# Patient Record
Sex: Male | Born: 1950 | Race: White | Hispanic: No | Marital: Married | Smoking: Former smoker
Health system: Southern US, Community
[De-identification: ages and names within clinical notes are randomized; demographics above are authoritative.]

## PROBLEM LIST (undated history)

## (undated) DIAGNOSIS — T8859XA Other complications of anesthesia, initial encounter: Secondary | ICD-10-CM

## (undated) DIAGNOSIS — E785 Hyperlipidemia, unspecified: Secondary | ICD-10-CM

## (undated) DIAGNOSIS — I4891 Unspecified atrial fibrillation: Secondary | ICD-10-CM

## (undated) DIAGNOSIS — L400 Psoriasis vulgaris: Secondary | ICD-10-CM

## (undated) DIAGNOSIS — C801 Malignant (primary) neoplasm, unspecified: Secondary | ICD-10-CM

## (undated) DIAGNOSIS — R972 Elevated prostate specific antigen [PSA]: Secondary | ICD-10-CM

## (undated) DIAGNOSIS — G4730 Sleep apnea, unspecified: Secondary | ICD-10-CM

## (undated) DIAGNOSIS — N9989 Other postprocedural complications and disorders of genitourinary system: Secondary | ICD-10-CM

## (undated) DIAGNOSIS — R338 Other retention of urine: Secondary | ICD-10-CM

## (undated) DIAGNOSIS — D649 Anemia, unspecified: Secondary | ICD-10-CM

## (undated) DIAGNOSIS — T4145XA Adverse effect of unspecified anesthetic, initial encounter: Secondary | ICD-10-CM

## (undated) DIAGNOSIS — K219 Gastro-esophageal reflux disease without esophagitis: Secondary | ICD-10-CM

## (undated) DIAGNOSIS — I10 Essential (primary) hypertension: Secondary | ICD-10-CM

## (undated) DIAGNOSIS — I499 Cardiac arrhythmia, unspecified: Secondary | ICD-10-CM

## (undated) DIAGNOSIS — M1990 Unspecified osteoarthritis, unspecified site: Secondary | ICD-10-CM

## (undated) HISTORY — PX: LEG SURGERY: SHX1003

## (undated) HISTORY — PX: HEMORRHOID SURGERY: SHX153

## (undated) HISTORY — PX: PROSTATE BIOPSY: SHX241

## (undated) HISTORY — PX: ELBOW SURGERY: SHX618

## (undated) HISTORY — DX: Essential (primary) hypertension: I10

## (undated) HISTORY — DX: Hyperlipidemia, unspecified: E78.5

## (undated) HISTORY — DX: Psoriasis vulgaris: L40.0

## (undated) HISTORY — PX: KNEE SURGERY: SHX244

## (undated) HISTORY — DX: Sleep apnea, unspecified: G47.30

---
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
## 1999-05-19 ENCOUNTER — Encounter: Payer: Self-pay | Admitting: General Surgery

## 1999-05-22 ENCOUNTER — Observation Stay (HOSPITAL_COMMUNITY): Admission: RE | Admit: 1999-05-22 | Discharge: 1999-05-25 | Payer: Self-pay | Admitting: General Surgery

## 2003-05-29 ENCOUNTER — Emergency Department (HOSPITAL_COMMUNITY): Admission: EM | Admit: 2003-05-29 | Discharge: 2003-05-29 | Payer: Self-pay | Admitting: Emergency Medicine

## 2011-03-28 ENCOUNTER — Ambulatory Visit (INDEPENDENT_AMBULATORY_CARE_PROVIDER_SITE_OTHER): Payer: BC Managed Care – PPO

## 2011-03-28 DIAGNOSIS — J069 Acute upper respiratory infection, unspecified: Secondary | ICD-10-CM

## 2012-06-18 ENCOUNTER — Ambulatory Visit (INDEPENDENT_AMBULATORY_CARE_PROVIDER_SITE_OTHER): Payer: BC Managed Care – PPO | Admitting: Internal Medicine

## 2012-06-18 VITALS — BP 133/86 | HR 98 | Temp 98.3°F | Resp 18 | Ht 68.5 in | Wt 192.0 lb

## 2012-06-18 DIAGNOSIS — R6883 Chills (without fever): Secondary | ICD-10-CM

## 2012-06-18 DIAGNOSIS — J09X2 Influenza due to identified novel influenza A virus with other respiratory manifestations: Secondary | ICD-10-CM

## 2012-06-18 DIAGNOSIS — R52 Pain, unspecified: Secondary | ICD-10-CM

## 2012-06-18 DIAGNOSIS — I10 Essential (primary) hypertension: Secondary | ICD-10-CM | POA: Insufficient documentation

## 2012-06-18 LAB — POCT INFLUENZA A/B
Influenza A, POC: POSITIVE
Influenza B, POC: NEGATIVE

## 2012-06-18 MED ORDER — HYDROCODONE-ACETAMINOPHEN 7.5-325 MG/15ML PO SOLN: 5.0000 mL | Freq: Four times a day (QID) | ORAL | Status: DC | PRN

## 2012-06-18 MED ORDER — OSELTAMIVIR PHOSPHATE 75 MG PO CAPS: 75.0000 mg | ORAL_CAPSULE | Freq: Two times a day (BID) | ORAL | Status: DC

## 2012-06-18 NOTE — Progress Notes (Signed)
  Subjective:    Patient ID: Philip Jackson, male    DOB: 06-03-1950, 62 y.o.   MRN: [id number]  HPI Sudden onset of fever, body aches, painful cough. Has had flu shot. No sputum production yet. No sob, does have cp with cough.   Review of Systems HTN, lipid disorder    Objective:   Physical Exam  Vitals reviewed. Constitutional: He is oriented to person, place, and time. He appears well-developed and well-nourished. No distress.  HENT:  Right Ear: External ear normal. Tympanic membrane is retracted. No decreased hearing is noted.  Left Ear: External ear normal. Tympanic membrane is not retracted. No decreased hearing is noted.  Nose: Mucosal edema and rhinorrhea present. Right sinus exhibits no maxillary sinus tenderness and no frontal sinus tenderness. Left sinus exhibits no maxillary sinus tenderness and no frontal sinus tenderness.  Mouth/Throat: Oropharynx is clear and moist.  Cardiovascular: Regular rhythm and normal heart sounds.  Tachycardia present.   Pulmonary/Chest: Effort normal and breath sounds normal. No respiratory distress. He has no wheezes. He has no rales. He exhibits no tenderness.  Neurological: He is alert and oriented to person, place, and time. No cranial nerve deficit. He exhibits normal muscle tone. Coordination normal.  Psychiatric: He has a normal mood and affect.   Results for orders placed in visit on 06/18/12  POCT INFLUENZA A/B      Result Value Range   Influenza A, POC Positive     Influenza B, POC Negative           Assessment & Plan:  Tamiflu/Lortab elix

## 2012-06-18 NOTE — Patient Instructions (Addendum)
Influenza, Adult Influenza ("the flu") is a viral infection of the respiratory tract. It occurs more often in winter months because people spend more time in close contact with one another. Influenza can make you feel very sick. Influenza easily spreads from person to person (contagious). CAUSES   Influenza is caused by a virus that infects the respiratory tract. You can catch the virus by breathing in droplets from an infected person's cough or sneeze. You can also catch the virus by touching something that was recently contaminated with the virus and then touching your mouth, nose, or eyes. SYMPTOMS   Symptoms typically last 4 to 10 days and may include:  Fever.   Chills.   Headache, body aches, and muscle aches.   Sore throat.   Chest discomfort and cough.   Poor appetite.   Weakness or feeling tired.   Dizziness.   Nausea or vomiting.  DIAGNOSIS   Diagnosis of influenza is often made based on your history and a physical exam. A nose or throat swab test can be done to confirm the diagnosis. RISKS AND COMPLICATIONS You may be at risk for a more severe case of influenza if you smoke cigarettes, have diabetes, have chronic heart disease (such as heart failure) or lung disease (such as asthma), or if you have a weakened immune system. Elderly people and pregnant women are also at risk for more serious infections. The most common complication of influenza is a lung infection (pneumonia). Sometimes, this complication can require emergency medical care and may be life-threatening. PREVENTION   An annual influenza vaccination (flu shot) is the best way to avoid getting influenza. An annual flu shot is now routinely recommended for all adults in the [place] TREATMENT   In mild cases, influenza goes away on its own. Treatment is directed at relieving symptoms. For more severe cases, your caregiver may prescribe antiviral medicines to shorten the sickness. Antibiotic medicines are not effective,  because the infection is caused by a virus, not by bacteria. HOME CARE INSTRUCTIONS  Only take over-the-counter or prescription medicines for pain, discomfort, or fever as directed by your caregiver.   Use a cool mist humidifier to make breathing easier.   Get plenty of rest until your temperature returns to normal. This usually takes 3 to 4 days.   Drink enough fluids to keep your urine clear or pale yellow.   Cover your mouth and nose when coughing or sneezing, and wash your hands well to avoid spreading the virus.   Stay home from work or school until your fever has been gone for at least 1 full day.  SEEK MEDICAL CARE IF:    You have chest pain or a deep cough that worsens or produces more mucus.   You have nausea, vomiting, or diarrhea.  SEEK IMMEDIATE MEDICAL CARE IF:    You have difficulty breathing, shortness of breath, or your skin or nails turn bluish.   You have severe neck pain or stiffness.   You have a severe headache, facial pain, or earache.   You have a worsening or recurring fever.   You have nausea or vomiting that cannot be controlled.  MAKE SURE YOU:  Understand these instructions.   Will watch your condition.   Will get help right away if you are not doing well or get worse.  Document Released: 04/02/2000 Document Revised: 10/05/2011 Document Reviewed: 07/05/2011 [employer] Patient Information 2013 [employer].    

## 2012-11-30 ENCOUNTER — Ambulatory Visit (INDEPENDENT_AMBULATORY_CARE_PROVIDER_SITE_OTHER): Payer: BC Managed Care – PPO | Admitting: Internal Medicine

## 2012-11-30 ENCOUNTER — Ambulatory Visit: Payer: BC Managed Care – PPO

## 2012-11-30 VITALS — BP 124/72 | HR 64 | Temp 98.0°F | Resp 18 | Ht 69.0 in | Wt 193.0 lb

## 2012-11-30 DIAGNOSIS — M25551 Pain in right hip: Secondary | ICD-10-CM

## 2012-11-30 DIAGNOSIS — M25559 Pain in unspecified hip: Secondary | ICD-10-CM

## 2012-11-30 MED ORDER — METHOCARBAMOL 750 MG PO TABS: 750.0000 mg | ORAL_TABLET | Freq: Four times a day (QID) | ORAL | Status: DC

## 2012-11-30 NOTE — Patient Instructions (Signed)
Hip Pain The hips join the upper legs to the lower pelvis. The bones, cartilage, tendons, and muscles of the hip joint perform a lot of work each day holding your body weight and allowing you to move around. Hip pain is a common symptom. It can range from a minor ache to severe pain on 1 or both hips. Pain may be felt on the inside of the hip joint near the groin, or the outside near the buttocks and upper thigh. There may be swelling or stiffness as well. It occurs more often when a person walks or performs activity. There are many reasons hip pain can develop. CAUSES  It is important to work with your caregiver to identify the cause since many conditions can impact the bones, cartilage, muscles, and tendons of the hips. Causes for hip pain include:  Broken (fractured) bones.  Separation of the thighbone from the hip socket (dislocation).  Torn cartilage of the hip joint.  Swelling (inflammation) of a tendon (tendonitis), the sac within the hip joint (bursitis), or a joint.  A weakening in the abdominal wall (hernia), affecting the nerves to the hip.  Arthritis in the hip joint or lining of the hip joint.  Pinched nerves in the back, hip, or upper thigh.  A bulging disc in the spine (herniated disc).  Rarely, bone infection or cancer. DIAGNOSIS  The location of your hip pain will help your caregiver understand what may be causing the pain. A diagnosis is based on your medical history, your symptoms, results from your physical exam, and results from diagnostic tests. Diagnostic tests may include X-ray exams, a computerized magnetic scan (magnetic resonance imaging, MRI), or bone scan. TREATMENT  Treatment will depend on the cause of your hip pain. Treatment may include:  Limiting activities and resting until symptoms improve.  Crutches or other walking supports (a cane or brace).  Ice, elevation, and compression.  Physical therapy or home exercises.  Shoe inserts or special  shoes.  Losing weight.  Medications to reduce pain.  Undergoing surgery. HOME CARE INSTRUCTIONS   Only take over-the-counter or prescription medicines for pain, discomfort, or fever as directed by your caregiver.  Put ice on the injured area:  Put ice in a plastic bag.  Place a towel between your skin and the bag.  Leave the ice on for 15-20 minutes at a time, 3-4 times a day.  Keep your leg raised (elevated) when possible to lessen swelling.  Avoid activities that cause pain.  Follow specific exercises as directed by your caregiver.  Sleep with a pillow between your legs on your most comfortable side.  Record how often you have hip pain, the location of the pain, and what it feels like. This information may be helpful to you and your caregiver.  Ask your caregiver about returning to work or sports and whether you should drive.  Follow up with your caregiver for further exams, therapy, or testing as directed. SEEK MEDICAL CARE IF:   Your pain or swelling continues or worsens after 1 week.  You are feeling unwell or have chills.  You have increasing difficulty with walking.  You have a loss of sensation or other new symptoms.  You have questions or concerns. SEEK IMMEDIATE MEDICAL CARE IF:   You cannot put weight on the affected hip.  You have fallen.  You have a sudden increase in pain and swelling in your hip.  You have a fever. MAKE SURE YOU:   Understand these instructions.  Will watch your condition.  Will get help right away if you are not doing well or get worse. Document Released: 09/23/2009 Document Revised: 06/28/2011 Document Reviewed: 09/23/2009 [facility] Patient Information 2014 [place], [place]. Hip Pointer (Iliac Crest Contusion) Direct hit (trauma) to the hip bone in the front and side of the body (ilium) may cause hip pointer. This is also called iliac crest contusion. Hip pointer is bruising (contusion) of the skin and underlying tissues of  the hip bone. A bruise results from injury to blood vessels, which bleed into the surrounding soft tissues. This gives a "black and blue" look. Since the upper hip bone has little protective covering, it is vulnerable to injury. SYMPTOMS   Swelling, pain, and tenderness of the hip bone.  Pain, often worse the day after injury.  Feeling of firmness when pressure is placed on the injury site.  Discoloration under the skin (redness, turning to "black and blue" or purplish color).  Sometimes, pain with walking or inability to walk. CAUSES  Direct force (trauma) to the hip bone. This may result from collision with another player or the playing surface (i.e. grass, court, ice, sideboard). RISK INCREASES WITH:  Contact or collision sports (i.e. football, hockey, soccer).  Poor protection of exposed areas, during contact or collision sports.  Bleeding disorder or use of blood thinners (anticoagulants), aspirin, or non-steroidal anti-inflammatory medicines (aspirin and ibuprofen). PREVENTION   Wear properly fitted and padded protective equipment.  Limit use of blood thinners, aspirin, and ibuprofen. PROGNOSIS  Hip pointer usually goes away within 2 weeks, with proper treatment. RELATED COMPLICATIONS   Too much bleeding, leading to prolonged impairment.  Infection (uncommon).  Hip stiffness.  Delayed healing or resolution of symptoms, especially if activity is resumed too soon.  Inflammation of a sac between the bone and tendon (bursitis). TREATMENT  Treatment first involves ice and medicine to reduce pain and inflammation. Heat, massage, and physical therapy should be delayed for about 48 hours after injury. Rest, and avoid further injury, to allow the tissue to heal. Physical therapy may be advised. This can be competed at home or with a therapist. Once you return to activity, you may want to add padding in the area, to prevent injury. On rare occasions, contusion will require surgery  or aspiration (removal of fluids with a needle). MEDICATION   If pain medicine is needed, nonsteroidal anti-inflammatory medicines (aspirin and ibuprofen), or other minor pain relievers (acetaminophen), are often advised.  Do not take pain medicine within 48 hours of injury or for 7 days before surgery.  Prescription pain relievers may be given. Use only as directed and only as much as you need.  Ointments applied to the skin may help.  Corticosteroid injections may be given to reduce inflammation, but are not common for this injury. HEAT AND COLD  Cold treatment (icing) should be applied for 10 to 15 minutes every 2 to 3 hours for inflammation and pain, and immediately after activity that aggravates your symptoms. Use ice packs or an ice massage.  Heat treatment may be used before performing stretching and strengthening activities prescribed by your caregiver, physical therapist, or athletic trainer. Use a heat pack or a warm water soak. SEEK MEDICAL CARE IF:   Symptoms get worse or do not improve in 2 weeks, despite treatment.  New, unexplained symptoms develop. (Drugs used in treatment may produce side effects.) Document Released: 04/05/2005 Document Revised: 06/28/2011 Document Reviewed: 07/18/2008 [facility] Patient Information 2014 [place], [place].

## 2012-11-30 NOTE — Progress Notes (Signed)
  Subjective:    Patient ID: Philip Jackson, male    DOB: 01-26-1951, 62 y.o.   MRN: [id number]  HPI Climbing out of truck and felt sharp pain right hip/pelvis, no other trauma.Pain is localized superficial and not deep. Able to walk but right leg will give way with the pain. No distal numbness, weakness or incontinence. No urinary sxs or fever.   Review of Systems     Objective:   Physical Exam  Vitals reviewed. Constitutional: He is oriented to person, place, and time. He appears well-developed and well-nourished. He appears distressed.  HENT:  Head: Normocephalic.  Eyes: EOM are normal.  Neck: Neck supple.  Cardiovascular: Normal rate.   Pulmonary/Chest: Effort normal.  Musculoskeletal: He exhibits tenderness.       Right hip: He exhibits tenderness and bony tenderness. He exhibits normal range of motion, normal strength, no swelling, no crepitus, no deformity and no laceration.       Legs: Neurological: He is alert and oriented to person, place, and time. He exhibits normal muscle tone. Coordination normal.    [facility] reading (PRIMARY) by  Dr Perrin Maltese no injury noted        Assessment & Plan:  RICE Robaxin

## 2014-06-05 ENCOUNTER — Other Ambulatory Visit: Payer: Self-pay | Admitting: Gastroenterology

## 2014-06-07 ENCOUNTER — Other Ambulatory Visit: Payer: Self-pay | Admitting: Gastroenterology

## 2014-06-11 ENCOUNTER — Encounter (HOSPITAL_COMMUNITY): Payer: Self-pay | Admitting: *Deleted

## 2014-06-17 ENCOUNTER — Encounter (HOSPITAL_COMMUNITY)
Admission: RE | Disposition: A | Discharge status: Home / Self Care | Payer: Self-pay | Source: Ambulatory Visit | Attending: Gastroenterology

## 2014-06-17 ENCOUNTER — Emergency Department (HOSPITAL_COMMUNITY): Payer: BLUE CROSS/BLUE SHIELD

## 2014-06-17 ENCOUNTER — Ambulatory Visit (HOSPITAL_COMMUNITY): Payer: BLUE CROSS/BLUE SHIELD | Admitting: Anesthesiology

## 2014-06-17 ENCOUNTER — Emergency Department (HOSPITAL_COMMUNITY)
Admission: EM | Admit: 2014-06-17 | Discharge: 2014-06-17 | Disposition: A | Discharge status: Home / Self Care | Payer: BLUE CROSS/BLUE SHIELD | Attending: Emergency Medicine | Admitting: Emergency Medicine

## 2014-06-17 ENCOUNTER — Telehealth: Payer: Self-pay | Admitting: Interventional Cardiology

## 2014-06-17 ENCOUNTER — Encounter (HOSPITAL_COMMUNITY): Payer: Self-pay | Admitting: *Deleted

## 2014-06-17 ENCOUNTER — Emergency Department (HOSPITAL_COMMUNITY)
Admission: EM | Admit: 2014-06-17 | Discharge: 2014-06-17 | Discharge status: Absent without leave | Payer: BLUE CROSS/BLUE SHIELD

## 2014-06-17 ENCOUNTER — Ambulatory Visit (HOSPITAL_COMMUNITY)
Admission: RE | Admit: 2014-06-17 | Discharge: 2014-06-17 | Disposition: A | Discharge status: Home / Self Care | Payer: BLUE CROSS/BLUE SHIELD | Source: Ambulatory Visit | Attending: Gastroenterology | Admitting: Gastroenterology

## 2014-06-17 ENCOUNTER — Other Ambulatory Visit: Payer: Self-pay | Admitting: Physician Assistant

## 2014-06-17 DIAGNOSIS — Z79899 Other long term (current) drug therapy: Secondary | ICD-10-CM | POA: Insufficient documentation

## 2014-06-17 DIAGNOSIS — K219 Gastro-esophageal reflux disease without esophagitis: Secondary | ICD-10-CM | POA: Diagnosis not present

## 2014-06-17 DIAGNOSIS — Z87820 Personal history of traumatic brain injury: Secondary | ICD-10-CM | POA: Insufficient documentation

## 2014-06-17 DIAGNOSIS — I10 Essential (primary) hypertension: Secondary | ICD-10-CM | POA: Diagnosis not present

## 2014-06-17 DIAGNOSIS — I4891 Unspecified atrial fibrillation: Secondary | ICD-10-CM

## 2014-06-17 DIAGNOSIS — Z008 Encounter for other general examination: Secondary | ICD-10-CM

## 2014-06-17 DIAGNOSIS — I480 Paroxysmal atrial fibrillation: Secondary | ICD-10-CM

## 2014-06-17 DIAGNOSIS — Z7982 Long term (current) use of aspirin: Secondary | ICD-10-CM | POA: Insufficient documentation

## 2014-06-17 DIAGNOSIS — D649 Anemia, unspecified: Secondary | ICD-10-CM | POA: Diagnosis not present

## 2014-06-17 DIAGNOSIS — Z0000 Encounter for general adult medical examination without abnormal findings: Secondary | ICD-10-CM | POA: Insufficient documentation

## 2014-06-17 DIAGNOSIS — D509 Iron deficiency anemia, unspecified: Secondary | ICD-10-CM | POA: Insufficient documentation

## 2014-06-17 DIAGNOSIS — Z538 Procedure and treatment not carried out for other reasons: Secondary | ICD-10-CM | POA: Diagnosis not present

## 2014-06-17 DIAGNOSIS — E780 Pure hypercholesterolemia: Secondary | ICD-10-CM | POA: Insufficient documentation

## 2014-06-17 DIAGNOSIS — I471 Supraventricular tachycardia: Secondary | ICD-10-CM | POA: Diagnosis present

## 2014-06-17 DIAGNOSIS — R000 Tachycardia, unspecified: Secondary | ICD-10-CM | POA: Diagnosis present

## 2014-06-17 DIAGNOSIS — Z87891 Personal history of nicotine dependence: Secondary | ICD-10-CM | POA: Insufficient documentation

## 2014-06-17 DIAGNOSIS — E785 Hyperlipidemia, unspecified: Secondary | ICD-10-CM | POA: Diagnosis present

## 2014-06-17 HISTORY — DX: Gastro-esophageal reflux disease without esophagitis: K21.9

## 2014-06-17 HISTORY — DX: Anemia, unspecified: D64.9

## 2014-06-17 LAB — CBC WITH DIFFERENTIAL/PLATELET
BASOS ABS: 0 10*3/uL (ref 0.0–0.1)
BASOS PCT: 0 % (ref 0–1)
Eosinophils Absolute: 0.1 10*3/uL (ref 0.0–0.7)
Eosinophils Relative: 2 % (ref 0–5)
HCT: 44.8 % (ref 39.0–52.0)
HEMOGLOBIN: 14.2 g/dL (ref 13.0–17.0)
LYMPHS PCT: 26 % (ref 12–46)
Lymphs Abs: 2 10*3/uL (ref 0.7–4.0)
MCH: 25.9 pg — ABNORMAL LOW (ref 26.0–34.0)
MCHC: 31.7 g/dL (ref 30.0–36.0)
MCV: 81.6 fL (ref 78.0–100.0)
Monocytes Absolute: 0.8 10*3/uL (ref 0.1–1.0)
Monocytes Relative: 11 % (ref 3–12)
NEUTROS ABS: 4.7 10*3/uL (ref 1.7–7.7)
NEUTROS PCT: 61 % (ref 43–77)
PLATELETS: 287 10*3/uL (ref 150–400)
RBC: 5.49 MIL/uL (ref 4.22–5.81)
RDW: 19.8 % — AB (ref 11.5–15.5)
WBC: 7.7 10*3/uL (ref 4.0–10.5)

## 2014-06-17 LAB — BASIC METABOLIC PANEL
ANION GAP: 9 (ref 5–15)
BUN: 13 mg/dL (ref 6–23)
CALCIUM: 8.8 mg/dL (ref 8.4–10.5)
CO2: 20 mmol/L (ref 19–32)
Chloride: 109 mmol/L (ref 96–112)
Creatinine, Ser: 0.9 mg/dL (ref 0.50–1.35)
GFR calc Af Amer: >90 mL/min (ref >90)
GFR calc non Af Amer: 89 mL/min — ABNORMAL LOW (ref >90)
GLUCOSE: 107 mg/dL — AB (ref 70–99)
POTASSIUM: 3.9 mmol/L (ref 3.5–5.1)
Sodium: 138 mmol/L (ref 135–145)

## 2014-06-17 LAB — I-STAT TROPONIN, ED: Troponin i, poc: 0.01 ng/mL (ref 0.00–0.08)

## 2014-06-17 SURGERY — Surgical Case

## 2014-06-17 MED ORDER — ASPIRIN 81 MG PO CHEW: 81.0000 mg | CHEWABLE_TABLET | Freq: Every day | ORAL | Status: DC

## 2014-06-17 MED ORDER — DILTIAZEM HCL ER COATED BEADS 120 MG PO CP24
120.0000 mg | ORAL_CAPSULE | Freq: Once | ORAL | Status: AC
  Administered 2014-06-17: 120 mg via ORAL
  Filled 2014-06-17: qty 1

## 2014-06-17 MED ORDER — DILTIAZEM HCL 100 MG IV SOLR
5.0000 mg/h | INTRAVENOUS | Status: DC
  Administered 2014-06-17: 5 mg/h via INTRAVENOUS
  Filled 2014-06-17: qty 100

## 2014-06-17 MED ORDER — DILTIAZEM HCL ER COATED BEADS 240 MG PO CP24: 240.0000 mg | ORAL_CAPSULE | Freq: Every day | ORAL | Status: DC

## 2014-06-17 MED ORDER — DILTIAZEM HCL ER COATED BEADS 120 MG PO CP24: 240.0000 mg | ORAL_CAPSULE | Freq: Every day | ORAL | Status: DC

## 2014-06-17 MED ORDER — APIXABAN 5 MG PO TABS
5.0000 mg | ORAL_TABLET | Freq: Two times a day (BID) | ORAL | Status: DC
  Filled 2014-06-17 (×2): qty 1

## 2014-06-17 MED ORDER — PROPOFOL 10 MG/ML IV BOLUS
INTRAVENOUS | Status: AC
  Filled 2014-06-17: qty 20

## 2014-06-17 MED ORDER — LACTATED RINGERS IV SOLN: INTRAVENOUS | Status: DC

## 2014-06-17 MED ORDER — PROPOFOL 10 MG/ML IV BOLUS
INTRAVENOUS | Status: AC
  Filled 2014-06-17: qty 60

## 2014-06-17 MED ORDER — DILTIAZEM HCL 60 MG PO TABS
120.0000 mg | ORAL_TABLET | Freq: Once | ORAL | Status: AC
Start: 2014-06-17 — End: 2014-06-17
  Administered 2014-06-17: 120 mg via ORAL
  Filled 2014-06-17: qty 2

## 2014-06-17 MED ORDER — PROPOFOL 10 MG/ML IV BOLUS
INTRAVENOUS | Status: AC
  Filled 2014-06-17: qty 40

## 2014-06-17 MED ORDER — SODIUM CHLORIDE 0.9 % IV SOLN: INTRAVENOUS | Status: DC

## 2014-06-17 MED ORDER — SODIUM CHLORIDE 0.9 % IV SOLN
Freq: Once | INTRAVENOUS | Status: AC
  Administered 2014-06-17: 500 mL via INTRAVENOUS

## 2014-06-17 MED ORDER — DILTIAZEM LOAD VIA INFUSION
20.0000 mg | Freq: Once | INTRAVENOUS | Status: AC
  Administered 2014-06-17: 20 mg via INTRAVENOUS
  Filled 2014-06-17: qty 20

## 2014-06-17 MED ORDER — DILTIAZEM HCL ER COATED BEADS 120 MG PO CP24: 120.0000 mg | ORAL_CAPSULE | Freq: Every day | ORAL | Status: DC

## 2014-06-17 SURGICAL SUPPLY — 24 items

## 2014-06-17 NOTE — Progress Notes (Signed)
Problem: New onset, asymptomatic atrial fibrillation.  History: The patient was scheduled to undergo a diagnostic esophagogastroduodenoscopy and colonoscopy this morning to evaluate iron deficiency anemia (hemoglobin 12.4 g, serum ferritin 13.3 ng/mL).  He was placed on the cardiac monitor which showed atrial fibrillation. There is no past history of cardiac disease or atrial fibrillation. Despite being in atrial fibrillation, the patient felt well.  Diagnostic esophagogastroduodenoscopy and colonoscopy were canceled.  The patient was referred to the [facility] emergency department for evaluation of his new onset atrial fibrillation.

## 2014-06-17 NOTE — Telephone Encounter (Signed)
New onset atrial fibrillation of unknown duration in [facility]. Converted spontaneously. Needs 30 day monitor, 2 D doppler echo, and sleep study scheduled.

## 2014-06-17 NOTE — Telephone Encounter (Signed)
Pt echo and monitor previously scheduled. Order in [place] for sleep study, message sent to [facility] to initiate scheduling

## 2014-06-17 NOTE — ED Notes (Signed)
Patient was in endoscopy for a scheduled colonoscopy and was found to be tachycardic and in atrial fib when he was placed on the monitor. He was then sent from endoscopy to the ED.

## 2014-06-17 NOTE — Discharge Instructions (Signed)
Please take your new medications as directed: 1. Diltiazem 240 mg daily 2. Baby Aspirin 81 mg daily  Please follow-up with cardiology ASAP.  Call them today.  Atrial Fibrillation Atrial fibrillation is a type of irregular heart rhythm (arrhythmia). During atrial fibrillation, the upper chambers of the heart (atria) quiver continuously in a chaotic pattern. This causes an irregular and often rapid heart rate.  Atrial fibrillation is the result of the heart becoming overloaded with disorganized signals that tell it to beat. These signals are normally released one at a time by a part of the right atrium called the sinoatrial node. They then travel from the atria to the lower chambers of the heart (ventricles), causing the atria and ventricles to contract and pump blood as they pass. In atrial fibrillation, parts of the atria outside of the sinoatrial node also release these signals. This results in two problems. First, the atria receive so many signals that they do not have time to fully contract. Second, the ventricles, which can only receive one signal at a time, beat irregularly and out of rhythm with the atria.  There are three types of atrial fibrillation:   Paroxysmal. Paroxysmal atrial fibrillation starts suddenly and stops on its own within a week.  Persistent. Persistent atrial fibrillation lasts for more than a week. It may stop on its own or with treatment.  Permanent. Permanent atrial fibrillation does not go away. Episodes of atrial fibrillation may lead to permanent atrial fibrillation. Atrial fibrillation can prevent your heart from pumping blood normally. It increases your risk of stroke and can lead to heart failure.  CAUSES   Heart conditions, including a heart attack, heart failure, coronary artery disease, and heart valve conditions.   Inflammation of the sac that surrounds the heart (pericarditis).  Blockage of an artery in the lungs (pulmonary embolism).  Pneumonia or  other infections.  Chronic lung disease.  Thyroid problems, especially if the thyroid is overactive (hyperthyroidism).  Caffeine, excessive alcohol use, and use of some illegal drugs.   Use of some medicines, including certain decongestants and diet pills.  Heart surgery.   Birth defects.  Sometimes, no cause can be found. When this happens, the atrial fibrillation is called lone atrial fibrillation. The risk of complications from atrial fibrillation increases if you have lone atrial fibrillation and you are age 38 years or older. RISK FACTORS  Heart failure.  Coronary artery disease.  Diabetes mellitus.   High blood pressure (hypertension).   Obesity.   Other arrhythmias.   Increased age. SIGNS AND SYMPTOMS   A feeling that your heart is beating rapidly or irregularly.   A feeling of discomfort or pain in your chest.   Shortness of breath.   Sudden light-headedness or weakness.   Getting tired easily when exercising.   Urinating more often than normal (mainly when atrial fibrillation first begins).  In paroxysmal atrial fibrillation, symptoms may start and suddenly stop. DIAGNOSIS  Your health care provider may be able to detect atrial fibrillation when taking your pulse. Your health care provider may have you take a test called an ambulatory electrocardiogram (ECG). An ECG records your heartbeat patterns over a 24-hour period. You may also have other tests, such as:  Transthoracic echocardiogram (TTE). During echocardiography, sound waves are used to evaluate how blood flows through your heart.  Transesophageal echocardiogram (TEE).  Stress test. There is more than one type of stress test. If a stress test is needed, ask your health care provider about which type  is best for you.  Chest X-ray exam.  Blood tests.  Computed tomography (CT). TREATMENT  Treatment may include:  Treating any underlying conditions. For example, if you have an  overactive thyroid, treating the condition may correct atrial fibrillation.  Taking medicine. Medicines may be given to control a rapid heart rate or to prevent blood clots, heart failure, or a stroke.  Having a procedure to correct the rhythm of the heart:  Electrical cardioversion. During electrical cardioversion, a controlled, low-energy shock is delivered to the heart through your skin. If you have chest pain, very low blood pressure, or sudden heart failure, this procedure may need to be done as an emergency.  Catheter ablation. During this procedure, heart tissues that send the signals that cause atrial fibrillation are destroyed.  Surgical ablation. During this surgery, thin lines of heart tissue that carry the abnormal signals are destroyed. This procedure can either be an open-heart surgery or a minimally invasive surgery. With the minimally invasive surgery, small cuts are made to access the heart instead of a large opening.  Pulmonary venous isolation. During this surgery, tissue around the veins that carry blood from the lungs (pulmonary veins) is destroyed. This tissue is thought to carry the abnormal signals. HOME CARE INSTRUCTIONS   Take medicines only as directed by your health care provider. Some medicines can make atrial fibrillation worse or recur.  If blood thinners were prescribed by your health care provider, take them exactly as directed. Too much blood-thinning medicine can cause bleeding. If you take too little, you will not have the needed protection against stroke and other problems.  Perform blood tests at home if directed by your health care provider. Perform blood tests exactly as directed.  Quit smoking if you smoke.  Do not drink alcohol.  Do not drink caffeinated beverages such as coffee, soda, and some teas. You may drink decaffeinated coffee, soda, or tea.   Maintain a healthy weight.Do not use diet pills unless your health care provider approves. They  may make heart problems worse.   Follow diet instructions as directed by your health care provider.  Exercise regularly as directed by your health care provider.  Keep all follow-up visits as directed by your health care provider. This is important. PREVENTION  The following substances can cause atrial fibrillation to recur:   Caffeinated beverages.  Alcohol.  Certain medicines, especially those used for breathing problems.  Certain herbs and herbal medicines, such as those containing ephedra or ginseng.  Illegal drugs, such as cocaine and amphetamines. Sometimes medicines are given to prevent atrial fibrillation from recurring. Proper treatment of any underlying condition is also important in helping prevent recurrence.  SEEK MEDICAL CARE IF:  You notice a change in the rate, rhythm, or strength of your heartbeat.  You suddenly begin urinating more frequently.  You tire more easily when exerting yourself or exercising. SEEK IMMEDIATE MEDICAL CARE IF:   You have chest pain, abdominal pain, sweating, or weakness.  You feel nauseous.  You have shortness of breath.  You suddenly have swollen feet and ankles.  You feel dizzy.  Your face or limbs feel numb or weak.  You have a change in your vision or speech. MAKE SURE YOU:   Understand these instructions.  Will watch your condition.  Will get help right away if you are not doing well or get worse. Document Released: 04/05/2005 Document Revised: 08/20/2013 Document Reviewed: 05/16/2012 [facility] Patient Information 2015 [place], [place]. This information is not intended to  replace advice given to you by your health care provider. Make sure you discuss any questions you have with your health care provider. ° °

## 2014-06-17 NOTE — Progress Notes (Signed)
Patient in atrial fibrillation procedure canceled,taken to ed for evaluation

## 2014-06-17 NOTE — H&P (Signed)
  Problem: Iron deficiency anemia. Hemoglobin 12.4 g. Ferritin 13.3 ng/mL. Chronic aspirin use. Frequent blood donation to the [facility]. Normal screening colonoscopy performed on 11/26/2005  History: The patient is a 64 year old male born [birth year]. He is scheduled to undergo diagnostic esophagogastroduodenoscopy and colonoscopy to evaluate iron deficiency anemia based on a slightly low hemoglobin and low serum ferritin. He underwent a normal screening colonoscopy on 11/26/2005. He is a regular donated her of blood to the [facility]. He chronically takes aspirin 81 mg daily. He has chronic heartburn treated with omeprazole with good success. He reports no dysphagia or odynophagia. He denies gastrointestinal bleeding.  Chronic medications: Aspirin. Amlodipine. Vesicare. Pravastatin. Omeprazole. Valsartan. Oral vitamin B 12.  Past medical history: Hypertension. Gastroesophageal reflux. Allergic rhinitis. Hypercholesterolemia. Psoriasis. Urinary bladder spasm. No previous surgery.  Exam: The patient is alert and lying comfortably on the endoscopy stretcher. Abdomen is soft and nontender to palpation. Lungs are clear to auscultation. Cardiac exam reveals a regular rhythm.  Assessment: Iron deficiency anemia secondary to chronic blood donation to the [facility], impaired iron absorption due to chronic omeprazole, and chronic gastrointestinal bleeding secondary to chronic aspirin use.  Plan: Proceed with diagnostic esophagogastroduodenoscopy and colonoscopy.

## 2014-06-17 NOTE — ED Notes (Signed)
Bed: WA16 Expected date:  Expected time:  Means of arrival:  Comments: RES A 

## 2014-06-17 NOTE — ED Notes (Signed)
Patient transported to X-ray 
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
## 2014-06-17 NOTE — ED Notes (Signed)
MD at bedside. 

## 2014-06-17 NOTE — ED Notes (Signed)
Pt sent from endoscopy. Sent for new onset of a fib
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
## 2014-06-20 ENCOUNTER — Encounter: Payer: Self-pay | Admitting: Radiology

## 2014-06-20 ENCOUNTER — Ambulatory Visit (HOSPITAL_COMMUNITY): Payer: BLUE CROSS/BLUE SHIELD | Attending: Internal Medicine | Admitting: Radiology

## 2014-06-20 ENCOUNTER — Encounter (INDEPENDENT_AMBULATORY_CARE_PROVIDER_SITE_OTHER): Payer: BLUE CROSS/BLUE SHIELD

## 2014-06-20 DIAGNOSIS — I4891 Unspecified atrial fibrillation: Secondary | ICD-10-CM

## 2014-06-20 NOTE — Progress Notes (Signed)
Echocardiogram performed.  

## 2014-06-20 NOTE — Progress Notes (Signed)
[employer] 30 day monitor applied. EOS 07-20-14

## 2014-06-24 ENCOUNTER — Telehealth: Payer: Self-pay | Admitting: *Deleted

## 2014-06-24 NOTE — Telephone Encounter (Signed)
[employer] faxed monitor strips for review (06/23/14 11:53 pm, AFib [place]). Called patient to check on his status - he was not at home. Attempted to call his cell phone - [email or web address]. Reviewed by Dr. Rayann Heman. He advised that patient needs to be seen sooner than 08/01/14 appointment. Patient agreeable to come in tomorrow, 06/25/14 @ 1100 am with Roderic Palau, NP.  Will keep 08/01/14 appointment with Dr. Tamala Julian open until patient is seen and treatment plan discussed. Patient currently denies discomfort or complaints.

## 2014-06-24 NOTE — Telephone Encounter (Signed)
Terri @ [facility] has left messages for pt to call back to schedule his sleep study

## 2014-06-25 ENCOUNTER — Ambulatory Visit (INDEPENDENT_AMBULATORY_CARE_PROVIDER_SITE_OTHER): Payer: BLUE CROSS/BLUE SHIELD | Admitting: Nurse Practitioner

## 2014-06-25 ENCOUNTER — Encounter: Payer: Self-pay | Admitting: Nurse Practitioner

## 2014-06-25 VITALS — BP 126/80 | HR 71 | Ht 69.0 in | Wt 198.1 lb

## 2014-06-25 DIAGNOSIS — I4891 Unspecified atrial fibrillation: Secondary | ICD-10-CM

## 2014-06-25 NOTE — Patient Instructions (Signed)
Your physician has recommended that you have a sleep study. This test records several body functions during sleep, including: brain activity, eye movement, oxygen and carbon dioxide blood levels, heart rate and rhythm, breathing rate and rhythm, the flow of air through your mouth and nose, snoring, body muscle movements, and chest and belly movement.  Please schedule for split night sleep study @ [facility] your scheduled appointment with Dr. Tamala Julian and continue all medications as prescribed.  Continue wearing monitor
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
## 2014-06-26 ENCOUNTER — Other Ambulatory Visit: Payer: Self-pay

## 2014-06-26 MED ORDER — DILTIAZEM HCL ER COATED BEADS 240 MG PO CP24: 240.0000 mg | ORAL_CAPSULE | Freq: Every day | ORAL | Status: DC

## 2014-07-09 ENCOUNTER — Encounter: Payer: Self-pay | Admitting: Interventional Cardiology

## 2014-07-30 ENCOUNTER — Telehealth: Payer: Self-pay

## 2014-08-01 ENCOUNTER — Ambulatory Visit (INDEPENDENT_AMBULATORY_CARE_PROVIDER_SITE_OTHER): Payer: BLUE CROSS/BLUE SHIELD | Admitting: Interventional Cardiology

## 2014-08-01 ENCOUNTER — Encounter: Payer: Self-pay | Admitting: Interventional Cardiology

## 2014-08-01 VITALS — BP 170/90 | HR 61 | Ht 68.0 in | Wt 200.1 lb

## 2014-08-01 DIAGNOSIS — I10 Essential (primary) hypertension: Secondary | ICD-10-CM

## 2014-08-01 DIAGNOSIS — E785 Hyperlipidemia, unspecified: Secondary | ICD-10-CM

## 2014-08-01 DIAGNOSIS — I480 Paroxysmal atrial fibrillation: Secondary | ICD-10-CM | POA: Diagnosis not present

## 2014-08-01 MED ORDER — DILTIAZEM HCL ER COATED BEADS 360 MG PO CP24: 360.0000 mg | ORAL_CAPSULE | Freq: Every day | ORAL | Status: DC

## 2014-08-01 NOTE — Patient Instructions (Signed)
Your physician has recommended you make the following change in your medication:  1) INCREASE Diltiazem to 360mg  daily. An Rx has been sent to your pharmacy  Your physician wants you to follow-up in: 3-4 months with Dr.Smith You will receive a reminder letter in the mail two months in advance. If you don't receive a letter, please call our office to schedule the follow-up appointment.
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
## 2014-08-30 ENCOUNTER — Encounter (HOSPITAL_BASED_OUTPATIENT_CLINIC_OR_DEPARTMENT_OTHER): Payer: BLUE CROSS/BLUE SHIELD

## 2014-09-09 ENCOUNTER — Ambulatory Visit (HOSPITAL_BASED_OUTPATIENT_CLINIC_OR_DEPARTMENT_OTHER): Payer: BLUE CROSS/BLUE SHIELD | Attending: Cardiology

## 2014-09-09 VITALS — Ht 68.0 in | Wt 190.0 lb

## 2014-09-09 DIAGNOSIS — I480 Paroxysmal atrial fibrillation: Secondary | ICD-10-CM

## 2014-09-09 DIAGNOSIS — R0683 Snoring: Secondary | ICD-10-CM | POA: Insufficient documentation

## 2014-09-09 DIAGNOSIS — I4891 Unspecified atrial fibrillation: Secondary | ICD-10-CM | POA: Diagnosis present

## 2014-09-09 DIAGNOSIS — G4733 Obstructive sleep apnea (adult) (pediatric): Secondary | ICD-10-CM

## 2014-09-09 DIAGNOSIS — I493 Ventricular premature depolarization: Secondary | ICD-10-CM | POA: Insufficient documentation

## 2014-09-12 NOTE — Telephone Encounter (Signed)
error 

## 2014-09-15 ENCOUNTER — Telehealth: Payer: Self-pay | Admitting: Cardiology

## 2014-09-15 DIAGNOSIS — G4733 Obstructive sleep apnea (adult) (pediatric): Secondary | ICD-10-CM

## 2014-09-15 NOTE — Sleep Study (Addendum)
NAME: Philip Jackson DATE OF BIRTH:  08-15-1950 MEDICAL RECORD NUMBER [id number]  LOCATION: [facility]  PHYSICIAN: Thoma Paulsen R  DATE OF STUDY: 09/09/2014  SLEEP STUDY TYPE: Split Night Nocturnal Polysomnogram with CPAP Titration               REFERRING PHYSICIAN: Sueanne Margarita, MD  INDICATION FOR STUDY: atrial fibrillation, snoring  EPWORTH SLEEPINESS SCORE: 1 HEIGHT: 5\' 8"  (172.7 cm)  WEIGHT: 190 lb (86.183 kg)    Body mass index is 28.9 kg/(m^2).  NECK SIZE: 18 in.  MEDICATIONS: Reviewed in the chart  SLEEP ARCHITECTURE: During the diagnostic portion of the study, the patient slept for a total of 213 minutes out of a total sleep time of 258 minutes.  There was no slow wave sleep or REM sleep.  The onset to sleep latency was prolonged at 25 minutes.  The sleep efficiency was reduced at 74%.  During the CPAP titration, the patient slept for a total of 130 minutes out of a total sleep period of 153 minutes.  There was no slow wave sleep and 23 minutes of REM sleep.  The onset to sleep latency was 28 minutes and the onset to REM sleep latency was 52 minutes.  The sleep efficiency was reduced at 71%.     RESPIRATORY DATA: During the diagnostic portion of the study, there were 25 apneas, of which, 19 were obstructive, 2 were central and 4 were mixed apneas.  There were 90 hypopneas.  The AHI was 32.3 events per hour consistent with severe obstructive sleep apnea/hypopnea syndrome.  Most events occurred in the non supine position during NREM sleep.  There was moderate to severe snoring. The patient was started on CPAP at 5cm H2O and this was titrated for respiratory events and snoring to 18cm H2O.  The AHI was 32 events per hour at 18cm H2O.  The patient was unable to achieve REM supine sleep at optimum pressure without further respiratory events.   OXYGEN DATA: During the diagnostic portion of the study, the lowest oxygen saturation was 86% and the average oxygen  saturation was 91%.  During the CPAP titration, the lowest oxygen saturation was 83% and the average saturation was 92%.  The time spent with oxygen saturations < 88% was 2.8 minutes.    CARDIAC DATA: The patient maintained sinus bradycardia with PVC's during the study.  The average heart rate was 59 bpm and the lowest heart rate was 35-38 bpm.  MOVEMENT/PARASOMNIA: During the diagnostic portion of the study, there were an increased number of limb movements with a PLMS index of 18.8 movements per hour.  During the CPAP titration there were no limb movements.  IMPRESSION/ RECOMMENDATION:   1.  Severe Obstructive Sleep Apnea/Hypopnea syndrome with an AHI of 32.3 events per hour.   Most events occurred in the non supine position during NREM sleep 2.  There was moderate to severe snoring noted. 3.  Reduced sleep efficiency with increased frequency of arousals due to respiratory events. 4.  Abnormal sleep architecture with no slow wave or REM sleep.   5.  PVC's were noted during the study. 6.  Oxygen desaturations associated with respiratory events as low as 85%.   7.  The patient was tried but unsuccessful at adequate titration with CPAP do to continued respiratory events.   8.  Recommend overnight sleep study with BiPAP.  Signed: Sueanne Margarita Diplomate, American Board of Sleep Medicine  ELECTRONICALLY SIGNED ON:  09/15/2014, 8:23  PM [facility] PH: [phone]   FX: [phone] ACCREDITED BY THE AMERICAN ACADEMY OF SLEEP MEDICINE

## 2014-09-15 NOTE — Telephone Encounter (Signed)
Please let patient know that they have sleep apnea were not adequately titrated on CPAP and recommend repeat in lab titration with BiPAP.  Please set up titration in the sleep lab.

## 2014-09-17 ENCOUNTER — Telehealth: Payer: Self-pay | Admitting: *Deleted

## 2014-09-17 NOTE — Telephone Encounter (Signed)
Patient is aware of results and is okay to proceed with BiPAP Titration.

## 2014-09-17 NOTE — Telephone Encounter (Signed)
See phone note below for information.

## 2014-09-17 NOTE — Telephone Encounter (Signed)
Patient scheduled for BiPAP Titration 09/20/14

## 2014-09-17 NOTE — Addendum Note (Signed)
Addended by: Andres Ege on: 09/17/2014 01:28 PM   Modules accepted: Orders

## 2014-09-17 NOTE — Telephone Encounter (Signed)
Left message for patient to call regarding sleep study

## 2014-09-17 NOTE — Telephone Encounter (Signed)
Left message for patient to call back  

## 2014-09-17 NOTE — Telephone Encounter (Signed)
New Message  Pt returning Bethany's phone call.

## 2014-09-20 ENCOUNTER — Ambulatory Visit (HOSPITAL_BASED_OUTPATIENT_CLINIC_OR_DEPARTMENT_OTHER): Payer: BLUE CROSS/BLUE SHIELD | Attending: Cardiology

## 2014-09-20 VITALS — Ht 68.0 in | Wt 190.0 lb

## 2014-09-20 DIAGNOSIS — G4733 Obstructive sleep apnea (adult) (pediatric): Secondary | ICD-10-CM | POA: Diagnosis not present

## 2014-09-20 DIAGNOSIS — I4891 Unspecified atrial fibrillation: Secondary | ICD-10-CM | POA: Diagnosis not present

## 2014-10-05 ENCOUNTER — Telehealth: Payer: Self-pay | Admitting: Cardiology

## 2014-10-05 NOTE — Addendum Note (Signed)
Addended by: Sueanne Margarita on: 10/05/2014 03:18 PM   Modules accepted: Orders

## 2014-10-05 NOTE — Telephone Encounter (Signed)
Pt had successful PAP titration. Please setup appointment in 10 weeks. Please let [facility] know that order for PAP is in EPIC.   

## 2014-10-05 NOTE — Sleep Study (Signed)
   NAME: Philip Jackson DATE OF BIRTH:  10-14-1950 MEDICAL RECORD NUMBER [id number]  LOCATION: [facility]  PHYSICIAN: Azarie Coriz R  DATE OF STUDY: 09/20/2014  SLEEP STUDY TYPE: Positive Airway Pressure Titration               REFERRING PHYSICIAN: Sueanne Margarita, MD  INDICATION FOR STUDY: Severe OSA with AHI 32/hr  EPWORTH SLEEPINESS SCORE:  HEIGHT: 5\' 8"  (172.7 cm)  WEIGHT: 190 lb (86.183 kg)    Body mass index is 28.9 kg/(m^2).  NECK SIZE: 18 in.  MEDICATIONS: reviewed in the chart  SLEEP ARCHITECTURE: The Patient slept for a total of 219 minutes out of a total sleep period time of 354 minutes.  There was no slow wave sleep and 31 minutes of REM sleep.  The onset to sleep latency was 7 minutes and the onset to REM sleep latency was prolonged at 184 minutes.  The sleep efficiency was reduced at 60%.   RESPIRATORY DATA: The patient was started on BiPAP at 8/4cm H2O and titrated for respiratory events and snoring to 19/15cm H2O.  The patient was able to sleep for a prolonged period of time and reach REM supine sleep at an optimum pressure of 17/13cm H2O without any further respiratory events.  The AHI at this pressure was 3.5 events per hour.  There was some snoring noted.    OXYGEN DATA: The average oxygen saturation was 96%.  The lowest oxygen saturation was 87%.  The time spent with oxygen saturations < 88% was 0.3 minutes.  The lowest oxygen saturation on BiPAP at 17/13cm H2O was 91%.    CARDIAC DATA: The patient maintained atrial fibrillation throughout the study.  The average heart rate was 101 bpm and the lowest heart rate was 32 bpm.    MOVEMENT/PARASOMNIA: There were an increased number of periodic limb movements during sleep with a PLMS index of 7.1 events per hour.  There were no REM sleep behavior disorders.  IMPRESSION/ RECOMMENDATION:   1.   Successful BiPAP titration to 17/13cm H2O.  The AHI at this pressure was 3.5 events per hour.   2.  The lowest  oxygen saturation was optimum pressure was 91%.   3.  The patient maintained atrial fibrillation with average heart rate 101 bpm.   4.  Recommend ResMed BiPAP with heated humidity and Bi-flex of 3 with medium [employer] Simplus Full Face Mask set at BiPAP of 17/13cm H2O.   5.  Treatment would also include careful attention to proper sleep hygiene, weight reduction if the BMI is elevated, avoidance of sleeping in the supine position and avoidance of alcohol within four hours of bedtime.  Specific treatment decisions should be tailored to each patient based upon the clinical situation and all treatment options should be considered.  The patient should be instructed to avoid driving if sleepy and careful clinical follow up is needed to ensure that the patient's symptoms are improving with therapy and the PAP adherence is supported and measured if prescribed.    Signed: Sueanne Margarita Diplomate, American Board of Sleep Medicine  ELECTRONICALLY SIGNED ON:  10/05/2014, 3:06 PM [place] PH: [phone]   FX: [phone] [place]

## 2014-10-07 NOTE — Telephone Encounter (Signed)
Patient is aware.  [place] Notified.  When patient gets equipment I will schedule 10 week ov
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
## 2014-11-26 ENCOUNTER — Encounter: Payer: Self-pay | Admitting: Interventional Cardiology

## 2014-11-26 ENCOUNTER — Ambulatory Visit (INDEPENDENT_AMBULATORY_CARE_PROVIDER_SITE_OTHER): Payer: BLUE CROSS/BLUE SHIELD | Admitting: Interventional Cardiology

## 2014-11-26 DIAGNOSIS — I10 Essential (primary) hypertension: Secondary | ICD-10-CM

## 2014-11-26 DIAGNOSIS — I4891 Unspecified atrial fibrillation: Secondary | ICD-10-CM

## 2014-11-26 DIAGNOSIS — E785 Hyperlipidemia, unspecified: Secondary | ICD-10-CM

## 2014-11-26 DIAGNOSIS — G4733 Obstructive sleep apnea (adult) (pediatric): Secondary | ICD-10-CM | POA: Diagnosis not present

## 2014-11-26 NOTE — Patient Instructions (Addendum)
Medication Instructions:  Your physician recommends that you continue on your current medications as directed. Please refer to the Current Medication list given to you today.  Continue Diltiazem 360mg  daily  Labwork: None   Testing/Procedures: Your physician has requested that you have an exercise tolerance test. For further information please visit [email or web address]. Please also follow instruction sheet, as given.   Your physician has recommended that you wear a holter monitor. Holter monitors are medical devices that record the heart's electrical activity. Doctors most often use these monitors to diagnose arrhythmias. Arrhythmias are problems with the speed or rhythm of the heartbeat. The monitor is a small, portable device. You can wear one while you do your normal daily activities. This is usually used to diagnose what is causing palpitations/syncope (passing out).   Follow-Up: Your physician wants you to follow-up in: 6 months with Dr.Smith You will receive a reminder letter in the mail two months in advance. If you don't receive a letter, please call our office to schedule the follow-up appointment.   Any Other Special Instructions Will Be Listed Below (If Applicable).

## 2014-12-02 ENCOUNTER — Ambulatory Visit: Payer: BLUE CROSS/BLUE SHIELD

## 2014-12-11 ENCOUNTER — Ambulatory Visit (INDEPENDENT_AMBULATORY_CARE_PROVIDER_SITE_OTHER): Payer: BLUE CROSS/BLUE SHIELD

## 2014-12-11 DIAGNOSIS — I4891 Unspecified atrial fibrillation: Secondary | ICD-10-CM

## 2014-12-19 ENCOUNTER — Telehealth: Payer: Self-pay

## 2014-12-19 NOTE — Telephone Encounter (Signed)
Pt aware of monitor results. Two hours of atrial fib with RVR. Will need anticoagulation when he turns 65. I need to see him around his birthday in [birth year]. Pt has no questions at this time. Pt verbalized understanding.

## 2014-12-19 NOTE — Telephone Encounter (Signed)
-----   Message from Belva Crome, MD sent at 12/18/2014  8:49 PM EDT ----- Two hours of atrial fib with RVR. Will need anticoagulation when he turns 65. I need to see him around his birthday in [birth year].

## 2015-01-07 ENCOUNTER — Ambulatory Visit (INDEPENDENT_AMBULATORY_CARE_PROVIDER_SITE_OTHER): Payer: BLUE CROSS/BLUE SHIELD

## 2015-01-07 ENCOUNTER — Encounter: Payer: BLUE CROSS/BLUE SHIELD | Admitting: Nurse Practitioner

## 2015-01-07 DIAGNOSIS — I4891 Unspecified atrial fibrillation: Secondary | ICD-10-CM

## 2015-01-07 DIAGNOSIS — I10 Essential (primary) hypertension: Secondary | ICD-10-CM | POA: Diagnosis not present

## 2015-01-07 LAB — EXERCISE TOLERANCE TEST
CHL CUP MPHR: 156 {beats}/min
CSEPED: 4 min
CSEPEDS: 22 s
CSEPPHR: 155 {beats}/min
Estimated workload: 6.2 METS
Percent HR: 99 %
RPE: 13
Rest HR: 63 {beats}/min

## 2015-02-01 ENCOUNTER — Ambulatory Visit (INDEPENDENT_AMBULATORY_CARE_PROVIDER_SITE_OTHER): Payer: Self-pay | Admitting: Family Medicine

## 2015-02-01 VITALS — BP 130/90 | HR 70 | Temp 98.1°F | Resp 16 | Ht 68.0 in | Wt 194.0 lb

## 2015-02-01 DIAGNOSIS — Z021 Encounter for pre-employment examination: Secondary | ICD-10-CM

## 2015-02-01 DIAGNOSIS — Z024 Encounter for examination for driving license: Secondary | ICD-10-CM

## 2015-02-01 NOTE — Patient Instructions (Signed)
1 year card given today. Continue follow-up with your cardiologist.   There was a trace  Blood noted on your urine test today. Follow-up with your primary provider to further evaluate this.
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
## 2015-04-20 HISTORY — PX: COLONOSCOPY: SHX174

## 2015-07-03 ENCOUNTER — Encounter: Payer: Self-pay | Admitting: Interventional Cardiology

## 2015-07-03 ENCOUNTER — Ambulatory Visit (INDEPENDENT_AMBULATORY_CARE_PROVIDER_SITE_OTHER): Payer: BLUE CROSS/BLUE SHIELD | Admitting: Interventional Cardiology

## 2015-07-03 VITALS — BP 138/86 | HR 73 | Ht 69.0 in | Wt 200.4 lb

## 2015-07-03 DIAGNOSIS — I4891 Unspecified atrial fibrillation: Secondary | ICD-10-CM

## 2015-07-03 DIAGNOSIS — G4733 Obstructive sleep apnea (adult) (pediatric): Secondary | ICD-10-CM | POA: Diagnosis not present

## 2015-07-03 DIAGNOSIS — I10 Essential (primary) hypertension: Secondary | ICD-10-CM | POA: Diagnosis not present

## 2015-07-03 MED ORDER — ASPIRIN EC 81 MG PO TBEC: 81.0000 mg | DELAYED_RELEASE_TABLET | Freq: Every day | ORAL | Status: DC

## 2015-07-03 NOTE — Patient Instructions (Signed)
Medication Instructions:  START Aspirin 81mg  daily  Labwork: None ordered  Testing/Procedures: None ordered  Follow-Up: Your physician wants you to follow-up in: 1 year You will receive a reminder letter in the mail two months in advance. If you don't receive a letter, please call our office to schedule the follow-up appointment.   Any Other Special Instructions Will Be Listed Below (If Applicable).     If you need a refill on your cardiac medications before your next appointment, please call your pharmacy.
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
## 2015-07-25 DIAGNOSIS — G4733 Obstructive sleep apnea (adult) (pediatric): Secondary | ICD-10-CM | POA: Diagnosis not present

## 2015-07-26 DIAGNOSIS — N418 Other inflammatory diseases of prostate: Secondary | ICD-10-CM | POA: Diagnosis not present

## 2015-09-22 ENCOUNTER — Encounter: Payer: Self-pay | Admitting: Gastroenterology

## 2015-11-11 ENCOUNTER — Ambulatory Visit (AMBULATORY_SURGERY_CENTER): Payer: Self-pay

## 2015-11-11 ENCOUNTER — Encounter: Payer: Self-pay | Admitting: Gastroenterology

## 2015-11-11 VITALS — Ht 69.0 in | Wt 201.0 lb

## 2015-11-11 DIAGNOSIS — Z1211 Encounter for screening for malignant neoplasm of colon: Secondary | ICD-10-CM

## 2015-11-11 MED ORDER — SUPREP BOWEL PREP KIT 17.5-3.13-1.6 GM/177ML PO SOLN: 1.0000 | Freq: Once | ORAL | 0 refills | Status: AC

## 2015-11-11 NOTE — Progress Notes (Signed)
No allergies to eggs or soy No past problems with anesthesia No home oxygen No diet meds

## 2015-11-17 DIAGNOSIS — Z8042 Family history of malignant neoplasm of prostate: Secondary | ICD-10-CM | POA: Diagnosis not present

## 2015-11-17 DIAGNOSIS — R972 Elevated prostate specific antigen [PSA]: Secondary | ICD-10-CM | POA: Diagnosis not present

## 2015-11-17 DIAGNOSIS — N400 Enlarged prostate without lower urinary tract symptoms: Secondary | ICD-10-CM | POA: Diagnosis not present

## 2015-11-25 ENCOUNTER — Encounter: Payer: Self-pay | Admitting: Gastroenterology

## 2015-11-25 ENCOUNTER — Ambulatory Visit (AMBULATORY_SURGERY_CENTER): Payer: BLUE CROSS/BLUE SHIELD | Admitting: Gastroenterology

## 2015-11-25 VITALS — BP 117/71 | HR 52 | Temp 98.2°F | Resp 17 | Ht 69.0 in | Wt 201.0 lb

## 2015-11-25 DIAGNOSIS — D124 Benign neoplasm of descending colon: Secondary | ICD-10-CM | POA: Diagnosis not present

## 2015-11-25 DIAGNOSIS — K635 Polyp of colon: Secondary | ICD-10-CM | POA: Diagnosis not present

## 2015-11-25 DIAGNOSIS — D126 Benign neoplasm of colon, unspecified: Secondary | ICD-10-CM

## 2015-11-25 DIAGNOSIS — D123 Benign neoplasm of transverse colon: Secondary | ICD-10-CM | POA: Diagnosis not present

## 2015-11-25 DIAGNOSIS — Z1211 Encounter for screening for malignant neoplasm of colon: Secondary | ICD-10-CM

## 2015-11-25 MED ORDER — SODIUM CHLORIDE 0.9 % IV SOLN: 500.0000 mL | INTRAVENOUS | Status: DC

## 2015-11-25 NOTE — Op Note (Signed)
[place] Patient Name: Philip Jackson Procedure Date: 11/25/2015 9:41 AM MRN: [id number] Endoscopist: Philip Jackson , MD Age: 65 Referring MD:  Date of Birth: 1950/09/02 Gender: Male Account #: [id number] Procedure:                Colonoscopy Indications:              Screening for malignant neoplasm in the colon Medicines:                Monitored Anesthesia Care Procedure:                Pre-Anesthesia Assessment:                           - Prior to the procedure, a History and Physical                            was performed, and patient medications and                            allergies were reviewed. The patient's tolerance of                            previous anesthesia was also reviewed. The risks                            and benefits of the procedure and the sedation                            options and risks were discussed with the patient.                            All questions were answered, and informed consent                            was obtained. Prior Anticoagulants: The patient has                            taken no previous anticoagulant or antiplatelet                            agents. ASA Grade Assessment: III - A patient with                            severe systemic disease. After reviewing the risks                            and benefits, the patient was deemed in                            satisfactory condition to undergo the procedure.                           After obtaining informed consent, the colonoscope  was passed under direct vision. Throughout the                            procedure, the patient's blood pressure, pulse, and                            oxygen saturations were monitored continuously. The                            Model CF-HQ190L [id number]) scope was introduced                            through the anus and advanced to the the cecum,                            identified by  appendiceal orifice and ileocecal                            valve. The colonoscopy was performed without                            difficulty. The patient tolerated the procedure                            well. The quality of the bowel preparation was                            good. The ileocecal valve, appendiceal orifice, and                            rectum were photographed. Scope In: 9:44:42 AM Scope Out: 10:04:59 AM Scope Withdrawal Time: 0 hours 13 minutes 40 seconds  Total Procedure Duration: 0 hours 20 minutes 17 seconds  Findings:                 The perianal and digital rectal examinations were                            normal.                           A 6 mm polyp was found in the hepatic flexure. The                            polyp was sessile. The polyp was removed with a                            cold snare. Resection and retrieval were complete.                           Six sessile polyps were found in the transverse                            colon. The polyps were 4 to 8 mm in size.  These                            polyps were removed with a cold snare. The largest                            (photograph not taken) needed one burst of "pulse                            cut slow" to cut through it. Resection and                            retrieval were complete.                           A 15 mm polyp was found in the descending colon.                            The polyp was semi-pedunculated. The polyp was                            removed with a hot snare. Resection and retrieval                            were complete.                           Scattered medium-mouthed diverticula were found in                            the entire colon.                           Non-bleeding internal hemorrhoids were found during                            retroflexion.                           The exam was otherwise without abnormality. Complications:            No  immediate complications. Estimated blood loss:                            Minimal. Estimated Blood Loss:     Estimated blood loss was minimal. Impression:               - One 6 mm polyp at the hepatic flexure, removed                            with a cold snare. Resected and retrieved.                           - Six 4 to 8 mm polyps in the transverse colon,  removed with a cold snare. Resected and retrieved.                           - One 15 mm polyp in the descending colon, removed                            with a hot snare. Resected and retrieved.                           - Diverticulosis in the entire examined colon.                           - Non-bleeding internal hemorrhoids.                           - The examination was otherwise normal. Recommendation:           - Patient has a contact number available for                            emergencies. The signs and symptoms of potential                            delayed complications were discussed with the                            patient. Return to normal activities tomorrow.                            Written discharge instructions were provided to the                            patient.                           - Resume previous diet.                           - Continue present medications including baby                            aspirin                           - No ibuprofen, naproxen, or other non-steroidal                            anti-inflammatory drugs for 2 weeks after polyp                            removal.                           - Await pathology results.                           - Repeat colonoscopy is recommended  for                            surveillance. The colonoscopy date will be                            determined after pathology results from today's                            exam become available for review. Philip Lipps P. Tyshun Tuckerman, MD 11/25/2015 10:10:28 AM This  report has been signed electronically.

## 2015-11-25 NOTE — Progress Notes (Signed)
To recovery, reprt to [employer], RN, VSS

## 2015-11-25 NOTE — Patient Instructions (Signed)
Discharge instructions given. Handouts on polyps,diverticulosis and hemorrhoids. Resume previous medications. YOU HAD AN ENDOSCOPIC PROCEDURE TODAY AT THE [facility]:   Refer to the procedure report that was given to you for any specific questions about what was found during the examination.  If the procedure report does not answer your questions, please call your gastroenterologist to clarify.  If you requested that your care partner not be given the details of your procedure findings, then the procedure report has been included in a sealed envelope for you to review at your convenience later.  YOU SHOULD EXPECT: Some feelings of bloating in the abdomen. Passage of more gas than usual.  Walking can help get rid of the air that was put into your GI tract during the procedure and reduce the bloating. If you had a lower endoscopy (such as a colonoscopy or flexible sigmoidoscopy) you may notice spotting of blood in your stool or on the toilet paper. If you underwent a bowel prep for your procedure, you may not have a normal bowel movement for a few days.  Please Note:  You might notice some irritation and congestion in your nose or some drainage.  This is from the oxygen used during your procedure.  There is no need for concern and it should clear up in a day or so.  SYMPTOMS TO REPORT IMMEDIATELY:   Following lower endoscopy (colonoscopy or flexible sigmoidoscopy):  Excessive amounts of blood in the stool  Significant tenderness or worsening of abdominal pains  Swelling of the abdomen that is new, acute  Fever of 100F or higher   For urgent or emergent issues, a gastroenterologist can be reached at any hour by calling [phone].   DIET: Your first meal following the procedure should be a small meal and then it is ok to progress to your normal diet. Heavy or fried foods are harder to digest and may make you feel nauseous or bloated.  Likewise, meals heavy in dairy and  vegetables can increase bloating.  Drink plenty of fluids but you should avoid alcoholic beverages for 24 hours.  ACTIVITY:  You should plan to take it easy for the rest of today and you should NOT DRIVE or use heavy machinery until tomorrow (because of the sedation medicines used during the test).    FOLLOW UP: Our staff will call the number listed on your records the next business day following your procedure to check on you and address any questions or concerns that you may have regarding the information given to you following your procedure. If we do not reach you, we will leave a message.  However, if you are feeling well and you are not experiencing any problems, there is no need to return our call.  We will assume that you have returned to your regular daily activities without incident.  If any biopsies were taken you will be contacted by phone or by letter within the next 1-3 weeks.  Please call us at [phone] if you have not heard about the biopsies in 3 weeks.    SIGNATURES/CONFIDENTIALITY: You and/or your care partner have signed paperwork which will be entered into your electronic medical record.  These signatures attest to the fact that that the information above on your After Visit Summary has been reviewed and is understood.  Full responsibility of the confidentiality of this discharge information lies with you and/or your care-partner. 

## 2015-11-26 ENCOUNTER — Telehealth: Payer: Self-pay | Admitting: *Deleted

## 2015-11-26 NOTE — Telephone Encounter (Signed)
  Follow up Call-  Call back number 11/25/2015  Post procedure Call Back phone  # [phone]  Permission to leave phone message Yes  Some recent data might be hidden     Patient questions:  Do you have a fever, pain , or abdominal swelling? No. Pain Score  0 *  Have you tolerated food without any problems? Yes.    Have you been able to return to your normal activities? Yes.    Do you have any questions about your discharge instructions: Diet   No. Medications  No. Follow up visit  No.  Do you have questions or concerns about your Care? No.  Actions: * If pain score is 4 or above: No action needed, pain <4.

## 2015-11-27 ENCOUNTER — Encounter: Payer: Self-pay | Admitting: Gastroenterology

## 2015-12-10 DIAGNOSIS — D485 Neoplasm of uncertain behavior of skin: Secondary | ICD-10-CM | POA: Diagnosis not present

## 2015-12-10 DIAGNOSIS — L570 Actinic keratosis: Secondary | ICD-10-CM | POA: Diagnosis not present

## 2016-03-04 ENCOUNTER — Telehealth: Payer: Self-pay | Admitting: Interventional Cardiology

## 2016-03-04 NOTE — Telephone Encounter (Signed)
Yes it is okay to use anti-inflammatory therapy for brief periods.

## 2016-03-04 NOTE — Telephone Encounter (Signed)
Will forward to Dr. Smith for review. 

## 2016-03-04 NOTE — Telephone Encounter (Signed)
Dr. Lisbeth Ply calling to see if ok for patient to take a short course of anti-inflammatory -having some pain issues

## 2016-03-04 NOTE — Telephone Encounter (Signed)
Spoke with [facility] and provided her with information per Dr  Tamala Julian.  Philip Jackson verbalized understanding and said she will get information to Dr. Lisbeth Ply.

## 2016-03-24 DIAGNOSIS — M1711 Unilateral primary osteoarthritis, right knee: Secondary | ICD-10-CM | POA: Diagnosis not present

## 2016-05-11 DIAGNOSIS — H35031 Hypertensive retinopathy, right eye: Secondary | ICD-10-CM | POA: Diagnosis not present

## 2016-05-11 DIAGNOSIS — H35032 Hypertensive retinopathy, left eye: Secondary | ICD-10-CM | POA: Diagnosis not present

## 2016-05-11 DIAGNOSIS — H43391 Other vitreous opacities, right eye: Secondary | ICD-10-CM | POA: Diagnosis not present

## 2016-05-11 DIAGNOSIS — H35362 Drusen (degenerative) of macula, left eye: Secondary | ICD-10-CM | POA: Diagnosis not present

## 2016-05-11 DIAGNOSIS — H2513 Age-related nuclear cataract, bilateral: Secondary | ICD-10-CM | POA: Diagnosis not present

## 2016-05-11 DIAGNOSIS — H25013 Cortical age-related cataract, bilateral: Secondary | ICD-10-CM | POA: Diagnosis not present

## 2016-05-11 DIAGNOSIS — H35033 Hypertensive retinopathy, bilateral: Secondary | ICD-10-CM | POA: Diagnosis not present

## 2016-05-17 DIAGNOSIS — R972 Elevated prostate specific antigen [PSA]: Secondary | ICD-10-CM | POA: Diagnosis not present

## 2016-05-17 DIAGNOSIS — Z8042 Family history of malignant neoplasm of prostate: Secondary | ICD-10-CM | POA: Diagnosis not present

## 2016-05-17 DIAGNOSIS — N138 Other obstructive and reflux uropathy: Secondary | ICD-10-CM | POA: Diagnosis not present

## 2016-05-17 DIAGNOSIS — N401 Enlarged prostate with lower urinary tract symptoms: Secondary | ICD-10-CM | POA: Diagnosis not present

## 2016-06-09 DIAGNOSIS — G4733 Obstructive sleep apnea (adult) (pediatric): Secondary | ICD-10-CM | POA: Diagnosis not present

## 2016-06-17 DIAGNOSIS — H43391 Other vitreous opacities, right eye: Secondary | ICD-10-CM | POA: Diagnosis not present

## 2016-06-17 DIAGNOSIS — H43811 Vitreous degeneration, right eye: Secondary | ICD-10-CM | POA: Diagnosis not present

## 2016-06-17 DIAGNOSIS — H25013 Cortical age-related cataract, bilateral: Secondary | ICD-10-CM | POA: Diagnosis not present

## 2016-06-17 DIAGNOSIS — H2513 Age-related nuclear cataract, bilateral: Secondary | ICD-10-CM | POA: Diagnosis not present

## 2016-07-01 DIAGNOSIS — M1731 Unilateral post-traumatic osteoarthritis, right knee: Secondary | ICD-10-CM | POA: Diagnosis not present

## 2016-07-02 ENCOUNTER — Encounter (INDEPENDENT_AMBULATORY_CARE_PROVIDER_SITE_OTHER): Payer: Self-pay

## 2016-07-02 ENCOUNTER — Ambulatory Visit (INDEPENDENT_AMBULATORY_CARE_PROVIDER_SITE_OTHER): Payer: BLUE CROSS/BLUE SHIELD | Admitting: Interventional Cardiology

## 2016-07-02 ENCOUNTER — Encounter: Payer: Self-pay | Admitting: Interventional Cardiology

## 2016-07-02 VITALS — BP 126/84 | HR 68 | Ht 67.5 in | Wt 205.8 lb

## 2016-07-02 DIAGNOSIS — G4733 Obstructive sleep apnea (adult) (pediatric): Secondary | ICD-10-CM

## 2016-07-02 DIAGNOSIS — Z01810 Encounter for preprocedural cardiovascular examination: Secondary | ICD-10-CM

## 2016-07-02 DIAGNOSIS — E784 Other hyperlipidemia: Secondary | ICD-10-CM | POA: Diagnosis not present

## 2016-07-02 DIAGNOSIS — I4891 Unspecified atrial fibrillation: Secondary | ICD-10-CM

## 2016-07-02 DIAGNOSIS — I10 Essential (primary) hypertension: Secondary | ICD-10-CM

## 2016-07-02 DIAGNOSIS — E7849 Other hyperlipidemia: Secondary | ICD-10-CM

## 2016-07-02 DIAGNOSIS — L400 Psoriasis vulgaris: Secondary | ICD-10-CM | POA: Diagnosis not present
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
## 2016-07-02 NOTE — Patient Instructions (Signed)

## 2016-07-20 ENCOUNTER — Ambulatory Visit: Payer: Self-pay | Admitting: Orthopedic Surgery
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
## 2016-07-21 ENCOUNTER — Other Ambulatory Visit: Payer: Self-pay | Admitting: Orthopedic Surgery

## 2016-08-02 NOTE — Pre-Procedure Instructions (Signed)
    R PEARSE SHIFFLER  08/02/2016      [id number], [place] [address] Phone: [phone] Fax: [phone]  [facility] [place], [place] [address] Phone: [phone] Fax: [phone]  [facility] [address] [place], [place] [address] [place] [address] Phone: [phone] Fax: [phone]    Your procedure is scheduled on : Monday, April 23rd   Report to [facility]             (posted surgery time 2:00 pm - 3:00 pm)   Call this number if you have problems the morning of surgery:   [phone]   Remember:  Do not eat food or drink liquids after midnight Sunday.              4-5 days prior to surgery, STOP taking any herbal supplements, vitamins, anti-inflammatories.   Take these medicines the morning of surgery with A SIP OF WATER :Cardiazem, Hydrocodone.   Do not wear jewelry - no rings or watches.  Do not wear lotions, colognes or deoderant.             Men may shave face and neck.   Do not bring valuables to the hospital.  [facility] is not responsible for any belongings or valuables.  Contacts, dentures or bridgework may not be worn into surgery.  Leave your suitcase in the car.  After surgery it may be brought to your room.  For patients admitted to the hospital, discharge time will be determined by your treatment team.  Patients discharged the day of surgery will not be allowed to drive home, AND will need someone to stay with you for the first 24 hrs.  Please read over the following fact sheets that you were given. Pain Booklet and Surgical Site Infection Prevention

## 2016-08-03 ENCOUNTER — Encounter (HOSPITAL_COMMUNITY): Payer: Self-pay

## 2016-08-03 ENCOUNTER — Encounter (HOSPITAL_COMMUNITY)
Admission: RE | Admit: 2016-08-03 | Discharge: 2016-08-03 | Disposition: A | Discharge status: Home / Self Care | Payer: BLUE CROSS/BLUE SHIELD | Source: Ambulatory Visit | Attending: Orthopedic Surgery | Admitting: Orthopedic Surgery

## 2016-08-03 ENCOUNTER — Telehealth: Payer: Self-pay | Admitting: Interventional Cardiology

## 2016-08-03 DIAGNOSIS — Z01812 Encounter for preprocedural laboratory examination: Secondary | ICD-10-CM | POA: Insufficient documentation

## 2016-08-03 DIAGNOSIS — M25561 Pain in right knee: Secondary | ICD-10-CM | POA: Insufficient documentation

## 2016-08-03 HISTORY — DX: Unspecified atrial fibrillation: I48.91

## 2016-08-03 HISTORY — DX: Unspecified osteoarthritis, unspecified site: M19.90

## 2016-08-03 HISTORY — DX: Malignant (primary) neoplasm, unspecified: C80.1

## 2016-08-03 LAB — BASIC METABOLIC PANEL
Anion gap: 10 (ref 5–15)
BUN: 9 mg/dL (ref 6–20)
CALCIUM: 9.1 mg/dL (ref 8.9–10.3)
CO2: 24 mmol/L (ref 22–32)
CREATININE: 0.87 mg/dL (ref 0.61–1.24)
Chloride: 106 mmol/L (ref 101–111)
Glucose, Bld: 101 mg/dL — ABNORMAL HIGH (ref 65–99)
Potassium: 4.2 mmol/L (ref 3.5–5.1)
SODIUM: 140 mmol/L (ref 135–145)

## 2016-08-03 LAB — CBC
HCT: 44.2 % (ref 39.0–52.0)
Hemoglobin: 14.5 g/dL (ref 13.0–17.0)
MCH: 30 pg (ref 26.0–34.0)
MCHC: 32.8 g/dL (ref 30.0–36.0)
MCV: 91.5 fL (ref 78.0–100.0)
PLATELETS: 299 10*3/uL (ref 150–400)
RBC: 4.83 MIL/uL (ref 4.22–5.81)
RDW: 13 % (ref 11.5–15.5)
WBC: 10.4 10*3/uL (ref 4.0–10.5)

## 2016-08-03 NOTE — Telephone Encounter (Signed)
Spoke with Janett Billow at [facility] and made her aware ok recommendations per Dr. Tamala Julian.

## 2016-08-03 NOTE — Telephone Encounter (Signed)
New Message    Jessica @ [facility] called needing instructions for patient's Asprin. Received surgical clearance, but nothing regarding what to do about the asprin. Requesting call back.

## 2016-08-03 NOTE — Progress Notes (Signed)
Patient made aware to stop aspirin 7 days prior to srugery

## 2016-08-03 NOTE — Progress Notes (Signed)
PCP - Daiva Eves - Work Primary Dr Cardiologist - Dr. Tamala Julian - contacted for aspirin instructions - clearance in shadow chart  Chest x-ray - 05/2014 EKG - 06/2016 Stress Test - 12/2014 ECHO - 06/2014 Cardiac Cath - denies  Sleep Study - years ago could not remember when CPAP - wears at night settings at a 4  Will send to anesthesia for review. Needs aspirin instructions dr. Tamala Julian office has been called    Patient denies shortness of breath, fever, cough and chest pain at PAT appointment   Patient verbalized understanding of instructions that was given to them at the PAT appointment. Patient expressed that there were no further questions.  Patient was also instructed that they will need to review over the PAT instructions again at home before the surgery.

## 2016-08-03 NOTE — Progress Notes (Signed)
Called Dr. Thompson Caul office for Aspirin instructions for Patient before surgery.  Message will be given to [facility] Dr. Darliss Ridgel Nurse.  Will wait for a response

## 2016-08-03 NOTE — Telephone Encounter (Signed)
Will route to Dr. Tamala Julian for review and advisement on holding ASA prior to rt knee hardware removal.

## 2016-08-03 NOTE — Telephone Encounter (Signed)
It is okay to discontinue/hold aspirin for up to 5 days prior to surgery. Resume when safe.

## 2016-08-09 ENCOUNTER — Encounter (HOSPITAL_COMMUNITY)
Admission: RE | Disposition: A | Discharge status: Home / Self Care | Payer: Self-pay | Source: Ambulatory Visit | Attending: Orthopedic Surgery

## 2016-08-09 ENCOUNTER — Inpatient Hospital Stay (HOSPITAL_COMMUNITY): Payer: BLUE CROSS/BLUE SHIELD | Admitting: Anesthesiology

## 2016-08-09 ENCOUNTER — Inpatient Hospital Stay (HOSPITAL_COMMUNITY): Payer: BLUE CROSS/BLUE SHIELD

## 2016-08-09 ENCOUNTER — Observation Stay (HOSPITAL_COMMUNITY): Payer: BLUE CROSS/BLUE SHIELD

## 2016-08-09 ENCOUNTER — Observation Stay (HOSPITAL_COMMUNITY)
Admission: RE | Admit: 2016-08-09 | Discharge: 2016-08-10 | Disposition: A | Discharge status: Home / Self Care | Payer: BLUE CROSS/BLUE SHIELD | Source: Ambulatory Visit | Attending: Orthopedic Surgery | Admitting: Orthopedic Surgery

## 2016-08-09 ENCOUNTER — Encounter (HOSPITAL_COMMUNITY): Payer: Self-pay | Admitting: *Deleted

## 2016-08-09 DIAGNOSIS — D649 Anemia, unspecified: Secondary | ICD-10-CM | POA: Diagnosis not present

## 2016-08-09 DIAGNOSIS — I10 Essential (primary) hypertension: Secondary | ICD-10-CM | POA: Insufficient documentation

## 2016-08-09 DIAGNOSIS — I4891 Unspecified atrial fibrillation: Secondary | ICD-10-CM | POA: Insufficient documentation

## 2016-08-09 DIAGNOSIS — Z09 Encounter for follow-up examination after completed treatment for conditions other than malignant neoplasm: Secondary | ICD-10-CM

## 2016-08-09 DIAGNOSIS — G4730 Sleep apnea, unspecified: Secondary | ICD-10-CM | POA: Insufficient documentation

## 2016-08-09 DIAGNOSIS — G8918 Other acute postprocedural pain: Secondary | ICD-10-CM | POA: Diagnosis not present

## 2016-08-09 DIAGNOSIS — K219 Gastro-esophageal reflux disease without esophagitis: Secondary | ICD-10-CM | POA: Insufficient documentation

## 2016-08-09 DIAGNOSIS — Z85828 Personal history of other malignant neoplasm of skin: Secondary | ICD-10-CM | POA: Insufficient documentation

## 2016-08-09 DIAGNOSIS — Y831 Surgical operation with implant of artificial internal device as the cause of abnormal reaction of the patient, or of later complication, without mention of misadventure at the time of the procedure: Secondary | ICD-10-CM | POA: Diagnosis not present

## 2016-08-09 DIAGNOSIS — T8484XA Pain due to internal orthopedic prosthetic devices, implants and grafts, initial encounter: Secondary | ICD-10-CM | POA: Diagnosis not present

## 2016-08-09 DIAGNOSIS — Z87891 Personal history of nicotine dependence: Secondary | ICD-10-CM | POA: Diagnosis not present

## 2016-08-09 DIAGNOSIS — T8489XA Other specified complication of internal orthopedic prosthetic devices, implants and grafts, initial encounter: Secondary | ICD-10-CM | POA: Diagnosis not present

## 2016-08-09 DIAGNOSIS — Z472 Encounter for removal of internal fixation device: Secondary | ICD-10-CM | POA: Diagnosis not present

## 2016-08-09 DIAGNOSIS — R937 Abnormal findings on diagnostic imaging of other parts of musculoskeletal system: Secondary | ICD-10-CM | POA: Diagnosis not present

## 2016-08-09 DIAGNOSIS — Z969 Presence of functional implant, unspecified: Secondary | ICD-10-CM

## 2016-08-09 DIAGNOSIS — Z419 Encounter for procedure for purposes other than remedying health state, unspecified: Secondary | ICD-10-CM

## 2016-08-09 HISTORY — PX: HARDWARE REMOVAL: SHX979

## 2016-08-09 LAB — CBC
HCT: 41.1 % (ref 39.0–52.0)
Hemoglobin: 13.4 g/dL (ref 13.0–17.0)
MCH: 29.8 pg (ref 26.0–34.0)
MCHC: 32.6 g/dL (ref 30.0–36.0)
MCV: 91.5 fL (ref 78.0–100.0)
PLATELETS: 300 10*3/uL (ref 150–400)
RBC: 4.49 MIL/uL (ref 4.22–5.81)
RDW: 12.7 % (ref 11.5–15.5)
WBC: 13.2 10*3/uL — AB (ref 4.0–10.5)

## 2016-08-09 LAB — CREATININE, SERUM: CREATININE: 0.89 mg/dL (ref 0.61–1.24)

## 2016-08-09 SURGERY — REMOVAL, HARDWARE
Anesthesia: General | Site: Knee | Laterality: Right

## 2016-08-09 MED ORDER — PROPOFOL 10 MG/ML IV BOLUS
INTRAVENOUS | Status: DC | PRN
  Administered 2016-08-09: 180 mg via INTRAVENOUS

## 2016-08-09 MED ORDER — ONDANSETRON HCL 4 MG/2ML IJ SOLN: 4.0000 mg | Freq: Four times a day (QID) | INTRAMUSCULAR | Status: DC | PRN

## 2016-08-09 MED ORDER — FESOTERODINE FUMARATE ER 4 MG PO TB24
4.0000 mg | ORAL_TABLET | Freq: Every day | ORAL | Status: DC
Start: 2016-08-09 — End: 2016-08-10
  Administered 2016-08-09: 4 mg via ORAL
  Filled 2016-08-09 (×3): qty 1

## 2016-08-09 MED ORDER — CHLORHEXIDINE GLUCONATE 4 % EX LIQD: 60.0000 mL | Freq: Once | CUTANEOUS | Status: DC

## 2016-08-09 MED ORDER — FENTANYL CITRATE (PF) 100 MCG/2ML IJ SOLN
INTRAMUSCULAR | Status: DC | PRN
  Administered 2016-08-09: 50 ug via INTRAVENOUS
  Administered 2016-08-09: 150 ug via INTRAVENOUS
  Administered 2016-08-09: 50 ug via INTRAVENOUS

## 2016-08-09 MED ORDER — FERROUS SULFATE 325 (65 FE) MG PO TABS
325.0000 mg | ORAL_TABLET | ORAL | Status: DC
  Administered 2016-08-09: 325 mg via ORAL
  Filled 2016-08-09: qty 1

## 2016-08-09 MED ORDER — HYDROMORPHONE HCL 1 MG/ML IJ SOLN: 0.2500 mg | INTRAMUSCULAR | Status: DC | PRN

## 2016-08-09 MED ORDER — MIDAZOLAM HCL 2 MG/2ML IJ SOLN
INTRAMUSCULAR | Status: AC
  Filled 2016-08-09: qty 2

## 2016-08-09 MED ORDER — DILTIAZEM HCL ER COATED BEADS 360 MG PO CP24
360.0000 mg | ORAL_CAPSULE | Freq: Every day | ORAL | Status: DC
Start: 2016-08-10 — End: 2016-08-10
  Administered 2016-08-10: 360 mg via ORAL
  Filled 2016-08-09: qty 2
  Filled 2016-08-09: qty 1

## 2016-08-09 MED ORDER — BUPIVACAINE-EPINEPHRINE (PF) 0.5% -1:200000 IJ SOLN
INTRAMUSCULAR | Status: DC | PRN
  Administered 2016-08-09: 25 mL via PERINEURAL

## 2016-08-09 MED ORDER — LIDOCAINE 2% (20 MG/ML) 5 ML SYRINGE
INTRAMUSCULAR | Status: AC
  Filled 2016-08-09: qty 5

## 2016-08-09 MED ORDER — BUPIVACAINE-EPINEPHRINE 0.5% -1:200000 IJ SOLN
INTRAMUSCULAR | Status: DC | PRN
  Administered 2016-08-09: 20 mL

## 2016-08-09 MED ORDER — LACTATED RINGERS IV SOLN
INTRAVENOUS | Status: DC | PRN
  Administered 2016-08-09 (×2): via INTRAVENOUS

## 2016-08-09 MED ORDER — SODIUM CHLORIDE 0.9 % IR SOLN
Status: DC | PRN
  Administered 2016-08-09: 3000 mL

## 2016-08-09 MED ORDER — EPHEDRINE 5 MG/ML INJ
INTRAVENOUS | Status: AC
  Filled 2016-08-09: qty 10

## 2016-08-09 MED ORDER — ROSUVASTATIN CALCIUM 20 MG PO TABS
20.0000 mg | ORAL_TABLET | Freq: Every day | ORAL | Status: DC
  Administered 2016-08-09: 20 mg via ORAL
  Filled 2016-08-09: qty 1

## 2016-08-09 MED ORDER — ONDANSETRON HCL 4 MG/2ML IJ SOLN
INTRAMUSCULAR | Status: AC
  Filled 2016-08-09: qty 2

## 2016-08-09 MED ORDER — KETOROLAC TROMETHAMINE 15 MG/ML IJ SOLN
7.5000 mg | Freq: Four times a day (QID) | INTRAMUSCULAR | Status: AC
  Administered 2016-08-09 – 2016-08-10 (×4): 7.5 mg via INTRAVENOUS
  Filled 2016-08-09 (×4): qty 1

## 2016-08-09 MED ORDER — ACETAMINOPHEN 10 MG/ML IV SOLN
INTRAVENOUS | Status: AC
  Filled 2016-08-09: qty 100

## 2016-08-09 MED ORDER — FENTANYL CITRATE (PF) 100 MCG/2ML IJ SOLN
INTRAMUSCULAR | Status: AC
  Administered 2016-08-09: 50 ug via INTRAVENOUS
  Filled 2016-08-09: qty 2

## 2016-08-09 MED ORDER — MIDAZOLAM HCL 2 MG/2ML IJ SOLN
1.0000 mg | Freq: Once | INTRAMUSCULAR | Status: AC
  Administered 2016-08-09: 1 mg via INTRAVENOUS

## 2016-08-09 MED ORDER — FENTANYL CITRATE (PF) 100 MCG/2ML IJ SOLN
50.0000 ug | Freq: Once | INTRAMUSCULAR | Status: AC
  Administered 2016-08-09: 50 ug via INTRAVENOUS

## 2016-08-09 MED ORDER — METOCLOPRAMIDE HCL 5 MG PO TABS: 5.0000 mg | ORAL_TABLET | Freq: Three times a day (TID) | ORAL | Status: DC | PRN

## 2016-08-09 MED ORDER — SENNA 8.6 MG PO TABS
1.0000 | ORAL_TABLET | Freq: Two times a day (BID) | ORAL | Status: DC
  Administered 2016-08-09 – 2016-08-10 (×2): 8.6 mg via ORAL
  Filled 2016-08-09 (×2): qty 1

## 2016-08-09 MED ORDER — DOCUSATE SODIUM 100 MG PO CAPS
100.0000 mg | ORAL_CAPSULE | Freq: Two times a day (BID) | ORAL | Status: DC
  Administered 2016-08-09 – 2016-08-10 (×2): 100 mg via ORAL
  Filled 2016-08-09 (×2): qty 1

## 2016-08-09 MED ORDER — IRBESARTAN 150 MG PO TABS
150.0000 mg | ORAL_TABLET | Freq: Every day | ORAL | Status: DC
  Administered 2016-08-09: 150 mg via ORAL
  Filled 2016-08-09 (×2): qty 1

## 2016-08-09 MED ORDER — PANTOPRAZOLE SODIUM 40 MG PO TBEC
80.0000 mg | DELAYED_RELEASE_TABLET | Freq: Every day | ORAL | Status: DC
  Administered 2016-08-09 – 2016-08-10 (×2): 80 mg via ORAL
  Filled 2016-08-09 (×2): qty 2

## 2016-08-09 MED ORDER — METHOCARBAMOL 1000 MG/10ML IJ SOLN
500.0000 mg | Freq: Four times a day (QID) | INTRAVENOUS | Status: DC | PRN
  Filled 2016-08-09: qty 5

## 2016-08-09 MED ORDER — HYDROMORPHONE HCL 1 MG/ML IJ SOLN
0.5000 mg | INTRAMUSCULAR | Status: DC | PRN
  Administered 2016-08-09: 0.5 mg via INTRAVENOUS
  Filled 2016-08-09: qty 1

## 2016-08-09 MED ORDER — ONDANSETRON HCL 4 MG/2ML IJ SOLN: 4.0000 mg | Freq: Once | INTRAMUSCULAR | Status: DC | PRN

## 2016-08-09 MED ORDER — ONDANSETRON HCL 4 MG PO TABS: 4.0000 mg | ORAL_TABLET | Freq: Four times a day (QID) | ORAL | Status: DC | PRN

## 2016-08-09 MED ORDER — ACETAMINOPHEN 650 MG RE SUPP: 650.0000 mg | Freq: Four times a day (QID) | RECTAL | Status: DC | PRN

## 2016-08-09 MED ORDER — 0.9 % SODIUM CHLORIDE (POUR BTL) OPTIME
TOPICAL | Status: DC | PRN
  Administered 2016-08-09: 1000 mL

## 2016-08-09 MED ORDER — MEPERIDINE HCL 25 MG/ML IJ SOLN: 6.2500 mg | INTRAMUSCULAR | Status: DC | PRN

## 2016-08-09 MED ORDER — SODIUM CHLORIDE 0.9 % IV SOLN
INTRAVENOUS | Status: DC
  Administered 2016-08-09 – 2016-08-10 (×2): via INTRAVENOUS

## 2016-08-09 MED ORDER — BUPIVACAINE HCL (PF) 0.5 % IJ SOLN
INTRAMUSCULAR | Status: AC
  Filled 2016-08-09: qty 30

## 2016-08-09 MED ORDER — POLYETHYLENE GLYCOL 3350 17 G PO PACK: 17.0000 g | PACK | Freq: Every day | ORAL | Status: DC | PRN

## 2016-08-09 MED ORDER — DEXTROSE 5 % IV SOLN
INTRAVENOUS | Status: DC | PRN
Start: 2016-08-09 — End: 2016-08-09
  Administered 2016-08-09: 14:00:00 via INTRAVENOUS

## 2016-08-09 MED ORDER — ACETAMINOPHEN 10 MG/ML IV SOLN
INTRAVENOUS | Status: DC | PRN
  Administered 2016-08-09: 1000 mg via INTRAVENOUS

## 2016-08-09 MED ORDER — TETRAHYDROZOLINE HCL 0.05 % OP SOLN: 1.0000 [drp] | Freq: Every day | OPHTHALMIC | Status: DC

## 2016-08-09 MED ORDER — ENOXAPARIN SODIUM 40 MG/0.4ML ~~LOC~~ SOLN
40.0000 mg | SUBCUTANEOUS | Status: DC
  Administered 2016-08-10: 40 mg via SUBCUTANEOUS
  Filled 2016-08-09: qty 0.4

## 2016-08-09 MED ORDER — DEXTROSE 5 % IV SOLN
INTRAVENOUS | Status: DC | PRN
  Administered 2016-08-09: 45 ug/min via INTRAVENOUS

## 2016-08-09 MED ORDER — METHOCARBAMOL 500 MG PO TABS: 500.0000 mg | ORAL_TABLET | Freq: Four times a day (QID) | ORAL | Status: DC | PRN

## 2016-08-09 MED ORDER — ACETAMINOPHEN 325 MG PO TABS: 650.0000 mg | ORAL_TABLET | Freq: Four times a day (QID) | ORAL | Status: DC | PRN

## 2016-08-09 MED ORDER — PROPOFOL 10 MG/ML IV BOLUS
INTRAVENOUS | Status: AC
  Filled 2016-08-09: qty 20

## 2016-08-09 MED ORDER — VITAMIN D3 25 MCG (1000 UNIT) PO TABS
2000.0000 [IU] | ORAL_TABLET | Freq: Every day | ORAL | Status: DC
  Administered 2016-08-09 – 2016-08-10 (×2): 2000 [IU] via ORAL
  Filled 2016-08-09 (×4): qty 2

## 2016-08-09 MED ORDER — LIDOCAINE HCL (CARDIAC) 20 MG/ML IV SOLN
INTRAVENOUS | Status: DC | PRN
  Administered 2016-08-09: 100 mg via INTRAVENOUS

## 2016-08-09 MED ORDER — CEFAZOLIN SODIUM-DEXTROSE 2-4 GM/100ML-% IV SOLN
INTRAVENOUS | Status: AC
  Filled 2016-08-09: qty 100

## 2016-08-09 MED ORDER — CEFAZOLIN SODIUM-DEXTROSE 2-4 GM/100ML-% IV SOLN
2.0000 g | Freq: Four times a day (QID) | INTRAVENOUS | Status: AC
  Administered 2016-08-09 – 2016-08-10 (×3): 2 g via INTRAVENOUS
  Filled 2016-08-09 (×3): qty 100

## 2016-08-09 MED ORDER — MIDAZOLAM HCL 2 MG/2ML IJ SOLN
INTRAMUSCULAR | Status: AC
  Administered 2016-08-09: 1 mg via INTRAVENOUS
  Filled 2016-08-09: qty 2

## 2016-08-09 MED ORDER — CEFAZOLIN SODIUM-DEXTROSE 2-4 GM/100ML-% IV SOLN
2.0000 g | INTRAVENOUS | Status: AC
  Administered 2016-08-09: 2 g via INTRAVENOUS

## 2016-08-09 MED ORDER — HYDROCODONE-ACETAMINOPHEN 5-325 MG PO TABS: 1.0000 | ORAL_TABLET | ORAL | Status: DC | PRN

## 2016-08-09 MED ORDER — MIDAZOLAM HCL 5 MG/5ML IJ SOLN
INTRAMUSCULAR | Status: DC | PRN
  Administered 2016-08-09: 2 mg via INTRAVENOUS

## 2016-08-09 MED ORDER — METOCLOPRAMIDE HCL 5 MG/ML IJ SOLN: 5.0000 mg | Freq: Three times a day (TID) | INTRAMUSCULAR | Status: DC | PRN

## 2016-08-09 MED ORDER — CEFAZOLIN SODIUM-DEXTROSE 2-4 GM/100ML-% IV SOLN
2.0000 g | Freq: Four times a day (QID) | INTRAVENOUS | Status: DC
  Filled 2016-08-09 (×3): qty 100

## 2016-08-09 MED ORDER — EPINEPHRINE PF 1 MG/ML IJ SOLN
INTRAMUSCULAR | Status: AC
  Filled 2016-08-09: qty 1

## 2016-08-09 MED ORDER — DEXAMETHASONE SODIUM PHOSPHATE 10 MG/ML IJ SOLN
INTRAMUSCULAR | Status: AC
  Filled 2016-08-09: qty 1

## 2016-08-09 MED ORDER — MAGNESIUM OXIDE 400 (241.3 MG) MG PO TABS
400.0000 mg | ORAL_TABLET | Freq: Every day | ORAL | Status: DC
  Administered 2016-08-09: 400 mg via ORAL
  Filled 2016-08-09: qty 1

## 2016-08-09 MED ORDER — ONDANSETRON HCL 4 MG/2ML IJ SOLN
INTRAMUSCULAR | Status: DC | PRN
  Administered 2016-08-09: 4 mg via INTRAVENOUS

## 2016-08-09 MED ORDER — FENTANYL CITRATE (PF) 250 MCG/5ML IJ SOLN
INTRAMUSCULAR | Status: AC
  Filled 2016-08-09: qty 5

## 2016-08-09 MED ORDER — DEXAMETHASONE SODIUM PHOSPHATE 10 MG/ML IJ SOLN
INTRAMUSCULAR | Status: DC | PRN
  Administered 2016-08-09: 10 mg via INTRAVENOUS

## 2016-08-09 SURGICAL SUPPLY — 63 items
BANDAGE ESMARK 6X9 LF (GAUZE/BANDAGES/DRESSINGS) IMPLANT
BNDG COHESIVE 4X5 TAN STRL (GAUZE/BANDAGES/DRESSINGS) IMPLANT
BNDG ELASTIC 6X10 VLCR STRL LF (GAUZE/BANDAGES/DRESSINGS) ×2 IMPLANT
BNDG ESMARK 6X9 LF (GAUZE/BANDAGES/DRESSINGS)
BNDG GAUZE ELAST 4 BULKY (GAUZE/BANDAGES/DRESSINGS) ×2 IMPLANT
BUR RND DIAMOND ELITE 4.0 (BURR) ×2 IMPLANT
COVER SURGICAL LIGHT HANDLE (MISCELLANEOUS) ×4 IMPLANT
CUFF TOURNIQUET SINGLE 34IN LL (TOURNIQUET CUFF) IMPLANT
CUFF TOURNIQUET SINGLE 44IN (TOURNIQUET CUFF) IMPLANT
DRAPE C-ARM 42X72 X-RAY (DRAPES) IMPLANT
DRAPE INCISE IOBAN 66X45 STRL (DRAPES) IMPLANT
DRAPE ORTHO SPLIT 77X108 STRL (DRAPES)
DRAPE SURG ORHT 6 SPLT 77X108 (DRAPES) IMPLANT
DRAPE U-SHAPE 47X51 STRL (DRAPES) ×2 IMPLANT
DRSG AQUACEL AG ADV 3.5X10 (GAUZE/BANDAGES/DRESSINGS) ×2 IMPLANT
DRSG EMULSION OIL 3X3 NADH (GAUZE/BANDAGES/DRESSINGS) ×2 IMPLANT
DRSG PAD ABDOMINAL 8X10 ST (GAUZE/BANDAGES/DRESSINGS) ×2 IMPLANT
DURAPREP 26ML APPLICATOR (WOUND CARE) ×2 IMPLANT
ELECT REM PT RETURN 9FT ADLT (ELECTROSURGICAL) ×2
ELECTRODE REM PT RTRN 9FT ADLT (ELECTROSURGICAL) ×1 IMPLANT
GAUZE SPONGE 4X4 12PLY STRL (GAUZE/BANDAGES/DRESSINGS) ×2 IMPLANT
GLOVE BIO SURGEON STRL SZ8.5 (GLOVE) ×4 IMPLANT
GLOVE BIOGEL PI IND STRL 6.5 (GLOVE) ×1 IMPLANT
GLOVE BIOGEL PI IND STRL 8.5 (GLOVE) ×1 IMPLANT
GLOVE BIOGEL PI IND STRL 9 (GLOVE) ×1 IMPLANT
GLOVE BIOGEL PI INDICATOR 6.5 (GLOVE) ×1
GLOVE BIOGEL PI INDICATOR 8.5 (GLOVE) ×1
GLOVE BIOGEL PI INDICATOR 9 (GLOVE) ×1
GLOVE ECLIPSE 7.0 STRL STRAW (GLOVE) ×2 IMPLANT
GLOVE ECLIPSE 8.0 STRL XLNG CF (GLOVE) ×2 IMPLANT
GLOVE SURG ORTHO 9.0 STRL STRW (GLOVE) ×2 IMPLANT
GLOVE SURG SS PI 8.0 STRL IVOR (GLOVE) ×6 IMPLANT
GOWN STRL REUS W/ TWL XL LVL3 (GOWN DISPOSABLE) ×3 IMPLANT
GOWN STRL REUS W/TWL XL LVL3 (GOWN DISPOSABLE) ×3
HANDPIECE INTERPULSE COAX TIP (DISPOSABLE) ×1
IMMOBILIZER KNEE 22 UNIV (SOFTGOODS) ×2 IMPLANT
KIT BASIN OR (CUSTOM PROCEDURE TRAY) ×2 IMPLANT
KIT ROOM TURNOVER OR (KITS) ×2 IMPLANT
MANIFOLD NEPTUNE II (INSTRUMENTS) ×2 IMPLANT
NEEDLE FILTER BLUNT 18X 1/2SAF (NEEDLE) ×1
NEEDLE FILTER BLUNT 18X1 1/2 (NEEDLE) ×1 IMPLANT
NS IRRIG 1000ML POUR BTL (IV SOLUTION) ×2 IMPLANT
PACK ORTHO EXTREMITY (CUSTOM PROCEDURE TRAY) ×2 IMPLANT
PAD ARMBOARD 7.5X6 YLW CONV (MISCELLANEOUS) ×4 IMPLANT
SET HNDPC FAN SPRY TIP SCT (DISPOSABLE) ×1 IMPLANT
SPONGE LAP 18X18 X RAY DECT (DISPOSABLE) IMPLANT
STAPLER VISISTAT 35W (STAPLE) IMPLANT
STOCKINETTE IMPERVIOUS 9X36 MD (GAUZE/BANDAGES/DRESSINGS) IMPLANT
SUT ETHILON 2 0 PSLX (SUTURE) IMPLANT
SUT ETHILON 3 0 PS 1 (SUTURE) ×4 IMPLANT
SUT MNCRL AB 3-0 PS2 18 (SUTURE) ×2 IMPLANT
SUT MON AB 2-0 CT1 36 (SUTURE) ×4 IMPLANT
SUT VIC AB 0 CT1 27 (SUTURE)
SUT VIC AB 0 CT1 27XBRD ANBCTR (SUTURE) IMPLANT
SUT VIC AB 1 CT1 27 (SUTURE) ×2
SUT VIC AB 1 CT1 27XBRD ANBCTR (SUTURE) ×2 IMPLANT
SUT VIC AB 2-0 CT1 27 (SUTURE)
SUT VIC AB 2-0 CT1 TAPERPNT 27 (SUTURE) IMPLANT
SYR TB 1ML LUER SLIP (SYRINGE) ×2 IMPLANT
TOWEL OR 17X24 6PK STRL BLUE (TOWEL DISPOSABLE) ×2 IMPLANT
TOWEL OR 17X26 10 PK STRL BLUE (TOWEL DISPOSABLE) ×2 IMPLANT
UNDERPAD 30X30 (UNDERPADS AND DIAPERS) ×2 IMPLANT
WATER STERILE IRR 1000ML POUR (IV SOLUTION) ×2 IMPLANT

## 2016-08-09 NOTE — Transfer of Care (Signed)
Immediate Anesthesia Transfer of Care Note  Patient: R Kathleene Hazel  Procedure(s) Performed: Procedure(s): HARDWARE REMOVAL RIGHT KNEE (Right)  Patient Location: PACU  Anesthesia Type:General, Regional and GA combined with regional for post-op pain  Level of Consciousness: awake, oriented, sedated, patient cooperative and responds to stimulation  Airway & Oxygen Therapy: Patient Spontanous Breathing and Patient connected to nasal cannula oxygen  Post-op Assessment: Report given to RN, Post -op Vital signs reviewed and stable, Patient moving all extremities and Patient moving all extremities X 4  Post vital signs: Reviewed and stable  Last Vitals:  Vitals:   08/09/16 1340 08/09/16 1630  BP: 119/66 115/69  Pulse: 65 66  Resp: 14 (!) 9  Temp:  36.3 C    Last Pain:  Vitals:   08/09/16 1340  TempSrc:   PainSc: 0-No pain      Patients Stated Pain Goal: 3 (68/03/21 2248)  Complications: No apparent anesthesia complications

## 2016-08-09 NOTE — Addendum Note (Signed)
Addendum  created 08/09/16 1720 by Lillia Abed, MD   Anesthesia Attestations filed

## 2016-08-09 NOTE — Anesthesia Procedure Notes (Signed)
Procedure Name: LMA Insertion Date/Time: 08/09/2016 2:29 PM Performed by: Jacquiline Doe A Pre-anesthesia Checklist: Patient identified, Emergency Drugs available, Suction available and Patient being monitored Patient Re-evaluated:Patient Re-evaluated prior to inductionOxygen Delivery Method: Circle System Utilized Preoxygenation: Pre-oxygenation with 100% oxygen Intubation Type: IV induction Ventilation: Mask ventilation without difficulty LMA: LMA inserted LMA Size: 4.0 Tube type: Oral Number of attempts: 1 Placement Confirmation: positive ETCO2 Tube secured with: Tape Dental Injury: Teeth and Oropharynx as per pre-operative assessment

## 2016-08-09 NOTE — Interval H&P Note (Signed)
History and Physical Interval Note:  08/09/2016 1:01 PM  R ANDRIS BROTHERS  has presented today for surgery, with the diagnosis of Retained hardware right knee  The various methods of treatment have been discussed with the patient and family. After consideration of risks, benefits and other options for treatment, the patient has consented to  Procedure(s): HARDWARE REMOVAL RIGHT KNEE (Right) as a surgical intervention .  The patient's history has been reviewed, patient examined, no change in status, stable for surgery.  I have reviewed the patient's chart and labs.  Questions were answered to the patient's satisfaction.     Gift Rueckert, Horald Pollen

## 2016-08-09 NOTE — Brief Op Note (Signed)
08/09/2016  3:59 PM  PATIENT:  Philip Jackson  66 y.o. male  PRE-OPERATIVE DIAGNOSIS:  Retained hardware right knee  POST-OPERATIVE DIAGNOSIS:  Retained hardware right knee  PROCEDURE:  Procedure(s): HARDWARE REMOVAL RIGHT KNEE (Right)  SURGEON:  Surgeon(s) and Role:    * Rod Can, MD - Primary  PHYSICIAN ASSISTANT:   ASSISTANTS: Ky Barban, RNFA   ANESTHESIA:   regional and general  EBL:  Total I/O In: 1100 [I.V.:1100] Out: 100 [Blood:100]  BLOOD ADMINISTERED:none  DRAINS: none   LOCAL MEDICATIONS USED:  MARCAINE     SPECIMEN:  No Specimen  DISPOSITION OF SPECIMEN:  N/A  COUNTS:  YES  TOURNIQUET:   Total Tourniquet Time Documented: Thigh (Right) - 55 minutes Total: Thigh (Right) - 55 minutes   DICTATION: .Other Dictation: Dictation Number [id number]  PLAN OF CARE: Admit for overnight observation  PATIENT DISPOSITION:  PACU - hemodynamically stable.   Delay start of Pharmacological VTE agent (>24hrs) due to surgical blood loss or risk of bleeding: no

## 2016-08-09 NOTE — Anesthesia Procedure Notes (Addendum)
Anesthesia Regional Block: Adductor canal block   Pre-Anesthetic Checklist: ,, timeout performed, Correct Patient, Correct Site, Correct Laterality, Correct Procedure, Correct Position, site marked, Risks and benefits discussed,  Surgical consent,  Pre-op evaluation,  At surgeon's request and post-op pain management  Laterality: Right and Lower  Prep: chloraprep       Needles:   Needle Type: Echogenic Stimulator Needle     Needle Length: 9cm    Needle insertion depth: 6 cm   Additional Needles:   Procedures: ultrasound guided,,,,,,,,  Narrative:  Start time: 08/09/2016 1:15 PM End time: 08/09/2016 1:29 PM Injection made incrementally with aspirations every 5 mL.  Performed by: Personally  Anesthesiologist: Mardene Lessig
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
## 2016-08-09 NOTE — Anesthesia Preprocedure Evaluation (Addendum)
Anesthesia Evaluation  Patient identified by MRN, date of birth, ID band Patient awake    Reviewed: Allergy & Precautions, NPO status , Patient's Chart, lab work & pertinent test results  History of Anesthesia Complications Negative for: history of anesthetic complications  Airway Mallampati: II   Neck ROM: Full    Dental no notable dental hx.    Pulmonary sleep apnea , former smoker,    breath sounds clear to auscultation       Cardiovascular hypertension,  Rhythm:Regular Rate:Normal     Neuro/Psych    GI/Hepatic GERD  ,  Endo/Other    Renal/GU      Musculoskeletal  (+) Arthritis ,   Abdominal (+) + obese,   Peds  Hematology   Anesthesia Other Findings   Reproductive/Obstetrics                            Anesthesia Physical Anesthesia Plan  ASA: III  Anesthesia Plan: General   Post-op Pain Management:    Induction: Intravenous  Airway Management Planned: LMA and Oral ETT  Additional Equipment:   Intra-op Plan:   Post-operative Plan: Extubation in OR  Informed Consent: I have reviewed the patients History and Physical, chart, labs and discussed the procedure including the risks, benefits and alternatives for the proposed anesthesia with the patient or authorized representative who has indicated his/her understanding and acceptance.   Dental advisory given  Plan Discussed with: CRNA  Anesthesia Plan Comments:         Anesthesia Quick Evaluation

## 2016-08-09 NOTE — Anesthesia Postprocedure Evaluation (Signed)
Anesthesia Post Note  Patient: Philip Jackson  Procedure(s) Performed: Procedure(s) (LRB): HARDWARE REMOVAL RIGHT KNEE (Right)  Patient location during evaluation: PACU Anesthesia Type: General Level of consciousness: awake and alert Pain management: pain level controlled Vital Signs Assessment: post-procedure vital signs reviewed and stable Respiratory status: spontaneous breathing, nonlabored ventilation, respiratory function stable and patient connected to nasal cannula oxygen Cardiovascular status: blood pressure returned to baseline and stable Postop Assessment: no signs of nausea or vomiting Anesthetic complications: no       Last Vitals:  Vitals:   08/09/16 1700 08/09/16 1705  BP: 118/74   Pulse: 63 63  Resp: 11 12  Temp:      Last Pain:  Vitals:   08/09/16 1700  TempSrc:   PainSc: 0-No pain                 Olivier Frayre DAVID

## 2016-08-09 NOTE — Discharge Instructions (Signed)
Wear knee brace at all times.  There is no restriction to range of motion. 50% weightbearing right lower extremity with walker. Keep dressing clean and dry. Do not remove.

## 2016-08-10 ENCOUNTER — Encounter (HOSPITAL_COMMUNITY): Payer: Self-pay | Admitting: Orthopedic Surgery

## 2016-08-10 DIAGNOSIS — G4730 Sleep apnea, unspecified: Secondary | ICD-10-CM | POA: Diagnosis not present

## 2016-08-10 DIAGNOSIS — K219 Gastro-esophageal reflux disease without esophagitis: Secondary | ICD-10-CM | POA: Diagnosis not present

## 2016-08-10 DIAGNOSIS — T8484XA Pain due to internal orthopedic prosthetic devices, implants and grafts, initial encounter: Secondary | ICD-10-CM | POA: Diagnosis not present

## 2016-08-10 DIAGNOSIS — I4891 Unspecified atrial fibrillation: Secondary | ICD-10-CM | POA: Diagnosis not present

## 2016-08-10 DIAGNOSIS — Z85828 Personal history of other malignant neoplasm of skin: Secondary | ICD-10-CM | POA: Diagnosis not present

## 2016-08-10 DIAGNOSIS — D649 Anemia, unspecified: Secondary | ICD-10-CM | POA: Diagnosis not present

## 2016-08-10 DIAGNOSIS — Z87891 Personal history of nicotine dependence: Secondary | ICD-10-CM | POA: Diagnosis not present

## 2016-08-10 DIAGNOSIS — I10 Essential (primary) hypertension: Secondary | ICD-10-CM | POA: Diagnosis not present

## 2016-08-10 MED ORDER — ONDANSETRON HCL 4 MG PO TABS: 4.0000 mg | ORAL_TABLET | Freq: Four times a day (QID) | ORAL | 0 refills | Status: DC | PRN

## 2016-08-10 MED ORDER — SENNA 8.6 MG PO TABS: 1.0000 | ORAL_TABLET | Freq: Two times a day (BID) | ORAL | 0 refills | Status: DC

## 2016-08-10 MED ORDER — DOCUSATE SODIUM 100 MG PO CAPS: 100.0000 mg | ORAL_CAPSULE | Freq: Two times a day (BID) | ORAL | 0 refills | Status: DC

## 2016-08-10 MED ORDER — ASPIRIN 81 MG PO TABS: 81.0000 mg | ORAL_TABLET | Freq: Two times a day (BID) | ORAL | 1 refills | Status: DC

## 2016-08-10 MED ORDER — HYDROCODONE-ACETAMINOPHEN 5-325 MG PO TABS: 1.0000 | ORAL_TABLET | ORAL | 0 refills | Status: DC | PRN

## 2016-08-10 NOTE — Progress Notes (Signed)
Orthopedic Tech Progress Note Patient Details:  Philip Jackson [birth year] [id number]  Patient ID: [name], male   DOB: [birth year], 66 y.o.   MRN: [id number]   [name] 08/10/2016, 9:05 AM Called  In bio-tech brace order; spoke with [facility]

## 2016-08-10 NOTE — Progress Notes (Signed)
All discharge instructions reviewed with pt and wife with understanding verbalized by both.  Pt denies any questions or concerns prior to discharge.  Prescriptions provided.  Bledsoe brace in place to right leg.  VSS.  Pt discharged home in stable condition at approx 1405 with wife.
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
## 2016-08-10 NOTE — Progress Notes (Signed)
   Subjective:  Patient reports pain as mild to moderate.  Denies N/V/CP/SOB.  Objective:   VITALS:   Vitals:   08/09/16 1744 08/09/16 2044 08/10/16 0219 08/10/16 0457  BP: 115/68 108/62 132/63 130/72  Pulse: (!) 57 (!) 57 (!) 59 (!) 59  Resp: 16 16 16 17   Temp: 97.4 F (36.3 C) 97.9 F (36.6 C) 97.5 F (36.4 C) 97.8 F (36.6 C)  TempSrc:  Oral Oral Oral  SpO2: 96% 96% 96% 95%  Weight:      Height:        NAD ABD soft Intact pulses distally Dorsiflexion/Plantar flexion intact Incision: dressing C/D/I Compartment soft   Lab Results  Component Value Date   WBC 13.2 (H) 08/09/2016   HGB 13.4 08/09/2016   HCT 41.1 08/09/2016   MCV 91.5 08/09/2016   PLT 300 08/09/2016   BMET    Component Value Date/Time   NA 140 08/03/2016 0827   K 4.2 08/03/2016 0827   CL 106 08/03/2016 0827   CO2 24 08/03/2016 0827   GLUCOSE 101 (H) 08/03/2016 0827   BUN 9 08/03/2016 0827   CREATININE 0.89 08/09/2016 1928   CALCIUM 9.1 08/03/2016 0827   GFRNONAA >60 08/09/2016 1928   GFRAA >60 08/09/2016 1928     Assessment/Plan: 1 Day Post-Op   Principal Problem:   Painful orthopaedic hardware (Jackson) Active Problems:   Retained orthopedic hardware   50% WB with walker Bledsoe brace today Unrestricted ROM in brace DVT ppx: lovenox in house, home on ASA PO pain control PT Dispo:d/c home after clears therapy and receives brace, likely today   Elie Goody 08/10/2016, 7:47 AM   Rod Can, MD Cell [phone]
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
## 2016-08-10 NOTE — Care Management Note (Addendum)
Case Management Note  Patient Details  Name: Philip Jackson MRN: [id number] Date of Birth: [birth year]  Subjective/Objective:                    Action/Plan:  Spoke with patient and wife at bedside. They already have 3 in 1 and walker at home. They state PT has worked with him this am, no note yet , paged same for recommendations.   [facility] received referral for hh for MD office.  Spoke with Mickel Baas with PT , recommending outpatient therapy when cleared by MD. Aniceto Boss at Dr Lyla Glassing aware, awaiting call back. Patient and wife aware. Maudie Mercury from MD office and called and confirmed no home health  Expected Discharge Date:  08/10/16               Expected Discharge Plan:     In-House Referral:     Discharge planning Services  CM Consult  Post Acute Care Choice:    Choice offered to:  Patient, Spouse  DME Arranged:    DME Agency:     HH Arranged:    [place] Agency:     Status of Service:  In process, will continue to follow  If discussed at Long Length of Stay Meetings, dates discussed:    Additional Comments:  Marilu Favre, RN 08/10/2016, 11:20 AM
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
## 2016-08-24 DIAGNOSIS — M1711 Unilateral primary osteoarthritis, right knee: Secondary | ICD-10-CM | POA: Diagnosis not present

## 2016-09-21 DIAGNOSIS — M1711 Unilateral primary osteoarthritis, right knee: Secondary | ICD-10-CM | POA: Diagnosis not present

## 2016-11-02 DIAGNOSIS — M1731 Unilateral post-traumatic osteoarthritis, right knee: Secondary | ICD-10-CM | POA: Diagnosis not present

## 2016-11-15 DIAGNOSIS — Z8042 Family history of malignant neoplasm of prostate: Secondary | ICD-10-CM | POA: Diagnosis not present

## 2016-11-15 DIAGNOSIS — N400 Enlarged prostate without lower urinary tract symptoms: Secondary | ICD-10-CM | POA: Diagnosis not present

## 2016-11-15 DIAGNOSIS — R972 Elevated prostate specific antigen [PSA]: Secondary | ICD-10-CM | POA: Diagnosis not present

## 2016-11-15 DIAGNOSIS — N528 Other male erectile dysfunction: Secondary | ICD-10-CM | POA: Diagnosis not present

## 2016-11-26 ENCOUNTER — Other Ambulatory Visit: Payer: Self-pay | Admitting: Family Medicine

## 2016-11-26 DIAGNOSIS — M25511 Pain in right shoulder: Secondary | ICD-10-CM

## 2016-11-30 ENCOUNTER — Ambulatory Visit
Admission: RE | Admit: 2016-11-30 | Discharge: 2016-11-30 | Disposition: A | Discharge status: Home / Self Care | Payer: BLUE CROSS/BLUE SHIELD | Source: Ambulatory Visit | Attending: Family Medicine | Admitting: Family Medicine

## 2016-11-30 DIAGNOSIS — M25511 Pain in right shoulder: Secondary | ICD-10-CM | POA: Diagnosis not present

## 2017-02-16 DIAGNOSIS — G4733 Obstructive sleep apnea (adult) (pediatric): Secondary | ICD-10-CM | POA: Diagnosis not present

## 2017-05-13 DIAGNOSIS — N401 Enlarged prostate with lower urinary tract symptoms: Secondary | ICD-10-CM | POA: Diagnosis not present

## 2017-05-13 DIAGNOSIS — N138 Other obstructive and reflux uropathy: Secondary | ICD-10-CM | POA: Diagnosis not present

## 2017-05-13 DIAGNOSIS — R972 Elevated prostate specific antigen [PSA]: Secondary | ICD-10-CM | POA: Diagnosis not present

## 2017-05-13 DIAGNOSIS — Z8042 Family history of malignant neoplasm of prostate: Secondary | ICD-10-CM | POA: Diagnosis not present

## 2017-06-20 DIAGNOSIS — H40013 Open angle with borderline findings, low risk, bilateral: Secondary | ICD-10-CM | POA: Diagnosis not present

## 2017-06-20 DIAGNOSIS — H35033 Hypertensive retinopathy, bilateral: Secondary | ICD-10-CM | POA: Diagnosis not present

## 2017-06-20 DIAGNOSIS — H25013 Cortical age-related cataract, bilateral: Secondary | ICD-10-CM | POA: Diagnosis not present

## 2017-06-20 DIAGNOSIS — H2513 Age-related nuclear cataract, bilateral: Secondary | ICD-10-CM | POA: Diagnosis not present

## 2017-06-20 DIAGNOSIS — H35363 Drusen (degenerative) of macula, bilateral: Secondary | ICD-10-CM | POA: Diagnosis not present
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
## 2017-07-18 ENCOUNTER — Encounter (INDEPENDENT_AMBULATORY_CARE_PROVIDER_SITE_OTHER): Payer: Self-pay

## 2017-07-18 ENCOUNTER — Ambulatory Visit: Payer: BLUE CROSS/BLUE SHIELD | Admitting: Interventional Cardiology

## 2017-07-18 ENCOUNTER — Encounter: Payer: Self-pay | Admitting: Interventional Cardiology

## 2017-07-18 VITALS — BP 126/70 | HR 77 | Ht 68.0 in | Wt 202.8 lb

## 2017-07-18 DIAGNOSIS — E7849 Other hyperlipidemia: Secondary | ICD-10-CM | POA: Diagnosis not present

## 2017-07-18 DIAGNOSIS — I10 Essential (primary) hypertension: Secondary | ICD-10-CM

## 2017-07-18 DIAGNOSIS — I4891 Unspecified atrial fibrillation: Secondary | ICD-10-CM

## 2017-07-18 MED ORDER — APIXABAN 5 MG PO TABS: 5.0000 mg | ORAL_TABLET | Freq: Two times a day (BID) | ORAL | 3 refills | Status: DC

## 2017-07-18 NOTE — Patient Instructions (Signed)
Medication Instructions:  1) DISCONTINUE Aspirin 2) START Eliquis 5mg  twice daily  Labwork: None  Testing/Procedures: None  Follow-Up: Your physician wants you to follow-up in: 6 months with Dr. Tamala Julian.  You will receive a reminder letter in the mail two months in advance. If you don't receive a letter, please call our office to schedule the follow-up appointment.   Any Other Special Instructions Will Be Listed Below (If Applicable).     If you need a refill on your cardiac medications before your next appointment, please call your pharmacy.

## 2017-08-03 DIAGNOSIS — G4733 Obstructive sleep apnea (adult) (pediatric): Secondary | ICD-10-CM | POA: Diagnosis not present

## 2017-08-09 ENCOUNTER — Other Ambulatory Visit: Payer: Self-pay | Admitting: Family Medicine

## 2017-08-09 ENCOUNTER — Ambulatory Visit
Admission: RE | Admit: 2017-08-09 | Discharge: 2017-08-09 | Disposition: A | Discharge status: Home / Self Care | Payer: BLUE CROSS/BLUE SHIELD | Source: Ambulatory Visit | Attending: Family Medicine | Admitting: Family Medicine

## 2017-08-09 DIAGNOSIS — M25562 Pain in left knee: Secondary | ICD-10-CM

## 2017-08-09 DIAGNOSIS — M19012 Primary osteoarthritis, left shoulder: Secondary | ICD-10-CM | POA: Diagnosis not present

## 2017-08-09 DIAGNOSIS — M25512 Pain in left shoulder: Secondary | ICD-10-CM

## 2017-08-09 DIAGNOSIS — M1712 Unilateral primary osteoarthritis, left knee: Secondary | ICD-10-CM | POA: Diagnosis not present

## 2017-11-04 DIAGNOSIS — N401 Enlarged prostate with lower urinary tract symptoms: Secondary | ICD-10-CM | POA: Diagnosis not present

## 2017-11-04 DIAGNOSIS — Z8042 Family history of malignant neoplasm of prostate: Secondary | ICD-10-CM | POA: Diagnosis not present

## 2017-11-04 DIAGNOSIS — R972 Elevated prostate specific antigen [PSA]: Secondary | ICD-10-CM | POA: Diagnosis not present

## 2017-11-04 DIAGNOSIS — N138 Other obstructive and reflux uropathy: Secondary | ICD-10-CM | POA: Diagnosis not present

## 2018-01-10 ENCOUNTER — Encounter: Payer: Self-pay | Admitting: Interventional Cardiology
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
## 2018-01-24 ENCOUNTER — Encounter: Payer: Self-pay | Admitting: Interventional Cardiology

## 2018-01-24 ENCOUNTER — Encounter (INDEPENDENT_AMBULATORY_CARE_PROVIDER_SITE_OTHER): Payer: Self-pay

## 2018-01-24 ENCOUNTER — Ambulatory Visit: Payer: BLUE CROSS/BLUE SHIELD | Admitting: Interventional Cardiology

## 2018-01-24 VITALS — BP 132/80 | HR 58 | Ht 69.0 in | Wt 199.0 lb

## 2018-01-24 DIAGNOSIS — I10 Essential (primary) hypertension: Secondary | ICD-10-CM

## 2018-01-24 DIAGNOSIS — I4891 Unspecified atrial fibrillation: Secondary | ICD-10-CM | POA: Diagnosis not present

## 2018-01-24 DIAGNOSIS — E782 Mixed hyperlipidemia: Secondary | ICD-10-CM

## 2018-01-24 DIAGNOSIS — Z7901 Long term (current) use of anticoagulants: Secondary | ICD-10-CM

## 2018-01-24 DIAGNOSIS — G4733 Obstructive sleep apnea (adult) (pediatric): Secondary | ICD-10-CM | POA: Diagnosis not present

## 2018-01-24 NOTE — Patient Instructions (Signed)
Medication Instructions:  Your physician recommends that you continue on your current medications as directed. Please refer to the Current Medication list given to you today.  If you need a refill on your cardiac medications before your next appointment, please call your pharmacy.   Lab work: CBC and BMET in 6 months.   If you have labs (blood work) drawn today and your tests are completely normal, you will receive your results only by: Marland Kitchen MyChart Message (if you have MyChart) OR . A paper copy in the mail If you have any lab test that is abnormal or we need to change your treatment, we will call you to review the results.  Testing/Procedures: None  Follow-Up: At [facility], you and your health needs are our priority.  As part of our continuing mission to provide you with exceptional heart care, we have created designated Provider Care Teams.  These Care Teams include your primary Cardiologist (physician) and Advanced Practice Providers (APPs -  Physician Assistants and Nurse Practitioners) who all work together to provide you with the care you need, when you need it. You will need a follow up appointment in 1 year.  Please call our office 2 months in advance to schedule this appointment.  You may see Sinclair Grooms, MD or one of the following Advanced Practice Providers on your designated Care Team:   Truitt Merle, NP Cecilie Kicks, NP . Kathyrn Drown, NP  Any Other Special Instructions Will Be Listed Below (If Applicable).

## 2018-02-16 ENCOUNTER — Encounter

## 2018-02-23 DIAGNOSIS — G4733 Obstructive sleep apnea (adult) (pediatric): Secondary | ICD-10-CM | POA: Diagnosis not present

## 2018-05-05 DIAGNOSIS — Z8042 Family history of malignant neoplasm of prostate: Secondary | ICD-10-CM | POA: Diagnosis not present

## 2018-05-05 DIAGNOSIS — N401 Enlarged prostate with lower urinary tract symptoms: Secondary | ICD-10-CM | POA: Diagnosis not present

## 2018-05-05 DIAGNOSIS — R972 Elevated prostate specific antigen [PSA]: Secondary | ICD-10-CM | POA: Diagnosis not present

## 2018-05-05 DIAGNOSIS — N138 Other obstructive and reflux uropathy: Secondary | ICD-10-CM | POA: Diagnosis not present

## 2018-06-02 DIAGNOSIS — M1731 Unilateral post-traumatic osteoarthritis, right knee: Secondary | ICD-10-CM | POA: Diagnosis not present

## 2018-06-02 DIAGNOSIS — M25561 Pain in right knee: Secondary | ICD-10-CM | POA: Diagnosis not present

## 2018-06-07 ENCOUNTER — Telehealth: Payer: Self-pay | Admitting: *Deleted

## 2018-06-07 NOTE — Telephone Encounter (Signed)
Patient with diagnosis of afib on Eliquis for anticoagulation.    Procedure: RIGHT TOTAL KNEE  Date of procedure: 08/07/2018  CHADS2-VASc score of  2 (CHF, HTN, AGE, DM2, stroke/tia x 2, CAD, AGE, male)  Per office protocol, patient can hold Eliquis for 3 days prior to procedure.

## 2018-06-07 NOTE — Telephone Encounter (Signed)
   Primary Cardiologist: Sinclair Grooms, MD  Chart reviewed as part of pre-operative protocol coverage. Patient was contacted 06/07/2018 in reference to pre-operative risk assessment for pending surgery as outlined below.  Philip Jackson was last seen on 02/13/18 by Dr. Tamala Julian.  Since that day, Philip Jackson has done well. 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   Therefore, based on ACC/AHA guidelines, the patient would be at acceptable risk for the planned procedure without further cardiovascular testing.   I will route this recommendation to the requesting party via Epic fax function and remove from pre-op pool.  Please call with questions.  [place], [place] 06/07/2018, 1:55 PM

## 2018-06-07 NOTE — Telephone Encounter (Signed)
   [facility] Pre-operative Risk Assessment    Request for surgical clearance:  1. What type of surgery is being performed? RIGHT TOTAL KNEE   2. When is this surgery scheduled? 08/07/18   3. What type of clearance is required (medical clearance vs. Pharmacy clearance to hold med vs. Both)? BOTH  4. Are there any medications that need to be held prior to surgery and how long?ELIQUIS   5. Practice name and name of physician performing surgery? [facility]; DR. Wynelle Link   6. What is your office phone number [phone]    7.   What is your office fax number [phone]  8.   Anesthesia type (None, local, MAC, general) ? CHOICE   Philip Jackson 06/07/2018, 12:49 PM  _________________________________________________________________   (provider comments below)

## 2018-06-21 ENCOUNTER — Telehealth: Payer: Self-pay | Admitting: *Deleted

## 2018-06-21 ENCOUNTER — Ambulatory Visit: Payer: BLUE CROSS/BLUE SHIELD | Admitting: Nurse Practitioner

## 2018-06-21 NOTE — Telephone Encounter (Signed)
S/w pt this am who was on Truitt Merle, NP schedule, was doubled booked for a surgical clearance.  Pt was in parking lot for an 8:00 am appt.  According to the records in pre-op pt has been cleared and all forms have been sent to surgeon.  Will cancel pt's appt.
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
## 2018-06-22 DIAGNOSIS — H35033 Hypertensive retinopathy, bilateral: Secondary | ICD-10-CM | POA: Diagnosis not present

## 2018-06-22 DIAGNOSIS — H35363 Drusen (degenerative) of macula, bilateral: Secondary | ICD-10-CM | POA: Diagnosis not present

## 2018-06-22 DIAGNOSIS — H2513 Age-related nuclear cataract, bilateral: Secondary | ICD-10-CM | POA: Diagnosis not present

## 2018-06-22 DIAGNOSIS — H40013 Open angle with borderline findings, low risk, bilateral: Secondary | ICD-10-CM | POA: Diagnosis not present

## 2018-06-22 DIAGNOSIS — H25013 Cortical age-related cataract, bilateral: Secondary | ICD-10-CM | POA: Diagnosis not present

## 2018-08-07 ENCOUNTER — Encounter (HOSPITAL_COMMUNITY): Admission: RE | Payer: Self-pay | Source: Home / Self Care

## 2018-08-07 ENCOUNTER — Inpatient Hospital Stay (HOSPITAL_COMMUNITY)
Admission: RE | Admit: 2018-08-07 | Payer: BLUE CROSS/BLUE SHIELD | Source: Home / Self Care | Admitting: Orthopedic Surgery

## 2018-08-07 SURGERY — ARTHROPLASTY, KNEE, TOTAL
Anesthesia: Choice | Laterality: Right

## 2018-08-21 ENCOUNTER — Other Ambulatory Visit: Payer: Self-pay | Admitting: Interventional Cardiology

## 2018-08-21 NOTE — Telephone Encounter (Signed)
Age 68, weight 90kg, SCr 0.74 on 01/17/18.

## 2018-09-13 DIAGNOSIS — G4733 Obstructive sleep apnea (adult) (pediatric): Secondary | ICD-10-CM | POA: Diagnosis not present
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
## 2018-09-18 NOTE — Patient Instructions (Addendum)
PANCHO RUSHING  09/18/2018    Your procedure is scheduled on: Monday 09/25/2018              Report to [facility] Main  Entrance              Report to admitting at   1210 PM               [facility] TEST ON___6-4-2020____, THIS TEST MUST BE DONE BEFORE SURGERY!             COME TO [place]   Call this number if you have problems the morning of surgery [phone]              Please bring CPAP mask and tubing with you to the hospital!   Remember: Do not eat food  :After Midnight.   NO SOLID FOOD AFTER MIDNIGHT THE NIGHT PRIOR TO SURGERY. NOTHING BY MOUTH EXCEPT CLEAR LIQUIDS UNTIL 0850 am.  PLEASE FINISH ENSURE DRINK PER SURGEON ORDER  WHICH NEEDS TO BE COMPLETED AT  0430 am.    CLEAR LIQUID DIET   Foods Allowed                                                                     Foods Excluded  Coffee and tea, regular and decaf                             liquids that you cannot  Plain Jell-O in any flavor                                             see through such as: Fruit ices (not with fruit pulp)                                     milk, soups, orange juice  Iced Popsicles                                    All solid food Carbonated beverages, regular and diet                                    Cranberry, grape and apple juices Sports drinks like Gatorade Lightly seasoned clear broth or consume(fat free) Sugar, honey syrup  Sample Menu Breakfast                                Lunch                                     Supper  Cranberry juice                    Beef broth                            Chicken broth Jell-O                                     Grape juice                           Apple juice Coffee or tea                        Jell-O                                      Popsicle                                                Coffee or tea                        Coffee or  tea  _____________________________________________________________________               BRUSH YOUR TEETH MORNING OF SURGERY AND RINSE YOUR MOUTH OUT, NO CHEWING GUM CANDY OR MINTS.     Take these medicines the morning of surgery with A SIP OF WATER: Diltiazem (Cardizem CD), use eye drops as needed                                  You may not have any metal on your body including hair pins and              piercings  Do not wear jewelry, make-up, lotions, powders or perfumes, deodorant                        Men may shave face and neck.   Do not bring valuables to the hospital. [place].  Contacts, dentures or bridgework may not be worn into surgery.  Leave suitcase in the car. After surgery it may be brought to your room.                  Please read over the following fact sheets you were given: _____________________________________________________________________             [facility] - Preparing for Surgery Before surgery, you can play an important role.  Because skin is not sterile, your skin needs to be as free of germs as possible.  You can reduce the number of germs on your skin by washing with CHG (chlorahexidine gluconate) soap before surgery.  CHG is an antiseptic cleaner which kills germs and bonds with the skin to continue killing germs even after washing. Please DO NOT use if you have an allergy to CHG or antibacterial soaps.  If your skin becomes reddened/irritated stop using the CHG and inform your nurse when you arrive at Short Stay. Do not shave (including legs and underarms) for at least 48 hours prior to the first CHG shower.  You may shave your face/neck. Please follow these instructions carefully:  1.  Shower with CHG Soap the night before surgery and the  morning of Surgery.  2.  If you choose to wash your hair, wash your hair first as usual with your  normal  shampoo.  3.  After you shampoo, rinse your  hair and body thoroughly to remove the  shampoo.                           4.  Use CHG as you would any other liquid soap.  You can apply chg directly  to the skin and wash                       Gently with a scrungie or clean washcloth.  5.  Apply the CHG Soap to your body ONLY FROM THE NECK DOWN.   Do not use on face/ open                           Wound or open sores. Avoid contact with eyes, ears mouth and genitals (private parts).                       Wash face,  Genitals (private parts) with your normal soap.             6.  Wash thoroughly, paying special attention to the area where your surgery  will be performed.  7.  Thoroughly rinse your body with warm water from the neck down.  8.  DO NOT shower/wash with your normal soap after using and rinsing off  the CHG Soap.                9.  Pat yourself dry with a clean towel.            10.  Wear clean pajamas.            11.  Place clean sheets on your bed the night of your first shower and do not  sleep with pets. Day of Surgery : Do not apply any lotions/deodorants the morning of surgery.  Please wear clean clothes to the hospital/surgery center.  FAILURE TO FOLLOW THESE INSTRUCTIONS MAY RESULT IN THE CANCELLATION OF YOUR SURGERY PATIENT SIGNATURE_________________________________  NURSE SIGNATURE__________________________________  ________________________________________________________________________   [name]  An incentive spirometer is a tool that can help keep your lungs clear and active. This tool measures how well you are filling your lungs with each breath. Taking long deep breaths may help reverse or decrease the chance of developing breathing (pulmonary) problems (especially infection) following:  A long period of time when you are unable to move or be active. BEFORE THE PROCEDURE   If the spirometer includes an indicator to show your best effort, your nurse or respiratory therapist will set it to a  desired goal.  If possible, sit up straight or lean slightly forward. Try not to slouch.  Hold the incentive spirometer in an upright position. INSTRUCTIONS FOR USE  1. Sit on the edge of your bed if possible, or sit up as far as you can in bed or on  a chair. 2. Hold the incentive spirometer in an upright position. 3. Breathe out normally. 4. Place the mouthpiece in your mouth and seal your lips tightly around it. 5. Breathe in slowly and as deeply as possible, raising the piston or the ball toward the top of the column. 6. Hold your breath for 3-5 seconds or for as long as possible. Allow the piston or ball to fall to the bottom of the column. 7. Remove the mouthpiece from your mouth and breathe out normally. 8. Rest for a few seconds and repeat Steps 1 through 7 at least 10 times every 1-2 hours when you are awake. Take your time and take a few normal breaths between deep breaths. 9. The spirometer may include an indicator to show your best effort. Use the indicator as a goal to work toward during each repetition. 10. After each set of 10 deep breaths, practice coughing to be sure your lungs are clear. If you have an incision (the cut made at the time of surgery), support your incision when coughing by placing a pillow or rolled up towels firmly against it. Once you are able to get out of bed, walk around indoors and cough well. You may stop using the incentive spirometer when instructed by your caregiver.  RISKS AND COMPLICATIONS  Take your time so you do not get dizzy or light-headed.  If you are in pain, you may need to take or ask for pain medication before doing incentive spirometry. It is harder to take a deep breath if you are having pain. AFTER USE  Rest and breathe slowly and easily.  It can be helpful to keep track of a log of your progress. Your caregiver can provide you with a simple table to help with this. If you are using the spirometer at home, follow these  instructions: [place] IF:   You are having difficultly using the spirometer.  You have trouble using the spirometer as often as instructed.  Your pain medication is not giving enough relief while using the spirometer.  You develop fever of 100.5 F (38.1 C) or higher. SEEK IMMEDIATE MEDICAL CARE IF:   You cough up bloody sputum that had not been present before.  You develop fever of 102 F (38.9 C) or greater.  You develop worsening pain at or near the incision site. MAKE SURE YOU:   Understand these instructions.  Will watch your condition.  Will get help right away if you are not doing well or get worse. Document Released: 08/16/2006 Document Revised: 06/28/2011 Document Reviewed: 10/17/2006 ExitCare Patient Information 2014 ExitCare, [place].   ________________________________________________________________________  WHAT IS A BLOOD TRANSFUSION? Blood Transfusion Information  A transfusion is the replacement of blood or some of its parts. Blood is made up of multiple cells which provide different functions.  Red blood cells carry oxygen and are used for blood loss replacement.  White blood cells fight against infection.  Platelets control bleeding.  Plasma helps clot blood.  Other blood products are available for specialized needs, such as hemophilia or other clotting disorders. BEFORE THE TRANSFUSION  Who gives blood for transfusions?   Healthy volunteers who are fully evaluated to make sure their blood is safe. This is blood bank blood. Transfusion therapy is the safest it has ever been in the practice of medicine. Before blood is taken from a donor, a complete history is taken to make sure that person has no history of diseases nor engages in risky social behavior (examples  are intravenous drug use or sexual activity with multiple partners). The donor's travel history is screened to minimize risk of transmitting infections, such as malaria. The donated  blood is tested for signs of infectious diseases, such as HIV and hepatitis. The blood is then tested to be sure it is compatible with you in order to minimize the chance of a transfusion reaction. If you or a relative donates blood, this is often done in anticipation of surgery and is not appropriate for emergency situations. It takes many days to process the donated blood. RISKS AND COMPLICATIONS Although transfusion therapy is very safe and saves many lives, the main dangers of transfusion include:   Getting an infectious disease.  Developing a transfusion reaction. This is an allergic reaction to something in the blood you were given. Every precaution is taken to prevent this. The decision to have a blood transfusion has been considered carefully by your caregiver before blood is given. Blood is not given unless the benefits outweigh the risks. AFTER THE TRANSFUSION  Right after receiving a blood transfusion, you will usually feel much better and more energetic. This is especially true if your red blood cells have gotten low (anemic). The transfusion raises the level of the red blood cells which carry oxygen, and this usually causes an energy increase.  The nurse administering the transfusion will monitor you carefully for complications. HOME CARE INSTRUCTIONS  No special instructions are needed after a transfusion. You may find your energy is better. Speak with your caregiver about any limitations on activity for underlying diseases you may have. SEEK MEDICAL CARE IF:   Your condition is not improving after your transfusion.  You develop redness or irritation at the intravenous (IV) site. SEEK IMMEDIATE MEDICAL CARE IF:  Any of the following symptoms occur over the next 12 hours:  Shaking chills.  You have a temperature by mouth above 102 F (38.9 C), not controlled by medicine.  Chest, back, or muscle pain.  People around you feel you are not acting correctly or are  confused.  Shortness of breath or difficulty breathing.  Dizziness and fainting.  You get a rash or develop hives.  You have a decrease in urine output.  Your urine turns a dark color or changes to pink, red, or brown. Any of the following symptoms occur over the next 10 days:  You have a temperature by mouth above 102 F (38.9 C), not controlled by medicine.  Shortness of breath.  Weakness after normal activity.  The white part of the eye turns yellow (jaundice).  You have a decrease in the amount of urine or are urinating less often.  Your urine turns a dark color or changes to pink, red, or brown. Document Released: 04/02/2000 Document Revised: 06/28/2011 Document Reviewed: 11/20/2007 [facility] Patient Information 2014 [place], [place].  _______________________________________________________________________

## 2018-09-18 NOTE — Progress Notes (Signed)
06/07/2018-noted in Epic  Clearance note from Cardiology- Dr. Daneen Schick [facility], [place])  01/24/2018- noted in Epic last office note with Dr. Daneen Schick

## 2018-09-19 ENCOUNTER — Other Ambulatory Visit: Payer: Self-pay

## 2018-09-19 ENCOUNTER — Encounter (HOSPITAL_COMMUNITY)
Admission: RE | Admit: 2018-09-19 | Discharge: 2018-09-19 | Disposition: A | Discharge status: Home / Self Care | Payer: BC Managed Care – PPO | Source: Ambulatory Visit | Attending: Orthopedic Surgery | Admitting: Orthopedic Surgery

## 2018-09-19 ENCOUNTER — Encounter (HOSPITAL_COMMUNITY): Payer: Self-pay

## 2018-09-19 DIAGNOSIS — I480 Paroxysmal atrial fibrillation: Secondary | ICD-10-CM | POA: Diagnosis not present

## 2018-09-19 DIAGNOSIS — M1711 Unilateral primary osteoarthritis, right knee: Secondary | ICD-10-CM | POA: Diagnosis not present

## 2018-09-19 DIAGNOSIS — I10 Essential (primary) hypertension: Secondary | ICD-10-CM | POA: Insufficient documentation

## 2018-09-19 DIAGNOSIS — Z01818 Encounter for other preprocedural examination: Secondary | ICD-10-CM | POA: Insufficient documentation

## 2018-09-19 HISTORY — DX: Other retention of urine: R33.8

## 2018-09-19 HISTORY — DX: Elevated prostate specific antigen (PSA): R97.20

## 2018-09-19 HISTORY — DX: Other postprocedural complications and disorders of genitourinary system: N99.89

## 2018-09-19 LAB — COMPREHENSIVE METABOLIC PANEL WITH GFR
ALT: 51 U/L — ABNORMAL HIGH (ref 0–44)
AST: 36 U/L (ref 15–41)
Albumin: 4.5 g/dL (ref 3.5–5.0)
Alkaline Phosphatase: 60 U/L (ref 38–126)
Anion gap: 7 (ref 5–15)
BUN: 12 mg/dL (ref 8–23)
CO2: 26 mmol/L (ref 22–32)
Calcium: 9.2 mg/dL (ref 8.9–10.3)
Chloride: 110 mmol/L (ref 98–111)
Creatinine, Ser: 0.82 mg/dL (ref 0.61–1.24)
GFR calc Af Amer: >60 mL/min (ref >60)
GFR calc non Af Amer: >60 mL/min (ref >60)
Glucose, Bld: 114 mg/dL — ABNORMAL HIGH (ref 70–99)
Potassium: 4.9 mmol/L (ref 3.5–5.1)
Sodium: 143 mmol/L (ref 135–145)
Total Bilirubin: 0.7 mg/dL (ref 0.3–1.2)
Total Protein: 7.9 g/dL (ref 6.5–8.1)

## 2018-09-19 LAB — CBC
HCT: 48.6 % (ref 39.0–52.0)
Hemoglobin: 15.2 g/dL (ref 13.0–17.0)
MCH: 29.7 pg (ref 26.0–34.0)
MCHC: 31.3 g/dL (ref 30.0–36.0)
MCV: 95.1 fL (ref 80.0–100.0)
Platelets: 330 10*3/uL (ref 150–400)
RBC: 5.11 MIL/uL (ref 4.22–5.81)
RDW: 12.7 % (ref 11.5–15.5)
WBC: 9.5 10*3/uL (ref 4.0–10.5)
nRBC: 0 % (ref 0.0–0.2)

## 2018-09-19 LAB — PROTIME-INR
INR: 1.3 — ABNORMAL HIGH (ref 0.8–1.2)
Prothrombin Time: 16.4 s — ABNORMAL HIGH (ref 11.4–15.2)

## 2018-09-19 LAB — APTT: aPTT: 38 seconds — ABNORMAL HIGH (ref 24–36)

## 2018-09-19 LAB — SURGICAL PCR SCREEN
MRSA, PCR: NEGATIVE
Staphylococcus aureus: NEGATIVE

## 2018-09-19 LAB — ABO/RH: ABO/RH(D): O POS

## 2018-09-19 NOTE — Progress Notes (Signed)
Pre-op nurse note:   Pt with hx of paroxysmal afib . Patient has been cleared for surgery by cardio Dr Tamala Julian. Patient was told by his provider to hold his Eliquis 3 days before surgery and vit and suppl. 5 days prior. EKG ordered today shows Afib with v-rate of 120. Patient denies acute cardiac sx. Initial bp check was 143/113. Patient attributes this to nervousness and anxiety about upcoming surgery . bp on recheck 118/84

## 2018-09-21 ENCOUNTER — Telehealth: Payer: Self-pay

## 2018-09-21 ENCOUNTER — Telehealth: Payer: Self-pay | Admitting: Interventional Cardiology

## 2018-09-21 ENCOUNTER — Telehealth: Payer: Self-pay | Admitting: Cardiology

## 2018-09-21 ENCOUNTER — Other Ambulatory Visit (HOSPITAL_COMMUNITY): Admission: RE | Admit: 2018-09-21 | Payer: BC Managed Care – PPO | Source: Ambulatory Visit
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
## 2018-09-21 NOTE — Telephone Encounter (Signed)
I called the patient. He was unaware he was in rapid AF. I will arrange an OV for tomorrow.  Kerin Ransom PA-C 09/21/2018 4:24 PM

## 2018-09-21 NOTE — Telephone Encounter (Signed)
° ° °  Konrad Felix -PA, Anesthesia calling concerning afib and RVR Please call [phone]

## 2018-09-21 NOTE — Telephone Encounter (Signed)
Contacted patient and gave appt date and time. Patient voiced understanding.        COVID-19 Pre-Screening Questions:  . In the past 7 to 10 days have you had a cough,  shortness of breath, headache, congestion, fever (100 or greater) body aches, chills, sore throat, or sudden loss of taste or sense of smell? NO . Have you been around anyone with known Covid 19. NO . Have you been around anyone who is awaiting Covid 19 test results in the past 7 to 10 days?NO . Have you been around anyone who has been exposed to Covid 19, or has mentioned symptoms of Covid 19 within the past 7 to 10 days? NO  If you have any concerns/questions about symptoms patients report during screening (either on the phone or at threshold). Contact the provider seeing the patient or DOD for further guidance.  If neither are available contact a member of the leadership team.
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
## 2018-09-21 NOTE — Telephone Encounter (Signed)
Contacted by Iver Nestle PA with anesthesia about Mr Humbarger who was noted to be in AF with a VR of 120 on a pre op (knee) EKG.  Office note from Oct 2019 indicates he was in NSR.  I will arrange for an office visit tomorrow.  Kerin Ransom PA-C 09/21/2018 4:16 PM

## 2018-09-22 ENCOUNTER — Encounter: Payer: Self-pay | Admitting: Cardiology

## 2018-09-22 ENCOUNTER — Ambulatory Visit (INDEPENDENT_AMBULATORY_CARE_PROVIDER_SITE_OTHER): Payer: BC Managed Care – PPO | Admitting: Cardiology

## 2018-09-22 ENCOUNTER — Telehealth: Payer: Self-pay | Admitting: Radiology

## 2018-09-22 ENCOUNTER — Other Ambulatory Visit: Payer: Self-pay

## 2018-09-22 ENCOUNTER — Other Ambulatory Visit (HOSPITAL_COMMUNITY): Admission: RE | Admit: 2018-09-22 | Payer: BC Managed Care – PPO | Source: Ambulatory Visit

## 2018-09-22 VITALS — BP 130/90 | HR 72 | Ht 69.0 in | Wt 201.8 lb

## 2018-09-22 DIAGNOSIS — I10 Essential (primary) hypertension: Secondary | ICD-10-CM | POA: Diagnosis not present

## 2018-09-22 DIAGNOSIS — Z7901 Long term (current) use of anticoagulants: Secondary | ICD-10-CM

## 2018-09-22 DIAGNOSIS — Z01818 Encounter for other preprocedural examination: Secondary | ICD-10-CM

## 2018-09-22 DIAGNOSIS — I480 Paroxysmal atrial fibrillation: Secondary | ICD-10-CM | POA: Diagnosis not present

## 2018-09-22 DIAGNOSIS — E782 Mixed hyperlipidemia: Secondary | ICD-10-CM

## 2018-09-22 DIAGNOSIS — G4733 Obstructive sleep apnea (adult) (pediatric): Secondary | ICD-10-CM

## 2018-09-22 MED ORDER — METOPROLOL SUCCINATE ER 25 MG PO TB24: 25.0000 mg | ORAL_TABLET | Freq: Every day | ORAL | 1 refills | Status: DC

## 2018-09-22 NOTE — Patient Instructions (Addendum)
Medication Instructions:  Your physician has recommended you make the following change in your medication:  1.  START Toprol XL 25 mg taking 1 daily   If you need a refill on your cardiac medications before your next appointment, please call your pharmacy.   Lab work: None ordered  If you have labs (blood work) drawn today and your tests are completely normal, you will receive your results only by: Marland Kitchen MyChart Message (if you have MyChart) OR . A paper copy in the mail If you have any lab test that is abnormal or we need to change your treatment, we will call you to review the results.  Testing/Procedures: Your physician has recommended that you wear a heart monitor. Heart monitors are medical devices that record the heart's electrical activity. Doctors most often use these monitors to diagnose arrhythmias. Arrhythmias are problems with the speed or rhythm of the heartbeat. The monitor is a small, portable device. You can wear one while you do your normal daily activities. This is usually used to diagnose what is causing palpitations/syncope (passing out).   Follow-Up: At [facility], you and your health needs are our priority.  As part of our continuing mission to provide you with exceptional heart care, we have created designated Provider Care Teams.  These Care Teams include your primary Cardiologist (physician) and Advanced Practice Providers (APPs -  Physician Assistants and Nurse Practitioners) who all work together to provide you with the care you need, when you need it. You will need a follow up appointment in 10/18/2018 8:15.  ARRIVE AT 8:00 FOR REGISTRATION.  THIS IS AN IN OFFICE VISIT   Any Other Special Instructions Will Be Listed Below (If Applicable).

## 2018-09-22 NOTE — Telephone Encounter (Signed)
Enrolled patient for a 14 Day Zio monitor to be mailed. Brief instructions were gone over with patient and he knows to expect the monitor to arrive in 3-4 days

## 2018-09-25 LAB — TYPE AND SCREEN
ABO/RH(D): O POS
Antibody Screen: NEGATIVE

## 2018-09-27 ENCOUNTER — Ambulatory Visit (INDEPENDENT_AMBULATORY_CARE_PROVIDER_SITE_OTHER): Payer: BC Managed Care – PPO

## 2018-09-27 DIAGNOSIS — I480 Paroxysmal atrial fibrillation: Secondary | ICD-10-CM

## 2018-09-29 ENCOUNTER — Ambulatory Visit: Payer: BLUE CROSS/BLUE SHIELD | Admitting: Physical Therapy

## 2018-10-10 ENCOUNTER — Telehealth: Payer: Self-pay | Admitting: Cardiology

## 2018-10-10 NOTE — Telephone Encounter (Signed)
New Message   Patient is calling to confirm if he can remove his cardiac monitor. Please call.

## 2018-10-10 NOTE — Telephone Encounter (Signed)
Returned call to patient he has had his monitor on for 13.5 days and it was ordered for 14. He is gonna go ahead and take off, pack it up and mail back tom. Patient was very satisfied with his monitor experience stating how much nicer monitor was than one he had worn in the past.

## 2018-10-16 DIAGNOSIS — I4891 Unspecified atrial fibrillation: Secondary | ICD-10-CM | POA: Diagnosis not present

## 2018-10-17 ENCOUNTER — Telehealth: Payer: Self-pay | Admitting: Physician Assistant

## 2018-10-17 ENCOUNTER — Other Ambulatory Visit: Payer: Self-pay

## 2018-10-17 ENCOUNTER — Telehealth: Payer: Self-pay

## 2018-10-17 NOTE — Telephone Encounter (Signed)

## 2018-10-17 NOTE — Telephone Encounter (Signed)
Spoke with the patient, he said he never feels his afib. I advised him that Cecilie Kicks, NP would review the entire monitor.
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
## 2018-10-17 NOTE — Telephone Encounter (Signed)
Reviewed monitor with Dr. Lovena Le, he stated that Gratz stated Afib on the monitor and he said it was SVT so he stated he was not certain the afib burden if the monitor is tracking SVT.

## 2018-10-17 NOTE — Telephone Encounter (Signed)
Pt to see Richardson Dopp tomorrow to eval for pre-op

## 2018-10-17 NOTE — Telephone Encounter (Signed)
Spoke with [employer] associate, the patient had afib 44% of the time. His highest rate of afib was 196 for 1 minute. He also had 3 runs of V-Tach, longest at 16 seconds and 203 HR.

## 2018-10-18 ENCOUNTER — Other Ambulatory Visit: Payer: Self-pay

## 2018-10-18 ENCOUNTER — Ambulatory Visit (INDEPENDENT_AMBULATORY_CARE_PROVIDER_SITE_OTHER): Payer: BC Managed Care – PPO | Admitting: Physician Assistant

## 2018-10-18 ENCOUNTER — Encounter: Payer: Self-pay | Admitting: Physician Assistant

## 2018-10-18 VITALS — BP 128/84 | HR 66 | Ht 69.0 in | Wt 203.0 lb

## 2018-10-18 DIAGNOSIS — I471 Supraventricular tachycardia: Secondary | ICD-10-CM

## 2018-10-18 DIAGNOSIS — I10 Essential (primary) hypertension: Secondary | ICD-10-CM

## 2018-10-18 DIAGNOSIS — I480 Paroxysmal atrial fibrillation: Secondary | ICD-10-CM

## 2018-10-18 DIAGNOSIS — G4733 Obstructive sleep apnea (adult) (pediatric): Secondary | ICD-10-CM

## 2018-10-18 DIAGNOSIS — Z01810 Encounter for preprocedural cardiovascular examination: Secondary | ICD-10-CM

## 2018-10-18 MED ORDER — METOPROLOL SUCCINATE ER 50 MG PO TB24: 50.0000 mg | ORAL_TABLET | Freq: Every day | ORAL | 3 refills | Status: DC

## 2018-10-18 NOTE — Patient Instructions (Addendum)
Medication Instructions:   Start taking Toprol XL 50 mg once a day   If you need a refill on your cardiac medications before your next appointment, please call your pharmacy.   Lab work:   If you have labs (blood work) drawn today and your tests are completely normal, you will receive your results only by: Marland Kitchen MyChart Message (if you have MyChart) OR . A paper copy in the mail If you have any lab test that is abnormal or we need to change your treatment, we will call you to review the results.  Testing/Procedures: NONE ORDERED  TODAY    Follow-Up: At [facility], you and your health needs are our priority.  As part of our continuing mission to provide you with exceptional heart care, we have created designated Provider Care Teams.  These Care Teams include your primary Cardiologist (physician) and Advanced Practice Providers (APPs -  Physician Assistants and Nurse Practitioners) who all work together to provide you with the care you need, when you need it. You will need a follow up appointment in 2-3 months.  You may see Dr.Taylor  or one of the following Advanced Practice Providers on your designated Care Team:   Chanetta Marshall, NP . Tommye Standard, PA-C  Any Other Special Instructions Will Be Listed Below (If Applicable).

## 2018-11-03 DIAGNOSIS — R972 Elevated prostate specific antigen [PSA]: Secondary | ICD-10-CM | POA: Diagnosis not present

## 2018-11-03 DIAGNOSIS — N401 Enlarged prostate with lower urinary tract symptoms: Secondary | ICD-10-CM | POA: Diagnosis not present

## 2018-11-03 DIAGNOSIS — N138 Other obstructive and reflux uropathy: Secondary | ICD-10-CM | POA: Diagnosis not present

## 2018-11-03 DIAGNOSIS — Z8042 Family history of malignant neoplasm of prostate: Secondary | ICD-10-CM | POA: Diagnosis not present

## 2018-11-13 ENCOUNTER — Encounter: Payer: Self-pay | Admitting: Gastroenterology
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
## 2018-11-16 ENCOUNTER — Other Ambulatory Visit (HOSPITAL_COMMUNITY)
Admission: RE | Admit: 2018-11-16 | Discharge: 2018-11-16 | Disposition: A | Discharge status: Home / Self Care | Payer: BC Managed Care – PPO | Source: Ambulatory Visit | Attending: Orthopedic Surgery | Admitting: Orthopedic Surgery

## 2018-11-16 DIAGNOSIS — Z20828 Contact with and (suspected) exposure to other viral communicable diseases: Secondary | ICD-10-CM | POA: Insufficient documentation

## 2018-11-16 LAB — SARS CORONAVIRUS 2 (TAT 6-24 HRS): SARS Coronavirus 2: NEGATIVE

## 2018-11-16 NOTE — Patient Instructions (Signed)
YOU NEED TO HAVE A COVID 19 TEST ON__7/30_____ @_______ , THIS TEST MUST BE DONE BEFORE SURGERY, COME  Philip Jackson , [id number]. ONCE YOUR COVID TEST IS COMPLETED, PLEASE BEGIN THE QUARANTINE INSTRUCTIONS AS OUTLINED IN YOUR HANDOUT.                Philip Jackson    Your procedure is scheduled on: Monday 11/20/18   Report to [facility] Main  Entrance  Report to admitting at  8:55 AM   1 VISITOR IS ALLOWED TO WAIT IN WAITING ROOM  ONLY DAY OF YOUR SURGERY.  Not in short stay.   Call this number if you have problems the morning of surgery [place] AND RINSE YOUR MOUTH OUT, NO CHEWING GUM CANDY OR MINTS.  Please bring Mask and tubing to hospital with you.   Take these medicines the morning of surgery with A SIP OF WATER: Toprol-LX ,Cardizem, Detrol                                 You may not have any metal on your body including piercings             Do not wear jewelry, , lotions, powders or deodorant              Men may shave face and neck.   Do not bring valuables to the hospital. [place].  Contacts, dentures or bridgework may not be worn into surgery.        Name and phone number of your driver:  Special Instructions: N/A              Please read over the following fact sheets you were given: _____________________________________________________________________             Do not eat food After Midnight.  YOU MAY HAVE CLEAR LIQUIDS FROM MIDNIGHT UNTIL 8:30AM.  At 8:30AM Please finish the prescribed Pre-Surgery  drink. Nothing by mouth after you finish  the drink !    CLEAR LIQUID DIET   Foods Allowed                                                                     Foods Excluded  Coffee and tea, regular and decaf                             liquids that you cannot  Plain Jell-O any favor except red or purple                                            see through such as: Fruit ices (not with fruit pulp)  milk, soups, orange juice  Iced Popsicles                                    All solid food Carbonated beverages, regular and diet                                    Cranberry, grape and apple juices Sports drinks like Gatorade Lightly seasoned clear broth or consume(fat free) Sugar, honey syrup  _____________________________________________________________________    [facility] - Preparing for Surgery  Before surgery, you can play an important role .  Because skin is not sterile, your skin needs to be as free of germs as possible.   You can reduce the number of germs on your skin by washing with CHG (chlorahexidine gluconate) soap before surgery.   CHG is an antiseptic cleaner which kills germs and bonds with the skin to continue killing germs even after washing. Please DO NOT use if you have an allergy to CHG or antibacterial soaps.   If your skin becomes reddened/irritated stop using the CHG and inform your nurse when you arrive at Short Stay. r.  You may shave your face/neck. Please follow these instructions carefully:  1.  Shower with CHG Soap the night before surgery and the  morning of Surgery.  2.  If you choose to wash your hair, wash your hair first as usual with your  normal  shampoo.  3.  After you shampoo, rinse your hair and body thoroughly to remove the  shampoo.                                        4.  Use CHG as you would any other liquid soap.  You can apply chg directly  to the skin and wash                       Gently with a scrungie or clean washcloth.  5.  Apply the CHG Soap to your body ONLY FROM THE NECK DOWN.   Do not use on face/ open                           Wound or open sores. Avoid contact with eyes, ears mouth and genitals (private parts).                       Wash face,  Genitals (private parts) with your normal soap.             6.  Wash thoroughly,  paying special attention to the area where your surgery  will be performed.  7.  Thoroughly rinse your body with warm water from the neck down.  8.  DO NOT shower/wash with your normal soap after using and rinsing off  the CHG Soap.             9.  Pat yourself dry with a clean towel.            10.  Wear clean pajamas.            11.  Place clean sheets on your bed the night of your first shower and  do not  sleep with pets.  Day of Surgery : Do not apply any lotions/deodorants the morning of surgery.  Please wear clean clothes to the hospital/surgery center.   FAILURE TO FOLLOW THESE INSTRUCTIONS MAY RESULT IN THE CANCELLATION OF YOUR SURGERY PATIENT SIGNATURE_________________________________  NURSE SIGNATURE__________________________________  ________________________________________________________________________   Philip Jackson  An incentive spirometer is a tool that can help keep your lungs clear and active. This tool measures how well you are filling your lungs with each breath. Taking long deep breaths may help reverse or decrease the chance of developing breathing (pulmonary) problems (especially infection) following:  A long period of time when you are unable to move or be active. BEFORE THE PROCEDURE   If the spirometer includes an indicator to show your best effort, your nurse or respiratory therapist will set it to a desired goal.  If possible, sit up straight or lean slightly forward. Try not to slouch.  Hold the incentive spirometer in an upright position. INSTRUCTIONS FOR USE  1. Sit on the edge of your bed if possible, or sit up as far as you can in bed or on a chair. 2. Hold the incentive spirometer in an upright position. 3. Breathe out normally. 4. Place the mouthpiece in your mouth and seal your lips tightly around it. 5. Breathe in slowly and as deeply as possible, raising the piston or the ball toward the top of the column. 6. Hold your breath for 3-5  seconds or for as long as possible. Allow the piston or ball to fall to the bottom of the column. 7. Remove the mouthpiece from your mouth and breathe out normally. 8. Rest for a few seconds and repeat Steps 1 through 7 at least 10 times every 1-2 hours when you are awake. Take your time and take a few normal breaths between deep breaths. 9. The spirometer may include an indicator to show your best effort. Use the indicator as a goal to work toward during each repetition. 10. After each set of 10 deep breaths, practice coughing to be sure your lungs are clear. If you have an incision (the cut made at the time of surgery), support your incision when coughing by placing a pillow or rolled up towels firmly against it. Once you are able to get out of bed, walk around indoors and cough well. You may stop using the incentive spirometer when instructed by your caregiver.  RISKS AND COMPLICATIONS  Take your time so you do not get dizzy or light-headed.  If you are in pain, you may need to take or ask for pain medication before doing incentive spirometry. It is harder to take a deep breath if you are having pain. AFTER USE  Rest and breathe slowly and easily.  It can be helpful to keep track of a log of your progress. Your caregiver can provide you with a simple table to help with this. If you are using the spirometer at home, follow these instructions: [place] IF:   You are having difficultly using the spirometer.  You have trouble using the spirometer as often as instructed.  Your pain medication is not giving enough relief while using the spirometer.  You develop fever of 100.5 F (38.1 C) or higher. SEEK IMMEDIATE MEDICAL CARE IF:   You cough up bloody sputum that had not been present before.  You develop fever of 102 F (38.9 C) or greater.  You develop worsening pain at or near the incision site. MAKE  SURE YOU:   Understand these instructions.  Will watch your  condition.  Will get help right away if you are not doing well or get worse. Document Released: 08/16/2006 Document Revised: 06/28/2011 Document Reviewed: 10/17/2006 [facility] Patient Information 2014 [place], [place].   ________________________________________________________________________

## 2018-11-17 ENCOUNTER — Encounter (HOSPITAL_COMMUNITY)
Admission: RE | Admit: 2018-11-17 | Discharge: 2018-11-17 | Disposition: A | Discharge status: Home / Self Care | Payer: BC Managed Care – PPO | Source: Ambulatory Visit | Attending: Orthopedic Surgery | Admitting: Orthopedic Surgery

## 2018-11-17 ENCOUNTER — Encounter (HOSPITAL_COMMUNITY): Payer: Self-pay

## 2018-11-17 ENCOUNTER — Other Ambulatory Visit: Payer: Self-pay

## 2018-11-17 DIAGNOSIS — Z01812 Encounter for preprocedural laboratory examination: Secondary | ICD-10-CM | POA: Insufficient documentation

## 2018-11-17 DIAGNOSIS — Z7901 Long term (current) use of anticoagulants: Secondary | ICD-10-CM | POA: Insufficient documentation

## 2018-11-17 DIAGNOSIS — G4730 Sleep apnea, unspecified: Secondary | ICD-10-CM | POA: Insufficient documentation

## 2018-11-17 DIAGNOSIS — Z79899 Other long term (current) drug therapy: Secondary | ICD-10-CM | POA: Diagnosis not present

## 2018-11-17 DIAGNOSIS — I10 Essential (primary) hypertension: Secondary | ICD-10-CM | POA: Insufficient documentation

## 2018-11-17 DIAGNOSIS — K219 Gastro-esophageal reflux disease without esophagitis: Secondary | ICD-10-CM | POA: Diagnosis not present

## 2018-11-17 DIAGNOSIS — M1711 Unilateral primary osteoarthritis, right knee: Secondary | ICD-10-CM | POA: Insufficient documentation

## 2018-11-17 DIAGNOSIS — I480 Paroxysmal atrial fibrillation: Secondary | ICD-10-CM | POA: Insufficient documentation

## 2018-11-17 DIAGNOSIS — Z87891 Personal history of nicotine dependence: Secondary | ICD-10-CM | POA: Insufficient documentation

## 2018-11-17 DIAGNOSIS — R972 Elevated prostate specific antigen [PSA]: Secondary | ICD-10-CM | POA: Insufficient documentation

## 2018-11-17 DIAGNOSIS — R339 Retention of urine, unspecified: Secondary | ICD-10-CM | POA: Diagnosis not present

## 2018-11-17 HISTORY — DX: Cardiac arrhythmia, unspecified: I49.9

## 2018-11-17 LAB — COMPREHENSIVE METABOLIC PANEL
ALT: 33 U/L (ref 0–44)
AST: 25 U/L (ref 15–41)
Albumin: 4.1 g/dL (ref 3.5–5.0)
Alkaline Phosphatase: 52 U/L (ref 38–126)
Anion gap: 6 (ref 5–15)
BUN: 11 mg/dL (ref 8–23)
CO2: 26 mmol/L (ref 22–32)
Calcium: 8.8 mg/dL — ABNORMAL LOW (ref 8.9–10.3)
Chloride: 110 mmol/L (ref 98–111)
Creatinine, Ser: 0.83 mg/dL (ref 0.61–1.24)
GFR calc Af Amer: >60 mL/min (ref >60)
GFR calc non Af Amer: >60 mL/min (ref >60)
Glucose, Bld: 89 mg/dL (ref 70–99)
Potassium: 4.6 mmol/L (ref 3.5–5.1)
Sodium: 142 mmol/L (ref 135–145)
Total Bilirubin: 0.6 mg/dL (ref 0.3–1.2)
Total Protein: 7 g/dL (ref 6.5–8.1)

## 2018-11-17 LAB — CBC
HCT: 44.3 % (ref 39.0–52.0)
Hemoglobin: 14.2 g/dL (ref 13.0–17.0)
MCH: 30.5 pg (ref 26.0–34.0)
MCHC: 32.1 g/dL (ref 30.0–36.0)
MCV: 95.3 fL (ref 80.0–100.0)
Platelets: 275 10*3/uL (ref 150–400)
RBC: 4.65 MIL/uL (ref 4.22–5.81)
RDW: 12.4 % (ref 11.5–15.5)
WBC: 7.5 10*3/uL (ref 4.0–10.5)
nRBC: 0 % (ref 0.0–0.2)

## 2018-11-17 LAB — SURGICAL PCR SCREEN
MRSA, PCR: NEGATIVE
Staphylococcus aureus: NEGATIVE

## 2018-11-17 LAB — PROTIME-INR
INR: 1.1 (ref 0.8–1.2)
Prothrombin Time: 13.9 seconds (ref 11.4–15.2)

## 2018-11-17 LAB — APTT: aPTT: 33 seconds (ref 24–36)

## 2018-11-17 NOTE — Progress Notes (Signed)
Eliquis stopped 7/31 per Dr.Smith Supplements stopped 7/29 per Dr. Wynelle Link
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
## 2018-11-17 NOTE — Anesthesia Preprocedure Evaluation (Addendum)
Anesthesia Evaluation  Patient identified by MRN, date of birth, ID band Patient awake    Reviewed: Allergy & Precautions, NPO status , Patient's Chart, lab work & pertinent test results  Airway Mallampati: II  TM Distance: >3 FB Neck ROM: Full    Dental no notable dental hx.    Pulmonary neg pulmonary ROS, former smoker,    Pulmonary exam normal breath sounds clear to auscultation       Cardiovascular hypertension, Pt. on medications and Pt. on home beta blockers Normal cardiovascular exam+ dysrhythmias Atrial Fibrillation  Rhythm:Regular Rate:Normal     Neuro/Psych negative neurological ROS  negative psych ROS   GI/Hepatic Neg liver ROS, GERD  ,  Endo/Other  negative endocrine ROS  Renal/GU negative Renal ROS  negative genitourinary   Musculoskeletal negative musculoskeletal ROS (+)   Abdominal   Peds negative pediatric ROS (+)  Hematology negative hematology ROS (+)   Anesthesia Other Findings   Reproductive/Obstetrics negative OB ROS                            Anesthesia Physical Anesthesia Plan  ASA: III  Anesthesia Plan: Spinal   Post-op Pain Management:  [facility] for Post-op pain   Induction: Intravenous  PONV Risk Score and Plan: 2 and Ondansetron and Dexamethasone  Airway Management Planned: Simple Face Mask  Additional Equipment:   Intra-op Plan:   Post-operative Plan:   Informed Consent: I have reviewed the patients History and Physical, chart, labs and discussed the procedure including the risks, benefits and alternatives for the proposed anesthesia with the patient or authorized representative who has indicated his/her understanding and acceptance.     Dental advisory given  Plan Discussed with: CRNA and Surgeon  Anesthesia Plan Comments: (See PAT note 11/17/2018, Konrad Felix, PA-C)       Anesthesia Quick Evaluation

## 2018-11-19 MED ORDER — BUPIVACAINE LIPOSOME 1.3 % IJ SUSP
20.0000 mL | Freq: Once | INTRAMUSCULAR | Status: DC
  Filled 2018-11-19: qty 20

## 2018-11-20 ENCOUNTER — Inpatient Hospital Stay (HOSPITAL_COMMUNITY): Payer: BC Managed Care – PPO | Admitting: Physician Assistant

## 2018-11-20 ENCOUNTER — Encounter (HOSPITAL_COMMUNITY)
Admission: RE | Disposition: A | Discharge status: Home / Self Care | Payer: Self-pay | Source: Home / Self Care | Attending: Orthopedic Surgery

## 2018-11-20 ENCOUNTER — Inpatient Hospital Stay (HOSPITAL_COMMUNITY): Payer: BC Managed Care – PPO | Admitting: Anesthesiology

## 2018-11-20 ENCOUNTER — Other Ambulatory Visit: Payer: Self-pay

## 2018-11-20 ENCOUNTER — Inpatient Hospital Stay (HOSPITAL_COMMUNITY)
Admission: RE | Admit: 2018-11-20 | Discharge: 2018-11-21 | DRG: 470 | Disposition: A | Discharge status: Home / Self Care | Payer: BC Managed Care – PPO | Attending: Orthopedic Surgery | Admitting: Orthopedic Surgery

## 2018-11-20 ENCOUNTER — Encounter (HOSPITAL_COMMUNITY): Payer: Self-pay | Admitting: *Deleted

## 2018-11-20 DIAGNOSIS — I4891 Unspecified atrial fibrillation: Secondary | ICD-10-CM | POA: Diagnosis not present

## 2018-11-20 DIAGNOSIS — I480 Paroxysmal atrial fibrillation: Secondary | ICD-10-CM | POA: Diagnosis present

## 2018-11-20 DIAGNOSIS — Z8249 Family history of ischemic heart disease and other diseases of the circulatory system: Secondary | ICD-10-CM | POA: Diagnosis not present

## 2018-11-20 DIAGNOSIS — Z7901 Long term (current) use of anticoagulants: Secondary | ICD-10-CM

## 2018-11-20 DIAGNOSIS — K219 Gastro-esophageal reflux disease without esophagitis: Secondary | ICD-10-CM | POA: Diagnosis not present

## 2018-11-20 DIAGNOSIS — E785 Hyperlipidemia, unspecified: Secondary | ICD-10-CM | POA: Diagnosis present

## 2018-11-20 DIAGNOSIS — Z91030 Bee allergy status: Secondary | ICD-10-CM

## 2018-11-20 DIAGNOSIS — Z6830 Body mass index (BMI) 30.0-30.9, adult: Secondary | ICD-10-CM | POA: Diagnosis not present

## 2018-11-20 DIAGNOSIS — G8918 Other acute postprocedural pain: Secondary | ICD-10-CM | POA: Diagnosis not present

## 2018-11-20 DIAGNOSIS — G4733 Obstructive sleep apnea (adult) (pediatric): Secondary | ICD-10-CM | POA: Diagnosis not present

## 2018-11-20 DIAGNOSIS — Z85828 Personal history of other malignant neoplasm of skin: Secondary | ICD-10-CM | POA: Diagnosis not present

## 2018-11-20 DIAGNOSIS — R972 Elevated prostate specific antigen [PSA]: Secondary | ICD-10-CM | POA: Diagnosis present

## 2018-11-20 DIAGNOSIS — M1711 Unilateral primary osteoarthritis, right knee: Principal | ICD-10-CM | POA: Diagnosis present

## 2018-11-20 DIAGNOSIS — Z79899 Other long term (current) drug therapy: Secondary | ICD-10-CM

## 2018-11-20 DIAGNOSIS — I10 Essential (primary) hypertension: Secondary | ICD-10-CM | POA: Diagnosis not present

## 2018-11-20 DIAGNOSIS — M1710 Unilateral primary osteoarthritis, unspecified knee: Secondary | ICD-10-CM | POA: Diagnosis present

## 2018-11-20 DIAGNOSIS — E669 Obesity, unspecified: Secondary | ICD-10-CM | POA: Diagnosis not present

## 2018-11-20 DIAGNOSIS — Z87891 Personal history of nicotine dependence: Secondary | ICD-10-CM | POA: Diagnosis not present

## 2018-11-20 DIAGNOSIS — M25761 Osteophyte, right knee: Secondary | ICD-10-CM | POA: Diagnosis present

## 2018-11-20 DIAGNOSIS — M179 Osteoarthritis of knee, unspecified: Secondary | ICD-10-CM | POA: Diagnosis present

## 2018-11-20 HISTORY — PX: TOTAL KNEE ARTHROPLASTY: SHX125

## 2018-11-20 LAB — TYPE AND SCREEN
ABO/RH(D): O POS
Antibody Screen: NEGATIVE

## 2018-11-20 SURGERY — ARTHROPLASTY, KNEE, TOTAL
Anesthesia: Spinal | Laterality: Right

## 2018-11-20 MED ORDER — BUPIVACAINE LIPOSOME 1.3 % IJ SUSP
20.0000 mL | Freq: Once | INTRAMUSCULAR | Status: DC
  Filled 2018-11-20: qty 20

## 2018-11-20 MED ORDER — DEXAMETHASONE SODIUM PHOSPHATE 10 MG/ML IJ SOLN: 8.0000 mg | Freq: Once | INTRAMUSCULAR | Status: DC

## 2018-11-20 MED ORDER — FENTANYL CITRATE (PF) 100 MCG/2ML IJ SOLN
50.0000 ug | Freq: Once | INTRAMUSCULAR | Status: AC
  Administered 2018-11-20: 11:00:00 50 ug via INTRAVENOUS
  Filled 2018-11-20: qty 2

## 2018-11-20 MED ORDER — PROPOFOL 500 MG/50ML IV EMUL
INTRAVENOUS | Status: DC | PRN
  Administered 2018-11-20: 50 ug/kg/min via INTRAVENOUS
  Administered 2018-11-20: 1.6 ug/kg/min via INTRAVENOUS

## 2018-11-20 MED ORDER — POVIDONE-IODINE 10 % EX SWAB
2.0000 "application " | Freq: Once | CUTANEOUS | Status: AC
  Administered 2018-11-20: 2 via TOPICAL

## 2018-11-20 MED ORDER — CHLORHEXIDINE GLUCONATE 4 % EX LIQD: 60.0000 mL | Freq: Once | CUTANEOUS | Status: DC

## 2018-11-20 MED ORDER — CLONIDINE HCL (ANALGESIA) 100 MCG/ML EP SOLN
EPIDURAL | Status: DC | PRN
  Administered 2018-11-20: 70 ug

## 2018-11-20 MED ORDER — PANTOPRAZOLE SODIUM 40 MG PO TBEC
80.0000 mg | DELAYED_RELEASE_TABLET | Freq: Every day | ORAL | Status: DC
  Administered 2018-11-20 – 2018-11-21 (×2): 80 mg via ORAL
  Filled 2018-11-20 (×2): qty 2

## 2018-11-20 MED ORDER — METOPROLOL SUCCINATE ER 50 MG PO TB24
50.0000 mg | ORAL_TABLET | Freq: Every day | ORAL | Status: DC
  Administered 2018-11-21: 09:00:00 50 mg via ORAL
  Filled 2018-11-20: qty 1

## 2018-11-20 MED ORDER — METOCLOPRAMIDE HCL 5 MG/ML IJ SOLN: 5.0000 mg | Freq: Three times a day (TID) | INTRAMUSCULAR | Status: DC | PRN

## 2018-11-20 MED ORDER — SODIUM CHLORIDE 0.9 % IV SOLN
INTRAVENOUS | Status: DC | PRN
  Administered 2018-11-20: 50 ug/min via INTRAVENOUS

## 2018-11-20 MED ORDER — OXYCODONE HCL 5 MG PO TABS
ORAL_TABLET | ORAL | Status: AC
  Administered 2018-11-20: 15:00:00 5 mg via ORAL
  Filled 2018-11-20: qty 1

## 2018-11-20 MED ORDER — METHOCARBAMOL 500 MG IVPB - SIMPLE MED
500.0000 mg | Freq: Four times a day (QID) | INTRAVENOUS | Status: DC | PRN
  Filled 2018-11-20: qty 50

## 2018-11-20 MED ORDER — CEFAZOLIN SODIUM-DEXTROSE 2-4 GM/100ML-% IV SOLN: 2.0000 g | INTRAVENOUS | Status: DC

## 2018-11-20 MED ORDER — CEFAZOLIN SODIUM-DEXTROSE 2-4 GM/100ML-% IV SOLN
2.0000 g | INTRAVENOUS | Status: AC
  Administered 2018-11-20: 2 g via INTRAVENOUS
  Filled 2018-11-20: qty 100

## 2018-11-20 MED ORDER — SODIUM CHLORIDE 0.9 % IV SOLN
INTRAVENOUS | Status: DC
  Administered 2018-11-20: 17:00:00 via INTRAVENOUS

## 2018-11-20 MED ORDER — LACTATED RINGERS IV SOLN
INTRAVENOUS | Status: DC
  Administered 2018-11-20 (×2): via INTRAVENOUS

## 2018-11-20 MED ORDER — OXYCODONE HCL 5 MG PO TABS
ORAL_TABLET | ORAL | Status: AC
  Filled 2018-11-20: qty 1

## 2018-11-20 MED ORDER — MENTHOL 3 MG MT LOZG: 1.0000 | LOZENGE | OROMUCOSAL | Status: DC | PRN

## 2018-11-20 MED ORDER — APIXABAN 2.5 MG PO TABS
2.5000 mg | ORAL_TABLET | Freq: Two times a day (BID) | ORAL | Status: DC
  Administered 2018-11-21: 2.5 mg via ORAL
  Filled 2018-11-20: qty 1

## 2018-11-20 MED ORDER — SODIUM CHLORIDE 0.9 % IR SOLN
Status: DC | PRN
  Administered 2018-11-20 (×2): 1000 mL

## 2018-11-20 MED ORDER — POLYETHYLENE GLYCOL 3350 17 G PO PACK: 17.0000 g | PACK | Freq: Every day | ORAL | Status: DC | PRN

## 2018-11-20 MED ORDER — PROMETHAZINE HCL 25 MG/ML IJ SOLN: 6.2500 mg | INTRAMUSCULAR | Status: DC | PRN

## 2018-11-20 MED ORDER — ACETAMINOPHEN 500 MG PO TABS
1000.0000 mg | ORAL_TABLET | Freq: Four times a day (QID) | ORAL | Status: AC
  Administered 2018-11-20 – 2018-11-21 (×4): 1000 mg via ORAL
  Filled 2018-11-20 (×4): qty 2

## 2018-11-20 MED ORDER — BUPIVACAINE LIPOSOME 1.3 % IJ SUSP
INTRAMUSCULAR | Status: DC | PRN
  Administered 2018-11-20: 20 mL

## 2018-11-20 MED ORDER — SODIUM CHLORIDE (PF) 0.9 % IJ SOLN
INTRAMUSCULAR | Status: AC
  Filled 2018-11-20: qty 10

## 2018-11-20 MED ORDER — PROPOFOL 10 MG/ML IV BOLUS
INTRAVENOUS | Status: DC | PRN
  Administered 2018-11-20: 20 mg via INTRAVENOUS

## 2018-11-20 MED ORDER — POLYETHYL GLYCOL-PROPYL GLYCOL 0.4-0.3 % OP SOLN: 1.0000 [drp] | Freq: Every day | OPHTHALMIC | Status: DC | PRN

## 2018-11-20 MED ORDER — TRANEXAMIC ACID-NACL 1000-0.7 MG/100ML-% IV SOLN
1000.0000 mg | INTRAVENOUS | Status: AC
  Administered 2018-11-20: 1000 mg via INTRAVENOUS

## 2018-11-20 MED ORDER — POLYVINYL ALCOHOL 1.4 % OP SOLN: 1.0000 [drp] | OPHTHALMIC | Status: DC | PRN

## 2018-11-20 MED ORDER — FLEET ENEMA 7-19 GM/118ML RE ENEM: 1.0000 | ENEMA | Freq: Once | RECTAL | Status: DC | PRN

## 2018-11-20 MED ORDER — DILTIAZEM HCL ER COATED BEADS 180 MG PO CP24
360.0000 mg | ORAL_CAPSULE | Freq: Every day | ORAL | Status: DC
  Administered 2018-11-21: 15:00:00 360 mg via ORAL
  Filled 2018-11-20: qty 2

## 2018-11-20 MED ORDER — SODIUM CHLORIDE (PF) 0.9 % IJ SOLN
INTRAMUSCULAR | Status: AC
  Filled 2018-11-20: qty 50

## 2018-11-20 MED ORDER — ONDANSETRON HCL 4 MG PO TABS: 4.0000 mg | ORAL_TABLET | Freq: Four times a day (QID) | ORAL | Status: DC | PRN

## 2018-11-20 MED ORDER — SODIUM CHLORIDE (PF) 0.9 % IJ SOLN
INTRAMUSCULAR | Status: DC | PRN
  Administered 2018-11-20: 60 mL

## 2018-11-20 MED ORDER — ROPIVACAINE HCL 5 MG/ML IJ SOLN
INTRAMUSCULAR | Status: DC | PRN
  Administered 2018-11-20: 20 mL via PERINEURAL

## 2018-11-20 MED ORDER — DIPHENHYDRAMINE HCL 12.5 MG/5ML PO ELIX: 12.5000 mg | ORAL_SOLUTION | ORAL | Status: DC | PRN

## 2018-11-20 MED ORDER — METOCLOPRAMIDE HCL 5 MG PO TABS: 5.0000 mg | ORAL_TABLET | Freq: Three times a day (TID) | ORAL | Status: DC | PRN

## 2018-11-20 MED ORDER — ACETAMINOPHEN 10 MG/ML IV SOLN
1000.0000 mg | Freq: Four times a day (QID) | INTRAVENOUS | Status: DC
  Administered 2018-11-20: 1000 mg via INTRAVENOUS

## 2018-11-20 MED ORDER — CEFAZOLIN SODIUM-DEXTROSE 2-4 GM/100ML-% IV SOLN
2.0000 g | Freq: Four times a day (QID) | INTRAVENOUS | Status: AC
  Administered 2018-11-20 (×2): 2 g via INTRAVENOUS
  Filled 2018-11-20 (×2): qty 100

## 2018-11-20 MED ORDER — METHOCARBAMOL 500 MG PO TABS
500.0000 mg | ORAL_TABLET | Freq: Four times a day (QID) | ORAL | Status: DC | PRN
  Administered 2018-11-20 – 2018-11-21 (×2): 500 mg via ORAL
  Filled 2018-11-20 (×2): qty 1

## 2018-11-20 MED ORDER — MIDAZOLAM HCL 2 MG/2ML IJ SOLN
1.0000 mg | Freq: Once | INTRAMUSCULAR | Status: AC
  Administered 2018-11-20: 1 mg via INTRAVENOUS
  Filled 2018-11-20: qty 2

## 2018-11-20 MED ORDER — DOCUSATE SODIUM 100 MG PO CAPS
100.0000 mg | ORAL_CAPSULE | Freq: Two times a day (BID) | ORAL | Status: DC
  Administered 2018-11-20 – 2018-11-21 (×2): 100 mg via ORAL
  Filled 2018-11-20 (×2): qty 1

## 2018-11-20 MED ORDER — ROSUVASTATIN CALCIUM 20 MG PO TABS
20.0000 mg | ORAL_TABLET | Freq: Every day | ORAL | Status: DC
  Administered 2018-11-20: 20 mg via ORAL
  Filled 2018-11-20: qty 1

## 2018-11-20 MED ORDER — OXYBUTYNIN CHLORIDE ER 5 MG PO TB24
5.0000 mg | ORAL_TABLET | Freq: Every day | ORAL | Status: DC
  Administered 2018-11-20: 5 mg via ORAL
  Filled 2018-11-20: qty 1

## 2018-11-20 MED ORDER — DEXAMETHASONE SODIUM PHOSPHATE 10 MG/ML IJ SOLN
10.0000 mg | Freq: Once | INTRAMUSCULAR | Status: AC
  Administered 2018-11-21: 10 mg via INTRAVENOUS
  Filled 2018-11-20: qty 1

## 2018-11-20 MED ORDER — TRANEXAMIC ACID-NACL 1000-0.7 MG/100ML-% IV SOLN
1000.0000 mg | INTRAVENOUS | Status: DC
  Filled 2018-11-20: qty 100

## 2018-11-20 MED ORDER — ONDANSETRON HCL 4 MG/2ML IJ SOLN: 4.0000 mg | Freq: Four times a day (QID) | INTRAMUSCULAR | Status: DC | PRN

## 2018-11-20 MED ORDER — TRANEXAMIC ACID-NACL 1000-0.7 MG/100ML-% IV SOLN
1000.0000 mg | Freq: Once | INTRAVENOUS | Status: AC
  Administered 2018-11-20: 1000 mg via INTRAVENOUS
  Filled 2018-11-20: qty 100

## 2018-11-20 MED ORDER — TRAMADOL HCL 50 MG PO TABS: 50.0000 mg | ORAL_TABLET | Freq: Four times a day (QID) | ORAL | Status: DC | PRN

## 2018-11-20 MED ORDER — HYDROMORPHONE HCL 1 MG/ML IJ SOLN: 0.2500 mg | INTRAMUSCULAR | Status: DC | PRN

## 2018-11-20 MED ORDER — BUPIVACAINE IN DEXTROSE 0.75-8.25 % IT SOLN
INTRATHECAL | Status: DC | PRN
  Administered 2018-11-20: 1.6 mL via INTRATHECAL

## 2018-11-20 MED ORDER — LOSARTAN POTASSIUM 50 MG PO TABS
50.0000 mg | ORAL_TABLET | Freq: Every evening | ORAL | Status: DC
  Filled 2018-11-20: qty 1

## 2018-11-20 MED ORDER — PHENOL 1.4 % MT LIQD: 1.0000 | OROMUCOSAL | Status: DC | PRN

## 2018-11-20 MED ORDER — BISACODYL 10 MG RE SUPP: 10.0000 mg | Freq: Every day | RECTAL | Status: DC | PRN

## 2018-11-20 MED ORDER — LACTATED RINGERS IV SOLN: INTRAVENOUS | Status: DC

## 2018-11-20 MED ORDER — ACETAMINOPHEN 10 MG/ML IV SOLN
1000.0000 mg | Freq: Four times a day (QID) | INTRAVENOUS | Status: DC
  Filled 2018-11-20: qty 100

## 2018-11-20 MED ORDER — DEXAMETHASONE SODIUM PHOSPHATE 10 MG/ML IJ SOLN
8.0000 mg | Freq: Once | INTRAMUSCULAR | Status: AC
  Administered 2018-11-20: 8 mg via INTRAVENOUS

## 2018-11-20 MED ORDER — MORPHINE SULFATE (PF) 2 MG/ML IV SOLN: 0.5000 mg | INTRAVENOUS | Status: DC | PRN

## 2018-11-20 MED ORDER — EPHEDRINE SULFATE-NACL 50-0.9 MG/10ML-% IV SOSY
PREFILLED_SYRINGE | INTRAVENOUS | Status: DC | PRN
  Administered 2018-11-20: 10 mg via INTRAVENOUS
  Administered 2018-11-20: 15 mg via INTRAVENOUS

## 2018-11-20 MED ORDER — ONDANSETRON HCL 4 MG/2ML IJ SOLN
INTRAMUSCULAR | Status: DC | PRN
  Administered 2018-11-20: 4 mg via INTRAVENOUS

## 2018-11-20 MED ORDER — OXYCODONE HCL 5 MG PO TABS
5.0000 mg | ORAL_TABLET | ORAL | Status: DC | PRN
  Administered 2018-11-20: 15:00:00 5 mg via ORAL
  Administered 2018-11-20: 10 mg via ORAL
  Administered 2018-11-20: 15:00:00 5 mg via ORAL
  Administered 2018-11-21 (×2): 10 mg via ORAL
  Filled 2018-11-20 (×3): qty 2

## 2018-11-20 MED ORDER — PHENYLEPHRINE 40 MCG/ML (10ML) SYRINGE FOR IV PUSH (FOR BLOOD PRESSURE SUPPORT)
PREFILLED_SYRINGE | INTRAVENOUS | Status: DC | PRN
  Administered 2018-11-20: 80 ug via INTRAVENOUS

## 2018-11-20 MED ORDER — GABAPENTIN 300 MG PO CAPS
300.0000 mg | ORAL_CAPSULE | Freq: Three times a day (TID) | ORAL | Status: DC
  Administered 2018-11-20 – 2018-11-21 (×2): 300 mg via ORAL
  Filled 2018-11-20 (×2): qty 1

## 2018-11-20 SURGICAL SUPPLY — 57 items
ATTUNE MED DOME PAT 38 KNEE (Knees) ×1 IMPLANT
ATTUNE PS FEM RT SZ 7 CEM KNEE (Femur) ×1 IMPLANT
BAG ZIPLOCK 12X15 (MISCELLANEOUS) ×2 IMPLANT
BASE TIBIAL ROT PLAT SZ 7 KNEE (Knees) IMPLANT
BLADE SAG 18X100X1.27 (BLADE) ×2 IMPLANT
BLADE SAW SGTL 11.0X1.19X90.0M (BLADE) ×2 IMPLANT
BLADE SURG SZ10 CARB STEEL (BLADE) ×4 IMPLANT
BNDG ELASTIC 6X5.8 VLCR STR LF (GAUZE/BANDAGES/DRESSINGS) ×2 IMPLANT
BOWL SMART MIX CTS (DISPOSABLE) ×2 IMPLANT
CEMENT HV SMART SET (Cement) ×4 IMPLANT
COVER SURGICAL LIGHT HANDLE (MISCELLANEOUS) ×2 IMPLANT
COVER WAND RF STERILE (DRAPES) IMPLANT
CUFF TOURN SGL QUICK 34 (TOURNIQUET CUFF) ×1
CUFF TRNQT CYL 34X4.125X (TOURNIQUET CUFF) ×1 IMPLANT
DECANTER SPIKE VIAL GLASS SM (MISCELLANEOUS) ×3 IMPLANT
DRAPE U-SHAPE 47X51 STRL (DRAPES) ×2 IMPLANT
DRSG ADAPTIC 3X8 NADH LF (GAUZE/BANDAGES/DRESSINGS) ×2 IMPLANT
DRSG PAD ABDOMINAL 8X10 ST (GAUZE/BANDAGES/DRESSINGS) ×2 IMPLANT
DURAPREP 26ML APPLICATOR (WOUND CARE) ×2 IMPLANT
ELECT REM PT RETURN 15FT ADLT (MISCELLANEOUS) ×2 IMPLANT
EVACUATOR 1/8 PVC DRAIN (DRAIN) ×2 IMPLANT
GAUZE SPONGE 4X4 12PLY STRL (GAUZE/BANDAGES/DRESSINGS) ×2 IMPLANT
GLOVE BIO SURGEON STRL SZ7 (GLOVE) ×2 IMPLANT
GLOVE BIO SURGEON STRL SZ8 (GLOVE) ×2 IMPLANT
GLOVE BIOGEL PI IND STRL 6.5 (GLOVE) ×1 IMPLANT
GLOVE BIOGEL PI IND STRL 7.0 (GLOVE) ×1 IMPLANT
GLOVE BIOGEL PI IND STRL 8 (GLOVE) ×1 IMPLANT
GLOVE BIOGEL PI INDICATOR 6.5 (GLOVE) ×1
GLOVE BIOGEL PI INDICATOR 7.0 (GLOVE) ×1
GLOVE BIOGEL PI INDICATOR 8 (GLOVE) ×1
GLOVE SURG SS PI 6.5 STRL IVOR (GLOVE) ×2 IMPLANT
GOWN STRL REUS W/TWL LRG LVL3 (GOWN DISPOSABLE) ×6 IMPLANT
HANDPIECE INTERPULSE COAX TIP (DISPOSABLE) ×1
HOLDER FOLEY CATH W/STRAP (MISCELLANEOUS) ×1 IMPLANT
IMMOBILIZER KNEE 20 (SOFTGOODS) ×2
IMMOBILIZER KNEE 20 THIGH 36 (SOFTGOODS) ×1 IMPLANT
INSERT TIB ATTUNE RP SZ7X16 (Insert) ×1 IMPLANT
KIT TURNOVER KIT A (KITS) IMPLANT
MANIFOLD NEPTUNE II (INSTRUMENTS) ×2 IMPLANT
NS IRRIG 1000ML POUR BTL (IV SOLUTION) ×2 IMPLANT
PACK TOTAL KNEE CUSTOM (KITS) ×2 IMPLANT
PADDING CAST COTTON 6X4 STRL (CAST SUPPLIES) ×5 IMPLANT
PIN STEINMAN FIXATION KNEE (PIN) ×1 IMPLANT
PIN THREADED HEADED SIGMA (PIN) ×1 IMPLANT
PROTECTOR NERVE ULNAR (MISCELLANEOUS) ×2 IMPLANT
SET HNDPC FAN SPRY TIP SCT (DISPOSABLE) ×1 IMPLANT
STRIP CLOSURE SKIN 1/2X4 (GAUZE/BANDAGES/DRESSINGS) ×4 IMPLANT
SUT MNCRL AB 4-0 PS2 18 (SUTURE) ×2 IMPLANT
SUT STRATAFIX 0 PDS 27 VIOLET (SUTURE) ×2
SUT VIC AB 2-0 CT1 27 (SUTURE) ×3
SUT VIC AB 2-0 CT1 TAPERPNT 27 (SUTURE) ×3 IMPLANT
SUTURE STRATFX 0 PDS 27 VIOLET (SUTURE) ×1 IMPLANT
TIBIAL BASE ROT PLAT SZ 7 KNEE (Knees) ×2 IMPLANT
TRAY FOLEY MTR SLVR 16FR STAT (SET/KITS/TRAYS/PACK) ×2 IMPLANT
WATER STERILE IRR 1000ML POUR (IV SOLUTION) ×4 IMPLANT
WRAP KNEE MAXI GEL POST OP (GAUZE/BANDAGES/DRESSINGS) ×2 IMPLANT
YANKAUER SUCT BULB TIP 10FT TU (MISCELLANEOUS) ×2 IMPLANT

## 2018-11-20 NOTE — Interval H&P Note (Signed)
History and Physical Interval Note:  11/20/2018 9:30 AM  Philip Jackson  has presented today for surgery, with the diagnosis of right knee osteoarthritis.  The various methods of treatment have been discussed with the patient and family. After consideration of risks, benefits and other options for treatment, the patient has consented to  Procedure(s) with comments: TOTAL KNEE ARTHROPLASTY (Right) - 23min as a surgical intervention.  The patient's history has been reviewed, patient examined, no change in status, stable for surgery.  I have reviewed the patient's chart and labs.  Questions were answered to the patient's satisfaction.     [name]

## 2018-11-20 NOTE — Discharge Instructions (Signed)
° °Dr. Frank Aluisio °Total Joint Specialist °[facility] °[address] °[place], [place] [address] °[phone] ° °TOTAL KNEE REPLACEMENT POSTOPERATIVE DIRECTIONS ° °Knee Rehabilitation, Guidelines Following Surgery  °Results after knee surgery are often greatly improved when you follow the exercise, range of motion and muscle strengthening exercises prescribed by your doctor. Safety measures are also important to protect the knee from further injury. Any time any of these exercises cause you to have increased pain or swelling in your knee joint, decrease the amount until you are comfortable again and slowly increase them. If you have problems or questions, call your caregiver or physical therapist for advice.  ° °HOME CARE INSTRUCTIONS  °• Remove items at home which could result in a fall. This includes throw rugs or furniture in walking pathways.  °· ICE to the affected knee every three hours for 30 minutes at a time and then as needed for pain and swelling.  Continue to use ice on the knee for pain and swelling from surgery. You may notice swelling that will progress down to the foot and ankle.  This is normal after surgery.  Elevate the leg when you are not up walking on it.   °· Continue to use the breathing machine which will help keep your temperature down.  It is common for your temperature to cycle up and down following surgery, especially at night when you are not up moving around and exerting yourself.  The breathing machine keeps your lungs expanded and your temperature down. °· Do not place pillow under knee, focus on keeping the knee straight while resting ° °DIET °You may resume your previous home diet once your are discharged from the hospital. ° °DRESSING / WOUND CARE / SHOWERING °You may change your dressing 3-5 days after surgery.  Then change the dressing every day with sterile gauze.  Please use good hand washing techniques before changing the dressing.  Do not use any lotions  or creams on the incision until instructed by your surgeon. °You may start showering once you are discharged home but do not submerge the incision under water. Just pat the incision dry and apply a dry gauze dressing on daily. °Change the surgical dressing daily and reapply a dry dressing each time. ° °ACTIVITY °Walk with your walker as instructed. °Use walker as long as suggested by your caregivers. °Avoid periods of inactivity such as sitting longer than an hour when not asleep. This helps prevent blood clots.  °You may resume a sexual relationship in one month or when given the OK by your doctor.  °You may return to work once you are cleared by your doctor.  °Do not drive a car for 6 weeks or until released by you surgeon.  °Do not drive while taking narcotics. ° °WEIGHT BEARING °Weight bearing as tolerated with assist device (walker, cane, etc) as directed, use it as long as suggested by your surgeon or therapist, typically at least 4-6 weeks. ° °POSTOPERATIVE CONSTIPATION PROTOCOL °Constipation - defined medically as fewer than three stools per week and severe constipation as less than one stool per week. ° °One of the most common issues patients have following surgery is constipation.  Even if you have a regular bowel pattern at home, your normal regimen is likely to be disrupted due to multiple reasons following surgery.  Combination of anesthesia, postoperative narcotics, change in appetite and fluid intake all can affect your bowels.  In order to avoid complications following surgery, here are some   recommendations in order to help you during your recovery period. ° °Colace (docusate) - Pick up an over-the-counter form of Colace or another stool softener and take twice a day as long as you are requiring postoperative pain medications.  Take with a full glass of water daily.  If you experience loose stools or diarrhea, hold the colace until you stool forms back up.  If your symptoms do not get better within 1  week or if they get worse, check with your doctor. ° °Dulcolax (bisacodyl) - Pick up over-the-counter and take as directed by the product packaging as needed to assist with the movement of your bowels.  Take with a full glass of water.  Use this product as needed if not relieved by Colace only.  ° °MiraLax (polyethylene glycol) - Pick up over-the-counter to have on hand.  MiraLax is a solution that will increase the amount of water in your bowels to assist with bowel movements.  Take as directed and can mix with a glass of water, juice, soda, coffee, or tea.  Take if you go more than two days without a movement. °Do not use MiraLax more than once per day. Call your doctor if you are still constipated or irregular after using this medication for 7 days in a row. ° °If you continue to have problems with postoperative constipation, please contact the office for further assistance and recommendations.  If you experience "the worst abdominal pain ever" or develop nausea or vomiting, please contact the office immediatly for further recommendations for treatment. ° °ITCHING °If you experience itching with your medications, try taking only a single pain pill, or even half a pain pill at a time.  You can also use Benadryl over the counter for itching or also to help with sleep.  ° °TED HOSE STOCKINGS °Wear the elastic stockings on both legs for three weeks following surgery during the day but you may remove then at night for sleeping. ° °MEDICATIONS °See your medication summary on the “After Visit Summary” that the nursing staff will review with you prior to discharge.  You may have some home medications which will be placed on hold until you complete the course of blood thinner medication.  It is important for you to complete the blood thinner medication as prescribed by your surgeon.  Continue your approved medications as instructed at time of discharge. ° °PRECAUTIONS °If you experience chest pain or shortness of breath -  call 911 immediately for transfer to the hospital emergency department.  °If you develop a fever greater that 101 F, purulent drainage from wound, increased redness or drainage from wound, foul odor from the wound/dressing, or calf pain - CONTACT YOUR SURGEON.   °                                                °FOLLOW-UP APPOINTMENTS °Make sure you keep all of your appointments after your operation with your surgeon and caregivers. You should call the office at the above phone number and make an appointment for approximately two weeks after the date of your surgery or on the date instructed by your surgeon outlined in the "After Visit Summary". ° °RANGE OF MOTION AND STRENGTHENING EXERCISES  °Rehabilitation of the knee is important following a knee injury or an operation. After just a few days of immobilization, the muscles of   the thigh which control the knee become weakened and shrink (atrophy). Knee exercises are designed to build up the tone and strength of the thigh muscles and to improve knee motion. Often times heat used for twenty to thirty minutes before working out will loosen up your tissues and help with improving the range of motion but do not use heat for the first two weeks following surgery. These exercises can be done on a training (exercise) mat, on the floor, on a table or on a bed. Use what ever works the best and is most comfortable for you Knee exercises include:  °• Leg Lifts - While your knee is still immobilized in a splint or cast, you can do straight leg raises. Lift the leg to 60 degrees, hold for 3 sec, and slowly lower the leg. Repeat 10-20 times 2-3 times daily. Perform this exercise against resistance later as your knee gets better.  °• Quad and Hamstring Sets - Tighten up the muscle on the front of the thigh (Quad) and hold for 5-10 sec. Repeat this 10-20 times hourly. Hamstring sets are done by pushing the foot backward against an object and holding for 5-10 sec. Repeat as with quad  sets.  °· Leg Slides: Lying on your back, slowly slide your foot toward your buttocks, bending your knee up off the floor (only go as far as is comfortable). Then slowly slide your foot back down until your leg is flat on the floor again. °· Angel Wings: Lying on your back spread your legs to the side as far apart as you can without causing discomfort.  °A rehabilitation program following serious knee injuries can speed recovery and prevent re-injury in the future due to weakened muscles. Contact your doctor or a physical therapist for more information on knee rehabilitation.  ° °IF YOU ARE TRANSFERRED TO A SKILLED REHAB FACILITY °If the patient is transferred to a skilled rehab facility following release from the hospital, a list of the current medications will be sent to the facility for the patient to continue.  When discharged from the skilled rehab facility, please have the facility set up the patient's Home Health Physical Therapy prior to being released. Also, the skilled facility will be responsible for providing the patient with their medications at time of release from the facility to include their pain medication, the muscle relaxants, and their blood thinner medication. If the patient is still at the rehab facility at time of the two week follow up appointment, the skilled rehab facility will also need to assist the patient in arranging follow up appointment in our office and any transportation needs. ° °MAKE SURE YOU:  °• Understand these instructions.  °• Get help right away if you are not doing well or get worse.  ° ° °Pick up stool softner and laxative for home use following surgery while on pain medications. °Do not submerge incision under water. °Please use good hand washing techniques while changing dressing each day. °May shower starting three days after surgery. °Please use a clean towel to pat the incision dry following showers. °Continue to use ice for pain and swelling after surgery. °Do not  use any lotions or creams on the incision until instructed by your surgeon. ° °

## 2018-11-20 NOTE — Anesthesia Procedure Notes (Signed)
Anesthesia Regional Block: Adductor canal block   Pre-Anesthetic Checklist: ,, timeout performed, Correct Patient, Correct Site, Correct Laterality, Correct Procedure, Correct Position, site marked, Risks and benefits discussed,  Surgical consent,  Pre-op evaluation,  At surgeon's request and post-op pain management  Laterality: Right  Prep: chloraprep       Needles:  Injection technique: Single-shot  Needle Type: Echogenic Needle     Needle Length: 9cm      Additional Needles:   Procedures:,,,, ultrasound used (permanent image in chart),,,,  Narrative:  Start time: 11/20/2018 10:57 AM End time: 11/20/2018 11:06 AM Injection made incrementally with aspirations every 5 mL.  Performed by: Personally  Anesthesiologist: Myrtie Soman, MD  Additional Notes: Patient tolerated the procedure well without complications

## 2018-11-20 NOTE — Transfer of Care (Addendum)
Immediate Anesthesia Transfer of Care Note  Patient: Philip Jackson  Procedure(s) Performed: TOTAL KNEE ARTHROPLASTY (Right )  Patient Location: PACU  Anesthesia Type:Spinal  Level of Consciousness: awake, alert  and oriented  Airway & Oxygen Therapy: Patient Spontanous Breathing and Patient connected to face mask oxygen  Post-op Assessment: Report given to RN and Post -op Vital signs reviewed and stable  Post vital signs: Reviewed and stable  Last Vitals:  Vitals Value Taken Time  BP    Temp    Pulse    Resp    SpO2      Last Pain:  Vitals:   11/20/18 0915  TempSrc: Oral         Complications: No apparent anesthesia complications

## 2018-11-20 NOTE — Op Note (Signed)
OPERATIVE REPORT-TOTAL KNEE ARTHROPLASTY   Pre-operative diagnosis- Osteoarthritis  Right knee(s)  Post-operative diagnosis- Osteoarthritis Right knee(s)  Procedure-  Right  Total Knee Arthroplasty  Surgeon- Philip Jackson. Marinda Tyer, MD  Assistant- Ardeen Jourdain, PA-C   Anesthesia-  Adductor canal block and spinal  EBL-50 mL   Drains Hemovac  Tourniquet time-  Total Tourniquet Time Documented: Thigh (Right) - 37 minutes Total: Thigh (Right) - 37 minutes     Complications- None  Condition-PACU - hemodynamically stable.   Brief Clinical Note  Philip Jackson is a 68 y.o. year old male with end stage OA of his right knee with progressively worsening pain and dysfunction. He has constant pain, with activity and at rest and significant functional deficits with difficulties even with ADLs. He has had extensive non-op management including analgesics, injections of cortisone and viscosupplements, and home exercise program, but remains in significant pain with significant dysfunction. Radiographs show bone on bone arthritis medial and patellofemoral with post-traumatic deformity of anterior tibial slope proximally. He presents now for right Total Knee Arthroplasty.    Procedure in detail---   The patient is brought into the operating room and positioned supine on the operating table. After successful administration of  Adductor canal block and spinal,   a tourniquet is placed high on the  Right thigh(s) and the lower extremity is prepped and draped in the usual sterile fashion. Time out is performed by the operating team and then the  Right lower extremity is wrapped in Esmarch, knee flexed and the tourniquet inflated to 300 mmHg.       A midline incision is made with a ten blade through the subcutaneous tissue to the level of the extensor mechanism. A fresh blade is used to make a medial parapatellar arthrotomy. Soft tissue over the proximal medial tibia is subperiosteally elevated to the joint  line with a knife and into the semimembranosus bursa with a Cobb elevator. Soft tissue over the proximal lateral tibia is elevated with attention being paid to avoiding the patellar tendon on the tibial tubercle. The patella is everted, knee flexed 90 degrees and the ACL and PCL are removed. Findings are bone on bone medial and patellofemoral with large global osteophytes.        The drill is used to create a starting hole in the distal femur and the canal is thoroughly irrigated with sterile saline to remove the fatty contents. The 5 degree Right  valgus alignment guide is placed into the femoral canal and the distal femoral cutting block is pinned to remove 10 mm off the distal femur. Resection is made with an oscillating saw.      The tibia is subluxed forward and the menisci are removed. The extramedullary alignment guide is placed referencing proximally at the medial aspect of the tibial tubercle and distally along the second metatarsal axis and tibial crest. The block is pinned to remove 63mm off the more deficient medial  Side. The tibia had anterior slope from the previous fracture thus when cutting the proximal tibia and restoring posterior slope, more tibia than usual was resected which necessitated a larger polyethylene insert.  Resection is made with an oscillating saw. Size 7is the most appropriate size for the tibia and the proximal tibia is prepared with the modular drill and keel punch for that size.      The femoral sizing guide is placed and size 7 is most appropriate. Rotation is marked off the epicondylar axis and confirmed by creating a rectangular flexion  gap at 90 degrees. The size 7 cutting block is pinned in this rotation and the anterior, posterior and chamfer cuts are made with the oscillating saw. The intercondylar block is then placed and that cut is made.      Trial size 7 tibial component, trial size 7 posterior stabilized femur and a 16  mm posterior stabilized rotating platform  insert trial is placed. Full extension is achieved with excellent varus/valgus and anterior/posterior balance throughout full range of motion. The patella is everted and thickness measured to be 24  mm. Free hand resection is taken to 14 mm, a 38 template is placed, lug holes are drilled, trial patella is placed, and it tracks normally. Osteophytes are removed off the posterior femur with the trial in place. All trials are removed and the cut bone surfaces prepared with pulsatile lavage. Cement is mixed and once ready for implantation, the size 7 tibial implant, size  7 posterior stabilized femoral component, and the size 38 patella are cemented in place and the patella is held with the clamp. The trial insert is placed and the knee held in full extension. The Exparel (20 ml mixed with 60 ml saline) is injected into the extensor mechanism, posterior capsule, medial and lateral gutters and subcutaneous tissues.  All extruded cement is removed and once the cement is hard the permanent 16 mm posterior stabilized rotating platform insert is placed into the tibial tray.      The wound is copiously irrigated with saline solution and the extensor mechanism closed over a hemovac drain with #1 V-loc suture. The tourniquet is released for a total tourniquet time of 35  minutes. Flexion against gravity is 140 degrees and the patella tracks normally. Subcutaneous tissue is closed with 2.0 vicryl and subcuticular with running 4.0 Monocryl. The incision is cleaned and dried and steri-strips and a bulky sterile dressing are applied. The limb is placed into a knee immobilizer and the patient is awakened and transported to recovery in stable condition.      Please note that a surgical assistant was a medical necessity for this procedure in order to perform it in a safe and expeditious manner. Surgical assistant was necessary to retract the ligaments and vital neurovascular structures to prevent injury to them and also necessary  for proper positioning of the limb to allow for anatomic placement of the prosthesis.   Philip Jackson Philip Aber, MD    11/20/2018, 12:39 PM

## 2018-11-20 NOTE — Anesthesia Procedure Notes (Signed)
Anesthesia Procedure Image    

## 2018-11-20 NOTE — Anesthesia Postprocedure Evaluation (Signed)
Anesthesia Post Note  Patient: Philip Jackson  Procedure(s) Performed: TOTAL KNEE ARTHROPLASTY (Right )     Patient location during evaluation: PACU Anesthesia Type: Spinal Level of consciousness: oriented and awake and alert Pain management: pain level controlled Vital Signs Assessment: post-procedure vital signs reviewed and stable Respiratory status: spontaneous breathing, respiratory function stable and patient connected to nasal cannula oxygen Cardiovascular status: blood pressure returned to baseline and stable Postop Assessment: no headache, no backache and no apparent nausea or vomiting Anesthetic complications: no    Last Vitals:  Vitals:   11/20/18 1500 11/20/18 1515  BP: 96/68 98/66  Pulse: (!) 57 (!) 59  Resp: 12 13  Temp:    SpO2: 100% 98%    Last Pain:  Vitals:   11/20/18 1330  TempSrc:   PainSc: 0-No pain                 Amaris Delafuente S

## 2018-11-20 NOTE — Anesthesia Procedure Notes (Signed)
Spinal  Patient location during procedure: OR Start time: 11/20/2018 11:39 AM Staffing Anesthesiologist: Rose, George, MD Performed: anesthesiologist  Preanesthetic Checklist Completed: patient identified, site marked, surgical consent, pre-op evaluation, timeout performed, IV checked, risks and benefits discussed and monitors and equipment checked Spinal Block Patient position: sitting Prep: Betadine Patient monitoring: heart rate, continuous pulse ox and blood pressure Location: L3-4 Injection technique: single-shot Needle Needle type: Pencan  Needle gauge: 24 G Needle length: 9 cm Assessment Sensory level: T6 Additional Notes Expiration date of kit checked and confirmed. Patient tolerated procedure well, without complications. Attempt x 1 by K walker CRNA; attempt x 1 by G. Rose MD successful       

## 2018-11-20 NOTE — Progress Notes (Signed)
Assisted Dr. Rose with right, ultrasound guided, adductor canal block. Side rails up, monitors on throughout procedure. See vital signs in flow sheet. Tolerated Procedure well.  

## 2018-11-20 NOTE — Anesthesia Procedure Notes (Addendum)
Procedure Name: MAC Date/Time: 11/20/2018 11:32 AM Performed by: West Pugh, CRNA Pre-anesthesia Checklist: Patient identified, Emergency Drugs available, Suction available, Patient being monitored and Timeout performed Patient Re-evaluated:Patient Re-evaluated prior to induction Oxygen Delivery Method: Simple face mask Preoxygenation: Pre-oxygenation with 100% oxygen Induction Type: IV induction Placement Confirmation: positive ETCO2 Dental Injury: Teeth and Oropharynx as per pre-operative assessment
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
## 2018-11-21 ENCOUNTER — Encounter (HOSPITAL_COMMUNITY): Payer: Self-pay | Admitting: Orthopedic Surgery

## 2018-11-21 LAB — BASIC METABOLIC PANEL
Anion gap: 9 (ref 5–15)
BUN: 15 mg/dL (ref 8–23)
CO2: 24 mmol/L (ref 22–32)
Calcium: 8.3 mg/dL — ABNORMAL LOW (ref 8.9–10.3)
Chloride: 107 mmol/L (ref 98–111)
Creatinine, Ser: 0.86 mg/dL (ref 0.61–1.24)
GFR calc Af Amer: >60 mL/min (ref >60)
GFR calc non Af Amer: >60 mL/min (ref >60)
Glucose, Bld: 149 mg/dL — ABNORMAL HIGH (ref 70–99)
Potassium: 4.5 mmol/L (ref 3.5–5.1)
Sodium: 140 mmol/L (ref 135–145)

## 2018-11-21 LAB — CBC
HCT: 41.1 % (ref 39.0–52.0)
Hemoglobin: 12.9 g/dL — ABNORMAL LOW (ref 13.0–17.0)
MCH: 29.9 pg (ref 26.0–34.0)
MCHC: 31.4 g/dL (ref 30.0–36.0)
MCV: 95.4 fL (ref 80.0–100.0)
Platelets: 256 10*3/uL (ref 150–400)
RBC: 4.31 MIL/uL (ref 4.22–5.81)
RDW: 12.5 % (ref 11.5–15.5)
WBC: 16.8 10*3/uL — ABNORMAL HIGH (ref 4.0–10.5)
nRBC: 0 % (ref 0.0–0.2)

## 2018-11-21 MED ORDER — TRAMADOL HCL 50 MG PO TABS: 50.0000 mg | ORAL_TABLET | Freq: Four times a day (QID) | ORAL | 0 refills | Status: DC | PRN

## 2018-11-21 MED ORDER — OXYCODONE HCL 5 MG PO TABS: 5.0000 mg | ORAL_TABLET | Freq: Four times a day (QID) | ORAL | 0 refills | Status: DC | PRN

## 2018-11-21 MED ORDER — METHOCARBAMOL 500 MG PO TABS: 500.0000 mg | ORAL_TABLET | Freq: Four times a day (QID) | ORAL | 0 refills | Status: DC | PRN

## 2018-11-21 MED ORDER — GABAPENTIN 300 MG PO CAPS: 300.0000 mg | ORAL_CAPSULE | Freq: Three times a day (TID) | ORAL | 0 refills | Status: DC
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
## 2018-11-21 NOTE — TOC Progression Note (Signed)
Transition of Care [facility]) - Progression Note    Patient Details  Name: Philip Jackson MRN: [id number] Date of Birth: 11-11-1950  Transition of Care [facility]) CM/SW [place] Phone Number: 11/21/2018, 3:25 PM  Clinical Narrative:    Therapy Plan Outpatient Therapy at [facility]) RW ordered through [facility] and delivered to the patient room     Barriers to Discharge: No Barriers Identified  Expected Discharge Plan and Services           Expected Discharge Date: 11/21/18               DME Arranged: Gilford Rile rolling DME Agency: [employer] Date DME Agency Contacted: 11/21/18 Time DME Agency Contacted: [phone] Representative spoke with at DME Agency: [place] Interventions    Readmission Risk Interventions No flowsheet data found.

## 2018-11-21 NOTE — Plan of Care (Signed)
Plan of care reviewed and discussed with the patient. 
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
## 2018-11-27 ENCOUNTER — Encounter: Payer: Self-pay | Admitting: Physical Therapy

## 2018-11-27 ENCOUNTER — Ambulatory Visit: Payer: BC Managed Care – PPO | Attending: Student | Admitting: Physical Therapy

## 2018-11-27 ENCOUNTER — Other Ambulatory Visit: Payer: Self-pay

## 2018-11-27 DIAGNOSIS — R600 Localized edema: Secondary | ICD-10-CM | POA: Diagnosis not present

## 2018-11-27 DIAGNOSIS — M25661 Stiffness of right knee, not elsewhere classified: Secondary | ICD-10-CM | POA: Diagnosis not present

## 2018-11-27 DIAGNOSIS — R262 Difficulty in walking, not elsewhere classified: Secondary | ICD-10-CM | POA: Insufficient documentation

## 2018-11-27 DIAGNOSIS — M25561 Pain in right knee: Secondary | ICD-10-CM | POA: Insufficient documentation
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
## 2018-11-29 ENCOUNTER — Other Ambulatory Visit: Payer: Self-pay

## 2018-11-29 ENCOUNTER — Encounter: Payer: BC Managed Care – PPO | Admitting: Physical Therapy

## 2018-11-29 ENCOUNTER — Ambulatory Visit: Payer: BC Managed Care – PPO | Admitting: Physical Therapy

## 2018-11-29 DIAGNOSIS — R600 Localized edema: Secondary | ICD-10-CM

## 2018-11-29 DIAGNOSIS — M25561 Pain in right knee: Secondary | ICD-10-CM

## 2018-11-29 DIAGNOSIS — M25661 Stiffness of right knee, not elsewhere classified: Secondary | ICD-10-CM

## 2018-11-29 DIAGNOSIS — R262 Difficulty in walking, not elsewhere classified: Secondary | ICD-10-CM
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
## 2018-11-30 ENCOUNTER — Inpatient Hospital Stay (HOSPITAL_COMMUNITY)
Admission: EM | Admit: 2018-11-30 | Discharge: 2018-12-11 | DRG: 871 | Disposition: A | Discharge status: Home / Self Care | Payer: BC Managed Care – PPO | Attending: Internal Medicine | Admitting: Internal Medicine

## 2018-11-30 ENCOUNTER — Emergency Department (HOSPITAL_COMMUNITY): Payer: BC Managed Care – PPO

## 2018-11-30 ENCOUNTER — Other Ambulatory Visit: Payer: Self-pay

## 2018-11-30 ENCOUNTER — Encounter (HOSPITAL_COMMUNITY): Payer: Self-pay | Admitting: Emergency Medicine

## 2018-11-30 ENCOUNTER — Inpatient Hospital Stay (HOSPITAL_COMMUNITY): Payer: BC Managed Care – PPO

## 2018-11-30 DIAGNOSIS — N1330 Unspecified hydronephrosis: Secondary | ICD-10-CM | POA: Diagnosis not present

## 2018-11-30 DIAGNOSIS — I480 Paroxysmal atrial fibrillation: Secondary | ICD-10-CM | POA: Diagnosis not present

## 2018-11-30 DIAGNOSIS — M1990 Unspecified osteoarthritis, unspecified site: Secondary | ICD-10-CM | POA: Diagnosis present

## 2018-11-30 DIAGNOSIS — Z20828 Contact with and (suspected) exposure to other viral communicable diseases: Secondary | ICD-10-CM | POA: Diagnosis present

## 2018-11-30 DIAGNOSIS — G4730 Sleep apnea, unspecified: Secondary | ICD-10-CM | POA: Diagnosis present

## 2018-11-30 DIAGNOSIS — N179 Acute kidney failure, unspecified: Secondary | ICD-10-CM | POA: Diagnosis present

## 2018-11-30 DIAGNOSIS — G4733 Obstructive sleep apnea (adult) (pediatric): Secondary | ICD-10-CM | POA: Diagnosis present

## 2018-11-30 DIAGNOSIS — Z79899 Other long term (current) drug therapy: Secondary | ICD-10-CM

## 2018-11-30 DIAGNOSIS — E875 Hyperkalemia: Secondary | ICD-10-CM | POA: Diagnosis present

## 2018-11-30 DIAGNOSIS — D649 Anemia, unspecified: Secondary | ICD-10-CM | POA: Diagnosis not present

## 2018-11-30 DIAGNOSIS — I4891 Unspecified atrial fibrillation: Secondary | ICD-10-CM | POA: Diagnosis not present

## 2018-11-30 DIAGNOSIS — M25461 Effusion, right knee: Secondary | ICD-10-CM | POA: Diagnosis not present

## 2018-11-30 DIAGNOSIS — M1711 Unilateral primary osteoarthritis, right knee: Secondary | ICD-10-CM

## 2018-11-30 DIAGNOSIS — N401 Enlarged prostate with lower urinary tract symptoms: Secondary | ICD-10-CM | POA: Diagnosis not present

## 2018-11-30 DIAGNOSIS — N400 Enlarged prostate without lower urinary tract symptoms: Secondary | ICD-10-CM | POA: Diagnosis present

## 2018-11-30 DIAGNOSIS — K567 Ileus, unspecified: Secondary | ICD-10-CM | POA: Diagnosis not present

## 2018-11-30 DIAGNOSIS — R140 Abdominal distension (gaseous): Secondary | ICD-10-CM

## 2018-11-30 DIAGNOSIS — E871 Hypo-osmolality and hyponatremia: Secondary | ICD-10-CM | POA: Diagnosis present

## 2018-11-30 DIAGNOSIS — I10 Essential (primary) hypertension: Secondary | ICD-10-CM | POA: Diagnosis not present

## 2018-11-30 DIAGNOSIS — E876 Hypokalemia: Secondary | ICD-10-CM | POA: Diagnosis not present

## 2018-11-30 DIAGNOSIS — I959 Hypotension, unspecified: Secondary | ICD-10-CM | POA: Diagnosis not present

## 2018-11-30 DIAGNOSIS — R319 Hematuria, unspecified: Secondary | ICD-10-CM | POA: Diagnosis not present

## 2018-11-30 DIAGNOSIS — Z8249 Family history of ischemic heart disease and other diseases of the circulatory system: Secondary | ICD-10-CM

## 2018-11-30 DIAGNOSIS — K219 Gastro-esophageal reflux disease without esophagitis: Secondary | ICD-10-CM | POA: Diagnosis not present

## 2018-11-30 DIAGNOSIS — M7989 Other specified soft tissue disorders: Secondary | ICD-10-CM | POA: Diagnosis present

## 2018-11-30 DIAGNOSIS — Z85828 Personal history of other malignant neoplasm of skin: Secondary | ICD-10-CM

## 2018-11-30 DIAGNOSIS — A419 Sepsis, unspecified organism: Secondary | ICD-10-CM

## 2018-11-30 DIAGNOSIS — K631 Perforation of intestine (nontraumatic): Secondary | ICD-10-CM

## 2018-11-30 DIAGNOSIS — M25561 Pain in right knee: Secondary | ICD-10-CM | POA: Diagnosis present

## 2018-11-30 DIAGNOSIS — K265 Chronic or unspecified duodenal ulcer with perforation: Secondary | ICD-10-CM | POA: Diagnosis not present

## 2018-11-30 DIAGNOSIS — R1110 Vomiting, unspecified: Secondary | ICD-10-CM | POA: Diagnosis not present

## 2018-11-30 DIAGNOSIS — E861 Hypovolemia: Secondary | ICD-10-CM | POA: Diagnosis present

## 2018-11-30 DIAGNOSIS — K2980 Duodenitis without bleeding: Secondary | ICD-10-CM | POA: Diagnosis present

## 2018-11-30 DIAGNOSIS — E785 Hyperlipidemia, unspecified: Secondary | ICD-10-CM | POA: Diagnosis present

## 2018-11-30 DIAGNOSIS — R0602 Shortness of breath: Secondary | ICD-10-CM | POA: Diagnosis not present

## 2018-11-30 DIAGNOSIS — I471 Supraventricular tachycardia: Secondary | ICD-10-CM | POA: Diagnosis not present

## 2018-11-30 DIAGNOSIS — E669 Obesity, unspecified: Secondary | ICD-10-CM | POA: Diagnosis present

## 2018-11-30 DIAGNOSIS — Z886 Allergy status to analgesic agent status: Secondary | ICD-10-CM

## 2018-11-30 DIAGNOSIS — R339 Retention of urine, unspecified: Secondary | ICD-10-CM | POA: Diagnosis not present

## 2018-11-30 DIAGNOSIS — Z87891 Personal history of nicotine dependence: Secondary | ICD-10-CM

## 2018-11-30 DIAGNOSIS — N320 Bladder-neck obstruction: Secondary | ICD-10-CM | POA: Diagnosis not present

## 2018-11-30 DIAGNOSIS — J9601 Acute respiratory failure with hypoxia: Secondary | ICD-10-CM | POA: Diagnosis present

## 2018-11-30 DIAGNOSIS — Z6831 Body mass index (BMI) 31.0-31.9, adult: Secondary | ICD-10-CM | POA: Diagnosis not present

## 2018-11-30 DIAGNOSIS — N9989 Other postprocedural complications and disorders of genitourinary system: Secondary | ICD-10-CM | POA: Diagnosis not present

## 2018-11-30 DIAGNOSIS — R6520 Severe sepsis without septic shock: Secondary | ICD-10-CM | POA: Diagnosis not present

## 2018-11-30 DIAGNOSIS — R112 Nausea with vomiting, unspecified: Secondary | ICD-10-CM | POA: Diagnosis not present

## 2018-11-30 DIAGNOSIS — K261 Acute duodenal ulcer with perforation: Secondary | ICD-10-CM | POA: Diagnosis not present

## 2018-11-30 DIAGNOSIS — Z96651 Presence of right artificial knee joint: Secondary | ICD-10-CM | POA: Diagnosis present

## 2018-11-30 DIAGNOSIS — Z91030 Bee allergy status: Secondary | ICD-10-CM

## 2018-11-30 DIAGNOSIS — Z7901 Long term (current) use of anticoagulants: Secondary | ICD-10-CM

## 2018-11-30 DIAGNOSIS — R600 Localized edema: Secondary | ICD-10-CM | POA: Diagnosis not present

## 2018-11-30 DIAGNOSIS — R338 Other retention of urine: Secondary | ICD-10-CM | POA: Diagnosis not present

## 2018-11-30 DIAGNOSIS — Z79891 Long term (current) use of opiate analgesic: Secondary | ICD-10-CM

## 2018-11-30 LAB — COMPREHENSIVE METABOLIC PANEL
ALT: 32 U/L (ref 0–44)
AST: 33 U/L (ref 15–41)
Albumin: 3.3 g/dL — ABNORMAL LOW (ref 3.5–5.0)
Alkaline Phosphatase: 58 U/L (ref 38–126)
Anion gap: 14 (ref 5–15)
BUN: 55 mg/dL — ABNORMAL HIGH (ref 8–23)
CO2: 22 mmol/L (ref 22–32)
Calcium: 8.5 mg/dL — ABNORMAL LOW (ref 8.9–10.3)
Chloride: 88 mmol/L — ABNORMAL LOW (ref 98–111)
Creatinine, Ser: 4.29 mg/dL — ABNORMAL HIGH (ref 0.61–1.24)
GFR calc Af Amer: 15 mL/min — ABNORMAL LOW (ref >60)
GFR calc non Af Amer: 13 mL/min — ABNORMAL LOW (ref >60)
Glucose, Bld: 127 mg/dL — ABNORMAL HIGH (ref 70–99)
Potassium: 5.3 mmol/L — ABNORMAL HIGH (ref 3.5–5.1)
Sodium: 124 mmol/L — ABNORMAL LOW (ref 135–145)
Total Bilirubin: 1.4 mg/dL — ABNORMAL HIGH (ref 0.3–1.2)
Total Protein: 7 g/dL (ref 6.5–8.1)

## 2018-11-30 LAB — CBC
HCT: 35.4 % — ABNORMAL LOW (ref 39.0–52.0)
Hemoglobin: 11.7 g/dL — ABNORMAL LOW (ref 13.0–17.0)
MCH: 30.8 pg (ref 26.0–34.0)
MCHC: 33.1 g/dL (ref 30.0–36.0)
MCV: 93.2 fL (ref 80.0–100.0)
Platelets: 380 10*3/uL (ref 150–400)
RBC: 3.8 MIL/uL — ABNORMAL LOW (ref 4.22–5.81)
RDW: 12.9 % (ref 11.5–15.5)
WBC: 43.8 10*3/uL — ABNORMAL HIGH (ref 4.0–10.5)
nRBC: 0 % (ref 0.0–0.2)

## 2018-11-30 LAB — URINALYSIS, ROUTINE W REFLEX MICROSCOPIC
Bilirubin Urine: NEGATIVE
Glucose, UA: NEGATIVE mg/dL
Ketones, ur: NEGATIVE mg/dL
Leukocytes,Ua: NEGATIVE
Nitrite: NEGATIVE
Protein, ur: NEGATIVE mg/dL
RBC / HPF: >50 RBC/hpf — ABNORMAL HIGH (ref 0–5)
Specific Gravity, Urine: 1.012 (ref 1.005–1.030)
pH: 6 (ref 5.0–8.0)

## 2018-11-30 LAB — BASIC METABOLIC PANEL
Anion gap: 12 (ref 5–15)
BUN: 49 mg/dL — ABNORMAL HIGH (ref 8–23)
CO2: 24 mmol/L (ref 22–32)
Calcium: 8.3 mg/dL — ABNORMAL LOW (ref 8.9–10.3)
Chloride: 94 mmol/L — ABNORMAL LOW (ref 98–111)
Creatinine, Ser: 3.16 mg/dL — ABNORMAL HIGH (ref 0.61–1.24)
GFR calc Af Amer: 22 mL/min — ABNORMAL LOW (ref >60)
GFR calc non Af Amer: 19 mL/min — ABNORMAL LOW (ref >60)
Glucose, Bld: 89 mg/dL (ref 70–99)
Potassium: 4.6 mmol/L (ref 3.5–5.1)
Sodium: 130 mmol/L — ABNORMAL LOW (ref 135–145)

## 2018-11-30 LAB — DIFFERENTIAL
Basophils Absolute: 0.1 10*3/uL (ref 0.0–0.1)
Basophils Relative: 0 %
Eosinophils Absolute: 0 10*3/uL (ref 0.0–0.5)
Eosinophils Relative: 0 %
Lymphocytes Relative: 5 %
Lymphs Abs: 1.9 10*3/uL (ref 0.7–4.0)
Monocytes Absolute: 4.3 10*3/uL — ABNORMAL HIGH (ref 0.1–1.0)
Monocytes Relative: 11 %
Neutro Abs: 33.7 10*3/uL — ABNORMAL HIGH (ref 1.7–7.7)
Neutrophils Relative %: 83 %

## 2018-11-30 LAB — SARS CORONAVIRUS 2 BY RT PCR (HOSPITAL ORDER, PERFORMED IN ~~LOC~~ HOSPITAL LAB): SARS Coronavirus 2: NEGATIVE

## 2018-11-30 LAB — LACTIC ACID, PLASMA: Lactic Acid, Venous: 1.5 mmol/L (ref 0.5–1.9)

## 2018-11-30 LAB — LIPASE, BLOOD: Lipase: 26 U/L (ref 11–51)

## 2018-11-30 MED ORDER — SODIUM CHLORIDE 0.9% FLUSH: 3.0000 mL | Freq: Once | INTRAVENOUS | Status: DC

## 2018-11-30 MED ORDER — METOPROLOL TARTRATE 5 MG/5ML IV SOLN: 5.0000 mg | Freq: Three times a day (TID) | INTRAVENOUS | Status: DC

## 2018-11-30 MED ORDER — ACETAMINOPHEN 325 MG PO TABS: 650.0000 mg | ORAL_TABLET | Freq: Four times a day (QID) | ORAL | Status: DC | PRN

## 2018-11-30 MED ORDER — ACETAMINOPHEN 650 MG RE SUPP: 650.0000 mg | Freq: Four times a day (QID) | RECTAL | Status: DC | PRN

## 2018-11-30 MED ORDER — POLYETHYL GLYCOL-PROPYL GLYCOL 0.4-0.3 % OP SOLN: 1.0000 [drp] | Freq: Every day | OPHTHALMIC | Status: DC | PRN

## 2018-11-30 MED ORDER — ORAL CARE MOUTH RINSE
15.0000 mL | Freq: Two times a day (BID) | OROMUCOSAL | Status: DC
  Administered 2018-12-01 – 2018-12-11 (×15): 15 mL via OROMUCOSAL

## 2018-11-30 MED ORDER — PIPERACILLIN-TAZOBACTAM IN DEX 2-0.25 GM/50ML IV SOLN
2.2500 g | Freq: Three times a day (TID) | INTRAVENOUS | Status: DC
  Filled 2018-11-30: qty 50

## 2018-11-30 MED ORDER — POLYVINYL ALCOHOL 1.4 % OP SOLN
1.0000 [drp] | Freq: Every day | OPHTHALMIC | Status: DC | PRN
  Filled 2018-11-30: qty 15

## 2018-11-30 MED ORDER — ONDANSETRON HCL 4 MG PO TABS: 4.0000 mg | ORAL_TABLET | Freq: Four times a day (QID) | ORAL | Status: DC | PRN

## 2018-11-30 MED ORDER — SODIUM CHLORIDE 0.9 % IV BOLUS
1000.0000 mL | Freq: Once | INTRAVENOUS | Status: AC
  Administered 2018-11-30: 1000 mL via INTRAVENOUS

## 2018-11-30 MED ORDER — CLOBETASOL PROPIONATE 0.05 % EX CREA: 1.0000 "application " | TOPICAL_CREAM | Freq: Two times a day (BID) | CUTANEOUS | Status: DC | PRN

## 2018-11-30 MED ORDER — CHLORHEXIDINE GLUCONATE CLOTH 2 % EX PADS
6.0000 | MEDICATED_PAD | Freq: Every day | CUTANEOUS | Status: DC
  Administered 2018-12-01 – 2018-12-11 (×11): 6 via TOPICAL

## 2018-11-30 MED ORDER — PIPERACILLIN-TAZOBACTAM IN DEX 2-0.25 GM/50ML IV SOLN
2.2500 g | INTRAVENOUS | Status: AC
  Administered 2018-11-30: 19:00:00 2.25 g via INTRAVENOUS
  Filled 2018-11-30: qty 50

## 2018-11-30 MED ORDER — PANTOPRAZOLE SODIUM 40 MG IV SOLR
40.0000 mg | Freq: Two times a day (BID) | INTRAVENOUS | Status: DC
  Administered 2018-12-01 – 2018-12-07 (×14): 40 mg via INTRAVENOUS
  Filled 2018-11-30 (×14): qty 40

## 2018-11-30 MED ORDER — VANCOMYCIN HCL IN DEXTROSE 1-5 GM/200ML-% IV SOLN: 1000.0000 mg | Freq: Once | INTRAVENOUS | Status: DC

## 2018-11-30 MED ORDER — VANCOMYCIN VARIABLE DOSE PER UNSTABLE RENAL FUNCTION (PHARMACIST DOSING): Status: DC

## 2018-11-30 MED ORDER — METRONIDAZOLE IN NACL 5-0.79 MG/ML-% IV SOLN: 500.0000 mg | Freq: Once | INTRAVENOUS | Status: DC

## 2018-11-30 MED ORDER — VANCOMYCIN HCL 10 G IV SOLR
2000.0000 mg | INTRAVENOUS | Status: AC
  Administered 2018-11-30: 18:00:00 2000 mg via INTRAVENOUS
  Filled 2018-11-30: qty 2000

## 2018-11-30 MED ORDER — ALBUTEROL SULFATE (2.5 MG/3ML) 0.083% IN NEBU: 2.5000 mg | INHALATION_SOLUTION | RESPIRATORY_TRACT | Status: DC | PRN

## 2018-11-30 MED ORDER — METOPROLOL TARTRATE 5 MG/5ML IV SOLN
2.5000 mg | Freq: Three times a day (TID) | INTRAVENOUS | Status: DC
  Administered 2018-11-30 – 2018-12-02 (×5): 2.5 mg via INTRAVENOUS
  Filled 2018-11-30 (×5): qty 5

## 2018-11-30 MED ORDER — ONDANSETRON HCL 4 MG/2ML IJ SOLN
4.0000 mg | Freq: Four times a day (QID) | INTRAMUSCULAR | Status: DC | PRN
  Administered 2018-12-03 – 2018-12-05 (×2): 4 mg via INTRAVENOUS
  Filled 2018-11-30 (×2): qty 2

## 2018-11-30 MED ORDER — DILTIAZEM HCL 100 MG IV SOLR
5.0000 mg/h | INTRAVENOUS | Status: AC
  Administered 2018-11-30: 20:00:00 5 mg/h via INTRAVENOUS
  Administered 2018-12-01 – 2018-12-02 (×4): 15 mg/h via INTRAVENOUS
  Filled 2018-11-30 (×6): qty 100

## 2018-11-30 MED ORDER — SODIUM CHLORIDE 0.9 % IV SOLN
INTRAVENOUS | Status: DC
  Administered 2018-11-30 – 2018-12-03 (×5): via INTRAVENOUS

## 2018-11-30 NOTE — ED Triage Notes (Signed)
Patient c/o swelling to abdomen causing intermittent SOB x2 days. Denies N/V/D. States last BM today. Had knee replacement 8/4

## 2018-11-30 NOTE — ED Notes (Signed)
Patient transported to X-ray 
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
## 2018-11-30 NOTE — Progress Notes (Signed)
Pharmacy Antibiotic Note  Philip Jackson is a 68 y.o. male admitted on 11/30/2018 with sepsis secondary to intra-abdominal process/duodenitis vs. knee infection.  Pharmacy has been consulted for Vancomycin and Zosyn dosing.  Plan: Vancomycin 2g IV x 1 given in the ED. Given AKI, will PRN dose Vancomycin based on levels at this time.  Zosyn 2.25g IV q8h. Monitor renal function, cultures, clinical course.      Temp (24hrs), Avg:97.8 F (36.6 C), Min:97.8 F (36.6 C), Max:97.8 F (36.6 C)  Recent Labs  Lab 11/30/18 1445 11/30/18 1622  WBC 43.8*  --   CREATININE 4.29*  --   LATICACIDVEN  --  1.5    Estimated Creatinine Clearance: 18.6 mL/min (A) (by C-G formula based on SCr of 4.29 mg/dL (H)).    Allergies  Allergen Reactions  . Bee Pollen Anaphylaxis    Allergic to bees    Antimicrobials this admission: 8/13 Vancomycin >> 8/13 Zosyn >>  Microbiology results: 8/13 BCx: sent 8/13 COVID: negative    Thank you for allowing pharmacy to be a part of this patient's care.   Lindell Spar, PharmD, BCPS Clinical Pharmacist  11/30/2018 7:50 PM

## 2018-11-30 NOTE — Progress Notes (Signed)
Patient admitted with Afib with RVR, respiratory failure, ? duodenal perforation and sepsis s/p TKA on 11/21/18 by Dr. Wynelle Link. EDP states knee appears benign. Dr. Wynelle Link will check on him tomorrow.

## 2018-11-30 NOTE — Progress Notes (Signed)
A consult was received from an ED provider for Vancomycin and Zosyn per pharmacy dosing.  The patient's profile has been reviewed for ht/wt/allergies/indication/available labs.    A one time order has been placed for Vancomycin 2g IV and Zosyn 2.25g IV.  Further antibiotics/pharmacy consults should be ordered by admitting physician if indicated.                       Thank you, Luiz Ochoa 11/30/2018  5:13 PM
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
## 2018-12-01 ENCOUNTER — Encounter (HOSPITAL_COMMUNITY): Payer: Self-pay | Admitting: *Deleted

## 2018-12-01 ENCOUNTER — Ambulatory Visit: Payer: BC Managed Care – PPO

## 2018-12-01 LAB — CBC
HCT: 34.8 % — ABNORMAL LOW (ref 39.0–52.0)
Hemoglobin: 11 g/dL — ABNORMAL LOW (ref 13.0–17.0)
MCH: 29.9 pg (ref 26.0–34.0)
MCHC: 31.6 g/dL (ref 30.0–36.0)
MCV: 94.6 fL (ref 80.0–100.0)
Platelets: 298 10*3/uL (ref 150–400)
RBC: 3.68 MIL/uL — ABNORMAL LOW (ref 4.22–5.81)
RDW: 12.9 % (ref 11.5–15.5)
WBC: 29.4 10*3/uL — ABNORMAL HIGH (ref 4.0–10.5)
nRBC: 0 % (ref 0.0–0.2)

## 2018-12-01 LAB — BASIC METABOLIC PANEL
Anion gap: 12 (ref 5–15)
BUN: 39 mg/dL — ABNORMAL HIGH (ref 8–23)
CO2: 20 mmol/L — ABNORMAL LOW (ref 22–32)
Calcium: 8 mg/dL — ABNORMAL LOW (ref 8.9–10.3)
Chloride: 100 mmol/L (ref 98–111)
Creatinine, Ser: 2.15 mg/dL — ABNORMAL HIGH (ref 0.61–1.24)
GFR calc Af Amer: 35 mL/min — ABNORMAL LOW (ref >60)
GFR calc non Af Amer: 31 mL/min — ABNORMAL LOW (ref >60)
Glucose, Bld: 99 mg/dL (ref 70–99)
Potassium: 4.2 mmol/L (ref 3.5–5.1)
Sodium: 132 mmol/L — ABNORMAL LOW (ref 135–145)

## 2018-12-01 MED ORDER — SODIUM CHLORIDE 0.9 % IV SOLN
INTRAVENOUS | Status: DC | PRN
  Administered 2018-12-01 – 2018-12-05 (×2): 250 mL via INTRAVENOUS

## 2018-12-01 MED ORDER — PIPERACILLIN-TAZOBACTAM 3.375 G IVPB
3.3750 g | Freq: Three times a day (TID) | INTRAVENOUS | Status: DC
  Administered 2018-12-01 – 2018-12-05 (×15): 3.375 g via INTRAVENOUS
  Filled 2018-12-01 (×14): qty 50

## 2018-12-01 NOTE — Progress Notes (Signed)
Subjective: [name] says he feels a lot better today. Abdominal pain improving. He feels as though the right knee is doing really well   Objective: Vital signs in last 24 hours: Temp:  [97.6 F (36.4 C)-98.2 F (36.8 C)] 98.2 F (36.8 C) (08/14 0430) Pulse Rate:  [81-128] 92 (08/14 0700) Resp:  [13-24] 13 (08/14 0700) BP: (101-150)/(62-113) 117/70 (08/14 0700) SpO2:  [83 %-99 %] 98 % (08/14 0700) Weight:  [97.2 kg] 97.2 kg (08/13 2120)  Intake/Output from previous day: 08/13 0701 - 08/14 0700 In: 2452.9 [I.V.:803.4; IV Piggyback:1649.6] Out: 6550 [Urine:6550] Intake/Output this shift: No intake/output data recorded.  Recent Labs    11/30/18 1445 12/01/18 0221  HGB 11.7* 11.0*   Recent Labs    11/30/18 1445 12/01/18 0221  WBC 43.8* 29.4*  RBC 3.80* 3.68*  HCT 35.4* 34.8*  PLT 380 298   Recent Labs    11/30/18 1920 12/01/18 0221  NA 130* 132*  K 4.6 4.2  CL 94* 100  CO2 24 20*  BUN 49* 39*  CREATININE 3.16* 2.15*  GLUCOSE 89 99  CALCIUM 8.3* 8.0*   No results for input(s): LABPT, INR in the last 72 hours.  Neurovascular intact No cellulitis present Compartment soft Right knee with minimal swelling. No erythema. No signs of ifection    Assessment/Plan: S/P right TKA- All of the issues appear to be stemming from the duodenal perforation. His knee looks great with no signs of infection. I have ordered a PT consult to continue working on his knee while in the hospital     Philip Jackson 12/01/2018, 7:46 AM

## 2018-12-01 NOTE — Progress Notes (Signed)
Patient ID: Philip Jackson, male   DOB: 1950/09/30, 68 y.o.   MRN: [id number]       Subjective: Feels much better today.  Had a BM last night.  No nausea or vomiting.  Objective: Vital signs in last 24 hours: Temp:  [97.6 F (36.4 C)-98.2 F (36.8 C)] 98.2 F (36.8 C) (08/14 0430) Pulse Rate:  [81-128] 92 (08/14 0700) Resp:  [13-24] 13 (08/14 0700) BP: (101-150)/(62-113) 117/70 (08/14 0700) SpO2:  [83 %-99 %] 98 % (08/14 0700) Weight:  [97.2 kg] 97.2 kg (08/13 2120) Last BM Date: 11/29/18  Intake/Output from previous day: 08/13 0701 - 08/14 0700 In: 2452.9 [I.V.:803.4; IV Piggyback:1649.6] Out: 6550 [Urine:6550] Intake/Output this shift: No intake/output data recorded.  PE: Abd: soft, tender in RUQ, but only with deep palpation, +BS, rotund, but not clearly distended.  Lab Results:  Recent Labs    11/30/18 1445 12/01/18 0221  WBC 43.8* 29.4*  HGB 11.7* 11.0*  HCT 35.4* 34.8*  PLT 380 298   BMET Recent Labs    11/30/18 1920 12/01/18 0221  NA 130* 132*  K 4.6 4.2  CL 94* 100  CO2 24 20*  GLUCOSE 89 99  BUN 49* 39*  CREATININE 3.16* 2.15*  CALCIUM 8.3* 8.0*   PT/INR No results for input(s): LABPROT, INR in the last 72 hours. CMP     Component Value Date/Time   NA 132 (L) 12/01/2018 0221   K 4.2 12/01/2018 0221   CL 100 12/01/2018 0221   CO2 20 (L) 12/01/2018 0221   GLUCOSE 99 12/01/2018 0221   BUN 39 (H) 12/01/2018 0221   CREATININE 2.15 (H) 12/01/2018 0221   CALCIUM 8.0 (L) 12/01/2018 0221   PROT 7.0 11/30/2018 1445   ALBUMIN 3.3 (L) 11/30/2018 1445   AST 33 11/30/2018 1445   ALT 32 11/30/2018 1445   ALKPHOS 58 11/30/2018 1445   BILITOT 1.4 (H) 11/30/2018 1445   GFRNONAA 31 (L) 12/01/2018 0221   GFRAA 35 (L) 12/01/2018 0221   Lipase     Component Value Date/Time   LIPASE 26 11/30/2018 1445       Studies/Results: Ct Abdomen Pelvis Wo Contrast  Result Date: 11/30/2018 CLINICAL DATA:  Abdominal distension, intermittent shortness of breath  for 2 days, unable to urinate EXAM: CT ABDOMEN AND PELVIS WITHOUT CONTRAST TECHNIQUE: Multidetector CT imaging of the abdomen and pelvis was performed following the standard protocol without IV contrast. COMPARISON:  None. FINDINGS: Lower chest: Trace right pleural effusion. Bandlike areas of scarring and/or atelectasis are present in both bases. Cardiomegaly. Coronary artery calcifications are noted. Trace pericardial fluid likely within physiologic normal. Hepatobiliary: No focal liver abnormality is seen. No gallstones, gallbladder wall thickening, or biliary dilatation. Pancreas: Partial fatty replacement of the pancreas. Mild stranding near the pancreatic head appears likely reactive to the adjacent process rather than centered upon the pancreas itself. Spleen: Normal in size without focal abnormality. Adrenals/Urinary Tract: Adrenal glands are unremarkable. There is asymmetrically increased right perinephric stranding and dilatation of the right ureter without visualization of a an obstructing ureteral calculus. There is abrupt narrowing of the ureteral caliber as it courses adjacent to the retroperitoneal stranding and inflammation detailed in the stomach/bowel section below. Stomach/Bowel: The distal esophagus is normal. The stomach is unremarkable. There is focal circumferential thickening of the duodenal sweep with extensive adjacent periduodenal stranding and phlegmonous change and likely reactive free fluid in the retroperitoneum, insinuating predominantly through the anterior pararenal space. No intraperitoneal or retroperitoneal extraluminal gas is identified. More  distal small bowel is fluid-filled but within otherwise normal appearance. A appendix is present in the right lower quadrant coursing to the right upper quadrant. Limited fecalization of the large bowel contents with air and fluid seen in the distal colon. Vascular/Lymphatic: Limited evaluation the absence of contrast. Atherosclerotic  calcification of the aorta and several branch vessels. No aneurysmal dilatation. Scattered reactive appearing lymph nodes are present in the upper abdomen and retroperitoneum. No suspicious or enlarged lymph nodes in the included lymphatic chains. Reproductive: The prostate and seminal vesicles are unremarkable. Other: No abdominal wall hernia or abnormality. No abdominopelvic ascites. Musculoskeletal: Small amount of subcutaneous gas tracking within the fascial planes of the right lower extremity with circumferential soft tissue swelling asymmetrically increased in the right proximal thigh. Multilevel degenerative changes are present in the imaged portions of the spine. IMPRESSION: 1. Focal circumferential thickening of the duodenal sweep with extensive adjacent periduodenal stranding and phlegmonous change and likely reactive free fluid in the anterior pararenal space of the retroperitoneum. Findings are concerning for duodenal ulcer with possible perforation, particularly given history of recent surgery. 2. Asymmetric right perinephric stranding with dilatation urothelial thickening of the right ureter without visualization of an obstructing ureteral calculus, which is favored to be reactive partial obstruction secondary to the adjacent pararenal process. 3. Small amount of subcutaneous gas tracking within the fascial planes of the right lower extremity with circumferential soft tissue swelling asymmetrically increased in the right proximal thigh, possibly related to recent lower extremity surgery though should correlate with exam findings and clinically exclude the presence of aggressive soft tissue infection. 4. Aortic Atherosclerosis (ICD10-I70.0). These results were called by telephone at the time of interpretation on 11/30/2018 at 4:57 pm to [facility], who verbally acknowledged these results. Electronically Signed   By: Lovena Le M.D.   On: 11/30/2018 17:00   Dg Chest Portable 1 View  Result Date:  11/30/2018 CLINICAL DATA:  Shortness of breath EXAM: PORTABLE CHEST 1 VIEW COMPARISON:  06/17/2014 FINDINGS: Low lung volumes. Linear atelectasis in the right upper lobe. Cardiomegaly. Possible tiny pleural effusions. No pneumothorax. IMPRESSION: Low lung volumes.  Cardiomegaly with possible tiny effusions. Electronically Signed   By: Donavan Foil M.D.   On: 11/30/2018 16:05   Dg Knee Complete 4 Views Right  Result Date: 11/30/2018 CLINICAL DATA:  Gas on CT, right knee replacement 11/21/2018 EXAM: RIGHT KNEE - COMPLETE 4+ VIEW COMPARISON:  None. 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 There is extensive soft tissue swelling and edema with overlying skin thickening. Large right knee joint effusion is present. Hardware remains appropriately aligned. No periprosthetic fracture is seen. Few radiodensities about the joint line may be postsurgical in nature. Radiolucent screw tracks are noted in the proximal tibia and likely reflect prior hardware removal. IMPRESSION: Extensive swelling and soft tissue edema about the right knee with large joint effusion. Fall findings may be in part postsurgical, recommend correlation with clinical findings to exclude infection. Electronically Signed   By: Lovena Le M.D.   On: 11/30/2018 20:12   Dg Femur 1v Right  Result Date: 11/30/2018 CLINICAL DATA:  Gas on CT, evaluate for further foci EXAM: RIGHT FEMUR 1 VIEW COMPARISON:  CT abdomen pelvis same day FINDINGS: Extensive soft tissue swelling extends from the right hip to the level of the right knee. The right femur is intact. Right femoral head remains normally located. Included portions of the right pelvis are unremarkable. No visible soft tissue gas on  this radiographic exam. Patient is post total right knee arthroplasty. IMPRESSION: 1. Extensive soft tissue swelling extending from the right hip to the level of the right knee. No additional foci of subcutaneous gas.  This is greater than expected for the postsurgical state. Absence of gas does not necessarily exclude the presence of soft tissue infection which is a clinical diagnosis. Recommend correlation with exam findings and laboratory markers such as LRINEC score. 2. No acute osseous abnormality.  Hardware intact. Electronically Signed   By: Lovena Le M.D.   On: 11/30/2018 20:15    Anti-infectives: Anti-infectives (From admission, onward)   Start     Dose/Rate Route Frequency Ordered Stop   12/01/18 0400  piperacillin-tazobactam (ZOSYN) IVPB 2.25 g  Status:  Discontinued     2.25 g 100 mL/hr over 30 Minutes Intravenous Every 8 hours 11/30/18 1838 12/01/18 0204   12/01/18 0400  piperacillin-tazobactam (ZOSYN) IVPB 3.375 g     3.375 g 12.5 mL/hr over 240 Minutes Intravenous Every 8 hours 12/01/18 0204     11/30/18 1839  vancomycin variable dose per unstable renal function (pharmacist dosing)      Does not apply See admin instructions 11/30/18 1839     11/30/18 1715  piperacillin-tazobactam (ZOSYN) IVPB 2.25 g     2.25 g 100 mL/hr over 30 Minutes Intravenous STAT 11/30/18 1708 11/30/18 1955   11/30/18 1715  vancomycin (VANCOCIN) 2,000 mg in sodium chloride 0.9 % 500 mL IVPB     2,000 mg 250 mL/hr over 120 Minutes Intravenous STAT 11/30/18 1711 11/30/18 2030   11/30/18 1700  metroNIDAZOLE (FLAGYL) IVPB 500 mg  Status:  Discontinued     500 mg 100 mL/hr over 60 Minutes Intravenous  Once 11/30/18 1657 11/30/18 1710   11/30/18 1700  vancomycin (VANCOCIN) IVPB 1000 mg/200 mL premix  Status:  Discontinued     1,000 mg 200 mL/hr over 60 Minutes Intravenous  Once 11/30/18 1657 11/30/18 1711       Assessment/Plan  Duodenitis with possible RTP perforation -cont strict NPO for now -pain is improving.  No plans for surgical intervention at this time.  Will plan for conservative management with further abx therapy and follow closely -mobilize with PT/OT  FEN - NPO, IVFs VTE - SCDs ID - vanc/zosyn    LOS: 1 day    Henreitta Cea , [facility] 12/01/2018, 8:08 AM Pager: [phone]

## 2018-12-01 NOTE — Progress Notes (Signed)
PHARMACY NOTE:  ANTIMICROBIAL RENAL DOSAGE ADJUSTMENT  Current antimicrobial regimen includes a mismatch between antimicrobial dosage and estimated renal function.  As per policy approved by the Pharmacy & Therapeutics and Medical Executive Committees, the antimicrobial dosage will be adjusted accordingly.  Current antimicrobial dosage:   Piperacillin/tazobactam 2.25 g IV q8h  Indication: Intra-abdominal infection  Renal Function:  Estimated Creatinine Clearance: 25.7 mL/min (A) (by C-G formula based on SCr of 3.16 mg/dL (H)).  Given improvement in renal function, will increase dose of piperacillin/tazobactam    Antimicrobial dosage has been changed to:   Piperacillin/tazobactam 3.375 g IV q8h EI  Thank you for allowing pharmacy to be a part of this patient's care.  [place], [facility] 12/01/2018 2:05 AM

## 2018-12-01 NOTE — Progress Notes (Signed)
PROGRESS NOTE    Philip Jackson  ZJQ:[id number] DOB: 05/26/1950 DOA: 11/30/2018 PCP: Leonides Sake, MD   Brief Narrative: 68 year old with past medical history significant for A. fib on Eliquis, GERD, hypertension, OSA who presented complaining of acute onset progressively getting worse abdominal pain and distention since 2 days prior to admission.  Patient had a recent arthroplasty by Dr. Wynelle Link on 11/20/2018.  Patient has been also complaining of swelling in his right leg since surgery. Patient presented with AKI with a creatinine of 4.2, hypoxemia, A. fib with RVR, signs of sepsis, CT abdomen showed possible duodenitis and perforation.  Surgery was consulted they are recommending conservative management.  Assessment & Plan:   Principal Problem:   AKI (acute kidney injury) ([facility]) Active Problems:   HTN (hypertension)   Atrial fibrillation with RVR (HCC)   HLD (hyperlipidemia)   OSA treated with BiPAP   Duodenitis   Urine retention   Hyperkalemia    1-A fib RVR:  Likely related to acute illness.  Continue  Cardizem gtt. IV metoprolol.  Holding eliquis due to duodenitis, monitor for GI bleed and need for surgery.  Continue to hold anticoagulation today.  Resume when is okay by surgery  2-Sepsis; patient presents with leukocytosis, tachycardia, tachypnea.   might be related intra-abdominal process, or knee infection.  Blood culture ordered.  Continue with IV Zosyn.  Now the orthopedic does not have any concern with knee infection will discontinue vancomycin. IV fluids.  White blood cell trending down today at 29.  3-AKI; presents with cr at 4. Could be related to obstructive uropathy, Hypovolemia, ACE, sepsis.  Has foley catheter, yielding 2 L urine.  Improving, creatinine today at 2.  Continue with IV fluids   4-Duodenitis; ? Perforation.  Discussed case with Dr Marcello Moores, she reviewed CT abdomen, she doesn't see free air. If he has perforation is contain.  Support care and  monitor closely.  Appreciate surgery evaluation. Continue n.p.o. status, IV fluids, IV Protonix, IV antibiotics. Per surgery patient might need CT scan early next week.  5-Acute hypoxic respiratory failure; related to intra-abdominal process, decrease lung compliance.  He report improvement of dyspnea.  Oxygen sat improved on 2 L.  Monitor.   6-Hyperkalemia;  Repeat b-met. He received IV fluids.  Suspect related to AKI.  Resolved.  Hyponatremia; related to hypovolemia. IV fluids.  Improved.  Right knee, recent sx; redness and gas on CT abdomen.  Orthopedic has been consulted.  Appreciate Dr. Wynelle Link evaluation.  No signs of infection.  Constipated.  Defer to surgery.    Estimated body mass index is 31.64 kg/m as calculated from the following:   Height as of this encounter: 5' 9" (1.753 m).   Weight as of this encounter: 97.2 kg.   DVT prophylaxis: scd Code Status: full code Family Communication: care discussed with patient  Disposition Plan: remain in the step down, for care of possible duodenal perforation, contain. AKI Consultants:   Surgery   Procedures:   none  Antimicrobials:    Subjective: He is feeling better, , denies dyspnea.  Denies significant abdominal pain.   Objective: Vitals:   12/01/18 0500 12/01/18 0600 12/01/18 0637 12/01/18 0700  BP: 119/76 116/76 109/67 117/70  Pulse: (!) 113 98 97 92  Resp: _0 Temp:      TempSrc:      SpO2: 91% 97% 97% 98%  Weight:      Height:        Intake/Output Summary (Last 24  hours) at 12/01/2018 0717 Last data filed at 12/01/2018 0600 Gross per 24 hour  Intake 2452.94 ml  Output 6550 ml  Net -4097.06 ml   Filed Weights   11/30/18 2120  Weight: 97.2 kg    Examination:  General exam: Appears calm and comfortable  Respiratory system: Clear to auscultation. Respiratory effort normal. Cardiovascular system: S1 & S2 heard, RRR. No JVD, murmurs, rubs, gallops or clicks. No pedal edema.  Gastrointestinal system: BS present, distended, No rigidity  Central nervous system: Alert and oriented. No focal neurological deficits. Extremities: Symmetric 5 x 5 power. Skin: No rashes, lesions or ulcers Psychiatry: Judgement and insight appear normal. Mood & affect appropriate.     Data Reviewed: I have personally reviewed following labs and imaging studies  CBC: Recent Labs  Lab 11/30/18 1445 12/01/18 0221  WBC 43.8* 29.4*  NEUTROABS 33.7*  --   HGB 11.7* 11.0*  HCT 35.4* 34.8*  MCV 93.2 94.6  PLT 380 197   Basic Metabolic Panel: Recent Labs  Lab 11/30/18 1445 11/30/18 1920 12/01/18 0221  NA 124* 130* 132*  K 5.3* 4.6 4.2  CL 88* 94* 100  CO2 22 24 20*  GLUCOSE 127* 89 99  BUN 55* 49* 39*  CREATININE 4.29* 3.16* 2.15*  CALCIUM 8.5* 8.3* 8.0*   GFR: Estimated Creatinine Clearance: 37.8 mL/min (A) (by C-G formula based on SCr of 2.15 mg/dL (H)). Liver Function Tests: Recent Labs  Lab 11/30/18 1445  AST 33  ALT 32  ALKPHOS 58  BILITOT 1.4*  PROT 7.0  ALBUMIN 3.3*   Recent Labs  Lab 11/30/18 1445  LIPASE 26   No results for input(s): AMMONIA in the last 168 hours. Coagulation Profile: No results for input(s): INR, PROTIME in the last 168 hours. Cardiac Enzymes: No results for input(s): CKTOTAL, CKMB, CKMBINDEX, TROPONINI in the last 168 hours. BNP (last 3 results) No results for input(s): PROBNP in the last 8760 hours. HbA1C: No results for input(s): HGBA1C in the last 72 hours. CBG: No results for input(s): GLUCAP in the last 168 hours. Lipid Profile: No results for input(s): CHOL, HDL, LDLCALC, TRIG, CHOLHDL, LDLDIRECT in the last 72 hours. Thyroid Function Tests: No results for input(s): TSH, T4TOTAL, FREET4, T3FREE, THYROIDAB in the last 72 hours. Anemia Panel: No results for input(s): VITAMINB12, FOLATE, FERRITIN, TIBC, IRON, RETICCTPCT in the last 72 hours. Sepsis Labs: Recent Labs  Lab 11/30/18 1622  LATICACIDVEN 1.5    Recent  Results (from the past 240 hour(s))  SARS Coronavirus 2 [facility] order, Performed in [facility] hospital lab) Nasopharyngeal Nasopharyngeal Swab     Status: None   Collection Time: 11/30/18  5:54 PM   Specimen: Nasopharyngeal Swab  Result Value Ref Range Status   SARS Coronavirus 2 NEGATIVE NEGATIVE Final    Comment: (NOTE) If result is NEGATIVE SARS-CoV-2 target nucleic acids are NOT DETECTED. The SARS-CoV-2 RNA is generally detectable in upper and lower  respiratory specimens during the acute phase of infection. The lowest  concentration of SARS-CoV-2 viral copies this assay can detect is 250  copies / mL. A negative result does not preclude SARS-CoV-2 infection  and should not be used as the sole basis for treatment or other  patient management decisions.  A negative result may occur with  improper specimen collection / handling, submission of specimen other  than nasopharyngeal swab, presence of viral mutation(s) within the  areas targeted by this assay, and inadequate number of viral copies  (<250 copies / mL). A  negative result must be combined with clinical  observations, patient history, and epidemiological information. If result is POSITIVE SARS-CoV-2 target nucleic acids are DETECTED. The SARS-CoV-2 RNA is generally detectable in upper and lower  respiratory specimens dur ing the acute phase of infection.  Positive  results are indicative of active infection with SARS-CoV-2.  Clinical  correlation with patient history and other diagnostic information is  necessary to determine patient infection status.  Positive results do  not rule out bacterial infection or co-infection with other viruses. If result is PRESUMPTIVE POSTIVE SARS-CoV-2 nucleic acids MAY BE PRESENT.   A presumptive positive result was obtained on the submitted specimen  and confirmed on repeat testing.  While 2019 novel coronavirus  (SARS-CoV-2) nucleic acids may be present in the submitted sample   additional confirmatory testing may be necessary for epidemiological  and / or clinical management purposes  to differentiate between  SARS-CoV-2 and other Sarbecovirus currently known to infect humans.  If clinically indicated additional testing with an alternate test  methodology [phone]) is advised. The SARS-CoV-2 RNA is generally  detectable in upper and lower respiratory sp ecimens during the acute  phase of infection. The expected result is Negative. Fact Sheet for Patients:  StrictlyIdeas.no Fact Sheet for Healthcare Providers: BankingDealers.co.za This test is not yet approved or cleared by the [place] FDA and has been authorized for detection and/or diagnosis of SARS-CoV-2 by FDA under an Emergency Use Authorization (EUA).  This EUA will remain in effect (meaning this test can be used) for the duration of the COVID-19 declaration under Section 564(b)(1) of the Act, 21 U.S.C. section 360bbb-3(b)(1), unless the authorization is terminated or revoked sooner. Performed at [facility], [place] [address], [place],  [address]          Radiology Studies: Ct Abdomen Pelvis Wo Contrast  Result Date: 11/30/2018 CLINICAL DATA:  Abdominal distension, intermittent shortness of breath for 2 days, unable to urinate EXAM: CT ABDOMEN AND PELVIS WITHOUT CONTRAST TECHNIQUE: Multidetector CT imaging of the abdomen and pelvis was performed following the standard protocol without IV contrast. COMPARISON:  None. FINDINGS: Lower chest: Trace right pleural effusion. Bandlike areas of scarring and/or atelectasis are present in both bases. Cardiomegaly. Coronary artery calcifications are noted. Trace pericardial fluid likely within physiologic normal. Hepatobiliary: No focal liver abnormality is seen. No gallstones, gallbladder wall thickening, or biliary dilatation. Pancreas: Partial fatty replacement of the pancreas. Mild  stranding near the pancreatic head appears likely reactive to the adjacent process rather than centered upon the pancreas itself. Spleen: Normal in size without focal abnormality. Adrenals/Urinary Tract: Adrenal glands are unremarkable. There is asymmetrically increased right perinephric stranding and dilatation of the right ureter without visualization of a an obstructing ureteral calculus. There is abrupt narrowing of the ureteral caliber as it courses adjacent to the retroperitoneal stranding and inflammation detailed in the stomach/bowel section below. Stomach/Bowel: The distal esophagus is normal. The stomach is unremarkable. There is focal circumferential thickening of the duodenal sweep with extensive adjacent periduodenal stranding and phlegmonous change and likely reactive free fluid in the retroperitoneum, insinuating predominantly through the anterior pararenal space. No intraperitoneal or retroperitoneal extraluminal gas is identified. More distal small bowel is fluid-filled but within otherwise normal appearance. A appendix is present in the right lower quadrant coursing to the right upper quadrant. Limited fecalization of the large bowel contents with air and fluid seen in the distal colon. Vascular/Lymphatic: Limited evaluation the absence of contrast. Atherosclerotic calcification of the aorta and  several branch vessels. No aneurysmal dilatation. Scattered reactive appearing lymph nodes are present in the upper abdomen and retroperitoneum. No suspicious or enlarged lymph nodes in the included lymphatic chains. Reproductive: The prostate and seminal vesicles are unremarkable. Other: No abdominal wall hernia or abnormality. No abdominopelvic ascites. Musculoskeletal: Small amount of subcutaneous gas tracking within the fascial planes of the right lower extremity with circumferential soft tissue swelling asymmetrically increased in the right proximal thigh. Multilevel degenerative changes are present in  the imaged portions of the spine. IMPRESSION: 1. Focal circumferential thickening of the duodenal sweep with extensive adjacent periduodenal stranding and phlegmonous change and likely reactive free fluid in the anterior pararenal space of the retroperitoneum. Findings are concerning for duodenal ulcer with possible perforation, particularly given history of recent surgery. 2. Asymmetric right perinephric stranding with dilatation urothelial thickening of the right ureter without visualization of an obstructing ureteral calculus, which is favored to be reactive partial obstruction secondary to the adjacent pararenal process. 3. Small amount of subcutaneous gas tracking within the fascial planes of the right lower extremity with circumferential soft tissue swelling asymmetrically increased in the right proximal thigh, possibly related to recent lower extremity surgery though should correlate with exam findings and clinically exclude the presence of aggressive soft tissue infection. 4. Aortic Atherosclerosis (ICD10-I70.0). These results were called by telephone at the time of interpretation on 11/30/2018 at 4:57 pm to [facility] PA, who verbally acknowledged these results. Electronically Signed   By: Lovena Le M.D.   On: 11/30/2018 17:00   Dg Chest Portable 1 View  Result Date: 11/30/2018 CLINICAL DATA:  Shortness of breath EXAM: PORTABLE CHEST 1 VIEW COMPARISON:  06/17/2014 FINDINGS: Low lung volumes. Linear atelectasis in the right upper lobe. Cardiomegaly. Possible tiny pleural effusions. No pneumothorax. IMPRESSION: Low lung volumes.  Cardiomegaly with possible tiny effusions. Electronically Signed   By: Donavan Foil M.D.   On: 11/30/2018 16:05   Dg Knee Complete 4 Views Right  Result Date: 11/30/2018 CLINICAL DATA:  Gas on CT, right knee replacement 11/21/2018 EXAM: RIGHT KNEE - COMPLETE 4+ VIEW COMPARISON:  None. FINDINGS: Patient is post total right knee arthroplasty with posterior patellar  resurfacing. There is extensive soft tissue swelling and edema with overlying skin thickening. Large right knee joint effusion is present. Hardware remains appropriately aligned. No periprosthetic fracture is seen. Few radiodensities about the joint line may be postsurgical in nature. Radiolucent screw tracks are noted in the proximal tibia and likely reflect prior hardware removal. IMPRESSION: Extensive swelling and soft tissue edema about the right knee with large joint effusion. Fall findings may be in part postsurgical, recommend correlation with clinical findings to exclude infection. Electronically Signed   By: Lovena Le M.D.   On: 11/30/2018 20:12   Dg Femur 1v Right  Result Date: 11/30/2018 CLINICAL DATA:  Gas on CT, evaluate for further foci EXAM: RIGHT FEMUR 1 VIEW COMPARISON:  CT abdomen pelvis same day FINDINGS: Extensive soft tissue swelling extends from the right hip to the level of the right knee. The right femur is intact. Right femoral head remains normally located. Included portions of the right pelvis are unremarkable. No visible soft tissue gas on this radiographic exam. Patient is post total right knee arthroplasty. IMPRESSION: 1. Extensive soft tissue swelling extending from the right hip to the level of the right knee. No additional foci of subcutaneous gas. This is greater than expected for the postsurgical state. Absence of gas does not necessarily exclude the presence of soft  tissue infection which is a clinical diagnosis. Recommend correlation with exam findings and laboratory markers such as LRINEC score. 2. No acute osseous abnormality.  Hardware intact. Electronically Signed   By: Lovena Le M.D.   On: 11/30/2018 20:15        Scheduled Meds: . Chlorhexidine Gluconate Cloth  6 each Topical Daily  . mouth rinse  15 mL Mouth Rinse BID  . metoprolol tartrate  2.5 mg Intravenous Q8H  . pantoprazole (PROTONIX) IV  40 mg Intravenous Q12H  . sodium chloride flush  3 mL  Intravenous Once  . vancomycin variable dose per unstable renal function (pharmacist dosing)   Does not apply See admin instructions   Continuous Infusions: . sodium chloride Stopped (12/01/18 0428)  . diltiazem (CARDIZEM) infusion 15 mg/hr (12/01/18 0600)  . piperacillin-tazobactam (ZOSYN)  IV 12.5 mL/hr at 12/01/18 0600     LOS: 1 day    Time spent: 35 minutes.     Elmarie Shiley, MD [facility] Pager [phone]  If 7PM-7AM, please contact night-coverage [email or web address] Password [facility] 12/01/2018, 7:17 AM
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
## 2018-12-02 LAB — APTT
aPTT: 33 seconds (ref 24–36)
aPTT: 40 seconds — ABNORMAL HIGH (ref 24–36)

## 2018-12-02 LAB — CBC
HCT: 35.8 % — ABNORMAL LOW (ref 39.0–52.0)
Hemoglobin: 11.8 g/dL — ABNORMAL LOW (ref 13.0–17.0)
MCH: 30.6 pg (ref 26.0–34.0)
MCHC: 33 g/dL (ref 30.0–36.0)
MCV: 93 fL (ref 80.0–100.0)
Platelets: 312 10*3/uL (ref 150–400)
RBC: 3.85 MIL/uL — ABNORMAL LOW (ref 4.22–5.81)
RDW: 13 % (ref 11.5–15.5)
WBC: 21.4 10*3/uL — ABNORMAL HIGH (ref 4.0–10.5)
nRBC: 0 % (ref 0.0–0.2)

## 2018-12-02 LAB — HIV ANTIBODY (ROUTINE TESTING W REFLEX): HIV Screen 4th Generation wRfx: NONREACTIVE

## 2018-12-02 LAB — HEPARIN LEVEL (UNFRACTIONATED)
Heparin Unfractionated: 0.36 IU/mL (ref 0.30–0.70)
Heparin Unfractionated: 0.35 IU/mL (ref 0.30–0.70)

## 2018-12-02 LAB — BASIC METABOLIC PANEL
Anion gap: 10 (ref 5–15)
BUN: 19 mg/dL (ref 8–23)
CO2: 22 mmol/L (ref 22–32)
Calcium: 7.9 mg/dL — ABNORMAL LOW (ref 8.9–10.3)
Chloride: 106 mmol/L (ref 98–111)
Creatinine, Ser: 0.91 mg/dL (ref 0.61–1.24)
GFR calc Af Amer: >60 mL/min (ref >60)
GFR calc non Af Amer: >60 mL/min (ref >60)
Glucose, Bld: 92 mg/dL (ref 70–99)
Potassium: 3.8 mmol/L (ref 3.5–5.1)
Sodium: 138 mmol/L (ref 135–145)

## 2018-12-02 MED ORDER — HEPARIN (PORCINE) 25000 UT/250ML-% IV SOLN
1600.0000 [IU]/h | INTRAVENOUS | Status: DC
  Administered 2018-12-02: 10:00:00 1350 [IU]/h via INTRAVENOUS
  Administered 2018-12-03: 1600 [IU]/h via INTRAVENOUS
  Filled 2018-12-02 (×2): qty 250

## 2018-12-02 MED ORDER — METOPROLOL SUCCINATE ER 25 MG PO TB24
50.0000 mg | ORAL_TABLET | Freq: Every day | ORAL | Status: DC
  Administered 2018-12-02: 10:00:00 50 mg via ORAL
  Filled 2018-12-02: qty 2

## 2018-12-02 MED ORDER — SUCRALFATE 1 GM/10ML PO SUSP
1.0000 g | Freq: Three times a day (TID) | ORAL | Status: DC
  Administered 2018-12-02 (×2): 1 g via ORAL
  Filled 2018-12-02 (×2): qty 10

## 2018-12-02 MED ORDER — METOPROLOL TARTRATE 5 MG/5ML IV SOLN
5.0000 mg | INTRAVENOUS | Status: AC | PRN
  Administered 2018-12-02 – 2018-12-03 (×2): 5 mg via INTRAVENOUS
  Filled 2018-12-02 (×2): qty 5

## 2018-12-02 MED ORDER — DILTIAZEM LOAD VIA INFUSION
20.0000 mg | Freq: Once | INTRAVENOUS | Status: AC
  Administered 2018-12-02: 20:00:00 20 mg via INTRAVENOUS
  Filled 2018-12-02: qty 20

## 2018-12-02 MED ORDER — METOPROLOL TARTRATE 5 MG/5ML IV SOLN
5.0000 mg | Freq: Four times a day (QID) | INTRAVENOUS | Status: DC | PRN
  Administered 2018-12-02 – 2018-12-08 (×5): 5 mg via INTRAVENOUS
  Filled 2018-12-02 (×6): qty 5

## 2018-12-02 MED ORDER — METOPROLOL TARTRATE 5 MG/5ML IV SOLN
INTRAVENOUS | Status: AC
  Administered 2018-12-02: 13:00:00 5 mg
  Filled 2018-12-02: qty 5

## 2018-12-02 MED ORDER — DILTIAZEM HCL ER COATED BEADS 180 MG PO CP24
360.0000 mg | ORAL_CAPSULE | Freq: Every day | ORAL | Status: DC
  Administered 2018-12-02: 360 mg via ORAL
  Filled 2018-12-02: qty 2

## 2018-12-02 MED ORDER — TRAMADOL HCL 50 MG PO TABS
50.0000 mg | ORAL_TABLET | Freq: Three times a day (TID) | ORAL | Status: DC | PRN
  Administered 2018-12-02 – 2018-12-03 (×2): 50 mg via ORAL
  Filled 2018-12-02 (×2): qty 1

## 2018-12-02 MED ORDER — DILTIAZEM HCL 25 MG/5ML IV SOLN: 20.0000 mg | Freq: Once | INTRAVENOUS | Status: DC

## 2018-12-02 MED ORDER — DILTIAZEM HCL 100 MG IV SOLR
5.0000 mg/h | INTRAVENOUS | Status: DC
  Administered 2018-12-02: 20:00:00 15 mg/h via INTRAVENOUS
  Administered 2018-12-02: 12:00:00 5 mg/h via INTRAVENOUS
  Administered 2018-12-03: 13:00:00 10 mg/h via INTRAVENOUS
  Administered 2018-12-03 (×2): 15 mg/h via INTRAVENOUS
  Filled 2018-12-02 (×6): qty 100
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
## 2018-12-02 NOTE — Progress Notes (Signed)
Brief Pharmacy Note re: IV Heparin  68 yo M started on IV heparin without bolus earlier today to bridge while Eliquis on hold.  O:  Heparin level 0.35, aPTT 40 sec on heparin 1350 units/hr       No bleeding or infusion related issues reported by RN  A: aPTT, HL remain at baseline despite heparin @ 15 units/kg/hr  P:  Increase heparin to 1600 units/hr       Repeat 8h HL and aPTT       F/U plans to transition back to [place], PharmD, BCPS 12/02/2018@7 :25 PM

## 2018-12-02 NOTE — Progress Notes (Signed)
Subjective/Chief Complaint: Denies abdominal pain this morning   Objective: Vital signs in last 24 hours: Temp:  [98.4 F (36.9 C)-99.3 F (37.4 C)] 98.8 F (37.1 C) (08/15 0335) Pulse Rate:  [81-112] 91 (08/15 0500) Resp:  [13-27] 13 (08/15 0500) BP: (97-150)/(52-120) 97/72 (08/15 0500) SpO2:  [87 %-100 %] 99 % (08/15 0500) Weight:  [95.7 kg] 95.7 kg (08/15 0500) Last BM Date: 12/02/18  Intake/Output from previous day: 08/14 0701 - 08/15 0700 In: 2231.3 [I.V.:2101.6; IV Piggyback:129.7] Out: 2325 [Urine:2325] Intake/Output this shift: No intake/output data recorded.  Exam: Awake and alert Abdomen soft, non-tender in upper abdomen, no peritonitis   Lab Results:  Recent Labs    12/01/18 0221 12/02/18 0154  WBC 29.4* 21.4*  HGB 11.0* 11.8*  HCT 34.8* 35.8*  PLT 298 312   BMET Recent Labs    12/01/18 0221 12/02/18 0154  NA 132* 138  K 4.2 3.8  CL 100 106  CO2 20* 22  GLUCOSE 99 92  BUN 39* 19  CREATININE 2.15* 0.91  CALCIUM 8.0* 7.9*   PT/INR No results for input(s): LABPROT, INR in the last 72 hours. ABG No results for input(s): PHART, HCO3 in the last 72 hours.  Invalid input(s): PCO2, PO2  Studies/Results: Ct Abdomen Pelvis Wo Contrast  Result Date: 11/30/2018 CLINICAL DATA:  Abdominal distension, intermittent shortness of breath for 2 days, unable to urinate EXAM: CT ABDOMEN AND PELVIS WITHOUT CONTRAST TECHNIQUE: Multidetector CT imaging of the abdomen and pelvis was performed following the standard protocol without IV contrast. COMPARISON:  None. FINDINGS: Lower chest: Trace right pleural effusion. Bandlike areas of scarring and/or atelectasis are present in both bases. Cardiomegaly. Coronary artery calcifications are noted. Trace pericardial fluid likely within physiologic normal. Hepatobiliary: No focal liver abnormality is seen. No gallstones, gallbladder wall thickening, or biliary dilatation. Pancreas: Partial fatty replacement of the  pancreas. Mild stranding near the pancreatic head appears likely reactive to the adjacent process rather than centered upon the pancreas itself. Spleen: Normal in size without focal abnormality. Adrenals/Urinary Tract: Adrenal glands are unremarkable. There is asymmetrically increased right perinephric stranding and dilatation of the right ureter without visualization of a an obstructing ureteral calculus. There is abrupt narrowing of the ureteral caliber as it courses adjacent to the retroperitoneal stranding and inflammation detailed in the stomach/bowel section below. Stomach/Bowel: The distal esophagus is normal. The stomach is unremarkable. There is focal circumferential thickening of the duodenal sweep with extensive adjacent periduodenal stranding and phlegmonous change and likely reactive free fluid in the retroperitoneum, insinuating predominantly through the anterior pararenal space. No intraperitoneal or retroperitoneal extraluminal gas is identified. More distal small bowel is fluid-filled but within otherwise normal appearance. A appendix is present in the right lower quadrant coursing to the right upper quadrant. Limited fecalization of the large bowel contents with air and fluid seen in the distal colon. Vascular/Lymphatic: Limited evaluation the absence of contrast. Atherosclerotic calcification of the aorta and several branch vessels. No aneurysmal dilatation. Scattered reactive appearing lymph nodes are present in the upper abdomen and retroperitoneum. No suspicious or enlarged lymph nodes in the included lymphatic chains. Reproductive: The prostate and seminal vesicles are unremarkable. Other: No abdominal wall hernia or abnormality. No abdominopelvic ascites. Musculoskeletal: Small amount of subcutaneous gas tracking within the fascial planes of the right lower extremity with circumferential soft tissue swelling asymmetrically increased in the right proximal thigh. Multilevel degenerative changes  are present in the imaged portions of the spine. IMPRESSION: 1. Focal circumferential thickening of  the duodenal sweep with extensive adjacent periduodenal stranding and phlegmonous change and likely reactive free fluid in the anterior pararenal space of the retroperitoneum. Findings are concerning for duodenal ulcer with possible perforation, particularly given history of recent surgery. 2. Asymmetric right perinephric stranding with dilatation urothelial thickening of the right ureter without visualization of an obstructing ureteral calculus, which is favored to be reactive partial obstruction secondary to the adjacent pararenal process. 3. Small amount of subcutaneous gas tracking within the fascial planes of the right lower extremity with circumferential soft tissue swelling asymmetrically increased in the right proximal thigh, possibly related to recent lower extremity surgery though should correlate with exam findings and clinically exclude the presence of aggressive soft tissue infection. 4. Aortic Atherosclerosis (ICD10-I70.0). These results were called by telephone at the time of interpretation on 11/30/2018 at 4:57 pm to [facility] PA, who verbally acknowledged these results. Electronically Signed   By: Lovena Le M.D.   On: 11/30/2018 17:00   Dg Chest Portable 1 View  Result Date: 11/30/2018 CLINICAL DATA:  Shortness of breath EXAM: PORTABLE CHEST 1 VIEW COMPARISON:  06/17/2014 FINDINGS: Low lung volumes. Linear atelectasis in the right upper lobe. Cardiomegaly. Possible tiny pleural effusions. No pneumothorax. IMPRESSION: Low lung volumes.  Cardiomegaly with possible tiny effusions. Electronically Signed   By: Donavan Foil M.D.   On: 11/30/2018 16:05   Dg Knee Complete 4 Views Right  Result Date: 11/30/2018 CLINICAL DATA:  Gas on CT, right knee replacement 11/21/2018 EXAM: RIGHT KNEE - COMPLETE 4+ VIEW COMPARISON:  None. FINDINGS: Patient is post total right knee arthroplasty with posterior  patellar resurfacing. There is extensive soft tissue swelling and edema with overlying skin thickening. Large right knee joint effusion is present. Hardware remains appropriately aligned. No periprosthetic fracture is seen. Few radiodensities about the joint line may be postsurgical in nature. Radiolucent screw tracks are noted in the proximal tibia and likely reflect prior hardware removal. IMPRESSION: Extensive swelling and soft tissue edema about the right knee with large joint effusion. Fall findings may be in part postsurgical, recommend correlation with clinical findings to exclude infection. Electronically Signed   By: Lovena Le M.D.   On: 11/30/2018 20:12   Dg Femur 1v Right  Result Date: 11/30/2018 CLINICAL DATA:  Gas on CT, evaluate for further foci EXAM: RIGHT FEMUR 1 VIEW COMPARISON:  CT abdomen pelvis same day FINDINGS: Extensive soft tissue swelling extends from the right hip to the level of the right knee. The right femur is intact. Right femoral head remains normally located. Included portions of the right pelvis are unremarkable. No visible soft tissue gas on this radiographic exam. Patient is post total right knee arthroplasty. IMPRESSION: 1. Extensive soft tissue swelling extending from the right hip to the level of the right knee. No additional foci of subcutaneous gas. This is greater than expected for the postsurgical state. Absence of gas does not necessarily exclude the presence of soft tissue infection which is a clinical diagnosis. Recommend correlation with exam findings and laboratory markers such as LRINEC score. 2. No acute osseous abnormality.  Hardware intact. Electronically Signed   By: Lovena Le M.D.   On: 11/30/2018 20:15    Anti-infectives: Anti-infectives (From admission, onward)   Start     Dose/Rate Route Frequency Ordered Stop   12/01/18 0400  piperacillin-tazobactam (ZOSYN) IVPB 2.25 g  Status:  Discontinued     2.25 g 100 mL/hr over 30 Minutes Intravenous  Every 8 hours 11/30/18 1838 12/01/18 0204  12/01/18 0400  piperacillin-tazobactam (ZOSYN) IVPB 3.375 g     3.375 g 12.5 mL/hr over 240 Minutes Intravenous Every 8 hours 12/01/18 0204     11/30/18 1839  vancomycin variable dose per unstable renal function (pharmacist dosing)  Status:  Discontinued      Does not apply See admin instructions 11/30/18 1839 12/01/18 1257   11/30/18 1715  piperacillin-tazobactam (ZOSYN) IVPB 2.25 g     2.25 g 100 mL/hr over 30 Minutes Intravenous STAT 11/30/18 1708 11/30/18 1955   11/30/18 1715  vancomycin (VANCOCIN) 2,000 mg in sodium chloride 0.9 % 500 mL IVPB     2,000 mg 250 mL/hr over 120 Minutes Intravenous STAT 11/30/18 1711 11/30/18 2030   11/30/18 1700  metroNIDAZOLE (FLAGYL) IVPB 500 mg  Status:  Discontinued     500 mg 100 mL/hr over 60 Minutes Intravenous  Once 11/30/18 1657 11/30/18 1710   11/30/18 1700  vancomycin (VANCOCIN) IVPB 1000 mg/200 mL premix  Status:  Discontinued     1,000 mg 200 mL/hr over 60 Minutes Intravenous  Once 11/30/18 1657 11/30/18 1711      Assessment/Plan: Duodenitis with possible RTP perforation  Continues to improve, no plans at this point for surgical intervention WBC down Cr. Normal  Will start clear liquid diet today  LOS: 2 days    Coralie Keens MD 12/02/2018

## 2018-12-02 NOTE — Progress Notes (Signed)
PROGRESS NOTE    Philip Jackson  OIN:[id number] DOB: September 20, 1950 DOA: 11/30/2018 PCP: Leonides Sake, MD   Brief Narrative: 68 year old with past medical history significant for A. fib on Eliquis, GERD, hypertension, OSA who presented complaining of acute onset progressively getting worse abdominal pain and distention since 2 days prior to admission.  Patient had a recent arthroplasty by Dr. Wynelle Link on 11/20/2018.  Patient has been also complaining of swelling in his right leg since surgery. Patient presented with AKI with a creatinine of 4.2, hypoxemia, A. fib with RVR, signs of sepsis, CT abdomen showed possible duodenitis and perforation.  Surgery was consulted they are recommending conservative management.  Assessment & Plan:   Principal Problem:   AKI (acute kidney injury) ([facility]) Active Problems:   HTN (hypertension)   Atrial fibrillation with RVR ([facility])   HLD (hyperlipidemia)   OSA treated with BiPAP   Duodenitis   Urine retention   Hyperkalemia    1-A fib RVR:  Likely related to acute illness.  Holding eliquis due to duodenitis, monitor for GI bleed and need for surgery.  Discussed with DR Ninfa Linden plan to resume heparin gtt and monitor for sign of bleeding.  On clears, will transition to oral Cardizem and metoprolol.   2-Sepsis; patient presents with leukocytosis, tachycardia, tachypnea.   might be related intra-abdominal process, or knee infection.  Blood culture ordered.  Continue with IV Zosyn.  Now the orthopedic does not have any concern with knee infection will discontinue vancomycin. Decreased IV fluids.  White blood cell trending down today at 21 from 43 on admission.   3-AKI; presents with cr at 4. Could be related to obstructive uropathy, Hypovolemia, ACE, sepsis.  Has foley catheter, yielding 2 L urine.  He will need voiding trial.  Renal function improved. Decreased IV fluids.    4-Duodenitis; ? Perforation contained.   Discussed case with Dr Marcello Moores, she  reviewed CT abdomen, she doesn't see free air. If he has perforation is contain.  Support care and monitor closely.  Appreciate surgery evaluation. Continue with  IV fluids, IV Protonix, IV antibiotics. Per surgery patient might need CT scan early next week. Plan to start clear diet/   5-Acute hypoxic respiratory failure; related to intra-abdominal process, decrease lung compliance.  He report improvement of dyspnea.  Oxygen sat improved on 2 L.  Monitor.   6-Hyperkalemia;  Repeat b-met. He received IV fluids.  Suspect related to AKI.  Resolved.  Hyponatremia; related to hypovolemia. IV fluids.  Improved.  Right knee, recent sx; redness and gas on CT abdomen.  Orthopedic has been consulted.  Appreciate Dr. Wynelle Link evaluation.  No signs of infection.  Constipated.  Had multiples BM   Estimated body mass index is 31.16 kg/m as calculated from the following:   Height as of this encounter: _0  (1.753 m).   Weight as of this encounter: 95.7 kg.   DVT prophylaxis: scd Code Status: full code Family Communication: care discussed with patient  Disposition Plan: remain in the step down, for care of possible duodenal perforation, contain. AKI Consultants:   Surgery   Procedures:   none  Antimicrobials:    Subjective: Denies abdominal pain, abdomen still some what distended.  Denies vomiting. Feeling good after been able to drink gingerail.    Objective: Vitals:   12/02/18 0300 12/02/18 0335 12/02/18 0400 12/02/18 0500  BP: 118/74  104/69 97/72  Pulse: 96  87 91  Resp: _1 Temp:  98.8 F (37.1 C)  TempSrc:  Axillary    SpO2: 99%  95% 99%  Weight:    95.7 kg  Height:        Intake/Output Summary (Last 24 hours) at 12/02/2018 3536 Last data filed at 12/02/2018 0547 Gross per 24 hour  Intake 2231.29 ml  Output 2325 ml  Net -93.71 ml   Filed Weights   11/30/18 2120 12/02/18 0500  Weight: 97.2 kg 95.7 kg    Examination:  General exam:  NAD Respiratory system: CTA Cardiovascular system: S 1, S 2 RRR Gastrointestinal system: BS present, soft, nt, distended. Central nervous system: alert, non focal.  Extremities: Symmetric power.  Skin: No rashes Psychiatry:  Mood and affect appropriate   Data Reviewed: I have personally reviewed following labs and imaging studies  CBC: Recent Labs  Lab 11/30/18 1445 12/01/18 0221 12/02/18 0154  WBC 43.8* 29.4* 21.4*  NEUTROABS 33.7*  --   --   HGB 11.7* 11.0* 11.8*  HCT 35.4* 34.8* 35.8*  MCV 93.2 94.6 93.0  PLT 380 298 144   Basic Metabolic Panel: Recent Labs  Lab 11/30/18 1445 11/30/18 1920 12/01/18 0221 12/02/18 0154  NA 124* 130* 132* 138  K 5.3* 4.6 4.2 3.8  CL 88* 94* 100 106  CO2 22 24 20* 22  GLUCOSE 127* 89 99 92  BUN 55* 49* 39* 19  CREATININE 4.29* 3.16* 2.15* 0.91  CALCIUM 8.5* 8.3* 8.0* 7.9*   GFR: Estimated Creatinine Clearance: 88.7 mL/min (by C-G formula based on SCr of 0.91 mg/dL). Liver Function Tests: Recent Labs  Lab 11/30/18 1445  AST 33  ALT 32  ALKPHOS 58  BILITOT 1.4*  PROT 7.0  ALBUMIN 3.3*   Recent Labs  Lab 11/30/18 1445  LIPASE 26   No results for input(s): AMMONIA in the last 168 hours. Coagulation Profile: No results for input(s): INR, PROTIME in the last 168 hours. Cardiac Enzymes: No results for input(s): CKTOTAL, CKMB, CKMBINDEX, TROPONINI in the last 168 hours. BNP (last 3 results) No results for input(s): PROBNP in the last 8760 hours. HbA1C: No results for input(s): HGBA1C in the last 72 hours. CBG: No results for input(s): GLUCAP in the last 168 hours. Lipid Profile: No results for input(s): CHOL, HDL, LDLCALC, TRIG, CHOLHDL, LDLDIRECT in the last 72 hours. Thyroid Function Tests: No results for input(s): TSH, T4TOTAL, FREET4, T3FREE, THYROIDAB in the last 72 hours. Anemia Panel: No results for input(s): VITAMINB12, FOLATE, FERRITIN, TIBC, IRON, RETICCTPCT in the last 72 hours. Sepsis Labs: Recent Labs   Lab 11/30/18 1622  LATICACIDVEN 1.5    Recent Results (from the past 240 hour(s))  Blood culture (routine x 2)     Status: None (Preliminary result)   Collection Time: 11/30/18  4:12 PM   Specimen: BLOOD  Result Value Ref Range Status   Specimen Description   Final    BLOOD RIGHT ANTECUBITAL Performed at [facility] [address], [place], [place] [address]    Special Requests   Final    BOTTLES DRAWN AEROBIC ONLY Blood Culture adequate volume Performed at [facility] [address] [place], [place] [address]    Culture   Final    NO GROWTH < 24 HOURS Performed at [facility] [address], [place], [place] [address]    Report Status PENDING  Incomplete  Blood culture (routine x 2)     Status: None (Preliminary result)   Collection Time: 11/30/18  4:12 PM   Specimen: BLOOD  Result Value Ref Range Status   Specimen Description   Final    BLOOD LEFT ANTECUBITAL Performed at [facility] [address], [place], [place] [address]    Special Requests   Final    BOTTLES DRAWN AEROBIC ONLY Blood Culture adequate volume Performed at [facility] [address], [place], [place] [address]    Culture   Final    NO GROWTH < 24 HOURS Performed at [facility] [address], [place], [place] [address]    Report Status PENDING  Incomplete  SARS Coronavirus 2 [facility] order, Performed in [facility] hospital lab) Nasopharyngeal Nasopharyngeal Swab     Status: None   Collection Time: 11/30/18  5:54 PM   Specimen: Nasopharyngeal Swab  Result Value Ref Range Status   SARS Coronavirus 2 NEGATIVE NEGATIVE Final    Comment: (NOTE) If result is NEGATIVE SARS-CoV-2 target nucleic acids are NOT DETECTED. The SARS-CoV-2 RNA is generally detectable in upper and lower  respiratory specimens during the acute phase of infection. The lowest  concentration of SARS-CoV-2 viral copies this assay  can detect is 250  copies / mL. A negative result does not preclude SARS-CoV-2 infection  and should not be used as the sole basis for treatment or other  patient management decisions.  A negative result may occur with  improper specimen collection / handling, submission of specimen other  than nasopharyngeal swab, presence of viral mutation(s) within the  areas targeted by this assay, and inadequate number of viral copies  (<250 copies / mL). A negative result must be combined with clinical  observations, patient history, and epidemiological information. If result is POSITIVE SARS-CoV-2 target nucleic acids are DETECTED. The SARS-CoV-2 RNA is generally detectable in upper and lower  respiratory specimens dur ing the acute phase of infection.  Positive  results are indicative of active infection with SARS-CoV-2.  Clinical  correlation with patient history and other diagnostic information is  necessary to determine patient infection status.  Positive results do  not rule out bacterial infection or co-infection with other viruses. If result is PRESUMPTIVE POSTIVE SARS-CoV-2 nucleic acids MAY BE PRESENT.   A presumptive positive result was obtained on the submitted specimen  and confirmed on repeat testing.  While 2019 novel coronavirus  (SARS-CoV-2) nucleic acids may be present in the submitted sample  additional confirmatory testing may be necessary for epidemiological  and / or clinical management purposes  to differentiate between  SARS-CoV-2 and other Sarbecovirus currently known to infect humans.  If clinically indicated additional testing with an alternate test  methodology [phone]) is advised. The SARS-CoV-2 RNA is generally  detectable in upper and lower respiratory sp ecimens during the acute  phase of infection. The expected result is Negative. Fact Sheet for Patients:  StrictlyIdeas.no Fact Sheet for Healthcare  Providers: BankingDealers.co.za This test is not yet approved or cleared by the [place] FDA and has been authorized for detection and/or diagnosis of SARS-CoV-2 by FDA under an Emergency Use Authorization (EUA).  This EUA will remain in effect (meaning this test can be used) for the duration of the COVID-19 declaration under Section 564(b)(1) of the Act, 21 U.S.C. section 360bbb-3(b)(1), unless the authorization is terminated or revoked sooner. Performed at [facility], [place] [address], [place], [place] [address]          Radiology Studies: Ct Abdomen Pelvis Wo Contrast  Result Date: 11/30/2018 CLINICAL DATA:  Abdominal distension, intermittent shortness of  breath for 2 days, unable to urinate EXAM: CT ABDOMEN AND PELVIS WITHOUT CONTRAST TECHNIQUE: Multidetector CT imaging of the abdomen and pelvis was performed following the standard protocol without IV contrast. COMPARISON:  None. FINDINGS: Lower chest: Trace right pleural effusion. Bandlike areas of scarring and/or atelectasis are present in both bases. Cardiomegaly. Coronary artery calcifications are noted. Trace pericardial fluid likely within physiologic normal. Hepatobiliary: No focal liver abnormality is seen. No gallstones, gallbladder wall thickening, or biliary dilatation. Pancreas: Partial fatty replacement of the pancreas. Mild stranding near the pancreatic head appears likely reactive to the adjacent process rather than centered upon the pancreas itself. Spleen: Normal in size without focal abnormality. Adrenals/Urinary Tract: Adrenal glands are unremarkable. There is asymmetrically increased right perinephric stranding and dilatation of the right ureter without visualization of a an obstructing ureteral calculus. There is abrupt narrowing of the ureteral caliber as it courses adjacent to the retroperitoneal stranding and inflammation detailed in the stomach/bowel section below.  Stomach/Bowel: The distal esophagus is normal. The stomach is unremarkable. There is focal circumferential thickening of the duodenal sweep with extensive adjacent periduodenal stranding and phlegmonous change and likely reactive free fluid in the retroperitoneum, insinuating predominantly through the anterior pararenal space. No intraperitoneal or retroperitoneal extraluminal gas is identified. More distal small bowel is fluid-filled but within otherwise normal appearance. A appendix is present in the right lower quadrant coursing to the right upper quadrant. Limited fecalization of the large bowel contents with air and fluid seen in the distal colon. Vascular/Lymphatic: Limited evaluation the absence of contrast. Atherosclerotic calcification of the aorta and several branch vessels. No aneurysmal dilatation. Scattered reactive appearing lymph nodes are present in the upper abdomen and retroperitoneum. No suspicious or enlarged lymph nodes in the included lymphatic chains. Reproductive: The prostate and seminal vesicles are unremarkable. Other: No abdominal wall hernia or abnormality. No abdominopelvic ascites. Musculoskeletal: Small amount of subcutaneous gas tracking within the fascial planes of the right lower extremity with circumferential soft tissue swelling asymmetrically increased in the right proximal thigh. Multilevel degenerative changes are present in the imaged portions of the spine. IMPRESSION: 1. Focal circumferential thickening of the duodenal sweep with extensive adjacent periduodenal stranding and phlegmonous change and likely reactive free fluid in the anterior pararenal space of the retroperitoneum. Findings are concerning for duodenal ulcer with possible perforation, particularly given history of recent surgery. 2. Asymmetric right perinephric stranding with dilatation urothelial thickening of the right ureter without visualization of an obstructing ureteral calculus, which is favored to be  reactive partial obstruction secondary to the adjacent pararenal process. 3. Small amount of subcutaneous gas tracking within the fascial planes of the right lower extremity with circumferential soft tissue swelling asymmetrically increased in the right proximal thigh, possibly related to recent lower extremity surgery though should correlate with exam findings and clinically exclude the presence of aggressive soft tissue infection. 4. Aortic Atherosclerosis (ICD10-I70.0). These results were called by telephone at the time of interpretation on 11/30/2018 at 4:57 pm to [facility], who verbally acknowledged these results. Electronically Signed   By: Lovena Le M.D.   On: 11/30/2018 17:00   Dg Chest Portable 1 View  Result Date: 11/30/2018 CLINICAL DATA:  Shortness of breath EXAM: PORTABLE CHEST 1 VIEW COMPARISON:  06/17/2014 FINDINGS: Low lung volumes. Linear atelectasis in the right upper lobe. Cardiomegaly. Possible tiny pleural effusions. No pneumothorax. IMPRESSION: Low lung volumes.  Cardiomegaly with possible tiny effusions. Electronically Signed   By: Donavan Foil M.D.   On: 11/30/2018  16:05   Dg Knee Complete 4 Views Right  Result Date: 11/30/2018 CLINICAL DATA:  Gas on CT, right knee replacement 11/21/2018 EXAM: RIGHT KNEE - COMPLETE 4+ VIEW COMPARISON:  None. 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 There is extensive soft tissue swelling and edema with overlying skin thickening. Large right knee joint effusion is present. Hardware remains appropriately aligned. No periprosthetic fracture is seen. Few radiodensities about the joint line may be postsurgical in nature. Radiolucent screw tracks are noted in the proximal tibia and likely reflect prior hardware removal. IMPRESSION: Extensive swelling and soft tissue edema about the right knee with large joint effusion. Fall findings may be in part postsurgical, recommend correlation with clinical  findings to exclude infection. Electronically Signed   By: Lovena Le M.D.   On: 11/30/2018 20:12   Dg Femur 1v Right  Result Date: 11/30/2018 CLINICAL DATA:  Gas on CT, evaluate for further foci EXAM: RIGHT FEMUR 1 VIEW COMPARISON:  CT abdomen pelvis same day FINDINGS: Extensive soft tissue swelling extends from the right hip to the level of the right knee. The right femur is intact. Right femoral head remains normally located. Included portions of the right pelvis are unremarkable. No visible soft tissue gas on this radiographic exam. Patient is post total right knee arthroplasty. IMPRESSION: 1. Extensive soft tissue swelling extending from the right hip to the level of the right knee. No additional foci of subcutaneous gas. This is greater than expected for the postsurgical state. Absence of gas does not necessarily exclude the presence of soft tissue infection which is a clinical diagnosis. Recommend correlation with exam findings and laboratory markers such as LRINEC score. 2. No acute osseous abnormality.  Hardware intact. Electronically Signed   By: Lovena Le M.D.   On: 11/30/2018 20:15        Scheduled Meds:  Chlorhexidine Gluconate Cloth  6 each Topical Daily   mouth rinse  15 mL Mouth Rinse BID   metoprolol tartrate  2.5 mg Intravenous Q8H   pantoprazole (PROTONIX) IV  40 mg Intravenous Q12H   sodium chloride flush  3 mL Intravenous Once   Continuous Infusions:  sodium chloride 100 mL/hr at 12/02/18 0325   sodium chloride 10 mL/hr at 12/02/18 0325   diltiazem (CARDIZEM) infusion 15 mg/hr (12/02/18 0416)   piperacillin-tazobactam (ZOSYN)  IV 3.375 g (12/02/18 0536)     LOS: 2 days    Time spent: 35 minutes.     Elmarie Shiley, MD [facility] Pager [phone]  If 7PM-7AM, please contact night-coverage [email or web address] Password [email or web address] 12/02/2018, 7:12 AM

## 2018-12-03 ENCOUNTER — Inpatient Hospital Stay (HOSPITAL_COMMUNITY): Payer: BC Managed Care – PPO

## 2018-12-03 LAB — URINALYSIS, ROUTINE W REFLEX MICROSCOPIC
Bacteria, UA: NONE SEEN
Bilirubin Urine: NEGATIVE
Glucose, UA: NEGATIVE mg/dL
Ketones, ur: 5 mg/dL — AB
Leukocytes,Ua: NEGATIVE
Nitrite: NEGATIVE
Protein, ur: 30 mg/dL — AB
RBC / HPF: >50 RBC/hpf — ABNORMAL HIGH (ref 0–5)
Specific Gravity, Urine: >1.046 — ABNORMAL HIGH (ref 1.005–1.030)
pH: 5 (ref 5.0–8.0)

## 2018-12-03 LAB — HEPATIC FUNCTION PANEL
ALT: 48 U/L — ABNORMAL HIGH (ref 0–44)
AST: 30 U/L (ref 15–41)
Albumin: 2.6 g/dL — ABNORMAL LOW (ref 3.5–5.0)
Alkaline Phosphatase: 53 U/L (ref 38–126)
Bilirubin, Direct: 0.4 mg/dL — ABNORMAL HIGH (ref 0.0–0.2)
Indirect Bilirubin: 1 mg/dL — ABNORMAL HIGH (ref 0.3–0.9)
Total Bilirubin: 1.4 mg/dL — ABNORMAL HIGH (ref 0.3–1.2)
Total Protein: 6.3 g/dL — ABNORMAL LOW (ref 6.5–8.1)

## 2018-12-03 LAB — BASIC METABOLIC PANEL
Anion gap: 13 (ref 5–15)
BUN: 16 mg/dL (ref 8–23)
CO2: 21 mmol/L — ABNORMAL LOW (ref 22–32)
Calcium: 8.1 mg/dL — ABNORMAL LOW (ref 8.9–10.3)
Chloride: 103 mmol/L (ref 98–111)
Creatinine, Ser: 0.91 mg/dL (ref 0.61–1.24)
GFR calc Af Amer: >60 mL/min (ref >60)
GFR calc non Af Amer: >60 mL/min (ref >60)
Glucose, Bld: 142 mg/dL — ABNORMAL HIGH (ref 70–99)
Potassium: 3.1 mmol/L — ABNORMAL LOW (ref 3.5–5.1)
Sodium: 137 mmol/L (ref 135–145)

## 2018-12-03 LAB — CBC
HCT: 36.3 % — ABNORMAL LOW (ref 39.0–52.0)
Hemoglobin: 11.7 g/dL — ABNORMAL LOW (ref 13.0–17.0)
MCH: 29.8 pg (ref 26.0–34.0)
MCHC: 32.2 g/dL (ref 30.0–36.0)
MCV: 92.6 fL (ref 80.0–100.0)
Platelets: 500 10*3/uL — ABNORMAL HIGH (ref 150–400)
RBC: 3.92 MIL/uL — ABNORMAL LOW (ref 4.22–5.81)
RDW: 12.6 % (ref 11.5–15.5)
WBC: 24.2 10*3/uL — ABNORMAL HIGH (ref 4.0–10.5)
nRBC: 0 % (ref 0.0–0.2)

## 2018-12-03 LAB — APTT
aPTT: 57 seconds — ABNORMAL HIGH (ref 24–36)
aPTT: 76 seconds — ABNORMAL HIGH (ref 24–36)

## 2018-12-03 LAB — C DIFFICILE QUICK SCREEN W PCR REFLEX
C Diff antigen: NEGATIVE
C Diff interpretation: NOT DETECTED
C Diff toxin: NEGATIVE

## 2018-12-03 LAB — LACTIC ACID, PLASMA
Lactic Acid, Venous: 0.9 mmol/L (ref 0.5–1.9)
Lactic Acid, Venous: 1 mmol/L (ref 0.5–1.9)

## 2018-12-03 LAB — HEPARIN LEVEL (UNFRACTIONATED)
Heparin Unfractionated: 0.45 IU/mL (ref 0.30–0.70)
Heparin Unfractionated: 0.35 IU/mL (ref 0.30–0.70)

## 2018-12-03 MED ORDER — PHENOL 1.4 % MT LIQD
2.0000 | OROMUCOSAL | Status: DC | PRN
  Filled 2018-12-03: qty 177

## 2018-12-03 MED ORDER — POTASSIUM CHLORIDE 10 MEQ/100ML IV SOLN
10.0000 meq | INTRAVENOUS | Status: AC
  Administered 2018-12-03 (×4): 10 meq via INTRAVENOUS
  Filled 2018-12-03 (×3): qty 100

## 2018-12-03 MED ORDER — SODIUM CHLORIDE 0.9 % IV BOLUS: 500.0000 mL | Freq: Once | INTRAVENOUS | Status: DC

## 2018-12-03 MED ORDER — METOPROLOL TARTRATE 5 MG/5ML IV SOLN
2.5000 mg | Freq: Three times a day (TID) | INTRAVENOUS | Status: DC
  Administered 2018-12-03 – 2018-12-06 (×9): 2.5 mg via INTRAVENOUS
  Filled 2018-12-03 (×8): qty 5

## 2018-12-03 MED ORDER — BISACODYL 10 MG RE SUPP
10.0000 mg | Freq: Every day | RECTAL | Status: DC
  Administered 2018-12-04: 09:00:00 10 mg via RECTAL
  Filled 2018-12-03 (×2): qty 1

## 2018-12-03 MED ORDER — METOPROLOL TARTRATE 5 MG/5ML IV SOLN
5.0000 mg | Freq: Three times a day (TID) | INTRAVENOUS | Status: DC
  Filled 2018-12-03: qty 5

## 2018-12-03 MED ORDER — IOHEXOL 300 MG/ML  SOLN
100.0000 mL | Freq: Once | INTRAMUSCULAR | Status: AC | PRN
  Administered 2018-12-03: 100 mL via INTRAVENOUS

## 2018-12-03 MED ORDER — LACTATED RINGERS IV BOLUS: 1000.0000 mL | Freq: Three times a day (TID) | INTRAVENOUS | Status: DC | PRN

## 2018-12-03 MED ORDER — METHOCARBAMOL 1000 MG/10ML IJ SOLN
1000.0000 mg | Freq: Four times a day (QID) | INTRAVENOUS | Status: DC | PRN
  Filled 2018-12-03: qty 10

## 2018-12-03 MED ORDER — HEPARIN (PORCINE) 25000 UT/250ML-% IV SOLN
1700.0000 [IU]/h | INTRAVENOUS | Status: DC
  Administered 2018-12-03 – 2018-12-04 (×2): 1700 [IU]/h via INTRAVENOUS
  Filled 2018-12-03: qty 250

## 2018-12-03 MED ORDER — HEPARIN (PORCINE) 25000 UT/250ML-% IV SOLN: 1700.0000 [IU]/h | INTRAVENOUS | Status: DC

## 2018-12-03 MED ORDER — METOCLOPRAMIDE HCL 5 MG/ML IJ SOLN
5.0000 mg | Freq: Three times a day (TID) | INTRAMUSCULAR | Status: DC | PRN
  Administered 2018-12-03 – 2018-12-04 (×3): 5 mg via INTRAVENOUS
  Filled 2018-12-03 (×3): qty 2

## 2018-12-03 MED ORDER — POTASSIUM CHLORIDE 2 MEQ/ML IV SOLN: INTRAVENOUS | Status: DC

## 2018-12-03 MED ORDER — MAGIC MOUTHWASH
15.0000 mL | Freq: Four times a day (QID) | ORAL | Status: DC | PRN
  Filled 2018-12-03: qty 15

## 2018-12-03 MED ORDER — KCL IN DEXTROSE-NACL 20-5-0.45 MEQ/L-%-% IV SOLN
INTRAVENOUS | Status: DC
  Administered 2018-12-03 – 2018-12-06 (×8): via INTRAVENOUS
  Filled 2018-12-03 (×6): qty 1000

## 2018-12-03 MED ORDER — LIP MEDEX EX OINT
1.0000 "application " | TOPICAL_OINTMENT | Freq: Two times a day (BID) | CUTANEOUS | Status: DC
  Administered 2018-12-03 – 2018-12-11 (×14): 1 via TOPICAL
  Filled 2018-12-03: qty 7

## 2018-12-03 MED ORDER — MENTHOL 3 MG MT LOZG
1.0000 | LOZENGE | OROMUCOSAL | Status: DC | PRN
  Filled 2018-12-03: qty 9

## 2018-12-03 NOTE — Progress Notes (Addendum)
PROGRESS NOTE    Philip Jackson  ZOX:[id number] DOB: 01-Feb-1951 DOA: 11/30/2018 PCP: Leonides Sake, MD   Brief Narrative: 68 year old with past medical history significant for A. fib on Eliquis, GERD, hypertension, OSA who presented complaining of acute onset progressively getting worse abdominal pain and distention since 2 days prior to admission.  Patient had a recent arthroplasty by Dr. Wynelle Link on 11/20/2018.  Patient has been also complaining of swelling in his right leg since surgery. Patient presented with AKI with a creatinine of 4.2, hypoxemia, A. fib with RVR, signs of sepsis, CT abdomen showed possible duodenitis and perforation.  Surgery was consulted they are recommending conservative management.  Assessment & Plan:   Principal Problem:   AKI (acute kidney injury) ([facility]) Active Problems:   HTN (hypertension)   Atrial fibrillation with RVR ([facility])   HLD (hyperlipidemia)   OSA treated with BiPAP   Duodenitis   Urine retention   Hyperkalemia    1-A fib RVR:  Likely related to acute illness.  Holding eliquis due to duodenitis, monitor for GI bleed and need for surgery.  Discussed with Dr Ninfa Linden plan to resume heparin gtt and monitor for sign of bleeding.  Back on cardizem Gtt, IV metoprolol.  Will hold heparin for now.   2-Sepsis; patient presents with leukocytosis, tachycardia, tachypnea.   might be related intra-abdominal process, or knee infection.  Blood culture ordered.  Continue with IV Zosyn.  Now the orthopedic does not have any concern with knee infection will discontinue vancomycin. Decreased IV fluids.  WBC on admission 43..  WBC increase to 24 today.   3-AKI; presents with cr at 4. Could be related to obstructive uropathy, Hypovolemia, ACE, sepsis.  Has foley catheter, yielding 2 L urine.  He will need voiding trial.  Renal function improved. Decreased IV fluids.  CT abdomen showed Hydronephrosis, ? Bladder mass vs blood product. . Urology has been  consulted.  Repeat UA. Urine culture.   4-Duodenitis; ? Perforation contained.   Discussed case with Dr Marcello Moores, she reviewed CT abdomen, she doesn't see free air. If he has perforation is contain.  Support care and monitor closely.  Appreciate surgery evaluation. Continue with  IV fluids, IV Protonix, IV antibiotics. Patient with worsening abdominal pain, distension.  Dr Ninfa Linden recommending CT abdomen with contrast.  NPO status again. Hold heparin.    5-Acute hypoxic respiratory failure; related to intra-abdominal process, decrease lung compliance.  He report improvement of dyspnea.  Oxygen sat improved on 2 L.  Monitor.   6-Hyperkalemia;  Repeat b-met. He received IV fluids.  Suspect related to AKI.  Resolved.  7-Hypokalemia;  Replete IV.   8-Diarrhea; Had Multiples BM.  C diff negative.   9-Hyponatremia; related to hypovolemia. IV fluids.  Improved.  10-Right knee, recent sx; redness and gas on CT abdomen.  Orthopedic has been consulted.  Appreciate Dr. Wynelle Link evaluation.  No signs of infection.  Constipated.  Had multiples BM   Estimated body mass index is 31.16 kg/m as calculated from the following:   Height as of this encounter: '5\' 9"'$  (1.753 m).   Weight as of this encounter: 95.7 kg.   DVT prophylaxis: scd Code Status: full code Family Communication: care discussed with patient  Disposition Plan: remain in the step down, for care of possible duodenal perforation, contain. AKI Consultants:   Surgery   Procedures:   none  Antimicrobials:    Subjective: Patient not feeling well this am.  He is complaining of abdominal distension and pain.  Objective: Vitals:   12/03/18 0315 12/03/18 0400 12/03/18 0445 12/03/18 0500  BP:  (!) 148/107  (!) 167/121  Pulse:  (!) 111 (!) 125 (!) 111  Resp:  20 (!) 28 (!) 21  Temp: 98.2 F (36.8 C)     TempSrc: Oral     SpO2:  97% 96% 96%  Weight:      Height:        Intake/Output Summary (Last 24  hours) at 12/03/2018 0700 Last data filed at 12/03/2018 0546 Gross per 24 hour  Intake 900.62 ml  Output 1476 ml  Net -575.38 ml   Filed Weights   11/30/18 2120 12/02/18 0500  Weight: 97.2 kg 95.7 kg    Examination:  General exam: NAD Respiratory system: CTA Cardiovascular system: S 1, S 2 IRR Gastrointestinal system: BS present, more distended, tender.  Central nervous system: Alert, following command Extremities: Symmetric power.  Skin: No rashes Psychiatry; Mood and affect appropriate/   Data Reviewed: I have personally reviewed following labs and imaging studies  CBC: Recent Labs  Lab 11/30/18 1445 12/01/18 0221 12/02/18 0154 12/03/18 0435  WBC 43.8* 29.4* 21.4* 24.2*  NEUTROABS 33.7*  --   --   --   HGB 11.7* 11.0* 11.8* 11.7*  HCT 35.4* 34.8* 35.8* 36.3*  MCV 93.2 94.6 93.0 92.6  PLT 380 298 312 627*   Basic Metabolic Panel: Recent Labs  Lab 11/30/18 1445 11/30/18 1920 12/01/18 0221 12/02/18 0154 12/03/18 0435  NA 124* 130* 132* 138 137  K 5.3* 4.6 4.2 3.8 3.1*  CL 88* 94* 100 106 103  CO2 22 24 20* 22 21*  GLUCOSE 127* 89 99 92 142*  BUN 55* 49* 39* 19 16  CREATININE 4.29* 3.16* 2.15* 0.91 0.91  CALCIUM 8.5* 8.3* 8.0* 7.9* 8.1*   GFR: Estimated Creatinine Clearance: 88.7 mL/min (by C-G formula based on SCr of 0.91 mg/dL). Liver Function Tests: Recent Labs  Lab 11/30/18 1445 12/03/18 0435  AST 33 30  ALT 32 48*  ALKPHOS 58 53  BILITOT 1.4* 1.4*  PROT 7.0 6.3*  ALBUMIN 3.3* 2.6*   Recent Labs  Lab 11/30/18 1445  LIPASE 26   No results for input(s): AMMONIA in the last 168 hours. Coagulation Profile: No results for input(s): INR, PROTIME in the last 168 hours. Cardiac Enzymes: No results for input(s): CKTOTAL, CKMB, CKMBINDEX, TROPONINI in the last 168 hours. BNP (last 3 results) No results for input(s): PROBNP in the last 8760 hours. HbA1C: No results for input(s): HGBA1C in the last 72 hours. CBG: No results for input(s): GLUCAP  in the last 168 hours. Lipid Profile: No results for input(s): CHOL, HDL, LDLCALC, TRIG, CHOLHDL, LDLDIRECT in the last 72 hours. Thyroid Function Tests: No results for input(s): TSH, T4TOTAL, FREET4, T3FREE, THYROIDAB in the last 72 hours. Anemia Panel: No results for input(s): VITAMINB12, FOLATE, FERRITIN, TIBC, IRON, RETICCTPCT in the last 72 hours. Sepsis Labs: Recent Labs  Lab 11/30/18 1622  LATICACIDVEN 1.5    Recent Results (from the past 240 hour(s))  Blood culture (routine x 2)     Status: None (Preliminary result)   Collection Time: 11/30/18  4:12 PM   Specimen: BLOOD  Result Value Ref Range Status   Specimen Description   Final    BLOOD RIGHT ANTECUBITAL Performed at [facility] [address] [place], [place] [address]    Special Requests   Final    BOTTLES DRAWN AEROBIC ONLY Blood Culture adequate volume Performed at [facility]  [place] [address], [place], [place] [address]    Culture   Final    NO GROWTH 2 DAYS Performed at [facility] [address], [place], [place] [address]    Report Status PENDING  Incomplete  Blood culture (routine x 2)     Status: None (Preliminary result)   Collection Time: 11/30/18  4:12 PM   Specimen: BLOOD  Result Value Ref Range Status   Specimen Description   Final    BLOOD LEFT ANTECUBITAL Performed at [facility] [address], [place], [place] [address]    Special Requests   Final    BOTTLES DRAWN AEROBIC ONLY Blood Culture adequate volume Performed at [facility] [address] [place], [place] [address]    Culture   Final    NO GROWTH 2 DAYS Performed at [facility] [address], [place], [place] [address]    Report Status PENDING  Incomplete  SARS Coronavirus 2 [facility] order, Performed in [facility] hospital lab) Nasopharyngeal Nasopharyngeal Swab     Status: None   Collection Time: 11/30/18  5:54 PM   Specimen:  Nasopharyngeal Swab  Result Value Ref Range Status   SARS Coronavirus 2 NEGATIVE NEGATIVE Final    Comment: (NOTE) If result is NEGATIVE SARS-CoV-2 target nucleic acids are NOT DETECTED. The SARS-CoV-2 RNA is generally detectable in upper and lower  respiratory specimens during the acute phase of infection. The lowest  concentration of SARS-CoV-2 viral copies this assay can detect is 250  copies / mL. A negative result does not preclude SARS-CoV-2 infection  and should not be used as the sole basis for treatment or other  patient management decisions.  A negative result may occur with  improper specimen collection / handling, submission of specimen other  than nasopharyngeal swab, presence of viral mutation(s) within the  areas targeted by this assay, and inadequate number of viral copies  (<250 copies / mL). A negative result must be combined with clinical  observations, patient history, and epidemiological information. If result is POSITIVE SARS-CoV-2 target nucleic acids are DETECTED. The SARS-CoV-2 RNA is generally detectable in upper and lower  respiratory specimens dur ing the acute phase of infection.  Positive  results are indicative of active infection with SARS-CoV-2.  Clinical  correlation with patient history and other diagnostic information is  necessary to determine patient infection status.  Positive results do  not rule out bacterial infection or co-infection with other viruses. If result is PRESUMPTIVE POSTIVE SARS-CoV-2 nucleic acids MAY BE PRESENT.   A presumptive positive result was obtained on the submitted specimen  and confirmed on repeat testing.  While 2019 novel coronavirus  (SARS-CoV-2) nucleic acids may be present in the submitted sample  additional confirmatory testing may be necessary for epidemiological  and / or clinical management purposes  to differentiate between  SARS-CoV-2 and other Sarbecovirus currently known to infect humans.  If clinically  indicated additional testing with an alternate test  methodology [phone]) is advised. The SARS-CoV-2 RNA is generally  detectable in upper and lower respiratory sp ecimens during the acute  phase of infection. The expected result is Negative. Fact Sheet for Patients:  StrictlyIdeas.no Fact Sheet for Healthcare Providers: BankingDealers.co.za This test is not yet approved or cleared by the [place] FDA and has been authorized for detection and/or diagnosis of SARS-CoV-2 by FDA under an Emergency Use Authorization (EUA).  This EUA will remain in effect (meaning  this test can be used) for the duration of the COVID-19 declaration under Section 564(b)(1) of the Act, 21 U.S.C. section 360bbb-3(b)(1), unless the authorization is terminated or revoked sooner. Performed at [facility], [place] [address] [place], [place] [address]          Radiology Studies: No results found.      Scheduled Meds: . Chlorhexidine Gluconate Cloth  6 each Topical Daily  . mouth rinse  15 mL Mouth Rinse BID  . pantoprazole (PROTONIX) IV  40 mg Intravenous Q12H  . sucralfate  1 g Oral TID WC & HS   Continuous Infusions: . sodium chloride 40 mL/hr at 12/03/18 0456  . sodium chloride Stopped (12/02/18 0536)  . diltiazem (CARDIZEM) infusion 15 mg/hr (12/03/18 0643)  . heparin 1,700 Units/hr (12/03/18 3014)  . piperacillin-tazobactam (ZOSYN)  IV 3.375 g (12/03/18 0454)  . potassium chloride    . sodium chloride       LOS: 3 days    Time spent: 35 minutes.     Elmarie Shiley, MD [facility] Pager [phone]  If 7PM-7AM, please contact night-coverage [email or web address] Password [email or web address] 12/03/2018, 7:00 AM

## 2018-12-03 NOTE — Progress Notes (Signed)
[place] for heparin Indication: atrial fibrillation - bridge therapy while eliquis on hold  Allergies  Allergen Reactions  . 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 Nsaids     Duodenal ulcer.  anticoagulated    Patient Measurements: Height: 5\' 9"  (175.3 cm) Weight: 210 lb 15.7 oz (95.7 kg) IBW/kg (Calculated) : 70.7 Heparin Dosing Weight: 91 kg  Vital Signs: Temp: 97.6 F (36.4 C) (08/16 0719) Temp Source: Axillary (08/16 0719) BP: 137/86 (08/16 1200) Pulse Rate: 95 (08/16 1200)  Labs: Recent Labs    12/01/18 0221 12/02/18 0154 12/02/18 0937 12/02/18 1809 12/03/18 0435  HGB 11.0* 11.8*  --   --  11.7*  HCT 34.8* 35.8*  --   --  36.3*  PLT 298 312  --   --  500*  APTT  --   --  33 40* 57*  HEPARINUNFRC  --   --  0.36 0.35 0.45  CREATININE 2.15* 0.91  --   --  0.91    Estimated Creatinine Clearance: 88.7 mL/min (by C-G formula based on SCr of 0.91 mg/dL).   Assessment: 68 yo M on Eliquis PTA for Afib.  Admitted with duodenitis and possible RTP perforation.  Pharmacy consulted to start heparin bridge therapy while Eliquis on hold.   PTA Eliquis 5 mg po bid, last dose 8/13 at 07 am. Pt in AKI on admission that has now resolved.  Hg 11.8, PLTC 312 - stable, no bleeding reported.  12/03/2018 Heparin drip was turned off this morning at 0810 AM by CCS for possible surgical intervention Heparin drip ordered to resume now His AM APTT was below goal at 57 sec, his baseline HL was elevated due to PTA Eliquis. Hg stable, PLTC elevated at 500, no bleeding reported  Goal of Therapy:  Heparin level 0.3-0.7 units/ml aPTT 66-102 seconds Monitor platelets by anticoagulation protocol: Yes   Plan:  resume heparin drip at 17000 units/hr 8 hr heparin level and APTT Daily HL, CBC  Eudelia Bunch, Pharm.D [phone] 12/03/2018 12:39 PM
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
## 2018-12-03 NOTE — Progress Notes (Addendum)
Brief Pharmacy Note - Anticoagulation Follow Up:  68 yo M started on IV heparin without bolus earlier today to bridge while Eliquis on hold.  Dosing/monitoring using aPTT currently as baseline HL was elevated. Once aPTT and HL correlate, can transition to monitoring using HL.  Assessment:  APTT = 57 seconds is slightly subtherapeutic on heparin infusion of 1600 units/hr.   Hgb 11.7 - low but stable  Plt - 500  Confirmed with RN that heparin infusing at correct rate with no issues/interruptions. No signs/symptoms of bruising or bleeding  Goal: APTT 66-102 seconds HL 0.3-0.7   Plan:  Increase heparin to 1700 units/hr  Check 6 hour aPTT  HL and CBC daily  Follow for plan to transition back to [place], PharmD 12/03/18 6:39 AM

## 2018-12-03 NOTE — Progress Notes (Signed)
Pt. Refused CPAP at this time.  Will be available if needed.
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
## 2018-12-03 NOTE — Progress Notes (Signed)
Pharmacy Brief Note - Anticoagulation Follow Up:  Patient currently on IV heparin for anticoagulation bridge therapy while apixaban is on hold. Pharmacy consulted to dose/monitor heparin for afib with no bolus.   Have been monitoring aPTT as baseline HL was elevated.  Assessment:  HL = 0.35 and aPTT = 76 seconds on heparin infusion of 1700 units/hr  HL and aPTT correlate so can start monitoring using HL only  Confirmed with RN that heparin infusing at correct rate. No interruptions since it was resumed this afternoon. No signs/symptoms of bleeding or bruising.    Goal:  Heparin level 0.3 - 0.7 APTT 66 - 102 seconds  Plan:  Continue heparin infusion at current rate of 1700 units/hr  Check confirmatory HL in 6 hours  CBC and HL daily   Lenis Noon, PharmD 12/03/18 10:42 PM

## 2018-12-03 NOTE — Progress Notes (Signed)
   Subjective/Chief Complaint: Patient reports having some increased abdominal pain and bloating Denies nausea Wants to eat Had BM   Objective: Vital signs in last 24 hours: Temp:  [97.6 F (36.4 C)-99.5 F (37.5 C)] 97.6 F (36.4 C) (08/16 0719) Pulse Rate:  [55-165] 87 (08/16 0800) Resp:  [17-28] 24 (08/16 0800) BP: (104-170)/(67-131) 145/78 (08/16 0800) SpO2:  [94 %-98 %] 96 % (08/16 0800) Last BM Date: 12/03/18  Intake/Output from previous day: 08/15 0701 - 08/16 0700 In: 900.6 [I.V.:858.4; IV Piggyback:42.2] Out: 1476 [Urine:1475; Stool:1] Intake/Output this shift: Total I/O In: -  Out: 100 [Urine:100]  Exam: Awake and alert Abdomen with distension this morning.  Has some mild to moderate upper tenderness but no frank peritonitis  Lab Results:  Recent Labs    12/02/18 0154 12/03/18 0435  WBC 21.4* 24.2*  HGB 11.8* 11.7*  HCT 35.8* 36.3*  PLT 312 500*   BMET Recent Labs    12/02/18 0154 12/03/18 0435  NA 138 137  K 3.8 3.1*  CL 106 103  CO2 22 21*  GLUCOSE 92 142*  BUN 19 16  CREATININE 0.91 0.91  CALCIUM 7.9* 8.1*   PT/INR No results for input(s): LABPROT, INR in the last 72 hours. ABG No results for input(s): PHART, HCO3 in the last 72 hours.  Invalid input(s): PCO2, PO2  Studies/Results: No results found.  Anti-infectives: Anti-infectives (From admission, onward)   Start     Dose/Rate Route Frequency Ordered Stop   12/01/18 0400  piperacillin-tazobactam (ZOSYN) IVPB 2.25 g  Status:  Discontinued     2.25 g 100 mL/hr over 30 Minutes Intravenous Every 8 hours 11/30/18 1838 12/01/18 0204   12/01/18 0400  piperacillin-tazobactam (ZOSYN) IVPB 3.375 g     3.375 g 12.5 mL/hr over 240 Minutes Intravenous Every 8 hours 12/01/18 0204     11/30/18 1839  vancomycin variable dose per unstable renal function (pharmacist dosing)  Status:  Discontinued      Does not apply See admin instructions 11/30/18 1839 12/01/18 1257   11/30/18 1715   piperacillin-tazobactam (ZOSYN) IVPB 2.25 g     2.25 g 100 mL/hr over 30 Minutes Intravenous STAT 11/30/18 1708 11/30/18 1955   11/30/18 1715  vancomycin (VANCOCIN) 2,000 mg in sodium chloride 0.9 % 500 mL IVPB     2,000 mg 250 mL/hr over 120 Minutes Intravenous STAT 11/30/18 1711 11/30/18 2030   11/30/18 1700  metroNIDAZOLE (FLAGYL) IVPB 500 mg  Status:  Discontinued     500 mg 100 mL/hr over 60 Minutes Intravenous  Once 11/30/18 1657 11/30/18 1710   11/30/18 1700  vancomycin (VANCOCIN) IVPB 1000 mg/200 mL premix  Status:  Discontinued     1,000 mg 200 mL/hr over 60 Minutes Intravenous  Once 11/30/18 1657 11/30/18 1711      Assessment/Plan: Possible perforated peptic ulcer  Will get a stat CT with contrast to see if perforation evident or fluid has developed into an abscess give distension and increased WBC. Will hold on NG as patient wants to drink the contrast. Otherwise NPO Hold anticoag meds May require surgical intervention  LOS: 3 days    Coralie Keens 12/03/2018

## 2018-12-03 NOTE — Progress Notes (Signed)
OT Cancellation Note  Patient Details Name: Philip Jackson MRN: [id number] DOB: [birth year]   Cancelled Treatment:    Reason Eval/Treat Not Completed: Patient at procedure or test/ unavailable OT order received, pt chart reviewed. Met with RN and RN preparing to place NG tube for ileus on pt. Will check back as schedule permits to initiate OT POC.   Zenovia Jarred, MSOT, OTR/L Behavioral Health OT/ Acute Relief OT [facility] Office: [place] 12/03/2018, 12:31 PM

## 2018-12-04 ENCOUNTER — Ambulatory Visit: Payer: BC Managed Care – PPO | Admitting: Physical Therapy

## 2018-12-04 LAB — BASIC METABOLIC PANEL
Anion gap: 8 (ref 5–15)
BUN: 13 mg/dL (ref 8–23)
CO2: 26 mmol/L (ref 22–32)
Calcium: 8 mg/dL — ABNORMAL LOW (ref 8.9–10.3)
Chloride: 104 mmol/L (ref 98–111)
Creatinine, Ser: 0.8 mg/dL (ref 0.61–1.24)
GFR calc Af Amer: >60 mL/min (ref >60)
GFR calc non Af Amer: >60 mL/min (ref >60)
Glucose, Bld: 129 mg/dL — ABNORMAL HIGH (ref 70–99)
Potassium: 3.7 mmol/L (ref 3.5–5.1)
Sodium: 138 mmol/L (ref 135–145)

## 2018-12-04 LAB — CBC
HCT: 33.5 % — ABNORMAL LOW (ref 39.0–52.0)
Hemoglobin: 10.7 g/dL — ABNORMAL LOW (ref 13.0–17.0)
MCH: 29.7 pg (ref 26.0–34.0)
MCHC: 31.9 g/dL (ref 30.0–36.0)
MCV: 93.1 fL (ref 80.0–100.0)
Platelets: 461 10*3/uL — ABNORMAL HIGH (ref 150–400)
RBC: 3.6 MIL/uL — ABNORMAL LOW (ref 4.22–5.81)
RDW: 12.5 % (ref 11.5–15.5)
WBC: 17.4 10*3/uL — ABNORMAL HIGH (ref 4.0–10.5)
nRBC: 0 % (ref 0.0–0.2)

## 2018-12-04 LAB — URINE CULTURE
Culture: NO GROWTH
Special Requests: NORMAL

## 2018-12-04 LAB — HEPARIN LEVEL (UNFRACTIONATED)
Heparin Unfractionated: 0.31 IU/mL (ref 0.30–0.70)
Heparin Unfractionated: 0.51 IU/mL (ref 0.30–0.70)

## 2018-12-04 MED ORDER — DILTIAZEM HCL 100 MG IV SOLR
5.0000 mg/h | INTRAVENOUS | Status: AC
  Administered 2018-12-04: 10:00:00 5 mg/h via INTRAVENOUS
  Filled 2018-12-04 (×2): qty 100

## 2018-12-04 MED ORDER — DILTIAZEM HCL 100 MG IV SOLR
5.0000 mg/h | INTRAVENOUS | Status: AC
  Administered 2018-12-04: 02:00:00 2.5 mg/h via INTRAVENOUS
  Filled 2018-12-04: qty 100

## 2018-12-04 MED ORDER — POTASSIUM CHLORIDE 10 MEQ/100ML IV SOLN
10.0000 meq | INTRAVENOUS | Status: AC
  Administered 2018-12-04 (×2): 10 meq via INTRAVENOUS
  Filled 2018-12-04 (×2): qty 100

## 2018-12-04 MED ORDER — LABETALOL HCL 5 MG/ML IV SOLN
10.0000 mg | Freq: Once | INTRAVENOUS | Status: DC
  Filled 2018-12-04 (×3): qty 4

## 2018-12-04 MED ORDER — HYDRALAZINE HCL 20 MG/ML IJ SOLN
10.0000 mg | Freq: Four times a day (QID) | INTRAMUSCULAR | Status: DC | PRN
  Administered 2018-12-04: 10 mg via INTRAVENOUS
  Filled 2018-12-04: qty 1

## 2018-12-04 MED ORDER — POTASSIUM CHLORIDE 10 MEQ/100ML IV SOLN: 10.0000 meq | INTRAVENOUS | Status: DC

## 2018-12-04 MED ORDER — HEPARIN (PORCINE) 25000 UT/250ML-% IV SOLN
1900.0000 [IU]/h | INTRAVENOUS | Status: AC
  Administered 2018-12-04 – 2018-12-06 (×4): 1900 [IU]/h via INTRAVENOUS
  Filled 2018-12-04 (×4): qty 250

## 2018-12-04 NOTE — Progress Notes (Addendum)
While ambulating in the hall patient heart rhythm converted to a-fib, HR sustaining in the 150's-170's.  EKG done.  IV cardizem increased to 10, PRN IV metoprolol given.  Blood pressure 118/91.  PT resting comfortably in the chair.

## 2018-12-04 NOTE — Progress Notes (Signed)
Pharmacy Antibiotic Note  Philip Jackson is a 68 y.o. male admitted on 11/30/2018 with sepsis secondary to intra-abdominal process/duodenitis vs. knee infection.  Pharmacy has been consulted for Vancomycin and Zosyn dosing.  Plan: D4 full abx Zosyn 3.375gm q8, 4 hr infusion Sepsis 2/2 duodenitis  Continues Afebrile, WBC 43.8 >> 17.4, SCr 3.16 >> 0.8 (AKI resolved)  Height: 5\' 9"  (175.3 cm) Weight: 210 lb 15.7 oz (95.7 kg) IBW/kg (Calculated) : 70.7  Temp (24hrs), Avg:97.7 F (36.5 C), Min:97.5 F (36.4 C), Max:97.9 F (36.6 C)  Recent Labs  Lab 11/30/18 1445 11/30/18 1622 11/30/18 1920 12/01/18 0221 12/02/18 0154 12/03/18 0435 12/03/18 0825 12/03/18 1121 12/04/18 0609  WBC 43.8*  --   --  29.4* 21.4* 24.2*  --   --  17.4*  CREATININE 4.29*  --  3.16* 2.15* 0.91 0.91  --   --  0.80  LATICACIDVEN  --  1.5  --   --   --   --  0.9 1.0  --     Estimated Creatinine Clearance: 100.9 mL/min (by C-G formula based on SCr of 0.8 mg/dL).    Allergies  Allergen Reactions  . 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 Nsaids     Duodenal ulcer.  anticoagulated    Antimicrobials this admission: 8/13 Vancomycin >> 8/14 8/13 Zosyn >>  Microbiology results: 8/13 BCx: ngtd 8/13 COVID: negative  8/16 C diff: negative/negative 8/16 UCx: sent  Thank you for allowing pharmacy to be a part of this patient's care.  Minda Ditto PharmD Pager [phone] 12/04/2018, 8:25 AM

## 2018-12-04 NOTE — Progress Notes (Signed)
Assumed care of this patient at 1115.  I agree with the previous nurses assessment.

## 2018-12-04 NOTE — Progress Notes (Signed)
Patient ID: Philip Jackson, male   DOB: 1950-08-07, 68 y.o.   MRN: [id number]       Subjective: Patient states he feels better today.  Just had a large BM.    Objective: Vital signs in last 24 hours: Temp:  [97.5 F (36.4 C)-97.8 F (36.6 C)] 97.6 F (36.4 C) (08/17 0400) Pulse Rate:  [72-116] 80 (08/17 0630) Resp:  [13-29] 14 (08/17 0630) BP: (137-191)/(70-103) 141/90 (08/17 0630) SpO2:  [88 %-100 %] 99 % (08/17 0630) Last BM Date: 12/03/18  Intake/Output from previous day: 08/16 0701 - 08/17 0700 In: 3090.2 [I.V.:2609.6; IV Piggyback:480.6] Out: 4100 [Urine:1200; Emesis/NG output:2900] Intake/Output this shift: No intake/output data recorded.  PE: Abd: soft, but protuberant, +BS, essentially nontender, NGT in place with bilious output.  Lab Results:  Recent Labs    12/03/18 0435 12/04/18 0609  WBC 24.2* 17.4*  HGB 11.7* 10.7*  HCT 36.3* 33.5*  PLT 500* 461*   BMET Recent Labs    12/03/18 0435 12/04/18 0609  NA 137 138  K 3.1* 3.7  CL 103 104  CO2 21* 26  GLUCOSE 142* 129*  BUN 16 13  CREATININE 0.91 0.80  CALCIUM 8.1* 8.0*   PT/INR No results for input(s): LABPROT, INR in the last 72 hours. CMP     Component Value Date/Time   NA 138 12/04/2018 0609   K 3.7 12/04/2018 0609   CL 104 12/04/2018 0609   CO2 26 12/04/2018 0609   GLUCOSE 129 (H) 12/04/2018 0609   BUN 13 12/04/2018 0609   CREATININE 0.80 12/04/2018 0609   CALCIUM 8.0 (L) 12/04/2018 0609   PROT 6.3 (L) 12/03/2018 0435   ALBUMIN 2.6 (L) 12/03/2018 0435   AST 30 12/03/2018 0435   ALT 48 (H) 12/03/2018 0435   ALKPHOS 53 12/03/2018 0435   BILITOT 1.4 (H) 12/03/2018 0435   GFRNONAA >60 12/04/2018 0609   GFRAA >60 12/04/2018 0609   Lipase     Component Value Date/Time   LIPASE 26 11/30/2018 1445       Studies/Results: Dg Abd 1 View  Result Date: 12/03/2018 CLINICAL DATA:  Abdominal distention. EXAM: ABDOMEN - 1 VIEW COMPARISON:  None. FINDINGS: Multiple mild to moderately dilated  small bowel loops are seen, with a paucity of colonic gas. This is suspicious for small bowel obstruction. IMPRESSION: Findings suspicious for small bowel obstruction. Electronically Signed   By: Marlaine Hind M.D.   On: 12/03/2018 08:57   Ct Abdomen Pelvis W Contrast  Result Date: 12/03/2018 CLINICAL DATA:  Mid abdominal pain with nausea and vomiting, gastric ulcer. EXAM: CT ABDOMEN AND PELVIS WITH CONTRAST TECHNIQUE: Multidetector CT imaging of the abdomen and pelvis was performed using the standard protocol following bolus administration of intravenous contrast. CONTRAST:  135mL OMNIPAQUE IOHEXOL 300 MG/ML  SOLN COMPARISON:  CT abdomen dated 11/30/2018. FINDINGS: Lower chest: Small bilateral pleural effusions, RIGHT slightly greater than LEFT, with associated passive atelectasis. Hepatobiliary: No focal liver abnormality is seen. No gallstones, gallbladder wall thickening, or biliary dilatation. Pancreas: Unremarkable. No pancreatic ductal dilatation or surrounding inflammatory changes. Spleen: Normal in size without focal abnormality. Adrenals/Urinary Tract: Adrenal glands appear normal. Mild RIGHT-sided hydronephrosis and moderate RIGHT-sided hydroureter. RIGHT periureteral inflammation/fluid stranding and thickening/enhancement of the walls of the proximal RIGHT ureter. LEFT kidney is unremarkable without mass, stone or hydronephrosis. LEFT ureter appears grossly normal. Bladder is decompressed by Foley catheter. Hyperdense material at the bladder base, contiguous with the underlying prostate gland versus intraluminal bladder wall mass or blood products.  Stomach/Bowel: The large bowel is decompressed throughout. Appendix is normal. Distal small bowel is decompressed. Remainder of the small bowel is mildly to moderately distended with fluid and air with ileus being more likely than mechanical obstruction given the lack of transition zone. Stomach is also distended, moderate to severe in degree. Decreased  thickening of the walls of the 2/3 portions of duodenum. The periduodenal fluid and/or phlegmonous change in the retroperitoneum is slightly less prominent. Vascular/Lymphatic: Aortic atherosclerosis. No acute appearing vascular abnormality. No enlarged lymph nodes seen in the abdomen or pelvis. Reproductive: Prostate gland is enlarged. Other: No circumscribed abscess collection seen. Small amount of free fluid in the lower pelvis. No free intraperitoneal air. Musculoskeletal: No acute or suspicious osseous finding. Degenerative spondylosis of the thoracolumbar spine, mild to moderate in degree. Anasarca within the subcutaneous soft tissues of the lower abdomen and pelvis. IMPRESSION: 1. Decreased thickening of the walls of the 2/3 portions of the duodenum suggesting interval improvement. The periduodenal fluid and/or phlegmonous change in the retroperitoneum is also slightly less prominent. No circumscribed abscess collection seen. No free intraperitoneal air to suggest perforation. 2. Stable mild RIGHT-sided hydronephrosis and moderate RIGHT-sided hydroureter, with associated RIGHT periureteral inflammation/fluid stranding and thickening/enhancement of the walls of the proximal RIGHT ureter. This is likely reactive to the adjacent periduodenal fluid and phlegmonous change. Cannot exclude secondary ureteral infection. 3. Bladder is decompressed by Foley catheter. Hyperdense material at the bladder base, contiguous with the underlying prostate gland versus intraluminal bladder wall mass or blood products. Recommend Urology consultation for possible cystoscopy. 4. Majority of the small bowel is mildly to moderately distended with fluid and air. Stomach is also distended, moderate to severe in degree. Findings are most suggestive of ileus, less likely small bowel obstruction without definable transition zone. 5. Anasarca within the subcutaneous soft tissues of the lower abdomen and pelvis. 6. Small bilateral pleural  effusions, RIGHT slightly greater than LEFT, with associated passive atelectasis. 7. Aortic atherosclerosis. Aortic Atherosclerosis (ICD10-I70.0). Electronically Signed   By: Franki Cabot M.D.   On: 12/03/2018 11:27    Anti-infectives: Anti-infectives (From admission, onward)   Start     Dose/Rate Route Frequency Ordered Stop   12/01/18 0400  piperacillin-tazobactam (ZOSYN) IVPB 2.25 g  Status:  Discontinued     2.25 g 100 mL/hr over 30 Minutes Intravenous Every 8 hours 11/30/18 1838 12/01/18 0204   12/01/18 0400  piperacillin-tazobactam (ZOSYN) IVPB 3.375 g     3.375 g 12.5 mL/hr over 240 Minutes Intravenous Every 8 hours 12/01/18 0204     11/30/18 1839  vancomycin variable dose per unstable renal function (pharmacist dosing)  Status:  Discontinued      Does not apply See admin instructions 11/30/18 1839 12/01/18 1257   11/30/18 1715  piperacillin-tazobactam (ZOSYN) IVPB 2.25 g     2.25 g 100 mL/hr over 30 Minutes Intravenous STAT 11/30/18 1708 11/30/18 1955   11/30/18 1715  vancomycin (VANCOCIN) 2,000 mg in sodium chloride 0.9 % 500 mL IVPB     2,000 mg 250 mL/hr over 120 Minutes Intravenous STAT 11/30/18 1711 11/30/18 2030   11/30/18 1700  metroNIDAZOLE (FLAGYL) IVPB 500 mg  Status:  Discontinued     500 mg 100 mL/hr over 60 Minutes Intravenous  Once 11/30/18 1657 11/30/18 1710   11/30/18 1700  vancomycin (VANCOCIN) IVPB 1000 mg/200 mL premix  Status:  Discontinued     1,000 mg 200 mL/hr over 60 Minutes Intravenous  Once 11/30/18 1657 11/30/18 1711  Assessment/Plan Duodenitis with possible RTP perforation -now with ileus, NGT in place and remain for now awaiting better bowel function, likely of the stomach as he has good BS and just had a large BM. -duodenitis looked improved on recent CT scan.  No plans for surgical intervention -mobilize! -pulm toilet -WBC improving, down to 17K from 24K -keep K above 4 for ileus  FEN - NPO, IVFs, NGT VTE - SCDs ID - zosyn   LOS:  4 days    Henreitta Cea , [facility] 12/04/2018, 8:09 AM Pager: [phone]

## 2018-12-04 NOTE — Progress Notes (Signed)
Philip Jackson  KCM:[id number] DOB: 1950-12-05 DOA: 11/30/2018 PCP: Leonides Sake, MD   Brief Narrative: 68 year old with past medical history significant for A. fib on Eliquis, GERD, hypertension, OSA who presented complaining of acute onset progressively getting worse abdominal pain and distention since 2 days prior to admission.  Patient had a recent arthroplasty by Dr. Wynelle Link on 11/20/2018.  Patient has been also complaining of swelling in his right leg since surgery. Patient presented with AKI with a creatinine of 4.2, hypoxemia, A. fib with RVR, signs of sepsis, CT abdomen showed possible duodenitis and perforation.  Surgery was consulted they are recommending conservative management.  Assessment & Plan:   Principal Problem:   AKI (acute kidney injury) ([place]) Active Problems:   HTN (hypertension)   Atrial fibrillation with RVR ([facility])   HLD (hyperlipidemia)   OSA treated with BiPAP   Duodenitis   Urine retention   Hyperkalemia    1-A fib RVR:  Likely related to acute illness.  Holding eliquis due to duodenitis, monitor for GI bleed and need for surgery.  Discussed with Dr Ninfa Linden plan to resume heparin gtt and monitor for sign of bleeding.  Continue with cardizem Gtt, IV metoprolol.  Continue with heparin.   2-Sepsis; patient presents with leukocytosis, tachycardia, tachypnea.  Related to intraabdominal process, contain perforation.  Blood culture no growth to date.  Continue with IV Zosyn.  Now the orthopedic does not have any concern with knee infection will discontinue vancomycin. Decreased IV fluids.  WBC on admission 43..  WBC continue to decrease today at 17.   3-AKI; presents with cr at 4. Could be related to obstructive uropathy, Hypovolemia, ACE, sepsis.  Has foley catheter, yielding 2 L urine.  Renal function improved. Decreased IV fluids.  CT abdomen showed Hydronephrosis, ? Bladder mass vs blood product. . Urology has been consulted. Mass  might be related to enlarge prostate. Patient will need to follow up with urology outpatient for voiding trial.  Patient will need to be discharge with foley.   4-Duodenitis; Perforation contained.  Ileus Discussed case with Dr Marcello Moores, she reviewed CT abdomen, she doesn't see free air. If he has perforation is contain.  Support care and monitor closely.  Appreciate surgery evaluation. Continue with  IV fluids, IV Protonix, IV antibiotics. Patient with worsening abdominal pain, distension. Found to have ileus.  CT abdomen with improvement duodenum thickening and decrease phlegmon. Ileus.  Continue with NG tube.   5-Acute hypoxic respiratory failure; related to intra-abdominal process, decrease lung compliance.  He report improvement of dyspnea.  Oxygen sat improved on 2 L.  Monitor.   6-Hyperkalemia;  Repeat b-met. He received IV fluids.  Suspect related to AKI.  Resolved.  7-Hypokalemia;  Replete IV.   8-Diarrhea; Had Multiples BM.  C diff negative.   9-Hyponatremia; related to hypovolemia. IV fluids.  Improved.  10-Right knee, recent sx; redness and gas on CT abdomen.  Orthopedic has been consulted.  Appreciate Dr. Wynelle Link evaluation.  No signs of infection.  Constipated.  Had multiples BM resolved  Estimated body mass index is 31.16 kg/m as calculated from the following:   Height as of this encounter: '5\' 9"'$  (1.753 m).   Weight as of this encounter: 95.7 kg.   DVT prophylaxis: scd Code Status: full code Family Communication: care discussed with wife 8/16 Disposition Plan: remain in the step down, for care of possible duodenal perforation, contain. AKI Consultants:   Surgery   Procedures:   none  Antimicrobials:    Subjective: Report mild abdominal pain, improved since yesterday.  Has NG tube in place.  Had BM  Objective: Vitals:   12/04/18 0400 12/04/18 0600 12/04/18 0615 12/04/18 0630  BP: (!) 163/88 (!) 178/85 (!) 159/85 (!) 141/90  Pulse: 87  85 85 80  Resp: '17 13 17 14  '$ Temp: 97.6 F (36.4 C)     TempSrc: Oral     SpO2: 97% 98% 95% 99%  Weight:      Height:        Intake/Output Summary (Last 24 hours) at 12/04/2018 0755 Last data filed at 12/04/2018 0383 Gross per 24 hour  Intake 3090.19 ml  Output 4100 ml  Net -1009.81 ml   Filed Weights   11/30/18 2120 12/02/18 0500  Weight: 97.2 kg 95.7 kg    Examination:  General exam: NAD, NG tube in place.  Respiratory system: CTA Cardiovascular system: S 1, S  2RRR Gastrointestinal system: BS present, soft, mild distended, no rigidity  Central nervous system: alert, follows command Extremities: symmetric power.  Skin: no rashes.   Data Reviewed: I have personally reviewed following labs and imaging studies  CBC: Recent Labs  Lab 11/30/18 1445 12/01/18 0221 12/02/18 0154 12/03/18 0435 12/04/18 0609  WBC 43.8* 29.4* 21.4* 24.2* 17.4*  NEUTROABS 33.7*  --   --   --   --   HGB 11.7* 11.0* 11.8* 11.7* 10.7*  HCT 35.4* 34.8* 35.8* 36.3* 33.5*  MCV 93.2 94.6 93.0 92.6 93.1  PLT 380 298 312 500* 338*   Basic Metabolic Panel: Recent Labs  Lab 11/30/18 1920 12/01/18 0221 12/02/18 0154 12/03/18 0435 12/04/18 0609  NA 130* 132* 138 137 138  K 4.6 4.2 3.8 3.1* 3.7  CL 94* 100 106 103 104  CO2 24 20* 22 21* 26  GLUCOSE 89 99 92 142* 129*  BUN 49* 39* '19 16 13  '$ CREATININE 3.16* 2.15* 0.91 0.91 0.80  CALCIUM 8.3* 8.0* 7.9* 8.1* 8.0*   GFR: Estimated Creatinine Clearance: 100.9 mL/min (by C-G formula based on SCr of 0.8 mg/dL). Liver Function Tests: Recent Labs  Lab 11/30/18 1445 12/03/18 0435  AST 33 30  ALT 32 48*  ALKPHOS 58 53  BILITOT 1.4* 1.4*  PROT 7.0 6.3*  ALBUMIN 3.3* 2.6*   Recent Labs  Lab 11/30/18 1445  LIPASE 26   No results for input(s): AMMONIA in the last 168 hours. Coagulation Profile: No results for input(s): INR, PROTIME in the last 168 hours. Cardiac Enzymes: No results for input(s): CKTOTAL, CKMB, CKMBINDEX, TROPONINI in  the last 168 hours. BNP (last 3 results) No results for input(s): PROBNP in the last 8760 hours. HbA1C: No results for input(s): HGBA1C in the last 72 hours. CBG: No results for input(s): GLUCAP in the last 168 hours. Lipid Profile: No results for input(s): CHOL, HDL, LDLCALC, TRIG, CHOLHDL, LDLDIRECT in the last 72 hours. Thyroid Function Tests: No results for input(s): TSH, T4TOTAL, FREET4, T3FREE, THYROIDAB in the last 72 hours. Anemia Panel: No results for input(s): VITAMINB12, FOLATE, FERRITIN, TIBC, IRON, RETICCTPCT in the last 72 hours. Sepsis Labs: Recent Labs  Lab 11/30/18 1622 12/03/18 0825 12/03/18 1121  LATICACIDVEN 1.5 0.9 1.0    Recent Results (from the past 240 hour(s))  Blood culture (routine x 2)     Status: None (Preliminary result)   Collection Time: 11/30/18  4:12 PM   Specimen: BLOOD  Result Value Ref Range Status   Specimen Description   Final    BLOOD  RIGHT ANTECUBITAL Performed at [facility] [address] [place], [place] [address]    Special Requests   Final    BOTTLES DRAWN AEROBIC ONLY Blood Culture adequate volume Performed at [facility] [address], [place], [place] [address]    Culture   Final    NO GROWTH 4 DAYS Performed at [facility], [place] [address], [place], [place] [address]    Report Status PENDING  Incomplete  Blood culture (routine x 2)     Status: None (Preliminary result)   Collection Time: 11/30/18  4:12 PM   Specimen: BLOOD  Result Value Ref Range Status   Specimen Description   Final    BLOOD LEFT ANTECUBITAL Performed at [facility] [address] [place], [place] [address]    Special Requests   Final    BOTTLES DRAWN AEROBIC ONLY Blood Culture adequate volume Performed at [facility] [address] [place], [place] [address]    Culture   Final    NO GROWTH 4 DAYS Performed at [facility], [place] [address],  [place], [place] [address]    Report Status PENDING  Incomplete  SARS Coronavirus 2 [facility] order, Performed in [facility] hospital lab) Nasopharyngeal Nasopharyngeal Swab     Status: None   Collection Time: 11/30/18  5:54 PM   Specimen: Nasopharyngeal Swab  Result Value Ref Range Status   SARS Coronavirus 2 NEGATIVE NEGATIVE Final    Comment: (NOTE) If result is NEGATIVE SARS-CoV-2 target nucleic acids are NOT DETECTED. The SARS-CoV-2 RNA is generally detectable in upper and lower  respiratory specimens during the acute phase of infection. The lowest  concentration of SARS-CoV-2 viral copies this assay can detect is 250  copies / mL. A negative result does not preclude SARS-CoV-2 infection  and should not be used as the sole basis for treatment or other  patient management decisions.  A negative result may occur with  improper specimen collection / handling, submission of specimen other  than nasopharyngeal swab, presence of viral mutation(s) within the  areas targeted by this assay, and inadequate number of viral copies  (<250 copies / mL). A negative result must be combined with clinical  observations, patient history, and epidemiological information. If result is POSITIVE SARS-CoV-2 target nucleic acids are DETECTED. The SARS-CoV-2 RNA is generally detectable in upper and lower  respiratory specimens dur ing the acute phase of infection.  Positive  results are indicative of active infection with SARS-CoV-2.  Clinical  correlation with patient history and other diagnostic information is  necessary to determine patient infection status.  Positive results do  not rule out bacterial infection or co-infection with other viruses. If result is PRESUMPTIVE POSTIVE SARS-CoV-2 nucleic acids MAY BE PRESENT.   A presumptive positive result was obtained on the submitted specimen  and confirmed on repeat testing.  While 2019 novel coronavirus  (SARS-CoV-2) nucleic acids may be present in the  submitted sample  additional confirmatory testing may be necessary for epidemiological  and / or clinical management purposes  to differentiate between  SARS-CoV-2 and other Sarbecovirus currently known to infect humans.  If clinically indicated additional testing with an alternate test  methodology [phone]) is advised. The SARS-CoV-2 RNA is generally  detectable in upper and lower respiratory sp ecimens during the acute  phase of infection. The expected result is Negative. Fact Sheet for Patients:  StrictlyIdeas.no Fact Sheet for Healthcare Providers: BankingDealers.co.za This test  is not yet approved or cleared by the [place] and has been authorized for detection and/or diagnosis of SARS-CoV-2 by FDA under an Emergency Use Authorization (EUA).  This EUA will remain in effect (meaning this test can be used) for the duration of the COVID-19 declaration under Section 564(b)(1) of the Act, 21 U.S.C. section 360bbb-3(b)(1), unless the authorization is terminated or revoked sooner. Performed at [facility], [place] [address] [place], [place] [address]   C difficile quick scan w PCR reflex     Status: None   Collection Time: 12/03/18  5:42 AM   Specimen: STOOL  Result Value Ref Range Status   C Diff antigen NEGATIVE NEGATIVE Final   C Diff toxin NEGATIVE NEGATIVE Final   C Diff interpretation No C. difficile detected.  Final    Comment: Performed at [facility], [place] [address] [place], [place] [address]         Radiology Studies: Dg Abd 1 View  Result Date: 12/03/2018 CLINICAL DATA:  Abdominal distention. EXAM: ABDOMEN - 1 VIEW COMPARISON:  None. FINDINGS: Multiple mild to moderately dilated small bowel loops are seen, with a paucity of colonic gas. This is suspicious for small bowel obstruction. IMPRESSION: Findings suspicious for small bowel obstruction. Electronically Signed   By:  Marlaine Hind M.D.   On: 12/03/2018 08:57   Ct Abdomen Pelvis W Contrast  Result Date: 12/03/2018 CLINICAL DATA:  Mid abdominal pain with nausea and vomiting, gastric ulcer. EXAM: CT ABDOMEN AND PELVIS WITH CONTRAST TECHNIQUE: Multidetector CT imaging of the abdomen and pelvis was performed using the standard protocol following bolus administration of intravenous contrast. CONTRAST:  133m OMNIPAQUE IOHEXOL 300 MG/ML  SOLN COMPARISON:  CT abdomen dated 11/30/2018. FINDINGS: Lower chest: Small bilateral pleural effusions, RIGHT slightly greater than LEFT, with associated passive atelectasis. Hepatobiliary: No focal liver abnormality is seen. No gallstones, gallbladder wall thickening, or biliary dilatation. Pancreas: Unremarkable. No pancreatic ductal dilatation or surrounding inflammatory changes. Spleen: Normal in size without focal abnormality. Adrenals/Urinary Tract: Adrenal glands appear normal. Mild RIGHT-sided hydronephrosis and moderate RIGHT-sided hydroureter. RIGHT periureteral inflammation/fluid stranding and thickening/enhancement of the walls of the proximal RIGHT ureter. LEFT kidney is unremarkable without mass, stone or hydronephrosis. LEFT ureter appears grossly normal. Bladder is decompressed by Foley catheter. Hyperdense material at the bladder base, contiguous with the underlying prostate gland versus intraluminal bladder wall mass or blood products. Stomach/Bowel: The large bowel is decompressed throughout. Appendix is normal. Distal small bowel is decompressed. Remainder of the small bowel is mildly to moderately distended with fluid and air with ileus being more likely than mechanical obstruction given the lack of transition zone. Stomach is also distended, moderate to severe in degree. Decreased thickening of the walls of the 2/3 portions of duodenum. The periduodenal fluid and/or phlegmonous change in the retroperitoneum is slightly less prominent. Vascular/Lymphatic: Aortic  atherosclerosis. No acute appearing vascular abnormality. No enlarged lymph nodes seen in the abdomen or pelvis. Reproductive: Prostate gland is enlarged. Other: No circumscribed abscess collection seen. Small amount of free fluid in the lower pelvis. No free intraperitoneal air. Musculoskeletal: No acute or suspicious osseous finding. Degenerative spondylosis of the thoracolumbar spine, mild to moderate in degree. Anasarca within the subcutaneous soft tissues of the lower abdomen and pelvis. IMPRESSION: 1. Decreased thickening of the walls of the 2/3 portions of the duodenum suggesting interval improvement. The periduodenal fluid and/or phlegmonous change in the retroperitoneum is also slightly less prominent. No circumscribed abscess collection seen.  No free intraperitoneal air to suggest perforation. 2. Stable mild RIGHT-sided hydronephrosis and moderate RIGHT-sided hydroureter, with associated RIGHT periureteral inflammation/fluid stranding and thickening/enhancement of the walls of the proximal RIGHT ureter. This is likely reactive to the adjacent periduodenal fluid and phlegmonous change. Cannot exclude secondary ureteral infection. 3. Bladder is decompressed by Foley catheter. Hyperdense material at the bladder base, contiguous with the underlying prostate gland versus intraluminal bladder wall mass or blood products. Recommend Urology consultation for possible cystoscopy. 4. Majority of the small bowel is mildly to moderately distended with fluid and air. Stomach is also distended, moderate to severe in degree. Findings are most suggestive of ileus, less likely small bowel obstruction without definable transition zone. 5. Anasarca within the subcutaneous soft tissues of the lower abdomen and pelvis. 6. Small bilateral pleural effusions, RIGHT slightly greater than LEFT, with associated passive atelectasis. 7. Aortic atherosclerosis. Aortic Atherosclerosis (ICD10-I70.0). Electronically Signed   By: Franki Cabot M.D.   On: 12/03/2018 11:27        Scheduled Meds:  bisacodyl  10 mg Rectal Daily   Chlorhexidine Gluconate Cloth  6 each Topical Daily   labetalol  10 mg Intravenous Once   lip balm  1 application Topical BID   mouth rinse  15 mL Mouth Rinse BID   metoprolol tartrate  2.5 mg Intravenous Q8H   pantoprazole (PROTONIX) IV  40 mg Intravenous Q12H   Continuous Infusions:  sodium chloride Stopped (12/02/18 0536)   dextrose 5 % and 0.45 % NaCl with KCl 20 mEq/L 75 mL/hr at 12/04/18 0312   diltiazem (CARDIZEM) infusion 2.5 mg/hr (12/04/18 0134)   heparin 1,700 Units/hr (12/04/18 0135)   lactated ringers     methocarbamol (ROBAXIN) IV     piperacillin-tazobactam (ZOSYN)  IV 3.375 g (12/04/18 0606)   potassium chloride       LOS: 4 days    Time spent: 35 minutes.     Elmarie Shiley, MD [facility] Pager [phone]  If 7PM-7AM, please contact night-coverage [email or web address] Password [facility] 12/04/2018, 7:55 AM

## 2018-12-04 NOTE — Progress Notes (Addendum)
Patient converted back to NSR at 1505.  HR in the 80's.  MD made aware.

## 2018-12-04 NOTE — Progress Notes (Addendum)
[place] for heparin Indication: atrial fibrillation - bridge therapy while eliquis on hold  Allergies  Allergen Reactions  . 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 Nsaids     Duodenal ulcer.  anticoagulated   Patient Measurements: Height: 5\' 9"  (175.3 cm) Weight: 210 lb 15.7 oz (95.7 kg) IBW/kg (Calculated) : 70.7 Heparin Dosing Weight: 91 kg  Vital Signs: Temp: 97.9 F (36.6 C) (08/17 0800) Temp Source: Oral (08/17 0800) BP: 141/90 (08/17 0630) Pulse Rate: 80 (08/17 0630)  Labs: Recent Labs    12/02/18 0154  12/02/18 1809 12/03/18 0435 12/03/18 2102 12/04/18 0609  HGB 11.8*  --   --  11.7*  --  10.7*  HCT 35.8*  --   --  36.3*  --  33.5*  PLT 312  --   --  500*  --  461*  APTT  --    < > 40* 57* 76*  --   HEPARINUNFRC  --    < > 0.35 0.45 0.35 0.31  CREATININE 0.91  --   --  0.91  --  0.80   < > = values in this interval not displayed.   Estimated Creatinine Clearance: 100.9 mL/min (by C-G formula based on SCr of 0.8 mg/dL).  Assessment: 68 yo M on Eliquis PTA for Afib.  Admitted with duodenitis and possible RTP perforation.  Pharmacy consulted to start heparin bridge therapy while Eliquis on hold.   PTA Eliquis 5 mg po bid, last dose 8/13 at 07 am. Pt in AKI on admission that has now resolved.  Hg 11.8, PLTC 312 - stable, no bleeding reported.  8/16: Heparin drip was turned off this morning at 0810 AM by CCS for possible surgical intervention, resumed  Today, 12/04/2018 Hep level 0.31 units/ml on 1700 units/hr  Goal of Therapy:  Heparin level 0.3-0.7 units/ml aPTT 66-102 seconds Monitor platelets by anticoagulation protocol: Yes   Plan:  Increase heparin drip to 1900 units/hr Recheck Hep level in 6 hr (1500) Daily HL, CBC  Minda Ditto PharmD Pager [phone] 12/04/2018, 9:52 AM   Addendum: Follow up Heparin level after rate increase Hep level 0.51 units/ml on Heparin at 1900 units/hr Continue same  rate Daily Heparin level, in am
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
## 2018-12-04 NOTE — Progress Notes (Signed)
Pt currently refusing CPAP QHS at this time.  Pt has NG tube in place, machine remains ready at bedside if pt decides to wear.  RT to monitor and assess as needed.

## 2018-12-05 LAB — COMPREHENSIVE METABOLIC PANEL
ALT: 30 U/L (ref 0–44)
AST: 20 U/L (ref 15–41)
Albumin: 2.7 g/dL — ABNORMAL LOW (ref 3.5–5.0)
Alkaline Phosphatase: 45 U/L (ref 38–126)
Anion gap: 9 (ref 5–15)
BUN: 11 mg/dL (ref 8–23)
CO2: 22 mmol/L (ref 22–32)
Calcium: 8.1 mg/dL — ABNORMAL LOW (ref 8.9–10.3)
Chloride: 106 mmol/L (ref 98–111)
Creatinine, Ser: 0.75 mg/dL (ref 0.61–1.24)
GFR calc Af Amer: >60 mL/min (ref >60)
GFR calc non Af Amer: >60 mL/min (ref >60)
Glucose, Bld: 110 mg/dL — ABNORMAL HIGH (ref 70–99)
Potassium: 3.7 mmol/L (ref 3.5–5.1)
Sodium: 137 mmol/L (ref 135–145)
Total Bilirubin: 0.8 mg/dL (ref 0.3–1.2)
Total Protein: 6.3 g/dL — ABNORMAL LOW (ref 6.5–8.1)

## 2018-12-05 LAB — CBC
HCT: 34.7 % — ABNORMAL LOW (ref 39.0–52.0)
Hemoglobin: 10.9 g/dL — ABNORMAL LOW (ref 13.0–17.0)
MCH: 29.5 pg (ref 26.0–34.0)
MCHC: 31.4 g/dL (ref 30.0–36.0)
MCV: 94 fL (ref 80.0–100.0)
Platelets: 414 10*3/uL — ABNORMAL HIGH (ref 150–400)
RBC: 3.69 MIL/uL — ABNORMAL LOW (ref 4.22–5.81)
RDW: 12.7 % (ref 11.5–15.5)
WBC: 15 10*3/uL — ABNORMAL HIGH (ref 4.0–10.5)
nRBC: 0 % (ref 0.0–0.2)

## 2018-12-05 LAB — CULTURE, BLOOD (ROUTINE X 2)
Culture: NO GROWTH
Culture: NO GROWTH
Special Requests: ADEQUATE
Special Requests: ADEQUATE

## 2018-12-05 LAB — HEPARIN LEVEL (UNFRACTIONATED)
Heparin Unfractionated: 0.48 IU/mL (ref 0.30–0.70)
Heparin Unfractionated: 0.45 IU/mL (ref 0.30–0.70)

## 2018-12-05 LAB — HEMOGLOBIN AND HEMATOCRIT, BLOOD
HCT: 39.1 % (ref 39.0–52.0)
Hemoglobin: 12.5 g/dL — ABNORMAL LOW (ref 13.0–17.0)

## 2018-12-05 MED ORDER — POTASSIUM CHLORIDE 20 MEQ/15ML (10%) PO SOLN
40.0000 meq | Freq: Every day | ORAL | Status: DC
  Administered 2018-12-05: 10:00:00 40 meq via ORAL
  Filled 2018-12-05: qty 30

## 2018-12-05 MED ORDER — ZOLPIDEM TARTRATE 5 MG PO TABS
5.0000 mg | ORAL_TABLET | Freq: Every evening | ORAL | Status: DC | PRN
  Administered 2018-12-08 – 2018-12-09 (×2): 5 mg via ORAL
  Filled 2018-12-05 (×2): qty 1

## 2018-12-05 NOTE — Progress Notes (Addendum)
Patient ID: Philip Jackson, male   DOB: 12-Apr-1951, 68 y.o.   MRN: [id number]       Subjective: Had 3 BMs overnight.  No nausea.  Not ambulating as he says he has a fib and can't.  Objective: Vital signs in last 24 hours: Temp:  [97.3 F (36.3 C)-97.9 F (36.6 C)] 97.9 F (36.6 C) (08/18 0800) Pulse Rate:  [49-104] 87 (08/18 0800) Resp:  [11-20] 18 (08/18 0800) BP: (106-175)/(60-105) 165/105 (08/18 0800) SpO2:  [92 %-97 %] 97 % (08/18 0800) Last BM Date: 12/04/18  Intake/Output from previous day: 08/17 0701 - 08/18 0700 In: 1629.4 [I.V.:1429.4; IV Piggyback:200] Out: 6301 [Urine:3900; Emesis/NG output:450; Stool:2] Intake/Output this shift: No intake/output data recorded.  PE: Abd: soft, ND, NGT flushed and not output.  Only 450 recorded yesterday, nothing significant overnight.  +BS, minimally tender in RUQ  Lab Results:  Recent Labs    12/04/18 0609 12/05/18 0216  WBC 17.4* 15.0*  HGB 10.7* 10.9*  HCT 33.5* 34.7*  PLT 461* 414*   BMET Recent Labs    12/04/18 0609 12/05/18 0216  NA 138 137  K 3.7 3.7  CL 104 106  CO2 26 22  GLUCOSE 129* 110*  BUN 13 11  CREATININE 0.80 0.75  CALCIUM 8.0* 8.1*   PT/INR No results for input(s): LABPROT, INR in the last 72 hours. CMP     Component Value Date/Time   NA 137 12/05/2018 0216   K 3.7 12/05/2018 0216   CL 106 12/05/2018 0216   CO2 22 12/05/2018 0216   GLUCOSE 110 (H) 12/05/2018 0216   BUN 11 12/05/2018 0216   CREATININE 0.75 12/05/2018 0216   CALCIUM 8.1 (L) 12/05/2018 0216   PROT 6.3 (L) 12/05/2018 0216   ALBUMIN 2.7 (L) 12/05/2018 0216   AST 20 12/05/2018 0216   ALT 30 12/05/2018 0216   ALKPHOS 45 12/05/2018 0216   BILITOT 0.8 12/05/2018 0216   GFRNONAA >60 12/05/2018 0216   GFRAA >60 12/05/2018 0216   Lipase     Component Value Date/Time   LIPASE 26 11/30/2018 1445       Studies/Results: Dg Abd 1 View  Result Date: 12/03/2018 CLINICAL DATA:  Abdominal distention. EXAM: ABDOMEN - 1 VIEW  COMPARISON:  None. FINDINGS: Multiple mild to moderately dilated small bowel loops are seen, with a paucity of colonic gas. This is suspicious for small bowel obstruction. IMPRESSION: Findings suspicious for small bowel obstruction. Electronically Signed   By: Marlaine Hind M.D.   On: 12/03/2018 08:57   Ct Abdomen Pelvis W Contrast  Result Date: 12/03/2018 CLINICAL DATA:  Mid abdominal pain with nausea and vomiting, gastric ulcer. EXAM: CT ABDOMEN AND PELVIS WITH CONTRAST TECHNIQUE: Multidetector CT imaging of the abdomen and pelvis was performed using the standard protocol following bolus administration of intravenous contrast. CONTRAST:  164mL OMNIPAQUE IOHEXOL 300 MG/ML  SOLN COMPARISON:  CT abdomen dated 11/30/2018. FINDINGS: Lower chest: Small bilateral pleural effusions, RIGHT slightly greater than LEFT, with associated passive atelectasis. Hepatobiliary: No focal liver abnormality is seen. No gallstones, gallbladder wall thickening, or biliary dilatation. Pancreas: Unremarkable. No pancreatic ductal dilatation or surrounding inflammatory changes. Spleen: Normal in size without focal abnormality. Adrenals/Urinary Tract: Adrenal glands appear normal. Mild RIGHT-sided hydronephrosis and moderate RIGHT-sided hydroureter. RIGHT periureteral inflammation/fluid stranding and thickening/enhancement of the walls of the proximal RIGHT ureter. LEFT kidney is unremarkable without mass, stone or hydronephrosis. LEFT ureter appears grossly normal. Bladder is decompressed by Foley catheter. Hyperdense material at the bladder base, contiguous  with the underlying prostate gland versus intraluminal bladder wall mass or blood products. Stomach/Bowel: The large bowel is decompressed throughout. Appendix is normal. Distal small bowel is decompressed. Remainder of the small bowel is mildly to moderately distended with fluid and air with ileus being more likely than mechanical obstruction given the lack of transition zone.  Stomach is also distended, moderate to severe in degree. Decreased thickening of the walls of the 2/3 portions of duodenum. The periduodenal fluid and/or phlegmonous change in the retroperitoneum is slightly less prominent. Vascular/Lymphatic: Aortic atherosclerosis. No acute appearing vascular abnormality. No enlarged lymph nodes seen in the abdomen or pelvis. Reproductive: Prostate gland is enlarged. Other: No circumscribed abscess collection seen. Small amount of free fluid in the lower pelvis. No free intraperitoneal air. Musculoskeletal: No acute or suspicious osseous finding. Degenerative spondylosis of the thoracolumbar spine, mild to moderate in degree. Anasarca within the subcutaneous soft tissues of the lower abdomen and pelvis. IMPRESSION: 1. Decreased thickening of the walls of the 2/3 portions of the duodenum suggesting interval improvement. The periduodenal fluid and/or phlegmonous change in the retroperitoneum is also slightly less prominent. No circumscribed abscess collection seen. No free intraperitoneal air to suggest perforation. 2. Stable mild RIGHT-sided hydronephrosis and moderate RIGHT-sided hydroureter, with associated RIGHT periureteral inflammation/fluid stranding and thickening/enhancement of the walls of the proximal RIGHT ureter. This is likely reactive to the adjacent periduodenal fluid and phlegmonous change. Cannot exclude secondary ureteral infection. 3. Bladder is decompressed by Foley catheter. Hyperdense material at the bladder base, contiguous with the underlying prostate gland versus intraluminal bladder wall mass or blood products. Recommend Urology consultation for possible cystoscopy. 4. Majority of the small bowel is mildly to moderately distended with fluid and air. Stomach is also distended, moderate to severe in degree. Findings are most suggestive of ileus, less likely small bowel obstruction without definable transition zone. 5. Anasarca within the subcutaneous soft  tissues of the lower abdomen and pelvis. 6. Small bilateral pleural effusions, RIGHT slightly greater than LEFT, with associated passive atelectasis. 7. Aortic atherosclerosis. Aortic Atherosclerosis (ICD10-I70.0). Electronically Signed   By: Franki Cabot M.D.   On: 12/03/2018 11:27    Anti-infectives: Anti-infectives (From admission, onward)   Start     Dose/Rate Route Frequency Ordered Stop   12/01/18 0400  piperacillin-tazobactam (ZOSYN) IVPB 2.25 g  Status:  Discontinued     2.25 g 100 mL/hr over 30 Minutes Intravenous Every 8 hours 11/30/18 1838 12/01/18 0204   12/01/18 0400  piperacillin-tazobactam (ZOSYN) IVPB 3.375 g     3.375 g 12.5 mL/hr over 240 Minutes Intravenous Every 8 hours 12/01/18 0204     11/30/18 1839  vancomycin variable dose per unstable renal function (pharmacist dosing)  Status:  Discontinued      Does not apply See admin instructions 11/30/18 1839 12/01/18 1257   11/30/18 1715  piperacillin-tazobactam (ZOSYN) IVPB 2.25 g     2.25 g 100 mL/hr over 30 Minutes Intravenous STAT 11/30/18 1708 11/30/18 1955   11/30/18 1715  vancomycin (VANCOCIN) 2,000 mg in sodium chloride 0.9 % 500 mL IVPB     2,000 mg 250 mL/hr over 120 Minutes Intravenous STAT 11/30/18 1711 11/30/18 2030   11/30/18 1700  metroNIDAZOLE (FLAGYL) IVPB 500 mg  Status:  Discontinued     500 mg 100 mL/hr over 60 Minutes Intravenous  Once 11/30/18 1657 11/30/18 1710   11/30/18 1700  vancomycin (VANCOCIN) IVPB 1000 mg/200 mL premix  Status:  Discontinued     1,000 mg 200 mL/hr over  60 Minutes Intravenous  Once 11/30/18 1657 11/30/18 1711       Assessment/Plan Duodenitis, unclear etiology   -ileus seems to be resolving.  Will DC NGT and given CLD  -duodenitis looked improved on recent CT scan.  No plans for surgical intervention  -mobilize!  PT consult ordered  -pulm toilet -WBC improving, down to 15K -keep K above 4 for ileus, replace again today  A Fib  Eliquis is on hold Urinary retention,  large prostate  Creatinine has returned to normal  Seen by Dr. Tresa Moore on 8/16 - to leave foley  FEN -CLD. IVFs VTE -SCDs ID -[email or web address]    LOS: 5 days    Henreitta Cea , [facility] 12/05/2018, 8:34 AM Pager: [phone]  Agree with above.  Will need to see GI at some point to consider upper endo.  He saw Dr. [name] in 11/2015 for colonoscopy.  His wife, Philip Jackson, is at the bedside.  Alphonsa Overall, MD, [facility] Pager: [phone] Office phone:  [phone]

## 2018-12-05 NOTE — Progress Notes (Signed)
PROGRESS NOTE    Philip Jackson  XLK:[id number] DOB: [birth year] DOA: 11/30/2018 PCP: Leonides Sake, MD   Brief Narrative: 68 year old with past medical history significant for A. fib on Eliquis, GERD, hypertension, OSA who presented complaining of acute onset progressively getting worse abdominal pain and distention since 2 days prior to admission.  Patient had a recent arthroplasty by Dr. Wynelle Link on 11/20/2018.  Patient has been also complaining of swelling in his right leg since surgery.  Patient presented with AKI with a creatinine of 4.2, hypoxemia, A. fib with RVR, signs of sepsis, CT abdomen showed possible duodenitis and perforation.  Surgery was consulted they are recommending conservative management. Patient renal function has improved, he is A. fib RVR has been controlled on IV Cardizem and IV metoprolol.  He does have paroxysmal A. fib and he has been converting back into A. fib in sinus rhythm during this hospitalization.  He was a started on IV heparin, while we have been holding Eliquis.  Patient third day of hospitalization developed worsening abdominal distention and worsening pain.  CT abdomen was repeated and he was diagnosed with an ileus.  Also CT mention right side hydronephrosis and a questionable bladder mass and enlarged prostate.  Patient was evaluated by urology who think that prostate enlargement is what we are seeing as a bladder mass.  Also patient will need to be discharged with Foley catheter   Assessment & Plan:   Principal Problem:   AKI (acute kidney injury) ([place]) Active Problems:   HTN (hypertension)   Atrial fibrillation with RVR (H[facility])   HLD (hyperlipidemia)   OSA treated with BiPAP   Duodenitis   Urine retention   Hyperkalemia    1-A fib RVR:  Likely related to acute illness.  Holding eliquis due to duodenitis, monitor for GI bleed and need for surgery.  Discussed with Dr Ninfa Linden plan to resume heparin gtt and monitor for sign of bleeding.   Continue with cardizem Gtt, IV metoprolol.  Continue with heparin.   2-Sepsis; patient presents with leukocytosis, tachycardia, tachypnea.  Related to intraabdominal process, contain perforation.  Blood culture no growth to date.  Continue with IV Zosyn. orthopedic does not have any concern with knee infection will discontinue vancomycin. Decreased IV fluids.  WBC on admission 43..  WBC continue to decrease today at 15.   3-AKI; presents with cr at 4. Could be related to obstructive uropathy, Hypovolemia, ACE, sepsis.  Has foley catheter, yielding 2 L urine.  Renal function improved. Decreased IV fluids.  CT abdomen showed Hydronephrosis, ? Bladder mass vs blood product. . Urology has been consulted. Mass might be related to enlarge prostate. Patient will need to follow up with urology outpatient for voiding trial.  Patient will need to be discharge with foley.  Urology is recommending for patient to be discharged on tamsulosin and finasteride. Stop oxybutynin.   4-Duodenitis; Perforation contained.  Ileus Discussed case with Dr Marcello Moores, she reviewed CT abdomen, she doesn't see free air. If he has perforation is contain.  Support care and monitor closely.  Appreciate surgery evaluation. Continue with  IV fluids, IV Protonix, IV antibiotics. Patient with worsening abdominal pain, distension. Found to have ileus.  CT abdomen with improvement duodenum thickening and decrease phlegmon. Ileus.  Plan to remove NG tube, start clears.   5-Acute hypoxic respiratory failure; related to intra-abdominal process, decrease lung compliance.  He report improvement of dyspnea.  Oxygen sat improved on 2 L.  Monitor.   6-Hyperkalemia;  Repeat b-met. He  received IV fluids.  Suspect related to AKI.  Resolved.  7-Hypokalemia;  Replete IV.   8-Diarrhea; Had Multiples BM.  C diff negative.   9-Hyponatremia; related to hypovolemia. IV fluids.  Improved.  10-Right knee, recent sx; redness and  gas on CT abdomen.  Orthopedic has been consulted.  Appreciate Dr. Wynelle Link evaluation.  No signs of infection.  11-hematuria, flushes ordered by urology.  Heparin on hold for 2 hours. Plan to resume heparin. Patient risk for stroke is high, with paroxysmal A fib.  Check hb tonight.   Estimated body mass index is 31.16 kg/m as calculated from the following:   Height as of this encounter: '5\' 9"'$  (1.753 m).   Weight as of this encounter: 95.7 kg.   DVT prophylaxis: scd Code Status: full code Family Communication: care discussed with wife 8/16 Disposition Plan: remain in the step down, for care of possible duodenal perforation, contain. AKI Consultants:   Surgery   Procedures:   none  Antimicrobials:    Subjective: He is feeling better, had multiples BM.  Abdominal distension improved  Objective: Vitals:   12/05/18 0700 12/05/18 0800 12/05/18 0900 12/05/18 1100  BP: (!) 153/96 (!) 165/105 (!) 168/94 (!) 148/108  Pulse: 83 87 89 100  Resp: '17 18 15 19  '$ Temp:  97.9 F (36.6 C)    TempSrc:  Oral    SpO2: 96% 97% 96% 97%  Weight:      Height:        Intake/Output Summary (Last 24 hours) at 12/05/2018 1204 Last data filed at 12/05/2018 0800 Gross per 24 hour  Intake 2015.22 ml  Output 4351 ml  Net -2335.78 ml   Filed Weights   11/30/18 2120 12/02/18 0500  Weight: 97.2 kg 95.7 kg    Examination:  General exam: NAD Respiratory system: CTA Cardiovascular system: S 1, S 2 RRR Gastrointestinal system: BS present, soft, distended, nt Central nervous system: Alert , non focal.  Extremities: Symmetric power  Skin: no rashes.   Data Reviewed: I have personally reviewed following labs and imaging studies  CBC: Recent Labs  Lab 11/30/18 1445 12/01/18 0221 12/02/18 0154 12/03/18 0435 12/04/18 0609 12/05/18 0216  WBC 43.8* 29.4* 21.4* 24.2* 17.4* 15.0*  NEUTROABS 33.7*  --   --   --   --   --   HGB 11.7* 11.0* 11.8* 11.7* 10.7* 10.9*  HCT 35.4* 34.8* 35.8*  36.3* 33.5* 34.7*  MCV 93.2 94.6 93.0 92.6 93.1 94.0  PLT 380 298 312 500* 461* 132*   Basic Metabolic Panel: Recent Labs  Lab 12/01/18 0221 12/02/18 0154 12/03/18 0435 12/04/18 0609 12/05/18 0216  NA 132* 138 137 138 137  K 4.2 3.8 3.1* 3.7 3.7  CL 100 106 103 104 106  CO2 20* 22 21* 26 22  GLUCOSE 99 92 142* 129* 110*  BUN 39* '19 16 13 11  '$ CREATININE 2.15* 0.91 0.91 0.80 0.75  CALCIUM 8.0* 7.9* 8.1* 8.0* 8.1*   GFR: Estimated Creatinine Clearance: 100.9 mL/min (by C-G formula based on SCr of 0.75 mg/dL). Liver Function Tests: Recent Labs  Lab 11/30/18 1445 12/03/18 0435 12/05/18 0216  AST 33 30 20  ALT 32 48* 30  ALKPHOS 58 53 45  BILITOT 1.4* 1.4* 0.8  PROT 7.0 6.3* 6.3*  ALBUMIN 3.3* 2.6* 2.7*   Recent Labs  Lab 11/30/18 1445  LIPASE 26   No results for input(s): AMMONIA in the last 168 hours. Coagulation Profile: No results for input(s): INR, PROTIME in the  last 168 hours. Cardiac Enzymes: No results for input(s): CKTOTAL, CKMB, CKMBINDEX, TROPONINI in the last 168 hours. BNP (last 3 results) No results for input(s): PROBNP in the last 8760 hours. HbA1C: No results for input(s): HGBA1C in the last 72 hours. CBG: No results for input(s): GLUCAP in the last 168 hours. Lipid Profile: No results for input(s): CHOL, HDL, LDLCALC, TRIG, CHOLHDL, LDLDIRECT in the last 72 hours. Thyroid Function Tests: No results for input(s): TSH, T4TOTAL, FREET4, T3FREE, THYROIDAB in the last 72 hours. Anemia Panel: No results for input(s): VITAMINB12, FOLATE, FERRITIN, TIBC, IRON, RETICCTPCT in the last 72 hours. Sepsis Labs: Recent Labs  Lab 11/30/18 1622 12/03/18 0825 12/03/18 1121  LATICACIDVEN 1.5 0.9 1.0    Recent Results (from the past 240 hour(s))  Blood culture (routine x 2)     Status: None   Collection Time: 11/30/18  4:12 PM   Specimen: BLOOD  Result Value Ref Range Status   Specimen Description   Final    BLOOD RIGHT ANTECUBITAL Performed at [facility] [address], [place], [place] [address]    Special Requests   Final    BOTTLES DRAWN AEROBIC ONLY Blood Culture adequate volume Performed at [facility] [address] [place], [place] [address]    Culture   Final    NO GROWTH 5 DAYS Performed at [facility], [place] [address] [place], [place] [address]    Report Status 12/05/2018 FINAL  Final  Blood culture (routine x 2)     Status: None   Collection Time: 11/30/18  4:12 PM   Specimen: BLOOD  Result Value Ref Range Status   Specimen Description   Final    BLOOD LEFT ANTECUBITAL Performed at [facility] [address] [place], [place] [address]    Special Requests   Final    BOTTLES DRAWN AEROBIC ONLY Blood Culture adequate volume Performed at [facility] [address], [place], [place] [address]    Culture   Final    NO GROWTH 5 DAYS Performed at [facility], [place] [address], [place], [place] [address]    Report Status 12/05/2018 FINAL  Final  SARS Coronavirus 2 [facility] order, Performed in [facility] hospital lab) Nasopharyngeal Nasopharyngeal Swab     Status: None   Collection Time: 11/30/18  5:54 PM   Specimen: Nasopharyngeal Swab  Result Value Ref Range Status   SARS Coronavirus 2 NEGATIVE NEGATIVE Final    Comment: (NOTE) If result is NEGATIVE SARS-CoV-2 target nucleic acids are NOT DETECTED. The SARS-CoV-2 RNA is generally detectable in upper and lower  respiratory specimens during the acute phase of infection. The lowest  concentration of SARS-CoV-2 viral copies this assay can detect is 250  copies / mL. A negative result does not preclude SARS-CoV-2 infection  and should not be used as the sole basis for treatment or other  patient management decisions.  A negative result may occur with  improper specimen collection / handling, submission of specimen other  than nasopharyngeal swab, presence of viral  mutation(s) within the  areas targeted by this assay, and inadequate number of viral copies  (<250 copies / mL). A negative result must be combined with clinical  observations, patient history, and epidemiological information. If result is POSITIVE SARS-CoV-2 target nucleic acids are DETECTED. The SARS-CoV-2 RNA is generally detectable in upper and lower  respiratory specimens dur ing the acute phase of infection.  Positive  results are indicative of active infection with SARS-CoV-2.  Clinical  correlation with patient history and other diagnostic information is  necessary to determine patient infection status.  Positive results do  not rule out bacterial infection or co-infection with other viruses. If result is PRESUMPTIVE POSTIVE SARS-CoV-2 nucleic acids MAY BE PRESENT.   A presumptive positive result was obtained on the submitted specimen  and confirmed on repeat testing.  While 2019 novel coronavirus  (SARS-CoV-2) nucleic acids may be present in the submitted sample  additional confirmatory testing may be necessary for epidemiological  and / or clinical management purposes  to differentiate between  SARS-CoV-2 and other Sarbecovirus currently known to infect humans.  If clinically indicated additional testing with an alternate test  methodology [phone]) is advised. The SARS-CoV-2 RNA is generally  detectable in upper and lower respiratory sp ecimens during the acute  phase of infection. The expected result is Negative. Fact Sheet for Patients:  StrictlyIdeas.no Fact Sheet for Healthcare Providers: BankingDealers.co.za This test is not yet approved or cleared by the [place] FDA and has been authorized for detection and/or diagnosis of SARS-CoV-2 by FDA under an Emergency Use Authorization (EUA).  This EUA will remain in effect (meaning this test can be used) for the duration of the COVID-19 declaration under Section 564(b)(1)  of the Act, 21 U.S.C. section 360bbb-3(b)(1), unless the authorization is terminated or revoked sooner. Performed at [facility], [place] [address] [place], [place] [address]   C difficile quick scan w PCR reflex     Status: None   Collection Time: 12/03/18  5:42 AM   Specimen: STOOL  Result Value Ref Range Status   C Diff antigen NEGATIVE NEGATIVE Final   C Diff toxin NEGATIVE NEGATIVE Final   C Diff interpretation No C. difficile detected.  Final    Comment: Performed at [facility], [place] [address] [place], [place] [address]  Urine Culture     Status: None   Collection Time: 12/03/18 12:13 PM   Specimen: Urine, Catheterized  Result Value Ref Range Status   Specimen Description   Final    URINE, CATHETERIZED Performed at [facility] [address], [place], [place] [address]    Special Requests   Final    Normal Performed at [facility], [place] [address], [place], [place] [address]    Culture   Final    NO GROWTH Performed at [facility], [place] [address], [place], [place] [address]    Report Status 12/04/2018 FINAL  Final         Radiology Studies: No results found.      Scheduled Meds: . Chlorhexidine Gluconate Cloth  6 each Topical Daily  . labetalol  10 mg Intravenous Once  . lip balm  1 application Topical BID  . mouth rinse  15 mL Mouth Rinse BID  . metoprolol tartrate  2.5 mg Intravenous Q8H  . pantoprazole (PROTONIX) IV  40 mg Intravenous Q12H  . potassium chloride  40 mEq Oral Daily   Continuous Infusions: . sodium chloride Stopped (12/02/18 0536)  . dextrose 5 % and 0.45 % NaCl with KCl 20 mEq/L 50 mL/hr at 12/05/18 0459  . heparin 1,900 Units/hr (12/04/18 2335)  . methocarbamol (ROBAXIN) IV    . piperacillin-tazobactam (ZOSYN)  IV 3.375 g (12/05/18 0650)     LOS: 5 days    Time spent: 35 minutes.     Elmarie Shiley, MD [facility] Pager  [phone]  If 7PM-7AM, please contact night-coverage [email or web address] Password [facility] 12/05/2018, 12:04 PM

## 2018-12-05 NOTE — Progress Notes (Signed)
[facility] for heparin Indication: atrial fibrillation - bridge therapy while eliquis on hold  Allergies  Allergen Reactions  . 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 Nsaids     Duodenal ulcer.  anticoagulated   Patient Measurements: Height: 5\' 9"  (175.3 cm) Weight: 210 lb 15.7 oz (95.7 kg) IBW/kg (Calculated) : 70.7 Heparin Dosing Weight: 91 kg  Vital Signs: Temp: 97.9 F (36.6 C) (08/18 1916) Temp Source: Oral (08/18 1916) BP: 158/96 (08/18 2200) Pulse Rate: 94 (08/18 2200)  Labs: Recent Labs    12/03/18 0435 12/03/18 2102 12/04/18 0609 12/04/18 1457 12/05/18 0216 12/05/18 1927 12/05/18 2153  HGB 11.7*  --  10.7*  --  10.9* 12.5*  --   HCT 36.3*  --  33.5*  --  34.7* 39.1  --   PLT 500*  --  461*  --  414*  --   --   APTT 57* 76*  --   --   --   --   --   HEPARINUNFRC 0.45 0.35 0.31 0.51 0.48  --  0.45  CREATININE 0.91  --  0.80  --  0.75  --   --    Estimated Creatinine Clearance: 100.9 mL/min (by C-G formula based on SCr of 0.75 mg/dL).  Assessment: 68 yo M on Eliquis PTA for Afib.  Admitted with duodenitis and possible RTP perforation.  Pharmacy consulted to start heparin bridge therapy while Eliquis on hold.   PTA Eliquis 5 mg po bid, last dose 8/13 at 07 am. Pt in AKI on admission that has now resolved.  Hg 11.8, PLTC 312 - stable, no bleeding reported.  8/16: Heparin drip was turned off this morning at 0810 AM by CCS for possible surgical intervention, resumed  8/17 Heparin stopped at 11am for blood in urine (has foley) Urology/TRH want Heparin continued.   Hep level 0.48 units/ml on 1900 units/hr Today, 8/18 2153 HL = 0.45 at goal, no infusion issues or additional bleeding reported by RN.   Goal of Therapy:  Heparin level 0.3-0.7 units/ml aPTT 66-102 seconds Monitor platelets by anticoagulation protocol: Yes   Plan:  Continue Heparin drip at 1900 units/hr Daily HL, CBC   Dorrene German 12/05/2018, 11:57 PM

## 2018-12-05 NOTE — Progress Notes (Signed)
Pt has declined use of CPAP QHS, machine remains at bedside ready for use.  RT to monitor and assess as needed.
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
## 2018-12-05 NOTE — Progress Notes (Signed)
OT Cancellation Note  Patient Details Name: Philip Jackson MRN: [id number] DOB: January 04, 1951   Cancelled Treatment:    Reason Eval/Treat Not Completed: Other (comment). Pt feels he is still able to do ADLs, toilet and shower transfer:  Did well with PT today.   Wife reports that pt is much much better.  Sariah Henkin 12/05/2018, 2:27 PM  Lesle Chris, OTR/L Acute Rehabilitation Services [phone] pager [phone] office 12/05/2018

## 2018-12-05 NOTE — Progress Notes (Addendum)
[place] for heparin Indication: atrial fibrillation - bridge therapy while eliquis on hold  Allergies  Allergen Reactions  . 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 Nsaids     Duodenal ulcer.  anticoagulated   Patient Measurements: Height: 5\' 9"  (175.3 cm) Weight: 210 lb 15.7 oz (95.7 kg) IBW/kg (Calculated) : 70.7 Heparin Dosing Weight: 91 kg  Vital Signs: Temp: 97.7 F (36.5 C) (08/18 0400) Temp Source: Oral (08/18 0400) BP: 153/96 (08/18 0700) Pulse Rate: 83 (08/18 0700)  Labs: Recent Labs    12/02/18 1809  12/03/18 0435 12/03/18 2102 12/04/18 0609 12/04/18 1457 12/05/18 0216  HGB  --    < > 11.7*  --  10.7*  --  10.9*  HCT  --   --  36.3*  --  33.5*  --  34.7*  PLT  --   --  500*  --  461*  --  414*  APTT 40*  --  57* 76*  --   --   --   HEPARINUNFRC 0.35  --  0.45 0.35 0.31 0.51 0.48  CREATININE  --   --  0.91  --  0.80  --  0.75   < > = values in this interval not displayed.   Estimated Creatinine Clearance: 100.9 mL/min (by C-G formula based on SCr of 0.75 mg/dL).  Assessment: 68 yo M on Eliquis PTA for Afib.  Admitted with duodenitis and possible RTP perforation.  Pharmacy consulted to start heparin bridge therapy while Eliquis on hold.   PTA Eliquis 5 mg po bid, last dose 8/13 at 07 am. Pt in AKI on admission that has now resolved.  Hg 11.8, PLTC 312 - stable, no bleeding reported.  8/16: Heparin drip was turned off this morning at 0810 AM by CCS for possible surgical intervention, resumed  Today, 12/05/2018 Hep level 0.48 units/ml on 1900 units/hr  Goal of Therapy:  Heparin level 0.3-0.7 units/ml aPTT 66-102 seconds Monitor platelets by anticoagulation protocol: Yes   Plan:  Continue Heparin drip at 1900 units/hr Daily HL, CBC  Minda Ditto PharmD Pager [phone] 12/05/2018, 7:39 AM   Addendum: Heparin stopped at 11am for blood in urine (has foley) Urology/TRH want Heparin continued.  Resume  same rate Heparin at 1900 units/hr Recheck Hep level at 2200  Minda Ditto PharmD Pager [phone] 12/05/2018, 3:40 PM

## 2018-12-06 ENCOUNTER — Ambulatory Visit: Payer: BC Managed Care – PPO | Admitting: Physical Therapy

## 2018-12-06 DIAGNOSIS — E875 Hyperkalemia: Secondary | ICD-10-CM

## 2018-12-06 DIAGNOSIS — K567 Ileus, unspecified: Secondary | ICD-10-CM

## 2018-12-06 DIAGNOSIS — R6520 Severe sepsis without septic shock: Secondary | ICD-10-CM

## 2018-12-06 DIAGNOSIS — R339 Retention of urine, unspecified: Secondary | ICD-10-CM

## 2018-12-06 DIAGNOSIS — M25561 Pain in right knee: Secondary | ICD-10-CM

## 2018-12-06 DIAGNOSIS — N9989 Other postprocedural complications and disorders of genitourinary system: Secondary | ICD-10-CM

## 2018-12-06 DIAGNOSIS — A419 Sepsis, unspecified organism: Secondary | ICD-10-CM

## 2018-12-06 DIAGNOSIS — R338 Other retention of urine: Secondary | ICD-10-CM

## 2018-12-06 DIAGNOSIS — M25461 Effusion, right knee: Secondary | ICD-10-CM

## 2018-12-06 LAB — BASIC METABOLIC PANEL
Anion gap: 7 (ref 5–15)
BUN: 10 mg/dL (ref 8–23)
CO2: 24 mmol/L (ref 22–32)
Calcium: 8.5 mg/dL — ABNORMAL LOW (ref 8.9–10.3)
Chloride: 107 mmol/L (ref 98–111)
Creatinine, Ser: 0.9 mg/dL (ref 0.61–1.24)
GFR calc Af Amer: >60 mL/min (ref >60)
GFR calc non Af Amer: >60 mL/min (ref >60)
Glucose, Bld: 113 mg/dL — ABNORMAL HIGH (ref 70–99)
Potassium: 4.4 mmol/L (ref 3.5–5.1)
Sodium: 138 mmol/L (ref 135–145)

## 2018-12-06 LAB — CBC
HCT: 37.8 % — ABNORMAL LOW (ref 39.0–52.0)
Hemoglobin: 12.1 g/dL — ABNORMAL LOW (ref 13.0–17.0)
MCH: 30 pg (ref 26.0–34.0)
MCHC: 32 g/dL (ref 30.0–36.0)
MCV: 93.6 fL (ref 80.0–100.0)
Platelets: 413 10*3/uL — ABNORMAL HIGH (ref 150–400)
RBC: 4.04 MIL/uL — ABNORMAL LOW (ref 4.22–5.81)
RDW: 12.9 % (ref 11.5–15.5)
WBC: 14.2 10*3/uL — ABNORMAL HIGH (ref 4.0–10.5)
nRBC: 0 % (ref 0.0–0.2)

## 2018-12-06 LAB — MAGNESIUM: Magnesium: 2 mg/dL (ref 1.7–2.4)

## 2018-12-06 LAB — HEPARIN LEVEL (UNFRACTIONATED): Heparin Unfractionated: 0.58 IU/mL (ref 0.30–0.70)

## 2018-12-06 MED ORDER — METOPROLOL TARTRATE 5 MG/5ML IV SOLN: 5.0000 mg | Freq: Once | INTRAVENOUS | Status: DC

## 2018-12-06 MED ORDER — METOPROLOL TARTRATE 5 MG/5ML IV SOLN
5.0000 mg | Freq: Three times a day (TID) | INTRAVENOUS | Status: AC
  Administered 2018-12-06 – 2018-12-07 (×5): 5 mg via INTRAVENOUS
  Filled 2018-12-06 (×5): qty 5

## 2018-12-06 MED ORDER — DIGOXIN 0.25 MG/ML IJ SOLN
0.5000 mg | Freq: Once | INTRAMUSCULAR | Status: AC
  Administered 2018-12-06: 05:00:00 0.5 mg via INTRAVENOUS
  Filled 2018-12-06: qty 2

## 2018-12-06 MED ORDER — APIXABAN 5 MG PO TABS
5.0000 mg | ORAL_TABLET | Freq: Two times a day (BID) | ORAL | Status: DC
  Administered 2018-12-06 – 2018-12-11 (×10): 5 mg via ORAL
  Filled 2018-12-06 (×10): qty 1

## 2018-12-06 MED ORDER — DILTIAZEM HCL 25 MG/5ML IV SOLN
10.0000 mg | Freq: Once | INTRAVENOUS | Status: AC
  Administered 2018-12-06: 10 mg via INTRAVENOUS
  Filled 2018-12-06: qty 5

## 2018-12-06 MED ORDER — SODIUM CHLORIDE 0.9 % IV BOLUS
500.0000 mL | Freq: Once | INTRAVENOUS | Status: AC
  Administered 2018-12-06: 05:00:00 250 mL via INTRAVENOUS

## 2018-12-06 MED ORDER — DILTIAZEM HCL 100 MG IV SOLR
5.0000 mg/h | INTRAVENOUS | Status: DC
  Administered 2018-12-06 (×2): 5 mg/h via INTRAVENOUS
  Administered 2018-12-06: 15 mg/h via INTRAVENOUS
  Filled 2018-12-06 (×3): qty 100

## 2018-12-06 MED ORDER — POTASSIUM CHLORIDE CRYS ER 20 MEQ PO TBCR
40.0000 meq | EXTENDED_RELEASE_TABLET | Freq: Every day | ORAL | Status: DC
  Administered 2018-12-06 – 2018-12-07 (×2): 40 meq via ORAL
  Filled 2018-12-06 (×2): qty 2

## 2018-12-06 NOTE — Progress Notes (Signed)
[place] for heparin Indication: atrial fibrillation - bridge therapy while eliquis on hold  Allergies  Allergen Reactions  . 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 Nsaids     Duodenal ulcer.  anticoagulated   Patient Measurements: Height: 5\' 9"  (175.3 cm) Weight: 210 lb 15.7 oz (95.7 kg) IBW/kg (Calculated) : 70.7 Heparin Dosing Weight: 91 kg  Vital Signs: Temp: 98.2 F (36.8 C) (08/19 0800) Temp Source: Oral (08/19 0800) BP: 125/76 (08/19 0700) Pulse Rate: 99 (08/19 0700)  Labs: Recent Labs    12/03/18 2102  12/04/18 0609  12/05/18 0216 12/05/18 1927 12/05/18 2153 12/06/18 0217  HGB  --    < > 10.7*  --  10.9* 12.5*  --  12.1*  HCT  --    < > 33.5*  --  34.7* 39.1  --  37.8*  PLT  --   --  461*  --  414*  --   --  413*  APTT 76*  --   --   --   --   --   --   --   HEPARINUNFRC 0.35  --  0.31   < > 0.48  --  0.45 0.58  CREATININE  --   --  0.80  --  0.75  --   --  0.90   < > = values in this interval not displayed.   Estimated Creatinine Clearance: 89.7 mL/min (by C-G formula based on SCr of 0.9 mg/dL).  Assessment: 68 yo M on Eliquis PTA for Afib.  Admitted with duodenitis and possible RTP perforation.  Pharmacy consulted to start heparin bridge therapy while Eliquis on hold.   PTA Eliquis 5 mg po bid, last dose 8/13 at 07 am. Pt in AKI on admission that has now resolved.  Hg 11.8, PLTC 312 - stable, no bleeding reported.  8/16: Heparin drip was turned off this morning at 0810 AM by CCS for possible surgical intervention, resumed 8/17: blood noted in foley bag, Hep stopped by RN, off x 3 hr, resume per Urology & TRH  Today, 12/06/2018 Hep level 0.58 units/ml on 1900 units/hr No blood in urine overnight, H/H stable, Plt 413 Adv to [place] diet  Goal of Therapy:  Heparin level 0.3-0.7 units/ml aPTT 66-102 seconds Monitor platelets by anticoagulation protocol: Yes   Plan:  Continue Heparin drip at 1900 units/hr Daily  HL, CBC  Minda Ditto PharmD Pager [phone] 12/06/2018, 8:34 AM

## 2018-12-06 NOTE — Progress Notes (Signed)
Pt continues to refuse CPAP QHS, machine remains ready at bedside with pt's home mask and tubing if he decides to wear.  RT to monitor and assess as needed.

## 2018-12-06 NOTE — Progress Notes (Signed)
Pharmacy: heparin --> Eliquis  Patient's a 68 y.o M with hx Afib on Eliquis PTA presented to the ED on 8/13 with c/o distended abdomen.  He was found to have duodenitis with suspected RP perforation.  Patient was transition to heparin drip on admission. To resume Eliquis back on 8/19 since no invasive intervention is planned for him at this time.  Plan: - d/c heparin drip - Eliquis 5 mg bid - monitor for s/s bleeding  Dia Sitter, PharmD, BCPS 12/06/2018 5:03 PM

## 2018-12-06 NOTE — Progress Notes (Signed)
Pt HR noted to be up in 190's post using BSC for a BM. Pt is calm,  BP stable, asymptomatic. Lopressor PRN given, provider paged, EKG completed. EKG showed AFIB RVR. HR slowly dropping with lopressor. Provider ordered digoxin, cardizem push also given. HR slowly decreasing to 110-120's. Still afib.
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
## 2018-12-06 NOTE — Progress Notes (Signed)
PROGRESS NOTE    Philip Jackson  EXH:[id number] DOB: [birth year] DOA: 11/30/2018 PCP: Leonides Sake, MD    Brief Narrative:  68 year old with past medical history significant for A. fib on Eliquis, GERD, hypertension, OSA who presented complaining of acute onset progressively getting worse abdominal pain and distention since 2 days prior to admission.  Patient had a recent arthroplasty by Dr. Wynelle Link on 11/20/2018.  Patient has been also complaining of swelling in his right leg since surgery.  Patient presented with AKI with a creatinine of 4.2, hypoxemia, A. fib with RVR, signs of sepsis, CT abdomen showed possible duodenitis and perforation.  Surgery was consulted they are recommending conservative management. Patient renal function has improved, he is A. fib RVR has been controlled on IV Cardizem and IV metoprolol.  He does have paroxysmal A. fib and he has been converting back into A. fib in sinus rhythm during this hospitalization.  He was a started on IV heparin, while we have been holding Eliquis.  Patient third day of hospitalization developed worsening abdominal distention and worsening pain.  CT abdomen was repeated and he was diagnosed with an ileus.  Also CT mention right side hydronephrosis and a questionable bladder mass and enlarged prostate.  Patient was evaluated by urology who think that prostate enlargement is what we are seeing as a bladder mass.  Also patient will need to be discharged with Foley catheter    Assessment & Plan:   Principal Problem:   AKI (acute kidney injury) ([place]) Active Problems:   HTN (hypertension)   Atrial fibrillation with RVR ([facility])   HLD (hyperlipidemia)   OSA treated with BiPAP   Duodenitis   Urine retention   Hyperkalemia  1 A. fib with RVR Likely secondary to acute illness of intra-abdominal process.  Patient with heart rates in the 170s.  Patient on Cardizem drip currently at 5 mics which will be uptitrated to 10 mics.  Increase IV  Lopressor to 5 mg IV every 6.  Patient on heparin.  Likely transition to Eliquis as no surgery is planned per general surgery.  2.  Sepsis Patient presented with concern for sepsis which met criteria due to leukocytosis, tachycardia, tachypnea.  Felt secondary to intra-abdominal process with possibly contained perforation.  Patient pancultured with no growth to date.  Patient was on IV vancomycin has been discontinued.  Patient on IV Zosyn.  Leukocytosis trending down.  WBC on admission was 43,000 and currently down to 14.2.  IV Zosyn has been discontinued per general surgery.  3.  Duodenitis/ileus Dr. Tyrell Antonio discussed case with Dr. Marcello Moores who reviewed CT abdomen and does not feel there is free air and if there was perforation was contained.  Patient improving clinically.  Repeat CT abdomen with improvement with duodenum thickening and decreasing phlegmon.  Ileus improving.  Patient tolerated clears and diet has been advanced to a full liquid diet per general surgery.  NG tube has been removed.  Continue IV Protonix, IV fluids, supportive care.  IV antibiotics discontinued per general surgery.  5.  Acute hypoxic respiratory failure Felt secondary to intra-abdominal process with decreased lung compliance.  Improved clinically.  Follow.  6.  Hyperkalemia Felt secondary to acute kidney injury.  Resolved.  7.  Hypokalemia Repleted.  8.  Acute renal failure Secondary to obstructive uropathy and hypovolemia in the setting of ACE inhibitor and sepsis.  Patient noted on admission to have a creatinine of 4.  CT abdomen and pelvis done concerning for hydronephrosis, bladder mass versus  blood product.  Urology consulted and was felt mass likely related to enlarged prostate.  Foley catheter placed with good urine output.  Renal output of 2.650 L over the past 24 hours.  Patient is -9.249 L during this hospitalization.  Creatinine down to -0.90.  Patient has been seen in consultation by urology who are  recommending discharging patient on Foley catheter and Flomax and Proscar with close outpatient follow-up for voiding trial and further evaluation.  Oxybutynin has been discontinued.  9.  Right knee swelling. Patient is status post recent surgery to the right knee.  Orthopedics was consulted and it was felt no signs of infection noted.  Outpatient follow-up.  10.  Hematuria Flushes ordered per urology.  Improved.  Heparin resumed.  H&H stable.  Outpatient follow-up.   DVT prophylaxis: Heparin Code Status: Full Family Communication: updated patient. No family at bedside. Disposition Plan: Remain in SDU.   Consultants:   General surgery: Dr. Leighton Ruff 1/69/6789  Urology: Dr. Tresa Moore 12/03/2018  Orthopedics: Dr.  Procedures  CT abdomen and pelvis 12/03/2018, 11/30/2018  Abdominal x-ray 12/03/2018  Chest x-ray 11/30/2018  Plain films of the right femur 11/30/2018  Plain films of the right knee 11/30/2018  Antimicrobials:   IV Zosyn 12/01/2018>>>>> 12/06/2018  IV vancomycin 11/30/2018>>>> 12/01/2018   Subjective: Patient laying in bed.  Denies any further nausea or emesis.  Tolerated clear liquids.  States improvement with abdominal distention and abdominal pain.  Denies any chest pain.  Denies any shortness of breath. Patient states having BMs and flatus.  Objective: Vitals:   12/06/18 0632 12/06/18 0700 12/06/18 0800 12/06/18 0900  BP:  125/76 119/71 120/80  Pulse: 100 99 (!) 104 (!) 46  Resp: '15 18 19 20  '$ Temp:   98.2 F (36.8 C)   TempSrc:   Oral   SpO2: 97% 97% 98% 98%  Weight:      Height:        Intake/Output Summary (Last 24 hours) at 12/06/2018 0949 Last data filed at 12/06/2018 0930 Gross per 24 hour  Intake 2302.01 ml  Output 3000 ml  Net -697.99 ml   Filed Weights   11/30/18 2120 12/02/18 0500  Weight: 97.2 kg 95.7 kg    Examination:  General exam: Appears calm and comfortable  Respiratory system: Clear to auscultation. Respiratory effort normal.  Cardiovascular system: Irregularly irregular.  No JVD, no murmurs, no rubs.  1-2+ right lower extremity edema. Gastrointestinal system: Abdomen is nondistended, soft and nontender.  Decreased breath sounds.  No organomegaly or masses felt. Normal bowel sounds heard. Central nervous system: Alert and oriented. No focal neurological deficits. Extremities: 1-2+ right lower extremity edema.  Surgical incision on right knee C/D/I.  Skin: No rashes, lesions or ulcers Psychiatry: Judgement and insight appear normal. Mood & affect appropriate.     Data Reviewed: I have personally reviewed following labs and imaging studies  CBC: Recent Labs  Lab 11/30/18 1445  12/02/18 0154 12/03/18 0435 12/04/18 0609 12/05/18 0216 12/05/18 1927 12/06/18 0217  WBC 43.8*   < > 21.4* 24.2* 17.4* 15.0*  --  14.2*  NEUTROABS 33.7*  --   --   --   --   --   --   --   HGB 11.7*   < > 11.8* 11.7* 10.7* 10.9* 12.5* 12.1*  HCT 35.4*   < > 35.8* 36.3* 33.5* 34.7* 39.1 37.8*  MCV 93.2   < > 93.0 92.6 93.1 94.0  --  93.6  PLT 380   < >  312 500* 461* 414*  --  413*   < > = values in this interval not displayed.   Basic Metabolic Panel: Recent Labs  Lab 12/02/18 0154 12/03/18 0435 12/04/18 0609 12/05/18 0216 12/06/18 0217  NA 138 137 138 137 138  K 3.8 3.1* 3.7 3.7 4.4  CL 106 103 104 106 107  CO2 22 21* '26 22 24  '$ GLUCOSE 92 142* 129* 110* 113*  BUN '19 16 13 11 10  '$ CREATININE 0.91 0.91 0.80 0.75 0.90  CALCIUM 7.9* 8.1* 8.0* 8.1* 8.5*  MG  --   --   --   --  2.0   GFR: Estimated Creatinine Clearance: 89.7 mL/min (by C-G formula based on SCr of 0.9 mg/dL). Liver Function Tests: Recent Labs  Lab 11/30/18 1445 12/03/18 0435 12/05/18 0216  AST 33 30 20  ALT 32 48* 30  ALKPHOS 58 53 45  BILITOT 1.4* 1.4* 0.8  PROT 7.0 6.3* 6.3*  ALBUMIN 3.3* 2.6* 2.7*   Recent Labs  Lab 11/30/18 1445  LIPASE 26   No results for input(s): AMMONIA in the last 168 hours. Coagulation Profile: No results for  input(s): INR, PROTIME in the last 168 hours. Cardiac Enzymes: No results for input(s): CKTOTAL, CKMB, CKMBINDEX, TROPONINI in the last 168 hours. BNP (last 3 results) No results for input(s): PROBNP in the last 8760 hours. HbA1C: No results for input(s): HGBA1C in the last 72 hours. CBG: No results for input(s): GLUCAP in the last 168 hours. Lipid Profile: No results for input(s): CHOL, HDL, LDLCALC, TRIG, CHOLHDL, LDLDIRECT in the last 72 hours. Thyroid Function Tests: No results for input(s): TSH, T4TOTAL, FREET4, T3FREE, THYROIDAB in the last 72 hours. Anemia Panel: No results for input(s): VITAMINB12, FOLATE, FERRITIN, TIBC, IRON, RETICCTPCT in the last 72 hours. Sepsis Labs: Recent Labs  Lab 11/30/18 1622 12/03/18 0825 12/03/18 1121  LATICACIDVEN 1.5 0.9 1.0    Recent Results (from the past 240 hour(s))  Blood culture (routine x 2)     Status: None   Collection Time: 11/30/18  4:12 PM   Specimen: BLOOD  Result Value Ref Range Status   Specimen Description   Final    BLOOD RIGHT ANTECUBITAL Performed at [facility] [address], [place], [place] [address]    Special Requests   Final    BOTTLES DRAWN AEROBIC ONLY Blood Culture adequate volume Performed at [facility] [address], [place], [place] [address]    Culture   Final    NO GROWTH 5 DAYS Performed at [facility], [place] [address], [place], [place] [address]    Report Status 12/05/2018 FINAL  Final  Blood culture (routine x 2)     Status: None   Collection Time: 11/30/18  4:12 PM   Specimen: BLOOD  Result Value Ref Range Status   Specimen Description   Final    BLOOD LEFT ANTECUBITAL Performed at [facility] [address], [place], [place] [address]    Special Requests   Final    BOTTLES DRAWN AEROBIC ONLY Blood Culture adequate volume Performed at [facility] [address], [place], [place] [address]     Culture   Final    NO GROWTH 5 DAYS Performed at [facility], [place] [address] [place], [place] [address]    Report Status 12/05/2018 FINAL  Final  SARS Coronavirus 2 [facility] order, Performed in [facility] hospital lab) Nasopharyngeal Nasopharyngeal Swab  Status: None   Collection Time: 11/30/18  5:54 PM   Specimen: Nasopharyngeal Swab  Result Value Ref Range Status   SARS Coronavirus 2 NEGATIVE NEGATIVE Final    Comment: (NOTE) If result is NEGATIVE SARS-CoV-2 target nucleic acids are NOT DETECTED. The SARS-CoV-2 RNA is generally detectable in upper and lower  respiratory specimens during the acute phase of infection. The lowest  concentration of SARS-CoV-2 viral copies this assay can detect is 250  copies / mL. A negative result does not preclude SARS-CoV-2 infection  and should not be used as the sole basis for treatment or other  patient management decisions.  A negative result may occur with  improper specimen collection / handling, submission of specimen other  than nasopharyngeal swab, presence of viral mutation(s) within the  areas targeted by this assay, and inadequate number of viral copies  (<250 copies / mL). A negative result must be combined with clinical  observations, patient history, and epidemiological information. If result is POSITIVE SARS-CoV-2 target nucleic acids are DETECTED. The SARS-CoV-2 RNA is generally detectable in upper and lower  respiratory specimens dur ing the acute phase of infection.  Positive  results are indicative of active infection with SARS-CoV-2.  Clinical  correlation with patient history and other diagnostic information is  necessary to determine patient infection status.  Positive results do  not rule out bacterial infection or co-infection with other viruses. If result is PRESUMPTIVE POSTIVE SARS-CoV-2 nucleic acids MAY BE PRESENT.   A presumptive positive result was obtained on the submitted specimen  and confirmed on  repeat testing.  While 2019 novel coronavirus  (SARS-CoV-2) nucleic acids may be present in the submitted sample  additional confirmatory testing may be necessary for epidemiological  and / or clinical management purposes  to differentiate between  SARS-CoV-2 and other Sarbecovirus currently known to infect humans.  If clinically indicated additional testing with an alternate test  methodology [phone]) is advised. The SARS-CoV-2 RNA is generally  detectable in upper and lower respiratory sp ecimens during the acute  phase of infection. The expected result is Negative. Fact Sheet for Patients:  StrictlyIdeas.no Fact Sheet for Healthcare Providers: BankingDealers.co.za This test is not yet approved or cleared by the [place] FDA and has been authorized for detection and/or diagnosis of SARS-CoV-2 by FDA under an Emergency Use Authorization (EUA).  This EUA will remain in effect (meaning this test can be used) for the duration of the COVID-19 declaration under Section 564(b)(1) of the Act, 21 U.S.C. section 360bbb-3(b)(1), unless the authorization is terminated or revoked sooner. Performed at [facility], [place] [address] [place], [place] [address]   C difficile quick scan w PCR reflex     Status: None   Collection Time: 12/03/18  5:42 AM   Specimen: STOOL  Result Value Ref Range Status   C Diff antigen NEGATIVE NEGATIVE Final   C Diff toxin NEGATIVE NEGATIVE Final   C Diff interpretation No C. difficile detected.  Final    Comment: Performed at [facility], [place] [address], [place], [place] [address]  Urine Culture     Status: None   Collection Time: 12/03/18 12:13 PM   Specimen: Urine, Catheterized  Result Value Ref Range Status   Specimen Description   Final    URINE, CATHETERIZED Performed at [facility] [address], [place], [place] [address]    Special Requests    Final    Normal Performed at [facility], [place] [address] [place], [place] [address]    Culture   Final    NO GROWTH Performed at [facility], [place] [address] [place], [place] [address]    Report Status 12/04/2018 FINAL  Final         Radiology Studies: No results found.      Scheduled Meds: . Chlorhexidine Gluconate Cloth  6 each Topical Daily  . labetalol  10 mg Intravenous Once  . lip balm  1 application Topical BID  . mouth rinse  15 mL Mouth Rinse BID  . metoprolol tartrate  5 mg Intravenous Q8H  . pantoprazole (PROTONIX) IV  40 mg Intravenous Q12H  . potassium chloride  40 mEq Oral Daily   Continuous Infusions: . sodium chloride Stopped (12/05/18 1504)  . dextrose 5 % and 0.45 % NaCl with KCl 20 mEq/L 50 mL/hr at 12/06/18 0930  . diltiazem (CARDIZEM) infusion 5 mg/hr (12/06/18 0900)  . heparin 1,900 Units/hr (12/06/18 0900)  . methocarbamol (ROBAXIN) IV       LOS: 6 days    Time spent: 40 minutes    Irine Seal, MD [facility]  If 7PM-7AM, please contact night-coverage [email or web address] 12/06/2018, 9:49 AM

## 2018-12-06 NOTE — Progress Notes (Signed)
Patient ID: Philip Jackson, male   DOB: [birth year], 68 y.o.   MRN: [id number]       Subjective: Still having diarrhea now.  No nausea with CLD.  Mobilized well yesterday.  Still in a fib  Objective: Vital signs in last 24 hours: Temp:  [97.9 F (36.6 C)-98.2 F (36.8 C)] 98.2 F (36.8 C) (08/19 0800) Pulse Rate:  [32-134] 99 (08/19 0700) Resp:  [12-25] 18 (08/19 0700) BP: (97-179)/(64-145) 125/76 (08/19 0700) SpO2:  [88 %-100 %] 97 % (08/19 0700) Last BM Date: 12/05/18  Intake/Output from previous day: 08/18 0701 - 08/19 0700 In: 2592.5 [P.O.:360; I.V.:1582.3; IV Piggyback:650.1] Out: 2650 [Urine:2650] Intake/Output this shift: No intake/output data recorded.  PE: Heart: irregular Abd: soft, minimally tender in RUQ, +BS, ND  Lab Results:  Recent Labs    12/05/18 0216 12/05/18 1927 12/06/18 0217  WBC 15.0*  --  14.2*  HGB 10.9* 12.5* 12.1*  HCT 34.7* 39.1 37.8*  PLT 414*  --  413*   BMET Recent Labs    12/05/18 0216 12/06/18 0217  NA 137 138  K 3.7 4.4  CL 106 107  CO2 22 24  GLUCOSE 110* 113*  BUN 11 10  CREATININE 0.75 0.90  CALCIUM 8.1* 8.5*   PT/INR No results for input(s): LABPROT, INR in the last 72 hours. CMP     Component Value Date/Time   NA 138 12/06/2018 0217   K 4.4 12/06/2018 0217   CL 107 12/06/2018 0217   CO2 24 12/06/2018 0217   GLUCOSE 113 (H) 12/06/2018 0217   BUN 10 12/06/2018 0217   CREATININE 0.90 12/06/2018 0217   CALCIUM 8.5 (L) 12/06/2018 0217   PROT 6.3 (L) 12/05/2018 0216   ALBUMIN 2.7 (L) 12/05/2018 0216   AST 20 12/05/2018 0216   ALT 30 12/05/2018 0216   ALKPHOS 45 12/05/2018 0216   BILITOT 0.8 12/05/2018 0216   GFRNONAA >60 12/06/2018 0217   GFRAA >60 12/06/2018 0217   Lipase     Component Value Date/Time   LIPASE 26 11/30/2018 1445       Studies/Results: No results found.  Anti-infectives: Anti-infectives (From admission, onward)   Start     Dose/Rate Route Frequency Ordered Stop   12/01/18 0400   piperacillin-tazobactam (ZOSYN) IVPB 2.25 g  Status:  Discontinued     2.25 g 100 mL/hr over 30 Minutes Intravenous Every 8 hours 11/30/18 1838 12/01/18 0204   12/01/18 0400  piperacillin-tazobactam (ZOSYN) IVPB 3.375 g  Status:  Discontinued     3.375 g 12.5 mL/hr over 240 Minutes Intravenous Every 8 hours 12/01/18 0204 12/06/18 0424   11/30/18 1839  vancomycin variable dose per unstable renal function (pharmacist dosing)  Status:  Discontinued      Does not apply See admin instructions 11/30/18 1839 12/01/18 1257   11/30/18 1715  piperacillin-tazobactam (ZOSYN) IVPB 2.25 g     2.25 g 100 mL/hr over 30 Minutes Intravenous STAT 11/30/18 1708 11/30/18 1955   11/30/18 1715  vancomycin (VANCOCIN) 2,000 mg in sodium chloride 0.9 % 500 mL IVPB     2,000 mg 250 mL/hr over 120 Minutes Intravenous STAT 11/30/18 1711 11/30/18 2030   11/30/18 1700  metroNIDAZOLE (FLAGYL) IVPB 500 mg  Status:  Discontinued     500 mg 100 mL/hr over 60 Minutes Intravenous  Once 11/30/18 1657 11/30/18 1710   11/30/18 1700  vancomycin (VANCOCIN) IVPB 1000 mg/200 mL premix  Status:  Discontinued     1,000 mg 200 mL/hr over 60  Minutes Intravenous  Once 11/30/18 1657 11/30/18 1711       Assessment/Plan Duodenitis, unclear etiology  -ileus resolving.  Tolerated CLD, adv to full liquids today -duodenitis looked improved on recent CT scan. No plans for surgical intervention -mobilize -pulm toilet -WBC improving, down to 14K -keep K above 4 for ileus, 4.4 today  A Fib Eliquis is on hold, but no plans for surgery, can resume from our standpoint  Urinary retention, large prostate Creatinine has returned to normal Seen by Dr. Tresa Moore on 8/16 - to leave foley  FEN - FLD VTE -SCDs, heparin gtt ID -[email or web address]    LOS: 6 days    Henreitta Cea , [facility] 12/06/2018, 8:17 AM Pager: [phone]

## 2018-12-06 NOTE — Progress Notes (Signed)
Pt HR inc to 120-150's. No symptoms noted per pt. Provider paged. Heart rhythm Afib. Will start Cardizem gtt per provider.

## 2018-12-07 DIAGNOSIS — R140 Abdominal distension (gaseous): Secondary | ICD-10-CM

## 2018-12-07 LAB — BASIC METABOLIC PANEL
Anion gap: 7 (ref 5–15)
BUN: 7 mg/dL — ABNORMAL LOW (ref 8–23)
CO2: 23 mmol/L (ref 22–32)
Calcium: 8.9 mg/dL (ref 8.9–10.3)
Chloride: 107 mmol/L (ref 98–111)
Creatinine, Ser: 0.73 mg/dL (ref 0.61–1.24)
GFR calc Af Amer: >60 mL/min (ref >60)
GFR calc non Af Amer: >60 mL/min (ref >60)
Glucose, Bld: 112 mg/dL — ABNORMAL HIGH (ref 70–99)
Potassium: 4.6 mmol/L (ref 3.5–5.1)
Sodium: 137 mmol/L (ref 135–145)

## 2018-12-07 LAB — MAGNESIUM: Magnesium: 2.1 mg/dL (ref 1.7–2.4)

## 2018-12-07 MED ORDER — METOPROLOL SUCCINATE ER 25 MG PO TB24: 50.0000 mg | ORAL_TABLET | Freq: Every day | ORAL | Status: DC

## 2018-12-07 MED ORDER — METHOCARBAMOL 500 MG PO TABS
500.0000 mg | ORAL_TABLET | Freq: Four times a day (QID) | ORAL | Status: DC | PRN
  Administered 2018-12-10: 500 mg via ORAL
  Filled 2018-12-07: qty 1

## 2018-12-07 MED ORDER — METOPROLOL SUCCINATE ER 50 MG PO TB24
75.0000 mg | ORAL_TABLET | Freq: Every day | ORAL | Status: DC
  Administered 2018-12-08 – 2018-12-11 (×4): 75 mg via ORAL
  Filled 2018-12-07 (×2): qty 3
  Filled 2018-12-07: qty 1
  Filled 2018-12-07: qty 3

## 2018-12-07 MED ORDER — DILTIAZEM HCL ER COATED BEADS 180 MG PO CP24
360.0000 mg | ORAL_CAPSULE | Freq: Every day | ORAL | Status: DC
  Administered 2018-12-07 – 2018-12-08 (×2): 360 mg via ORAL
  Filled 2018-12-07 (×2): qty 2

## 2018-12-07 MED ORDER — ROSUVASTATIN CALCIUM 20 MG PO TABS
20.0000 mg | ORAL_TABLET | Freq: Every day | ORAL | Status: DC
  Administered 2018-12-07 – 2018-12-10 (×4): 20 mg via ORAL
  Filled 2018-12-07 (×4): qty 1

## 2018-12-07 NOTE — Progress Notes (Signed)
°PROGRESS NOTE ° ° ° °Philip Jackson  MRN:[id number] DOB: 04/12/1951 DOA: 11/30/2018 °PCP: Hamrick, Maura L, MD  ° ° °Brief Narrative:  °68-year-old with past medical history significant for A. fib on Eliquis, GERD, hypertension, OSA who presented complaining of acute onset progressively getting worse abdominal pain and distention since 2 days prior to admission.  Patient had a recent arthroplasty by Dr. Aluisio on 11/20/2018.  Patient has been also complaining of swelling in his right leg since surgery. °  °Patient presented with AKI with a creatinine of 4.2, hypoxemia, A. fib with RVR, signs of sepsis, CT abdomen showed possible duodenitis and perforation.  Surgery was consulted they are recommending conservative management. °Patient renal function has improved, he is A. fib RVR has been controlled on IV Cardizem and IV metoprolol.  He does have paroxysmal A. fib and he has been converting back into A. fib in sinus rhythm during this hospitalization.  He was a started on IV heparin, while we have been holding Eliquis. °  °Patient third day of hospitalization developed worsening abdominal distention and worsening pain.  CT abdomen was repeated and he was diagnosed with an ileus.  Also CT mention right side hydronephrosis and a questionable bladder mass and enlarged prostate.  Patient was evaluated by urology who think that prostate enlargement is what we are seeing as a bladder mass.  Also patient will need to be discharged with Foley catheter °  ° ° °Assessment & Plan: °  °Principal Problem: °  AKI (acute kidney injury) (HCC) °Active Problems: °  HTN (hypertension) °  Atrial fibrillation with RVR (HCC) °  HLD (hyperlipidemia) °  OSA treated with BiPAP °  Duodenitis °  Urine retention °  Hyperkalemia °  Postoperative urinary retention °  Ileus (HCC) °  Sepsis with acute renal failure (HCC) °  Pain and swelling of right knee ° °1 A. fib with RVR °Likely secondary to acute illness of intra-abdominal process.  Patient  converted overnight to sinus rhythm.  Heart rate in the low 100s.  Cardizem drip has been turned off and we will discontinue.  Continue IV Lopressor 5 mg every 6 hours.  Resume home dose oral Cardizem.  Eliquis has been resumed.  Could likely transition back to home dose oral beta-blocker tomorrow.  ° °2.  Sepsis °Patient presented with concern for sepsis which met criteria due to leukocytosis, tachycardia, tachypnea.  Felt secondary to intra-abdominal process with possibly contained perforation.  Patient pancultured with no growth to date.  Patient was on IV vancomycin has been discontinued.  Patient on IV Zosyn.  Leukocytosis trending down.  WBC on admission was 43,000 and currently down to 14.2.  IV Zosyn has been discontinued per general surgery. ° °3.  Duodenitis/ileus °Dr. Regalado discussed case with Dr. Thomas who reviewed CT abdomen and does not feel there is free air and if there was perforation was contained.  Patient improving clinically.  Repeat CT abdomen with improvement with duodenum thickening and decreasing phlegmon.  Ileus improving.  Patient tolerated full liquid diet.  NG tube removed.  Patient transition to a soft diet today could likely transition from IV Protonix to oral Protonix tomorrow.  Saline lock IV fluids.  Continue supportive care.  IV antibiotics discontinued per general surgery.  Will need outpatient follow-up with gastroenterology. ° °5.  Acute hypoxic respiratory failure °Felt secondary to intra-abdominal process with decreased lung compliance.  Improved clinically.  Follow. ° °6.  Hyperkalemia °Felt secondary to acute kidney injury.  Resolved. ° °7.  Hypokalemia °  Hypokalemia Repleted.  8.  Acute renal failure Secondary to obstructive uropathy and hypovolemia in the setting of ACE inhibitor and sepsis.  Patient noted on admission to have a creatinine of 4.  CT abdomen and pelvis done concerning for hydronephrosis, bladder mass versus blood product.  Urology consulted and was felt mass  likely related to enlarged prostate.  Foley catheter placed with good urine output.  Urine output of 5.050 L over the past 24 hours.  Patient is -11.456 L during this hospitalization.  Creatinine down to 0.73.  Patient has been seen in consultation by urology who are recommending discharging patient on Foley catheter and Flomax and Proscar with close outpatient follow-up for voiding trial and further evaluation.  Oxybutynin has been discontinued.  9.  Right knee swelling. Patient is status post recent surgery to the right knee.  Orthopedics was consulted and it was felt no signs of infection noted.  Outpatient follow-up.  10.  Hematuria Flushes ordered per urology.  Improved.  Hemoglobin stable.  Patient transition from heparin to Eliquis.  Outpatient follow-up.   DVT prophylaxis: Eliquis Code Status: Full Family Communication: updated patient. No family at bedside. Disposition Plan: Likely transfer to telemetry if heart rate remained stable.   Consultants:   General surgery: Dr. Leighton Ruff 3/73/4287  Urology: Dr. Tresa Moore 12/03/2018  Orthopedics: Dr.  Procedures  CT abdomen and pelvis 12/03/2018, 11/30/2018  Abdominal x-ray 12/03/2018  Chest x-ray 11/30/2018  Plain films of the right femur 11/30/2018  Plain films of the right knee 11/30/2018  Antimicrobials:   IV Zosyn 12/01/2018>>>>> 12/06/2018  IV vancomycin 11/30/2018>>>> 12/01/2018   Subjective: Patient in bed.  Denies any nausea or emesis.  Tolerating full liquid diet.  Abdominal distention abdominal pain improved.  No shortness of breath.  Still having loose bowel movements however frequency decreasing per patient.  Positive flatus.  Patient converted into sinus rhythm overnight.  Cardizem drip discontinued.  Objective: Vitals:   12/06/18 2100 12/07/18 0013 12/07/18 0305 12/07/18 0800  BP:    (!) 152/92  Pulse: 79   89  Resp: 19   (!) 24  Temp:  98.6 F (37 C) 97.6 F (36.4 C) 98 F (36.7 C)  TempSrc:  Oral Oral  Oral  SpO2: 97%   96%  Weight:      Height:        Intake/Output Summary (Last 24 hours) at 12/07/2018 0930 Last data filed at 12/07/2018 0600 Gross per 24 hour  Intake 2054.5 ml  Output 4700 ml  Net -2645.5 ml   Filed Weights   11/30/18 2120 12/02/18 0500  Weight: 97.2 kg 95.7 kg    Examination:  General exam: Appears calm and comfortable  Respiratory system: Lungs clear to auscultation bilaterally.  No wheezes, no crackles, no rhonchi.  Normal respiratory effort. Cardiovascular system: Tachycardic.  No JVD, no murmurs, no rubs.  1-2+ right lower extremity edema.  Gastrointestinal system: Abdomen is soft, nontender, nondistended, positive bowel sounds.  No rebound.  No guarding.  Central nervous system: Alert and oriented. No focal neurological deficits. Extremities: 1-2+ right lower extremity edema.  Surgical incision on right knee C/D/I.  Skin: No rashes, lesions or ulcers Psychiatry: Judgement and insight appear normal. Mood & affect appropriate.     Data Reviewed: I have personally reviewed following labs and imaging studies  CBC: Recent Labs  Lab 11/30/18 1445  12/02/18 0154 12/03/18 0435 12/04/18 0609 12/05/18 0216 12/05/18 1927 12/06/18 0217  WBC 43.8*   < > 21.4* 24.2* 17.4*  15.0*  --  14.2*  NEUTROABS 33.7*  --   --   --   --   --   --   --   HGB 11.7*   < > 11.8* 11.7* 10.7* 10.9* 12.5* 12.1*  HCT 35.4*   < > 35.8* 36.3* 33.5* 34.7* 39.1 37.8*  MCV 93.2   < > 93.0 92.6 93.1 94.0  --  93.6  PLT 380   < > 312 500* 461* 414*  --  413*   < > = values in this interval not displayed.   Basic Metabolic Panel: Recent Labs  Lab 12/03/18 0435 12/04/18 0609 12/05/18 0216 12/06/18 0217 12/07/18 0223  NA 137 138 137 138 137  K 3.1* 3.7 3.7 4.4 4.6  CL 103 104 106 107 107  CO2 21* _0 GLUCOSE 142* 129* 110* 113* 112*  BUN _1 7*  CREATININE 0.91 0.80 0.75 0.90 0.73  CALCIUM 8.1* 8.0* 8.1* 8.5* 8.9  MG  --   --   --  2.0 2.1    GFR: Estimated Creatinine Clearance: 100.9 mL/min (by C-G formula based on SCr of 0.73 mg/dL). Liver Function Tests: Recent Labs  Lab 11/30/18 1445 12/03/18 0435 12/05/18 0216  AST 33 30 20  ALT 32 48* 30  ALKPHOS 58 53 45  BILITOT 1.4* 1.4* 0.8  PROT 7.0 6.3* 6.3*  ALBUMIN 3.3* 2.6* 2.7*   Recent Labs  Lab 11/30/18 1445  LIPASE 26   No results for input(s): AMMONIA in the last 168 hours. Coagulation Profile: No results for input(s): INR, PROTIME in the last 168 hours. Cardiac Enzymes: No results for input(s): CKTOTAL, CKMB, CKMBINDEX, TROPONINI in the last 168 hours. BNP (last 3 results) No results for input(s): PROBNP in the last 8760 hours. HbA1C: No results for input(s): HGBA1C in the last 72 hours. CBG: No results for input(s): GLUCAP in the last 168 hours. Lipid Profile: No results for input(s): CHOL, HDL, LDLCALC, TRIG, CHOLHDL, LDLDIRECT in the last 72 hours. Thyroid Function Tests: No results for input(s): TSH, T4TOTAL, FREET4, T3FREE, THYROIDAB in the last 72 hours. Anemia Panel: No results for input(s): VITAMINB12, FOLATE, FERRITIN, TIBC, IRON, RETICCTPCT in the last 72 hours. Sepsis Labs: Recent Labs  Lab 11/30/18 1622 12/03/18 0825 12/03/18 1121  LATICACIDVEN 1.5 0.9 1.0    Recent Results (from the past 240 hour(s))  Blood culture (routine x 2)     Status: None   Collection Time: 11/30/18  4:12 PM   Specimen: BLOOD  Result Value Ref Range Status   Specimen Description   Final    BLOOD RIGHT ANTECUBITAL Performed at [facility] [address], [place], [place] [address]    Special Requests   Final    BOTTLES DRAWN AEROBIC ONLY Blood Culture adequate volume Performed at [facility] [address], [place], [place] [address]    Culture   Final    NO GROWTH 5 DAYS Performed at [facility], [place] [address], [place], [place] [address]    Report Status 12/05/2018 FINAL  Final  Blood culture  (routine x 2)     Status: None   Collection Time: 11/30/18  4:12 PM   Specimen: BLOOD  Result Value Ref Range Status   Specimen Description   Final    BLOOD LEFT ANTECUBITAL Performed at [facility] [address], [place], [place] [address]    Special Requests   Final  AEROBIC ONLY Blood Culture adequate volume °Performed at [facility], [address] [place], [place] [address] °  ° Culture   Final  °  NO GROWTH 5 DAYS °Performed at [facility], [address] [place], [place] [address] °  ° Report Status 12/05/2018 FINAL  Final  °SARS Coronavirus 2 (Hospital order, Performed in  hospital lab) Nasopharyngeal Nasopharyngeal Swab     Status: None  ° Collection Time: 11/30/18  5:54 PM  ° Specimen: Nasopharyngeal Swab  °Result Value Ref Range Status  ° SARS Coronavirus 2 NEGATIVE NEGATIVE Final  °  Comment: (NOTE) °If result is NEGATIVE °SARS-CoV-2 target nucleic acids are NOT DETECTED. °The SARS-CoV-2 RNA is generally detectable in upper and lower  °respiratory specimens during the acute phase of infection. The lowest  °concentration of SARS-CoV-2 viral copies this assay can detect is 250  °copies / mL. A negative result does not preclude SARS-CoV-2 infection  °and should not be used as the sole basis for treatment or other  °patient management decisions.  A negative result may occur with  °improper specimen collection / handling, submission of specimen other  °than nasopharyngeal swab, presence of viral mutation(s) within the  °areas targeted by this assay, and inadequate number of viral copies  °(<250 copies / mL). A negative result must be combined with clinical  °observations, patient history, and epidemiological information. °If result is POSITIVE °SARS-CoV-2 target nucleic acids are DETECTED. °The SARS-CoV-2 RNA is generally detectable in upper and lower  °respiratory specimens dur °ing the acute phase of infection.  Positive   °results are indicative of active infection with SARS-CoV-2.  Clinical  °correlation with patient history and other diagnostic information is  °necessary to determine patient infection status.  Positive results do  °not rule out bacterial infection or co-infection with other viruses. °If result is PRESUMPTIVE POSTIVE °SARS-CoV-2 nucleic acids MAY BE PRESENT.   °A presumptive positive result was obtained on the submitted specimen  °and confirmed on repeat testing.  While 2019 novel coronavirus  °(SARS-CoV-2) nucleic acids may be present in the submitted sample  °additional confirmatory testing may be necessary for epidemiological  °and / or clinical management purposes  to differentiate between  °SARS-CoV-2 and other Sarbecovirus currently known to infect humans.  °If clinically indicated additional testing with an alternate test  °methodology ([id number]) is advised. The SARS-CoV-2 RNA is generally  °detectable in upper and lower respiratory sp °ecimens during the acute  °phase of infection. °The expected result is Negative. °Fact Sheet for Patients:  https://www.fda.gov/media/136312/download °Fact Sheet for Healthcare Providers: °https://www.fda.gov/media/136313/download °This test is not yet approved or cleared by the [place] FDA and °has been authorized for detection and/or diagnosis of SARS-CoV-2 by °FDA under an Emergency Use Authorization (EUA).  This EUA will remain °in effect (meaning this test can be used) for the duration of the °COVID-19 declaration under Section 564(b)(1) of the Act, 21 U.S.C. °section 360bbb-3(b)(1), unless the authorization is terminated or °revoked sooner. °Performed at [facility], [address] °[place], [place] [address] °  °C difficile quick scan w PCR reflex     Status: None  ° Collection Time: 12/03/18  5:42 AM  ° Specimen: STOOL  °Result Value Ref Range Status  ° C Diff antigen NEGATIVE NEGATIVE Final  ° C Diff toxin NEGATIVE NEGATIVE Final  ° C Diff  interpretation No C. difficile detected.  Final  °  Comment: Performed at [facility], [address] [place], [place] [address]  °Urine Culture     Status: None  ° Collection Time: 12/03/18 12:13 PM  ° Specimen: Urine, Catheterized  °Result Value Ref Range Status  ° Specimen Description   Final  °  URINE, CATHETERIZED °Performed at [facility], [address] [place], [place] [address] °  ° Special Requests   Final  °  Normal °Performed at [facility], [address] [place], [place] [address] °  ° Culture   Final  °  NO GROWTH °Performed at [facility], [address] [place], [place] [address] °  ° Report Status 12/04/2018 FINAL  Final  °  ° ° ° ° ° °Radiology Studies: °No results found. ° ° ° ° ° °Scheduled Meds: °• apixaban  5 mg Oral BID  °• Chlorhexidine Gluconate Cloth  6 each Topical Daily  °• diltiazem  360 mg Oral Daily  °• labetalol  10 mg Intravenous Once  °• lip balm  1 application Topical BID  °• mouth rinse  15 mL Mouth Rinse BID  °• metoprolol tartrate  5 mg Intravenous Q8H  °• pantoprazole (PROTONIX) IV  40 mg Intravenous Q12H  °• potassium chloride  40 mEq Oral Daily  ° °Continuous Infusions: °• sodium chloride Stopped (12/05/18 1504)  °• dextrose 5 % and 0.45 % NaCl with KCl 20 mEq/L 50 mL/hr at 12/06/18 1318  °• methocarbamol (ROBAXIN) IV    ° ° ° LOS: 7 days  ° ° °Time spent: 40 minutes ° ° ° °Daniel Thompson, MD °[facility] ° °If 7PM-7AM, please contact night-coverage [email or web address] °12/07/2018, 9:30 AM  °

## 2018-12-07 NOTE — Progress Notes (Signed)
Pt refused CPAP qhs.  Machine at bedside with his mask and tubing from home should he change his mind.

## 2018-12-07 NOTE — Progress Notes (Signed)
Patient ID: Philip Jackson, male   DOB: Jul 15, 1950, 68 y.o.   MRN: [id number]       Subjective: No issues.  Tolerating full liquids.  Bowels still working.  Objective: Vital signs in last 24 hours: Temp:  [97.6 F (36.4 C)-98.6 F (37 C)] 98 F (36.7 C) (08/20 0800) Pulse Rate:  [45-121] 89 (08/20 0800) Resp:  [16-24] 24 (08/20 0800) BP: (116-152)/(59-92) 152/92 (08/20 0800) SpO2:  [95 %-98 %] 96 % (08/20 0800) Last BM Date: 12/07/18  Intake/Output from previous day: 08/19 0701 - 08/20 0700 In: 2149.8 [P.O.:720; I.V.:1429.8] Out: 5050 [Urine:5050] Intake/Output this shift: No intake/output data recorded.  PE: Heart: regular, back in NSR Lungs: CTAB Abd: soft, NT, ND, +BS  Lab Results:  Recent Labs    12/05/18 0216 12/05/18 1927 12/06/18 0217  WBC 15.0*  --  14.2*  HGB 10.9* 12.5* 12.1*  HCT 34.7* 39.1 37.8*  PLT 414*  --  413*   BMET Recent Labs    12/06/18 0217 12/07/18 0223  NA 138 137  K 4.4 4.6  CL 107 107  CO2 24 23  GLUCOSE 113* 112*  BUN 10 7*  CREATININE 0.90 0.73  CALCIUM 8.5* 8.9   PT/INR No results for input(s): LABPROT, INR in the last 72 hours. CMP     Component Value Date/Time   NA 137 12/07/2018 0223   K 4.6 12/07/2018 0223   CL 107 12/07/2018 0223   CO2 23 12/07/2018 0223   GLUCOSE 112 (H) 12/07/2018 0223   BUN 7 (L) 12/07/2018 0223   CREATININE 0.73 12/07/2018 0223   CALCIUM 8.9 12/07/2018 0223   PROT 6.3 (L) 12/05/2018 0216   ALBUMIN 2.7 (L) 12/05/2018 0216   AST 20 12/05/2018 0216   ALT 30 12/05/2018 0216   ALKPHOS 45 12/05/2018 0216   BILITOT 0.8 12/05/2018 0216   GFRNONAA >60 12/07/2018 0223   GFRAA >60 12/07/2018 0223   Lipase     Component Value Date/Time   LIPASE 26 11/30/2018 1445       Studies/Results: No results found.  Anti-infectives: Anti-infectives (From admission, onward)   Start     Dose/Rate Route Frequency Ordered Stop   12/01/18 0400  piperacillin-tazobactam (ZOSYN) IVPB 2.25 g  Status:   Discontinued     2.25 g 100 mL/hr over 30 Minutes Intravenous Every 8 hours 11/30/18 1838 12/01/18 0204   12/01/18 0400  piperacillin-tazobactam (ZOSYN) IVPB 3.375 g  Status:  Discontinued     3.375 g 12.5 mL/hr over 240 Minutes Intravenous Every 8 hours 12/01/18 0204 12/06/18 0424   11/30/18 1839  vancomycin variable dose per unstable renal function (pharmacist dosing)  Status:  Discontinued      Does not apply See admin instructions 11/30/18 1839 12/01/18 1257   11/30/18 1715  piperacillin-tazobactam (ZOSYN) IVPB 2.25 g     2.25 g 100 mL/hr over 30 Minutes Intravenous STAT 11/30/18 1708 11/30/18 1955   11/30/18 1715  vancomycin (VANCOCIN) 2,000 mg in sodium chloride 0.9 % 500 mL IVPB     2,000 mg 250 mL/hr over 120 Minutes Intravenous STAT 11/30/18 1711 11/30/18 2030   11/30/18 1700  metroNIDAZOLE (FLAGYL) IVPB 500 mg  Status:  Discontinued     500 mg 100 mL/hr over 60 Minutes Intravenous  Once 11/30/18 1657 11/30/18 1710   11/30/18 1700  vancomycin (VANCOCIN) IVPB 1000 mg/200 mL premix  Status:  Discontinued     1,000 mg 200 mL/hr over 60 Minutes Intravenous  Once 11/30/18 1657 11/30/18 1711  Assessment/Plan Duodenitis, unclear etiology -adv to soft diet today -duodenitis looked improved on recent CT scan. No plans for surgical intervention -mobilize -pulm toilet -WBC improving yesterday, not checked today -keep K above 4 for ileus, 4.6  A Fib Eliquis resumed   FEN - soft diet VTE -SCDs, eliquis ID -[email or web address] on 8/19  Surgically patient stable and ok for DC home from our standpoint when medically stable.  Will need to follow up with GI in 6 weeks for an endoscopy to evaluate the duodenum.  Otherwise he does not need surgical follow up at this time.   LOS: 7 days    Henreitta Cea , [facility] 12/07/2018, 8:43 AM Pager: [phone]

## 2018-12-07 NOTE — Progress Notes (Signed)
Physical Therapy Treatment Patient Details Name: Philip Jackson MRN: [id number] DOB: [birth year] Today's Date: 12/07/2018    History of Present Illness 68 year old with past medical history significant for Philip Jackson on Eliquis, GERD, hypertension, OSA, Philip Jackson and Philip Jackson,  who presented complaining of acute onset progressively getting worse abdominal pain and distention since 2 days prior to admission.  Patient had a recent arthroplasty by Dr. Wynelle Link on 11/20/2018.  Pt admitted for Philip Jackson with RVR, signs of sepsis, CT abdomen showed possible duodenitis and perforation. Now with ileus.    PT Comments    Pt up to ambulate in hall and up to bathroom including performing hand hygiene standing at sink with min guard Sup assist.  Pt HR max at 130 while in bathroom.  Follow Up Recommendations  Outpatient PT     Equipment Recommendations  None recommended by PT    Recommendations for Other Services       Precautions / Restrictions Precautions Precautions: Fall Precaution Comments: independent SLRs Required Braces or Orthoses: Knee Immobilizer - Right Knee Immobilizer - Right: Discontinue once straight leg raise with < 10 degree lag Restrictions Weight Bearing Restrictions: No RLE Weight Bearing: Weight bearing as tolerated    Mobility  Bed Mobility Overal bed mobility: Needs Assistance Bed Mobility: Supine to Sit     Supine to sit: Min assist        Transfers Overall transfer level: Needs assistance Equipment used: Rolling walker (2 wheeled) Transfers: Sit to/from Stand Sit to Stand: Min guard         General transfer comment: Steady assist only; cues for LE management and use of UEs to self assist  Ambulation/Gait Ambulation/Gait assistance: Min guard;+2 safety/equipment Gait Distance (Feet): 400 Feet(and 15' back from bathroom) Assistive device: Rolling walker (2 wheeled) Gait Pattern/deviations: Step-to pattern;Step-through pattern;Shuffle;Trunk flexed     General Gait Details:  cues for posture, position from RW, and progression to step-through gait.     Stairs             Wheelchair Mobility    Modified Rankin (Stroke Patients Only)       Balance Overall balance assessment: Needs assistance Sitting-balance support: No upper extremity supported;Feet supported Sitting balance-Leahy Scale: Good     Standing balance support: Bilateral upper extremity supported Standing balance-Leahy Scale: Fair                              Cognition Arousal/Alertness: Awake/alert Behavior During Therapy: WFL for tasks assessed/performed Overall Cognitive Status: Within Functional Limits for tasks assessed                                 General Comments: flat affect, hx of Philip Jackson      Exercises      General Comments        Pertinent Vitals/Pain Pain Assessment: 0-10 Pain Score: 2  Pain Location: right knee Pain Descriptors / Indicators: Aching;Sore Pain Intervention(s): Limited activity within patient's tolerance;Monitored during session;Ice applied    Home Living                      Prior Function            PT Goals (current goals can now be found in the care plan section) Acute Rehab PT Goals Patient Stated Goal: return to independence with mobility PT Goal Formulation: With patient/family  Time For Goal Achievement: 12/15/18 Potential to Achieve Goals: Good Progress towards PT goals: Progressing toward goals    Frequency    Min 4X/week      PT Plan Current plan remains appropriate    Co-evaluation              AM-PAC PT "6 Clicks" Mobility   Outcome Measure  Help needed turning from your back to your side while in a flat bed without using bedrails?: [name] Help needed moving from lying on your back to sitting on the side of a flat bed without using bedrails?: [name] Help needed moving to and from a bed to a chair (including a wheelchair)?: [name] Help needed standing up from a  chair using your arms (e.g., wheelchair or bedside chair)?: [name] Help needed to walk in hospital room?: [name] Help needed climbing 3-5 steps with a railing? : [name] 6 Click Score: 18    End of Session Equipment Utilized During Treatment: Gait belt Activity Tolerance: Patient tolerated treatment well Patient left: in chair;with call bell/phone within reach;with chair alarm set Nurse Communication: Mobility status PT Visit Diagnosis: Difficulty in walking, not elsewhere classified (R26.2)     Time: 8206-0156 PT Time Calculation (min) (ACUTE ONLY): 31 min  Charges:  $Gait Training: 8-22 mins $Therapeutic Activity: 8-22 mins                     Philip Jackson Pager [phone] Office [phone]    Honora Searson 12/07/2018, 4:58 PM

## 2018-12-08 ENCOUNTER — Encounter: Payer: BC Managed Care – PPO | Admitting: Physical Therapy

## 2018-12-08 ENCOUNTER — Ambulatory Visit: Payer: BC Managed Care – PPO | Admitting: Physical Therapy

## 2018-12-08 DIAGNOSIS — K2980 Duodenitis without bleeding: Secondary | ICD-10-CM

## 2018-12-08 DIAGNOSIS — I10 Essential (primary) hypertension: Secondary | ICD-10-CM

## 2018-12-08 DIAGNOSIS — I4891 Unspecified atrial fibrillation: Secondary | ICD-10-CM

## 2018-12-08 DIAGNOSIS — I959 Hypotension, unspecified: Secondary | ICD-10-CM

## 2018-12-08 DIAGNOSIS — N179 Acute kidney failure, unspecified: Secondary | ICD-10-CM

## 2018-12-08 LAB — BASIC METABOLIC PANEL
Anion gap: 13 (ref 5–15)
BUN: 11 mg/dL (ref 8–23)
CO2: 19 mmol/L — ABNORMAL LOW (ref 22–32)
Calcium: 9.7 mg/dL (ref 8.9–10.3)
Chloride: 104 mmol/L (ref 98–111)
Creatinine, Ser: 0.75 mg/dL (ref 0.61–1.24)
GFR calc Af Amer: >60 mL/min (ref >60)
GFR calc non Af Amer: >60 mL/min (ref >60)
Glucose, Bld: 105 mg/dL — ABNORMAL HIGH (ref 70–99)
Potassium: 4.8 mmol/L (ref 3.5–5.1)
Sodium: 136 mmol/L (ref 135–145)

## 2018-12-08 LAB — CBC WITH DIFFERENTIAL/PLATELET
Abs Immature Granulocytes: 0.24 10*3/uL — ABNORMAL HIGH (ref 0.00–0.07)
Basophils Absolute: 0.1 10*3/uL (ref 0.0–0.1)
Basophils Relative: 0 %
Eosinophils Absolute: 0.2 10*3/uL (ref 0.0–0.5)
Eosinophils Relative: 1 %
HCT: 42.2 % (ref 39.0–52.0)
Hemoglobin: 13.4 g/dL (ref 13.0–17.0)
Immature Granulocytes: 2 %
Lymphocytes Relative: 20 %
Lymphs Abs: 2.6 10*3/uL (ref 0.7–4.0)
MCH: 30.1 pg (ref 26.0–34.0)
MCHC: 31.8 g/dL (ref 30.0–36.0)
MCV: 94.8 fL (ref 80.0–100.0)
Monocytes Absolute: 1.3 10*3/uL — ABNORMAL HIGH (ref 0.1–1.0)
Monocytes Relative: 10 %
Neutro Abs: 8.9 10*3/uL — ABNORMAL HIGH (ref 1.7–7.7)
Neutrophils Relative %: 67 %
Platelets: 468 10*3/uL — ABNORMAL HIGH (ref 150–400)
RBC: 4.45 MIL/uL (ref 4.22–5.81)
RDW: 13.3 % (ref 11.5–15.5)
WBC: 13.3 10*3/uL — ABNORMAL HIGH (ref 4.0–10.5)
nRBC: 0 % (ref 0.0–0.2)

## 2018-12-08 LAB — MAGNESIUM: Magnesium: 2 mg/dL (ref 1.7–2.4)

## 2018-12-08 MED ORDER — AMIODARONE LOAD VIA INFUSION
150.0000 mg | Freq: Once | INTRAVENOUS | Status: AC
  Administered 2018-12-08: 12:00:00 150 mg via INTRAVENOUS
  Filled 2018-12-08: qty 83.34

## 2018-12-08 MED ORDER — PANTOPRAZOLE SODIUM 40 MG PO TBEC
40.0000 mg | DELAYED_RELEASE_TABLET | Freq: Two times a day (BID) | ORAL | Status: DC
  Administered 2018-12-08 – 2018-12-11 (×7): 40 mg via ORAL
  Filled 2018-12-08 (×7): qty 1

## 2018-12-08 MED ORDER — TRAMADOL HCL 50 MG PO TABS
50.0000 mg | ORAL_TABLET | Freq: Four times a day (QID) | ORAL | Status: DC | PRN
  Administered 2018-12-09 – 2018-12-11 (×4): 50 mg via ORAL
  Filled 2018-12-08 (×6): qty 1

## 2018-12-08 MED ORDER — AMIODARONE HCL IN DEXTROSE 360-4.14 MG/200ML-% IV SOLN
30.0000 mg/h | INTRAVENOUS | Status: DC
  Administered 2018-12-08: 30 mg/h via INTRAVENOUS
  Filled 2018-12-08: qty 200

## 2018-12-08 MED ORDER — AMIODARONE HCL IN DEXTROSE 360-4.14 MG/200ML-% IV SOLN
60.0000 mg/h | INTRAVENOUS | Status: DC
  Administered 2018-12-08 (×2): 60 mg/h via INTRAVENOUS
  Filled 2018-12-08 (×2): qty 200

## 2018-12-08 MED ORDER — DILTIAZEM HCL 25 MG/5ML IV SOLN
10.0000 mg | Freq: Once | INTRAVENOUS | Status: AC
  Administered 2018-12-08: 10 mg via INTRAVENOUS
  Filled 2018-12-08: qty 5

## 2018-12-08 NOTE — Progress Notes (Signed)
Patient ID: Philip Jackson, male   DOB: 1951-02-05, 68 y.o.   MRN: [id number]       Subjective: No abdominal issues.  Tolerating soft diet with no issues.  Went back into a fib with RVR overnight.  Objective: Vital signs in last 24 hours: Temp:  [97.4 F (36.3 C)-98.5 F (36.9 C)] 98.1 F (36.7 C) (08/21 0800) Pulse Rate:  [65-100] 69 (08/21 0500) Resp:  [13-21] 21 (08/21 0600) BP: (122-145)/(66-96) 122/66 (08/21 0703) SpO2:  [91 %-98 %] 97 % (08/21 0600) Weight:  [84.5 kg] 84.5 kg (08/21 0500) Last BM Date: 12/08/18  Intake/Output from previous day: 08/20 0701 - 08/21 0700 In: 500 [I.V.:500] Out: 3680 [Urine:3680] Intake/Output this shift: No intake/output data recorded.  PE: Abd: soft, minimally tender in RUQ, +BS, ND  Lab Results:  Recent Labs    12/06/18 0217 12/08/18 0214  WBC 14.2* 13.3*  HGB 12.1* 13.4  HCT 37.8* 42.2  PLT 413* 468*   BMET Recent Labs    12/07/18 0223 12/08/18 0214  NA 137 136  K 4.6 4.8  CL 107 104  CO2 23 19*  GLUCOSE 112* 105*  BUN 7* 11  CREATININE 0.73 0.75  CALCIUM 8.9 9.7   PT/INR No results for input(s): LABPROT, INR in the last 72 hours. CMP     Component Value Date/Time   NA 136 12/08/2018 0214   K 4.8 12/08/2018 0214   CL 104 12/08/2018 0214   CO2 19 (L) 12/08/2018 0214   GLUCOSE 105 (H) 12/08/2018 0214   BUN 11 12/08/2018 0214   CREATININE 0.75 12/08/2018 0214   CALCIUM 9.7 12/08/2018 0214   PROT 6.3 (L) 12/05/2018 0216   ALBUMIN 2.7 (L) 12/05/2018 0216   AST 20 12/05/2018 0216   ALT 30 12/05/2018 0216   ALKPHOS 45 12/05/2018 0216   BILITOT 0.8 12/05/2018 0216   GFRNONAA >60 12/08/2018 0214   GFRAA >60 12/08/2018 0214   Lipase     Component Value Date/Time   LIPASE 26 11/30/2018 1445       Studies/Results: No results found.  Anti-infectives: Anti-infectives (From admission, onward)   Start     Dose/Rate Route Frequency Ordered Stop   12/01/18 0400  piperacillin-tazobactam (ZOSYN) IVPB 2.25 g   Status:  Discontinued     2.25 g 100 mL/hr over 30 Minutes Intravenous Every 8 hours 11/30/18 1838 12/01/18 0204   12/01/18 0400  piperacillin-tazobactam (ZOSYN) IVPB 3.375 g  Status:  Discontinued     3.375 g 12.5 mL/hr over 240 Minutes Intravenous Every 8 hours 12/01/18 0204 12/06/18 0424   11/30/18 1839  vancomycin variable dose per unstable renal function (pharmacist dosing)  Status:  Discontinued      Does not apply See admin instructions 11/30/18 1839 12/01/18 1257   11/30/18 1715  piperacillin-tazobactam (ZOSYN) IVPB 2.25 g     2.25 g 100 mL/hr over 30 Minutes Intravenous STAT 11/30/18 1708 11/30/18 1955   11/30/18 1715  vancomycin (VANCOCIN) 2,000 mg in sodium chloride 0.9 % 500 mL IVPB     2,000 mg 250 mL/hr over 120 Minutes Intravenous STAT 11/30/18 1711 11/30/18 2030   11/30/18 1700  metroNIDAZOLE (FLAGYL) IVPB 500 mg  Status:  Discontinued     500 mg 100 mL/hr over 60 Minutes Intravenous  Once 11/30/18 1657 11/30/18 1710   11/30/18 1700  vancomycin (VANCOCIN) IVPB 1000 mg/200 mL premix  Status:  Discontinued     1,000 mg 200 mL/hr over 60 Minutes Intravenous  Once 11/30/18 1657  11/30/18 1711       Assessment/Plan Duodenitis, unclear etiology -cont soft diet. -duodenitis looked improved on recent CT scan. No plans for surgical intervention -mobilize -pulm toilet -no further surgical needs at this time.  He will need GI follow up as outpatient for endoscopy in 6-8 weeks -we will sign off at this time.  Please call back if needed.  A Fib Eliquis resumed   FEN - soft diet VTE -SCDs, eliquis ID -[email or web address] on 8/19   LOS: 8 days    Henreitta Cea , [facility] 12/08/2018, 8:36 AM Pager: [phone]

## 2018-12-08 NOTE — Progress Notes (Signed)
PROGRESS NOTE    Philip Jackson  GHW:[id number] DOB: 10-Oct-1950 DOA: 11/30/2018 PCP: Leonides Sake, MD    Brief Narrative:  68 year old with past medical history significant for A. fib on Eliquis, GERD, hypertension, OSA who presented complaining of acute onset progressively getting worse abdominal pain and distention since 2 days prior to admission.  Patient had a recent arthroplasty by Dr. Wynelle Link on 11/20/2018.  Patient has been also complaining of swelling in his right leg since surgery.  Patient presented with AKI with a creatinine of 4.2, hypoxemia, A. fib with RVR, signs of sepsis, CT abdomen showed possible duodenitis and perforation.  Surgery was consulted they are recommending conservative management. Patient renal function has improved, he is A. fib RVR has been controlled on IV Cardizem and IV metoprolol.  He does have paroxysmal A. fib and he has been converting back into A. fib in sinus rhythm during this hospitalization.  He was a started on IV heparin, while we have been holding Eliquis.  Patient third day of hospitalization developed worsening abdominal distention and worsening pain.  CT abdomen was repeated and he was diagnosed with an ileus.  Also CT mention right side hydronephrosis and a questionable bladder mass and enlarged prostate.  Patient was evaluated by urology who think that prostate enlargement is what we are seeing as a bladder mass.  Also patient will need to be discharged with Foley catheter    Assessment & Plan:   Principal Problem:   AKI (acute kidney injury) ([place]) Active Problems:   HTN (hypertension)   Atrial fibrillation with RVR (HCC)   HLD (hyperlipidemia)   OSA treated with BiPAP   Duodenitis   Urine retention   Hyperkalemia   Postoperative urinary retention   Ileus (HCC)   Sepsis with acute renal failure (HCC)   Pain and swelling of right knee   Abdominal distension  1 A. fib with RVR Likely secondary to acute illness of intra-abdominal  process.  Patient converted to sinus rhythm 2 days ago.  Patient noted to have jumped back into A. fib overnight with heart rates going as high as the 200s per RN while in the bathroom.  Cardizem drip has been discontinued.  Scheduled IV Lopressor has been discontinued.  Patient now back on home dose oral Cardizem.  Home dose oral Toprol has been increased to 75 mg daily.  Eliquis has been resumed for anticoagulation.  Due to uncontrolled heart rates and patient going in and out of atrial fibrillation, cardiology has been consulted for further evaluation and management.  2.  Sepsis Patient presented with concern for sepsis which met criteria due to leukocytosis, tachycardia, tachypnea.  Felt secondary to intra-abdominal process with possibly contained perforation.  Patient pancultured with no growth to date.  Patient was on IV vancomycin has been discontinued.  Patient on IV Zosyn.  Leukocytosis trended down.  WBC on admission was 43,000 and currently down to 13.3.  IV Zosyn has been discontinued per general surgery.  3.  Duodenitis/ileus Dr. Tyrell Antonio discussed case with Dr. Marcello Moores who reviewed CT abdomen and does not feel there is free air and if there was perforation was contained.  Patient improving clinically.  Repeat CT abdomen with improvement with duodenum thickening and decreasing phlegmon.  Ileus improving.  Patient tolerated full liquid diet.  NG tube removed.  Patient transition to a soft diet which he is tolerating.  Change IV Protonix to oral PPI twice daily.  IV fluids have been saline locked.  IV antibiotics discontinued  per general surgery.  Will need outpatient follow-up with GI for endoscopy in 6 to 8 weeks.  Appreciate general surgery's input and recommendations.  General surgery signing off today 12/08/2018.   5.  Acute hypoxic respiratory failure Felt secondary to intra-abdominal process with decreased lung compliance.  Improved clinically.  Follow.  6.  Hyperkalemia Felt secondary to  acute kidney injury.  Resolved.  7.  Hypokalemia Repleted.  Discontinue daily potassium supplementation.  8.  Acute renal failure Secondary to obstructive uropathy and hypovolemia in the setting of ACE inhibitor and sepsis.  Patient noted on admission to have a creatinine of 4.  CT abdomen and pelvis done concerning for hydronephrosis, bladder mass versus blood product.  Urology consulted and was felt mass likely related to enlarged prostate.  Foley catheter placed with good urine output.  Urine output of 3.680 L over the past 24 hours.  Patient is -14.636 L during this hospitalization.  Creatinine down to 0.75.  Patient has been seen in consultation by urology who are recommending discharging patient on Foley catheter and Flomax and Proscar with close outpatient follow-up for voiding trial and further evaluation.  Oxybutynin has been discontinued.  9.  Right knee swelling. Patient is status post recent surgery to the right knee.  Orthopedics was consulted and it was felt no signs of infection noted.  Ultram as needed pain.  Outpatient follow-up.  10.  Hematuria Flushes ordered per urology.  Improved.  Hemoglobin stable.  Patient transitioned from heparin to Eliquis.  Outpatient follow-up.   DVT prophylaxis: Eliquis Code Status: Full Family Communication: updated patient. No family at bedside. Disposition Plan: Remain in stepdown unit as patient noted to be back in A. fib with heart rates going up high as the 200s in the bathroom.    Consultants:   General surgery: Dr. Leighton Ruff 10/16/5282  Urology: Dr. Tresa Moore 12/03/2018  Orthopedics:  Cardiology: Dr. Meda Coffee 12/08/2018  Procedures  CT abdomen and pelvis 12/03/2018, 11/30/2018  Abdominal x-ray 12/03/2018  Chest x-ray 11/30/2018  Plain films of the right femur 11/30/2018  Plain films of the right knee 11/30/2018  Antimicrobials:   IV Zosyn 12/01/2018>>>>> 12/06/2018  IV vancomycin 11/30/2018>>>> 12/01/2018   Subjective: Patient  in bed.  Patient noted to go back into A. fib overnight with heart rates jumping in the 200s and sustained in the 170s while in the bathroom.  Patient given dose of IV Lopressor with heart rate going down into the 90s.  Patient noted again this morning to have heart rates in the 150s.  Patient denies any chest pain.  No shortness of breath.  Abdominal distention improved.  Still having bowel movements with improvement with consistency.  Tolerating soft diet.   Objective: Vitals:   12/08/18 0500 12/08/18 0600 12/08/18 0703 12/08/18 0800  BP:   122/66   Pulse: 69     Resp: 13 (!) 21    Temp:    98.1 F (36.7 C)  TempSrc:    Oral  SpO2: 98% 97%    Weight: 84.5 kg     Height:        Intake/Output Summary (Last 24 hours) at 12/08/2018 0950 Last data filed at 12/08/2018 0500 Gross per 24 hour  Intake 500 ml  Output 3680 ml  Net -3180 ml   Filed Weights   11/30/18 2120 12/02/18 0500 12/08/18 0500  Weight: 97.2 kg 95.7 kg 84.5 kg    Examination:  General exam: Appears calm and comfortable  Respiratory system: Clear to auscultation  bilaterally.  No wheezes, no crackles, no rhonchi.  Normal respiratory effort.  Cardiovascular system: Irregularly irregular.  No JVD, no murmurs, no rubs.  Trace right lower extremity edema.  Gastrointestinal system: Abdomen is nontender, nondistended, soft, positive bowel sounds.  No rebound.  No guarding.  Central nervous system: Alert and oriented. No focal neurological deficits. Extremities: Trace right lower extremity edema.  Surgical incision on right knee C/D/I.  Skin: No rashes, lesions or ulcers Psychiatry: Judgement and insight appear normal. Mood & affect appropriate.     Data Reviewed: I have personally reviewed following labs and imaging studies  CBC: Recent Labs  Lab 12/03/18 0435 12/04/18 0609 12/05/18 0216 12/05/18 1927 12/06/18 0217 12/08/18 0214  WBC 24.2* 17.4* 15.0*  --  14.2* 13.3*  NEUTROABS  --   --   --   --   --  8.9*    HGB 11.7* 10.7* 10.9* 12.5* 12.1* 13.4  HCT 36.3* 33.5* 34.7* 39.1 37.8* 42.2  MCV 92.6 93.1 94.0  --  93.6 94.8  PLT 500* 461* 414*  --  413* 549*   Basic Metabolic Panel: Recent Labs  Lab 12/04/18 0609 12/05/18 0216 12/06/18 0217 12/07/18 0223 12/08/18 0214  NA 138 137 138 137 136  K 3.7 3.7 4.4 4.6 4.8  CL 104 106 107 107 104  CO2 _0 19*  GLUCOSE 129* 110* 113* 112* 105*  BUN _1 7* 11  CREATININE 0.80 0.75 0.90 0.73 0.75  CALCIUM 8.0* 8.1* 8.5* 8.9 9.7  MG  --   --  2.0 2.1 2.0   GFR: Estimated Creatinine Clearance: 88.4 mL/min (by C-G formula based on SCr of 0.75 mg/dL). Liver Function Tests: Recent Labs  Lab 12/03/18 0435 12/05/18 0216  AST 30 20  ALT 48* 30  ALKPHOS 53 45  BILITOT 1.4* 0.8  PROT 6.3* 6.3*  ALBUMIN 2.6* 2.7*   No results for input(s): LIPASE, AMYLASE in the last 168 hours. No results for input(s): AMMONIA in the last 168 hours. Coagulation Profile: No results for input(s): INR, PROTIME in the last 168 hours. Cardiac Enzymes: No results for input(s): CKTOTAL, CKMB, CKMBINDEX, TROPONINI in the last 168 hours. BNP (last 3 results) No results for input(s): PROBNP in the last 8760 hours. HbA1C: No results for input(s): HGBA1C in the last 72 hours. CBG: No results for input(s): GLUCAP in the last 168 hours. Lipid Profile: No results for input(s): CHOL, HDL, LDLCALC, TRIG, CHOLHDL, LDLDIRECT in the last 72 hours. Thyroid Function Tests: No results for input(s): TSH, T4TOTAL, FREET4, T3FREE, THYROIDAB in the last 72 hours. Anemia Panel: No results for input(s): VITAMINB12, FOLATE, FERRITIN, TIBC, IRON, RETICCTPCT in the last 72 hours. Sepsis Labs: Recent Labs  Lab 12/03/18 0825 12/03/18 1121  LATICACIDVEN 0.9 1.0    Recent Results (from the past 240 hour(s))  Blood culture (routine x 2)     Status: None   Collection Time: 11/30/18  4:12 PM   Specimen: BLOOD  Result Value Ref Range Status   Specimen Description   Final     BLOOD RIGHT ANTECUBITAL Performed at [facility] [address] [place], [place] [address]    Special Requests   Final    BOTTLES DRAWN AEROBIC ONLY Blood Culture adequate volume Performed at [facility] [address] [place], [place] [address]    Culture   Final    NO GROWTH 5 DAYS Performed at [facility], [place] [address],  [place], [place] [address]    Report Status 12/05/2018 FINAL  Final  Blood culture (routine x 2)     Status: None   Collection Time: 11/30/18  4:12 PM   Specimen: BLOOD  Result Value Ref Range Status   Specimen Description   Final    BLOOD LEFT ANTECUBITAL Performed at [facility] [address], [place], [place] [address]    Special Requests   Final    BOTTLES DRAWN AEROBIC ONLY Blood Culture adequate volume Performed at [facility] [address] [place], [place] [address]    Culture   Final    NO GROWTH 5 DAYS Performed at [facility], [place] [address] [place], [place] [address]    Report Status 12/05/2018 FINAL  Final  SARS Coronavirus 2 [facility] order, Performed in [facility] hospital lab) Nasopharyngeal Nasopharyngeal Swab     Status: None   Collection Time: 11/30/18  5:54 PM   Specimen: Nasopharyngeal Swab  Result Value Ref Range Status   SARS Coronavirus 2 NEGATIVE NEGATIVE Final    Comment: (NOTE) If result is NEGATIVE SARS-CoV-2 target nucleic acids are NOT DETECTED. The SARS-CoV-2 RNA is generally detectable in upper and lower  respiratory specimens during the acute phase of infection. The lowest  concentration of SARS-CoV-2 viral copies this assay can detect is 250  copies / mL. A negative result does not preclude SARS-CoV-2 infection  and should not be used as the sole basis for treatment or other  patient management decisions.  A negative result may occur with  improper specimen collection / handling, submission of specimen other   than nasopharyngeal swab, presence of viral mutation(s) within the  areas targeted by this assay, and inadequate number of viral copies  (<250 copies / mL). A negative result must be combined with clinical  observations, patient history, and epidemiological information. If result is POSITIVE SARS-CoV-2 target nucleic acids are DETECTED. The SARS-CoV-2 RNA is generally detectable in upper and lower  respiratory specimens dur ing the acute phase of infection.  Positive  results are indicative of active infection with SARS-CoV-2.  Clinical  correlation with patient history and other diagnostic information is  necessary to determine patient infection status.  Positive results do  not rule out bacterial infection or co-infection with other viruses. If result is PRESUMPTIVE POSTIVE SARS-CoV-2 nucleic acids MAY BE PRESENT.   A presumptive positive result was obtained on the submitted specimen  and confirmed on repeat testing.  While 2019 novel coronavirus  (SARS-CoV-2) nucleic acids may be present in the submitted sample  additional confirmatory testing may be necessary for epidemiological  and / or clinical management purposes  to differentiate between  SARS-CoV-2 and other Sarbecovirus currently known to infect humans.  If clinically indicated additional testing with an alternate test  methodology [phone]) is advised. The SARS-CoV-2 RNA is generally  detectable in upper and lower respiratory sp ecimens during the acute  phase of infection. The expected result is Negative. Fact Sheet for Patients:  StrictlyIdeas.no Fact Sheet for Healthcare Providers: BankingDealers.co.za This test is not yet approved or cleared by the [place] FDA and has been authorized for detection and/or diagnosis of SARS-CoV-2 by FDA under an Emergency Use Authorization (EUA).  This EUA will remain in effect (meaning this test can be used) for the duration of  the COVID-19 declaration under Section 564(b)(1) of the Act, 21 U.S.C. section 360bbb-3(b)(1), unless the authorization is terminated or revoked sooner. Performed at [facility], [place] [address], [place], [place] [address]   C difficile quick scan w PCR reflex     Status: None   Collection Time: 12/03/18  5:42 AM   Specimen: STOOL  Result Value Ref Range Status   C Diff antigen NEGATIVE NEGATIVE Final   C Diff toxin NEGATIVE NEGATIVE Final   C Diff interpretation No C. difficile detected.  Final    Comment: Performed at [facility], [place] [address], [place], [place] [address]  Urine Culture     Status: None   Collection Time: 12/03/18 12:13 PM   Specimen: Urine, Catheterized  Result Value Ref Range Status   Specimen Description   Final    URINE, CATHETERIZED Performed at [facility] [address] [place], [place] [address]    Special Requests   Final    Normal Performed at [facility], [place] [address], [place], [place] [address]    Culture   Final    NO GROWTH Performed at [facility], [place] [address] [place], [place] [address]    Report Status 12/04/2018 FINAL  Final         Radiology Studies: No results found.      Scheduled Meds:  apixaban  5 mg Oral BID   Chlorhexidine Gluconate Cloth  6 each Topical Daily   diltiazem  360 mg Oral Daily   labetalol  10 mg Intravenous Once   lip balm  1 application Topical BID   mouth rinse  15 mL Mouth Rinse BID   metoprolol succinate  75 mg Oral Daily   pantoprazole  40 mg Oral BID AC   rosuvastatin  20 mg Oral QHS   Continuous Infusions:    LOS: 8 days    Time spent: 40 minutes    Irine Seal, MD [facility]  If 7PM-7AM, please contact night-coverage [email or web address] 12/08/2018, 9:50 AM
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
## 2018-12-08 NOTE — Progress Notes (Signed)
Got pt up to use bathroom for a BM. While in the bathroom, HR jumped up in the 200's, and sustained 170+. Gave PRN lopressor. HR came down to the 90s in A.Fib rhythm. [facility] NP paged. Awaiting further orders. Will continue to closely monitor patient.

## 2018-12-08 NOTE — Progress Notes (Signed)
Pt. Refuses CPAP machine remains in room and is available if need arises.

## 2018-12-09 DIAGNOSIS — K631 Perforation of intestine (nontraumatic): Secondary | ICD-10-CM

## 2018-12-09 LAB — CBC
HCT: 41.3 % (ref 39.0–52.0)
Hemoglobin: 13 g/dL (ref 13.0–17.0)
MCH: 29.8 pg (ref 26.0–34.0)
MCHC: 31.5 g/dL (ref 30.0–36.0)
MCV: 94.7 fL (ref 80.0–100.0)
Platelets: 354 10*3/uL (ref 150–400)
RBC: 4.36 MIL/uL (ref 4.22–5.81)
RDW: 13.2 % (ref 11.5–15.5)
WBC: 14.1 10*3/uL — ABNORMAL HIGH (ref 4.0–10.5)
nRBC: 0 % (ref 0.0–0.2)

## 2018-12-09 LAB — BASIC METABOLIC PANEL
Anion gap: 10 (ref 5–15)
BUN: 19 mg/dL (ref 8–23)
CO2: 21 mmol/L — ABNORMAL LOW (ref 22–32)
Calcium: 9.1 mg/dL (ref 8.9–10.3)
Chloride: 104 mmol/L (ref 98–111)
Creatinine, Ser: 0.83 mg/dL (ref 0.61–1.24)
GFR calc Af Amer: >60 mL/min (ref >60)
GFR calc non Af Amer: >60 mL/min (ref >60)
Glucose, Bld: 128 mg/dL — ABNORMAL HIGH (ref 70–99)
Potassium: 4.1 mmol/L (ref 3.5–5.1)
Sodium: 135 mmol/L (ref 135–145)

## 2018-12-09 LAB — MAGNESIUM: Magnesium: 1.9 mg/dL (ref 1.7–2.4)
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
## 2018-12-09 NOTE — Progress Notes (Addendum)
Progress Note  Patient Name: Philip Jackson Date of Encounter: 12/09/2018  Primary Cardiologist: Belva Crome III, MD   Subjective   He cardioverted to SR the last night at 7 pm. Feels well, no SOB.  Inpatient Medications    Scheduled Meds: . apixaban  5 mg Oral BID  . Chlorhexidine Gluconate Cloth  6 each Topical Daily  . labetalol  10 mg Intravenous Once  . lip balm  1 application Topical BID  . mouth rinse  15 mL Mouth Rinse BID  . metoprolol succinate  75 mg Oral Daily  . pantoprazole  40 mg Oral BID AC  . rosuvastatin  20 mg Oral QHS   Continuous Infusions: . amiodarone Stopped (12/09/18 0019)   PRN Meds: albuterol, clobetasol cream, hydrALAZINE, magic mouthwash, menthol-cetylpyridinium, methocarbamol, metoCLOPramide (REGLAN) injection, metoprolol tartrate, ondansetron **OR** ondansetron (ZOFRAN) IV, phenol, polyvinyl alcohol, traMADol, zolpidem   Vital Signs    Vitals:   12/09/18 0700 12/09/18 0744 12/09/18 0800 12/09/18 0900  BP:   125/74   Pulse: 67  79 77  Resp: 12  16 19   Temp:  98.1 F (36.7 C)    TempSrc:  Oral    SpO2: 99%  96% 98%  Weight:      Height:        Intake/Output Summary (Last 24 hours) at 12/09/2018 0924 Last data filed at 12/09/2018 0400 Gross per 24 hour  Intake 368.84 ml  Output 1545 ml  Net -1176.16 ml   Last 3 Weights 12/09/2018 12/08/2018 12/02/2018  Weight (lbs) 189 lb 9.5 oz 186 lb 4.6 oz 210 lb 15.7 oz  Weight (kg) 86 kg 84.5 kg 95.7 kg      Telemetry    A-fib -->SR - Personally Reviewed  ECG    No new tracing - Personally Reviewed  Physical Exam  NAD GEN: No acute distress.   Neck: No JVD Cardiac: RRR, no murmurs, rubs, or gallops.  Respiratory: few crackles at bases bilaterally. GI: Soft, nontender, non-distended  MS: No edema; No deformity. Neuro:  Nonfocal  Psych: Normal affect   Labs    High Sensitivity Troponin:  No results for input(s): TROPONINIHS in the last 720 hours.   Cardiac EnzymesNo results for  input(s): TROPONINI in the last 168 hours. No results for input(s): TROPIPOC in the last 168 hours.   Chemistry Recent Labs  Lab 12/03/18 0435  12/05/18 0216  12/07/18 0223 12/08/18 0214 12/09/18 0209  NA 137   < > 137   < > 137 136 135  K 3.1*   < > 3.7   < > 4.6 4.8 4.1  CL 103   < > 106   < > 107 104 104  CO2 21*   < > 22   < > 23 19* 21*  GLUCOSE 142*   < > 110*   < > 112* 105* 128*  BUN 16   < > 11   < > 7* 11 19  CREATININE 0.91   < > 0.75   < > 0.73 0.75 0.83  CALCIUM 8.1*   < > 8.1*   < > 8.9 9.7 9.1  PROT 6.3*  --  6.3*  --   --   --   --   ALBUMIN 2.6*  --  2.7*  --   --   --   --   AST 30  --  20  --   --   --   --   ALT 48*  --  30  --   --   --   --   ALKPHOS 53  --  45  --   --   --   --   BILITOT 1.4*  --  0.8  --   --   --   --   GFRNONAA >60   < > >60   < > >60 >60 >60  GFRAA >60   < > >60   < > >60 >60 >60  ANIONGAP 13   < > 9   < > 7 13 10    < > = values in this interval not displayed.    Hematology Recent Labs  Lab 12/06/18 0217 12/08/18 0214 12/09/18 0209  WBC 14.2* 13.3* 14.1*  RBC 4.04* 4.45 4.36  HGB 12.1* 13.4 13.0  HCT 37.8* 42.2 41.3  MCV 93.6 94.8 94.7  MCH 30.0 30.1 29.8  MCHC 32.0 31.8 31.5  RDW 12.9 13.3 13.2  PLT 413* 468* 354   BNPNo results for input(s): BNP, PROBNP in the last 168 hours.   DDimer No results for input(s): DDIMER in the last 168 hours.   Radiology    No results found.  Cardiac Studies    Patient Profile     68 y.o. male Philip Jackson is a 68 y.o. male with a hx of paroxysmal atrial fibrillation on Eliquis, PSVT, sleep apnea on BiPap at hs, hypertension who is being seen today for the evaluation of atrial fibrillation with RVR at the request of Dr. Grandville Silos.  Assessment & Plan    Atrial fibrillation with RVR -Patient admitted with abdominal pain found to have duodenitis.  Not felt to require surgery. -Patient has a history of A. fib, monitor in June showed up to 44% A. fib with increased rates.  Patient also  has episodes of SVT and is planned for future EP evaluation by Dr. Lovena Le. -Patient was maintaining sinus rhythm then developed A. fib with RVR.  Patient was given IV Cardizem with not much improvement in rate.  Cardizem drip now discontinued and home Cardizem 360 mg resumed. -amiodarone was discontinued as he remains in SR Continue Toprol 75 mg daily and Eliquis 5 mg po BID  Hypertension -Continue holding cardizem as his BP is still soft, restart if he becomes hypertensive  Obstructive sleep apnea -Patient on BiPAP at home.  Should be continued while in the hospital.  For questions or updates, please contact [place] Please consult [email or web address] for contact info under        Signed, Ena Dawley, MD  12/09/2018, 9:24 AM

## 2018-12-09 NOTE — Progress Notes (Signed)
CPAP in room not in use, refuses to wear.  Will be available if needed or if patient changes his mind.

## 2018-12-09 NOTE — Progress Notes (Addendum)
PROGRESS NOTE    Philip Jackson  FBP:[id number] DOB: 1950-07-25 DOA: 11/30/2018 PCP: Leonides Sake, MD    Brief Narrative:  68 year old with past medical history significant for A. fib on Eliquis, GERD, hypertension, OSA who presented complaining of acute onset progressively getting worse abdominal pain and distention since 2 days prior to admission.  Patient had a recent arthroplasty by Dr. Wynelle Link on 11/20/2018.  Patient has been also complaining of swelling in his right leg since surgery.  Patient presented with AKI with a creatinine of 4.2, hypoxemia, A. fib with RVR, signs of sepsis, CT abdomen showed possible duodenitis and perforation.  Surgery was consulted they are recommending conservative management. Patient renal function has improved, he is A. fib RVR has been controlled on IV Cardizem and IV metoprolol.  He does have paroxysmal A. fib and he has been converting back into A. fib in sinus rhythm during this hospitalization.  He was a started on IV heparin, while we have been holding Eliquis.  Patient third day of hospitalization developed worsening abdominal distention and worsening pain.  CT abdomen was repeated and he was diagnosed with an ileus.  Also CT mention right side hydronephrosis and a questionable bladder mass and enlarged prostate.  Patient was evaluated by urology who think that prostate enlargement is what we are seeing as a bladder mass.  Also patient will need to be discharged with Foley catheter    Assessment & Plan:   Principal Problem:   AKI (acute kidney injury) ([place]) Active Problems:   HTN (hypertension)   Atrial fibrillation with RVR (HCC)   HLD (hyperlipidemia)   OSA treated with BiPAP   Duodenitis   Urine retention   Hyperkalemia   Postoperative urinary retention   Ileus (HCC)   Sepsis with acute renal failure (HCC)   Pain and swelling of right knee   Abdominal distension   Duodenal perforation (HCC)  1 A. fib with RVR Likely secondary to acute  illness of intra-abdominal process.  Patient converted to sinus rhythm 2 days ago.  Patient noted to have jumped back into A. fib the night of 12/07/2018, with heart rates going as high as the 200s per RN while in the bathroom.  Cardizem drip has been discontinued.  Scheduled IV Lopressor has been discontinued.  Patient was back on home dose oral Cardizem.  Home dose oral Toprol has been increased to 75 mg daily.  Eliquis has been resumed for anticoagulation.  Due to uncontrolled heart rates and patient going in and out of atrial fibrillation, cardiology was consulted.  Patient was started on an amiodarone drip on 12/08/2018.  Patient converted to normal sinus rhythm last night 12/08/2018 around 7:42 PM with heart rates controlled.  Amiodarone drip has been discontinued.  Per cardiology continue Toprol-XL now and once blood pressure has improved could resume back on Cardizem CD started on 120 mg daily.  Outpatient follow-up with cardiology.  Appreciate cardiology input and recommendations.  2.  Sepsis Patient presented with concern for sepsis which met criteria due to leukocytosis, tachycardia, tachypnea.  Felt secondary to intra-abdominal process with possibly contained perforation.  Patient pancultured with no growth to date.  Patient was on IV vancomycin has been discontinued.  Patient on IV Zosyn.  Leukocytosis trended down.  WBC on admission was 43,000 and currently seems to be stabilizing down to 14.1.  IV Zosyn has been discontinued per general surgery.  3.  Duodenitis/ileus Dr. Tyrell Antonio discussed case with Dr. Marcello Moores who reviewed CT abdomen and does  not feel there is free air and if there was perforation was contained.  Patient improving clinically.  Repeat CT abdomen with improvement with duodenum thickening and decreasing phlegmon.  Ileus improving.  Patient tolerated full liquid diet.  NG tube removed.  Patient transition to a soft diet which he is tolerating.  Change IV Protonix to oral PPI twice  daily.  IV fluids have been saline locked.  IV antibiotics discontinued per general surgery.  Will need outpatient follow-up with GI for endoscopy in 6 to 8 weeks.  Appreciate general surgery's input and recommendations.  General surgery signed off on 12/08/2018.   5.  Acute hypoxic respiratory failure Felt secondary to intra-abdominal process with decreased lung compliance.  Improved clinically.  Currently satting 99% on room air.  Follow.  6.  Hyperkalemia Felt secondary to acute kidney injury.  Resolved.  7.  Hypokalemia Repleted.  Discontinued daily potassium supplementation.  8.  Acute renal failure Secondary to obstructive uropathy and hypovolemia in the setting of ACE inhibitor and sepsis.  Patient noted on admission to have a creatinine of 4.  CT abdomen and pelvis done concerning for hydronephrosis, bladder mass versus blood product.  Urology consulted and was felt mass likely related to enlarged prostate.  Foley catheter placed with good urine output.  Urine output of 1.545 L over the past 24 hours.  Patient is -15.812 L during this hospitalization.  Creatinine down to 0.83.  Patient has been seen in consultation by urology who are recommending discharging patient on Foley catheter and Flomax and Proscar with close outpatient follow-up for voiding trial and further evaluation.  Oxybutynin has been discontinued.  9.  Right knee swelling. Patient is status post recent surgery to the right knee.  Orthopedics was consulted and it was felt no signs of infection noted.  Ultram as needed pain.  Outpatient follow-up.  10.  Hematuria Flushes ordered per urology.  Improved.  Hemoglobin stable.  Patient transitioned from heparin to Eliquis.  Outpatient follow-up.  11.  OSA CPAP nightly however per epic notes patient has been refusing.   DVT prophylaxis: Eliquis Code Status: Full Family Communication: updated patient. No family at bedside. Disposition Plan: Transfer to telemetry.  Likely home  if heart rate remains controlled.   Consultants:   General surgery: Dr. Leighton Ruff 6/75/4492  Urology: Dr. Tresa Moore 12/03/2018  Orthopedics:  Cardiology: Dr. Meda Coffee 12/08/2018  Procedures  CT abdomen and pelvis 12/03/2018, 11/30/2018  Abdominal x-ray 12/03/2018  Chest x-ray 11/30/2018  Plain films of the right femur 11/30/2018  Plain films of the right knee 11/30/2018  Antimicrobials:   IV Zosyn 12/01/2018>>>>> 12/06/2018  IV vancomycin 11/30/2018>>>> 12/01/2018   Subjective: Patient sitting up in chair just finished eating his breakfast and watching television.  Feels better.  Denies any chest pain or shortness of breath.  Noted to have converted back to normal sinus rhythm around 7:42 PM last night.  Denies any abdominal pain, no abdominal distention.  Stated had 1 stool overnight.  Tolerating current diet.  Objective: Vitals:   12/09/18 0700 12/09/18 0744 12/09/18 0800 12/09/18 0900  BP:   125/74   Pulse: 67  79 77  Resp: _0 Temp:  98.1 F (36.7 C)    TempSrc:  Oral    SpO2: 99%  96% 98%  Weight:      Height:        Intake/Output Summary (Last 24 hours) at 12/09/2018 0940 Last data filed at 12/09/2018 0400 Gross per 24 hour  Intake 368.84 ml  Output 1545 ml  Net -1176.16 ml   Filed Weights   12/02/18 0500 12/08/18 0500 12/09/18 0311  Weight: 95.7 kg 84.5 kg 86 kg    Examination:  General exam: NAD Respiratory system: CTA B.  No wheezes, no crackles, no rhonchi.  Normal respiratory effort.  Cardiovascular system: Regular rate rhythm no murmurs rubs or gallops.  No JVD.  Trace right lower extremity edema.  Gastrointestinal system: Abdomen is soft, nontender, nondistended, positive bowel sounds.  No rebound.  No guarding.  Central nervous system: Alert and oriented. No focal neurological deficits. Extremities: Trace right lower extremity edema.  Surgical incision on right knee C/D/I.  Skin: No rashes, lesions or ulcers Psychiatry: Judgement and insight  appear normal. Mood & affect appropriate.     Data Reviewed: I have personally reviewed following labs and imaging studies  CBC: Recent Labs  Lab 12/04/18 0609 12/05/18 0216 12/05/18 1927 12/06/18 0217 12/08/18 0214 12/09/18 0209  WBC 17.4* 15.0*  --  14.2* 13.3* 14.1*  NEUTROABS  --   --   --   --  8.9*  --   HGB 10.7* 10.9* 12.5* 12.1* 13.4 13.0  HCT 33.5* 34.7* 39.1 37.8* 42.2 41.3  MCV 93.1 94.0  --  93.6 94.8 94.7  PLT 461* 414*  --  413* 468* 294   Basic Metabolic Panel: Recent Labs  Lab 12/05/18 0216 12/06/18 0217 12/07/18 0223 12/08/18 0214 12/09/18 0209  NA 137 138 137 136 135  K 3.7 4.4 4.6 4.8 4.1  CL 106 107 107 104 104  CO2 _0 19* 21*  GLUCOSE 110* 113* 112* 105* 128*  BUN 11 10 7* 11 19  CREATININE 0.75 0.90 0.73 0.75 0.83  CALCIUM 8.1* 8.5* 8.9 9.7 9.1  MG  --  2.0 2.1 2.0 1.9   GFR: Estimated Creatinine Clearance: 92.5 mL/min (by C-G formula based on SCr of 0.83 mg/dL). Liver Function Tests: Recent Labs  Lab 12/03/18 0435 12/05/18 0216  AST 30 20  ALT 48* 30  ALKPHOS 53 45  BILITOT 1.4* 0.8  PROT 6.3* 6.3*  ALBUMIN 2.6* 2.7*   No results for input(s): LIPASE, AMYLASE in the last 168 hours. No results for input(s): AMMONIA in the last 168 hours. Coagulation Profile: No results for input(s): INR, PROTIME in the last 168 hours. Cardiac Enzymes: No results for input(s): CKTOTAL, CKMB, CKMBINDEX, TROPONINI in the last 168 hours. BNP (last 3 results) No results for input(s): PROBNP in the last 8760 hours. HbA1C: No results for input(s): HGBA1C in the last 72 hours. CBG: No results for input(s): GLUCAP in the last 168 hours. Lipid Profile: No results for input(s): CHOL, HDL, LDLCALC, TRIG, CHOLHDL, LDLDIRECT in the last 72 hours. Thyroid Function Tests: No results for input(s): TSH, T4TOTAL, FREET4, T3FREE, THYROIDAB in the last 72 hours. Anemia Panel: No results for input(s): VITAMINB12, FOLATE, FERRITIN, TIBC, IRON, RETICCTPCT in  the last 72 hours. Sepsis Labs: Recent Labs  Lab 12/03/18 0825 12/03/18 1121  LATICACIDVEN 0.9 1.0    Recent Results (from the past 240 hour(s))  Blood culture (routine x 2)     Status: None   Collection Time: 11/30/18  4:12 PM   Specimen: BLOOD  Result Value Ref Range Status   Specimen Description   Final    BLOOD RIGHT ANTECUBITAL Performed at [facility] [address], [place], [place] [address]    Special Requests   Final    BOTTLES DRAWN AEROBIC ONLY  Blood Culture adequate volume Performed at [facility] [address] [place], [place] [address]    Culture   Final    NO GROWTH 5 DAYS Performed at [facility], [place] [address] [place], [place] [address]    Report Status 12/05/2018 FINAL  Final  Blood culture (routine x 2)     Status: None   Collection Time: 11/30/18  4:12 PM   Specimen: BLOOD  Result Value Ref Range Status   Specimen Description   Final    BLOOD LEFT ANTECUBITAL Performed at [facility] [address] [place], [place] [address]    Special Requests   Final    BOTTLES DRAWN AEROBIC ONLY Blood Culture adequate volume Performed at [facility] [address] [place], [place] [address]    Culture   Final    NO GROWTH 5 DAYS Performed at [facility], [place] [address] [place], [place] [address]    Report Status 12/05/2018 FINAL  Final  SARS Coronavirus 2 [facility] order, Performed in [facility] hospital lab) Nasopharyngeal Nasopharyngeal Swab     Status: None   Collection Time: 11/30/18  5:54 PM   Specimen: Nasopharyngeal Swab  Result Value Ref Range Status   SARS Coronavirus 2 NEGATIVE NEGATIVE Final    Comment: (NOTE) If result is NEGATIVE SARS-CoV-2 target nucleic acids are NOT DETECTED. The SARS-CoV-2 RNA is generally detectable in upper and lower  respiratory specimens during the acute phase of infection. The lowest  concentration of SARS-CoV-2  viral copies this assay can detect is 250  copies / mL. A negative result does not preclude SARS-CoV-2 infection  and should not be used as the sole basis for treatment or other  patient management decisions.  A negative result may occur with  improper specimen collection / handling, submission of specimen other  than nasopharyngeal swab, presence of viral mutation(s) within the  areas targeted by this assay, and inadequate number of viral copies  (<250 copies / mL). A negative result must be combined with clinical  observations, patient history, and epidemiological information. If result is POSITIVE SARS-CoV-2 target nucleic acids are DETECTED. The SARS-CoV-2 RNA is generally detectable in upper and lower  respiratory specimens dur ing the acute phase of infection.  Positive  results are indicative of active infection with SARS-CoV-2.  Clinical  correlation with patient history and other diagnostic information is  necessary to determine patient infection status.  Positive results do  not rule out bacterial infection or co-infection with other viruses. If result is PRESUMPTIVE POSTIVE SARS-CoV-2 nucleic acids MAY BE PRESENT.   A presumptive positive result was obtained on the submitted specimen  and confirmed on repeat testing.  While 2019 novel coronavirus  (SARS-CoV-2) nucleic acids may be present in the submitted sample  additional confirmatory testing may be necessary for epidemiological  and / or clinical management purposes  to differentiate between  SARS-CoV-2 and other Sarbecovirus currently known to infect humans.  If clinically indicated additional testing with an alternate test  methodology [phone]) is advised. The SARS-CoV-2 RNA is generally  detectable in upper and lower respiratory sp ecimens during the acute  phase of infection. The expected result is Negative. Fact Sheet for Patients:  StrictlyIdeas.no Fact Sheet for Healthcare Providers:  BankingDealers.co.za This test is not yet approved or cleared by the [place] FDA and has been authorized for detection and/or diagnosis of SARS-CoV-2 by FDA under an Emergency Use Authorization (EUA).  This EUA will remain in effect (meaning this test can be used) for the duration of the COVID-19 declaration under Section 564(b)(1) of the Act, 21 U.S.C. section 360bbb-3(b)(1), unless the authorization is terminated or revoked sooner. Performed at [facility], [place] [address], [place], [place] [address]   C difficile quick scan w PCR reflex     Status: None   Collection Time: 12/03/18  5:42 AM   Specimen: STOOL  Result Value Ref Range Status   C Diff antigen NEGATIVE NEGATIVE Final   C Diff toxin NEGATIVE NEGATIVE Final   C Diff interpretation No C. difficile detected.  Final    Comment: Performed at [facility], [place] [address], [place], [place] [address]  Urine Culture     Status: None   Collection Time: 12/03/18 12:13 PM   Specimen: Urine, Catheterized  Result Value Ref Range Status   Specimen Description   Final    URINE, CATHETERIZED Performed at [facility] [address], [place], [place] [address]    Special Requests   Final    Normal Performed at [facility], [place] [address], [place], [place] [address]    Culture   Final    NO GROWTH Performed at [facility], [place] [address], [place], [place] [address]    Report Status 12/04/2018 FINAL  Final         Radiology Studies: No results found.      Scheduled Meds: . apixaban  5 mg Oral BID  . Chlorhexidine Gluconate Cloth  6 each Topical Daily  . labetalol  10 mg Intravenous Once  . lip balm  1 application Topical BID  . mouth rinse  15 mL Mouth Rinse BID  . metoprolol succinate  75 mg Oral Daily  . pantoprazole  40 mg Oral BID AC  . rosuvastatin  20 mg Oral QHS   Continuous Infusions: .  amiodarone Stopped (12/09/18 0019)     LOS: 9 days    Time spent: 40 minutes    Irine Seal, MD [facility]  If 7PM-7AM, please contact night-coverage [email or web address] 12/09/2018, 9:40 AM

## 2018-12-10 LAB — BASIC METABOLIC PANEL
Anion gap: 10 (ref 5–15)
BUN: 15 mg/dL (ref 8–23)
CO2: 23 mmol/L (ref 22–32)
Calcium: 9.1 mg/dL (ref 8.9–10.3)
Chloride: 104 mmol/L (ref 98–111)
Creatinine, Ser: 0.77 mg/dL (ref 0.61–1.24)
GFR calc Af Amer: >60 mL/min (ref >60)
GFR calc non Af Amer: >60 mL/min (ref >60)
Glucose, Bld: 106 mg/dL — ABNORMAL HIGH (ref 70–99)
Potassium: 4.3 mmol/L (ref 3.5–5.1)
Sodium: 137 mmol/L (ref 135–145)

## 2018-12-10 MED ORDER — DILTIAZEM HCL ER COATED BEADS 120 MG PO CP24: 120.0000 mg | ORAL_CAPSULE | Freq: Every day | ORAL | Status: DC

## 2018-12-10 MED ORDER — DILTIAZEM HCL ER COATED BEADS 240 MG PO CP24
240.0000 mg | ORAL_CAPSULE | Freq: Every day | ORAL | Status: DC
  Administered 2018-12-10 – 2018-12-11 (×2): 240 mg via ORAL
  Filled 2018-12-10: qty 1
  Filled 2018-12-10: qty 2

## 2018-12-10 NOTE — Progress Notes (Signed)
[facility] for Apixaban Indication: atrial fibrillation   Allergies  Allergen Reactions  . 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 Nsaids     Duodenal ulcer.  anticoagulated   Patient Measurements: Height: 5\' 9"  (175.3 cm) Weight: 184 lb 4.9 oz (83.6 kg) IBW/kg (Calculated) : 70.7 Heparin Dosing Weight: 91 kg  Vital Signs: Temp: 97.6 F (36.4 C) (08/23 0804) Temp Source: Oral (08/23 0804) BP: 113/83 (08/23 0400) Pulse Rate: 104 (08/23 0400)  Labs: Recent Labs    12/08/18 0214 12/09/18 0209 12/10/18 0239  HGB 13.4 13.0  --   HCT 42.2 41.3  --   PLT 468* 354  --   CREATININE 0.75 0.83 0.77   Estimated Creatinine Clearance: 88.4 mL/min (by C-G formula based on SCr of 0.77 mg/dL).  Assessment: 68 yo M on Eliquis PTA for Afib.  Admitted with duodenitis and possible RTP perforation.  Pharmacy consulted to start heparin bridge therapy while Eliquis on hold.   PTA Eliquis 5 mg po bid, last dose 8/13 at 07 am. Pt in AKI on admission that has now resolved.  Hg 11.8, PLTC 312 - stable, no bleeding reported.  8/16: Heparin drip was turned off this morning at 0810 AM by [facility] for possible surgical intervention, resumed 8/17: blood noted in foley bag, Hep stopped by RN, off x 3 hr, resume per Urology & [facility]  Today, 12/10/2018 Last CBC 8/22, wnl Renal fx stable  Goal of Therapy:  Heparin level 0.3-0.7 units/ml aPTT 66-102 seconds Monitor platelets by anticoagulation protocol: Yes   Plan:  Continue Apixaban 5mg  bid, was resumed 8/19 pm (no surgical procedure needed)  Minda Ditto PharmD Pager [phone] 12/10/2018, 8:32 AM

## 2018-12-10 NOTE — Progress Notes (Signed)
Patient refuses to wear CPAP.

## 2018-12-10 NOTE — Progress Notes (Signed)
Progress Note  Patient Name: Philip Jackson Date of Encounter: 12/10/2018  Primary Cardiologist: Belva Crome III, MD   Subjective   He cardioverted to SR the last night at 7 pm. Feels well, no SOB.  Inpatient Medications    Scheduled Meds: . apixaban  5 mg Oral BID  . Chlorhexidine Gluconate Cloth  6 each Topical Daily  . diltiazem  120 mg Oral Daily  . labetalol  10 mg Intravenous Once  . lip balm  1 application Topical BID  . mouth rinse  15 mL Mouth Rinse BID  . metoprolol succinate  75 mg Oral Daily  . pantoprazole  40 mg Oral BID AC  . rosuvastatin  20 mg Oral QHS   Continuous Infusions:  PRN Meds: albuterol, clobetasol cream, hydrALAZINE, magic mouthwash, menthol-cetylpyridinium, methocarbamol, metoCLOPramide (REGLAN) injection, metoprolol tartrate, ondansetron **OR** ondansetron (ZOFRAN) IV, phenol, polyvinyl alcohol, traMADol, zolpidem   Vital Signs    Vitals:   12/10/18 0000 12/10/18 0400 12/10/18 0500 12/10/18 0804  BP: 109/80 113/83    Pulse: (!) 111 (!) 104    Resp: (!) 21 18    Temp: 98 F (36.7 C) 97.8 F (36.6 C)  97.6 F (36.4 C)  TempSrc: Oral   Oral  SpO2: 96% 94%    Weight:   83.6 kg   Height:        Intake/Output Summary (Last 24 hours) at 12/10/2018 0855 Last data filed at 12/10/2018 0700 Gross per 24 hour  Intake 60 ml  Output 2450 ml  Net -2390 ml   Last 3 Weights 12/10/2018 12/09/2018 12/08/2018  Weight (lbs) 184 lb 4.9 oz 189 lb 9.5 oz 186 lb 4.6 oz  Weight (kg) 83.6 kg 86 kg 84.5 kg      Telemetry    A-fib -->SR - Personally Reviewed  ECG    No new tracing - Personally Reviewed  Physical Exam  NAD GEN: No acute distress.   Neck: No JVD Cardiac: RRR, no murmurs, rubs, or gallops.  Respiratory: few crackles at bases bilaterally. GI: Soft, nontender, non-distended  MS: No edema; No deformity. Neuro:  Nonfocal  Psych: Normal affect   Labs    High Sensitivity Troponin:  No results for input(s): TROPONINIHS in the last 720  hours.   Cardiac EnzymesNo results for input(s): TROPONINI in the last 168 hours. No results for input(s): TROPIPOC in the last 168 hours.   Chemistry Recent Labs  Lab 12/05/18 0216  12/08/18 0214 12/09/18 0209 12/10/18 0239  NA 137   < > 136 135 137  K 3.7   < > 4.8 4.1 4.3  CL 106   < > 104 104 104  CO2 22   < > 19* 21* 23  GLUCOSE 110*   < > 105* 128* 106*  BUN 11   < > 11 19 15   CREATININE 0.75   < > 0.75 0.83 0.77  CALCIUM 8.1*   < > 9.7 9.1 9.1  PROT 6.3*  --   --   --   --   ALBUMIN 2.7*  --   --   --   --   AST 20  --   --   --   --   ALT 30  --   --   --   --   ALKPHOS 45  --   --   --   --   BILITOT 0.8  --   --   --   --  GFRNONAA >60   < > >60 >60 >60  GFRAA >60   < > >60 >60 >60  ANIONGAP 9   < > 13 10 10    < > = values in this interval not displayed.    Hematology Recent Labs  Lab 12/06/18 0217 12/08/18 0214 12/09/18 0209  WBC 14.2* 13.3* 14.1*  RBC 4.04* 4.45 4.36  HGB 12.1* 13.4 13.0  HCT 37.8* 42.2 41.3  MCV 93.6 94.8 94.7  MCH 30.0 30.1 29.8  MCHC 32.0 31.8 31.5  RDW 12.9 13.3 13.2  PLT 413* 468* 354   BNPNo results for input(s): BNP, PROBNP in the last 168 hours.   DDimer No results for input(s): DDIMER in the last 168 hours.   Radiology    No results found.  Cardiac Studies    Patient Profile     68 y.o. male Philip Jackson is a 68 y.o. male with a hx of paroxysmal atrial fibrillation on Eliquis, PSVT, sleep apnea on BiPap at hs, hypertension who is being seen today for the evaluation of atrial fibrillation with RVR at the request of Dr. [place] Silos.  Assessment & Plan    Atrial fibrillation with RVR -Patient admitted with abdominal pain found to have duodenitis.  Not felt to require surgery. -Patient has a history of A. fib, monitor in June showed up to 44% A. fib with increased rates.  Patient also has episodes of SVT and is planned for future EP evaluation by Dr. Lovena Le. -Patient was maintaining sinus rhythm then developed A. fib with  RVR. He cardioverted to SR the night of 12/07/18. -amiodarone was discontinued as he remains in SR -Continue Toprol 75 mg daily and Eliquis 5 mg po BID - increase Cardizem CD to 240 mg po daily and he remains tachycardic but BP is soft, uptitrate to 360 mg PO CD daily (ihs home dose) once BP improves  Hypertension -Continue holding cardizem as his BP is still soft, restart if he becomes hypertensive  Obstructive sleep apnea -Patient on BiPAP at home.  Should be continued while in the hospital.  [facility] will sign off.   Medication Recommendations:  As above Other recommendations (labs, testing, etc):  No further testing Follow up as an outpatient:  PRN  For questions or updates, please contact [place] Please consult [email or web address] for contact info under    Signed, Ena Dawley, MD  12/10/2018, 8:55 AM

## 2018-12-10 NOTE — Progress Notes (Signed)
PROGRESS NOTE    Philip Jackson  NWG:[id number] DOB: 1950-09-17 DOA: 11/30/2018 PCP: Leonides Sake, MD    Brief Narrative:  68 year old with past medical history significant for A. fib on Eliquis, GERD, hypertension, OSA who presented complaining of acute onset progressively getting worse abdominal pain and distention since 2 days prior to admission.  Patient had a recent arthroplasty by Dr. Wynelle Link on 11/20/2018.  Patient has been also complaining of swelling in his right leg since surgery.  Patient presented with AKI with a creatinine of 4.2, hypoxemia, A. fib with RVR, signs of sepsis, CT abdomen showed possible duodenitis and perforation.  Surgery was consulted they are recommending conservative management. Patient renal function has improved, he is A. fib RVR has been controlled on IV Cardizem and IV metoprolol.  He does have paroxysmal A. fib and he has been converting back into A. fib in sinus rhythm during this hospitalization.  He was a started on IV heparin, while we have been holding Eliquis.  Patient third day of hospitalization developed worsening abdominal distention and worsening pain.  CT abdomen was repeated and he was diagnosed with an ileus.  Also CT mention right side hydronephrosis and a questionable bladder mass and enlarged prostate.  Patient was evaluated by urology who think that prostate enlargement is what we are seeing as a bladder mass.  Also patient will need to be discharged with Foley catheter    Assessment & Plan:   Principal Problem:   AKI (acute kidney injury) ([place]) Active Problems:   HTN (hypertension)   Atrial fibrillation with RVR (HCC)   HLD (hyperlipidemia)   OSA treated with BiPAP   Duodenitis   Urine retention   Hyperkalemia   Postoperative urinary retention   Ileus (HCC)   Sepsis with acute renal failure (HCC)   Pain and swelling of right knee   Abdominal distension   Duodenal perforation (HCC)  1 A. fib with RVR Likely secondary to acute  illness of intra-abdominal process.  Patient converted to sinus rhythm 2 days ago.  Patient noted to have jumped back into A. fib the night of 12/07/2018, with heart rates going as high as the 200s per RN while in the bathroom.  Cardizem drip has been discontinued.  Scheduled IV Lopressor has been discontinued.  Patient was back on home dose oral Cardizem.  Home dose oral Toprol has been increased to 75 mg daily.  Eliquis has been resumed for anticoagulation.  Due to uncontrolled heart rates and patient going in and out of atrial fibrillation, cardiology was consulted.  Patient was started on an amiodarone drip on 12/08/2018.  Patient converted to normal sinus rhythm the night 12/08/2018 around 7:42 PM with heart rates controlled.  Amiodarone drip has been discontinued.  Per cardiology continue Toprol-XL now.  Patient with some tachycardia this morning with some improvement in blood pressure and as such Cardizem CD 120 mg was started however dose seems to have been increased to Cardizem CD 240 mg per cardiology. Outpatient follow-up with cardiology.  Appreciate cardiology input and recommendations.  2.  Sepsis Patient presented with concern for sepsis which met criteria due to leukocytosis, tachycardia, tachypnea.  Felt secondary to intra-abdominal process with possibly contained perforation.  Patient pancultured with no growth to date.  Patient was on IV vancomycin has been discontinued.  Patient on IV Zosyn.  Leukocytosis trended down.  WBC on admission was 43,000 and currently seems to be stabilizing down to 14.1.  IV Zosyn has been discontinued per general  surgery.  3.  Duodenitis/ileus Dr. Tyrell Antonio discussed case with Dr. Marcello Moores who reviewed CT abdomen and does not feel there is free air and if there was perforation was contained.  Patient improving clinically.  Repeat CT abdomen with improvement with duodenum thickening and decreasing phlegmon.  Ileus improving.  Patient tolerated full liquid diet.  NG tube  removed.  Patient transition to a soft diet which he is tolerating.  Change IV Protonix to oral PPI twice daily.  IV fluids have been saline locked.  IV antibiotics discontinued per general surgery.  Will need outpatient follow-up with GI for endoscopy in 6 to 8 weeks.  Appreciate general surgery's input and recommendations.  General surgery signed off on 12/08/2018.   5.  Acute hypoxic respiratory failure Felt secondary to intra-abdominal process with decreased lung compliance.  Improved clinically.  Currently satting 98% on room air.  Follow.  6.  Hyperkalemia Felt secondary to acute kidney injury.  Resolved.  7.  Hypokalemia Repleted.  Discontinued daily potassium supplementation.  8.  Acute renal failure Secondary to obstructive uropathy and hypovolemia in the setting of ACE inhibitor and sepsis.  Patient noted on admission to have a creatinine of 4.  CT abdomen and pelvis done concerning for hydronephrosis, bladder mass versus blood product.  Urology consulted and was felt mass likely related to enlarged prostate.  Foley catheter placed with good urine output.  Urine output of 2.450 L over the past 24 hours.  Patient is - 18.202 L during this hospitalization.  Creatinine down to 0.77.  Patient has been seen in consultation by urology who are recommending discharging patient on Foley catheter with close outpatient follow-up for voiding trial and further evaluation.  Oxybutynin has been discontinued.   9.  Right knee swelling. Patient is status post recent surgery to the right knee.  Orthopedics was consulted and it was felt no signs of infection noted.  Ultram as needed pain.  Outpatient follow-up.  10.  Hematuria Flushes ordered per urology.  Improved.  Hemoglobin stable.  Patient transitioned from heparin to Eliquis.  Outpatient follow-up.  11.  OSA CPAP nightly however per epic notes patient has been refusing.   DVT prophylaxis: Eliquis Code Status: Full Family Communication: updated  patient. No family at bedside. Disposition Plan: Transfer to telemetry.  Likely home if heart rate remains controlled.   Consultants:   General surgery: Dr. Leighton Ruff 12/14/32  Urology: Dr. Tresa Moore 12/03/2018  Orthopedics:  Cardiology: Dr. Meda Coffee 12/08/2018  Procedures  CT abdomen and pelvis 12/03/2018, 11/30/2018  Abdominal x-ray 12/03/2018  Chest x-ray 11/30/2018  Plain films of the right femur 11/30/2018  Plain films of the right knee 11/30/2018  Antimicrobials:   IV Zosyn 12/01/2018>>>>> 12/06/2018  IV vancomycin 11/30/2018>>>> 12/01/2018   Subjective: Patient sitting up in bed watching television.  Feeling better.  No chest pain.  No shortness of breath.  Abdominal pain abdominal distention improved.  Loose stools decreasing with improvement with consistency.  Tolerating current diet.  Patient with some complaints of right knee pain stated just received some Ultram this morning.  Objective: Vitals:   12/10/18 0500 12/10/18 0800 12/10/18 0804 12/10/18 0923  BP:  130/85  126/83  Pulse:  (!) 107  (!) 107  Resp:  20    Temp:   97.6 F (36.4 C)   TempSrc:   Oral   SpO2:  95%    Weight: 83.6 kg     Height:        Intake/Output Summary (Last 24  hours) at 12/10/2018 1011 Last data filed at 12/10/2018 0700 Gross per 24 hour  Intake 60 ml  Output 2450 ml  Net -2390 ml   Filed Weights   12/08/18 0500 12/09/18 0311 12/10/18 0500  Weight: 84.5 kg 86 kg 83.6 kg    Examination:  General exam: NAD Respiratory system: Lungs clear to auscultation bilaterally.  No wheezes, no crackles, no rhonchi.  Normal respiratory effort.  Speaking in full sentences.  Cardiovascular system: Tachycardic.  No murmurs rubs or gallops.  No JVD. Trace right lower extremity edema.  Gastrointestinal system: Abdomen is nontender, nondistended, soft, positive bowel sounds.  No rebound.  No guarding.  Central nervous system: Alert and oriented. No focal neurological deficits. Extremities: Trace  right lower extremity edema.  Surgical incision on right knee C/D/I.  Skin: No rashes, lesions or ulcers Psychiatry: Judgement and insight appear normal. Mood & affect appropriate.     Data Reviewed: I have personally reviewed following labs and imaging studies  CBC: Recent Labs  Lab 12/04/18 0609 12/05/18 0216 12/05/18 1927 12/06/18 0217 12/08/18 0214 12/09/18 0209  WBC 17.4* 15.0*  --  14.2* 13.3* 14.1*  NEUTROABS  --   --   --   --  8.9*  --   HGB 10.7* 10.9* 12.5* 12.1* 13.4 13.0  HCT 33.5* 34.7* 39.1 37.8* 42.2 41.3  MCV 93.1 94.0  --  93.6 94.8 94.7  PLT 461* 414*  --  413* 468* 861   Basic Metabolic Panel: Recent Labs  Lab 12/06/18 0217 12/07/18 0223 12/08/18 0214 12/09/18 0209 12/10/18 0239  NA 138 137 136 135 137  K 4.4 4.6 4.8 4.1 4.3  CL 107 107 104 104 104  CO2 24 23 19* 21* 23  GLUCOSE 113* 112* 105* 128* 106*  BUN 10 7* _0 CREATININE 0.90 0.73 0.75 0.83 0.77  CALCIUM 8.5* 8.9 9.7 9.1 9.1  MG 2.0 2.1 2.0 1.9  --    GFR: Estimated Creatinine Clearance: 88.4 mL/min (by C-G formula based on SCr of 0.77 mg/dL). Liver Function Tests: Recent Labs  Lab 12/05/18 0216  AST 20  ALT 30  ALKPHOS 45  BILITOT 0.8  PROT 6.3*  ALBUMIN 2.7*   No results for input(s): LIPASE, AMYLASE in the last 168 hours. No results for input(s): AMMONIA in the last 168 hours. Coagulation Profile: No results for input(s): INR, PROTIME in the last 168 hours. Cardiac Enzymes: No results for input(s): CKTOTAL, CKMB, CKMBINDEX, TROPONINI in the last 168 hours. BNP (last 3 results) No results for input(s): PROBNP in the last 8760 hours. HbA1C: No results for input(s): HGBA1C in the last 72 hours. CBG: No results for input(s): GLUCAP in the last 168 hours. Lipid Profile: No results for input(s): CHOL, HDL, LDLCALC, TRIG, CHOLHDL, LDLDIRECT in the last 72 hours. Thyroid Function Tests: No results for input(s): TSH, T4TOTAL, FREET4, T3FREE, THYROIDAB in the last 72  hours. Anemia Panel: No results for input(s): VITAMINB12, FOLATE, FERRITIN, TIBC, IRON, RETICCTPCT in the last 72 hours. Sepsis Labs: Recent Labs  Lab 12/03/18 1121  LATICACIDVEN 1.0    Recent Results (from the past 240 hour(s))  Blood culture (routine x 2)     Status: None   Collection Time: 11/30/18  4:12 PM   Specimen: BLOOD  Result Value Ref Range Status   Specimen Description   Final    BLOOD RIGHT ANTECUBITAL Performed at [facility] [address], [place], [place] [address]    Special Requests  Final    BOTTLES DRAWN AEROBIC ONLY Blood Culture adequate volume Performed at [facility] [address], [place], [place] [address]    Culture   Final    NO GROWTH 5 DAYS Performed at [facility], [place] [address], [place], [place] [address]    Report Status 12/05/2018 FINAL  Final  Blood culture (routine x 2)     Status: None   Collection Time: 11/30/18  4:12 PM   Specimen: BLOOD  Result Value Ref Range Status   Specimen Description   Final    BLOOD LEFT ANTECUBITAL Performed at [facility] [address], [place], [place] [address]    Special Requests   Final    BOTTLES DRAWN AEROBIC ONLY Blood Culture adequate volume Performed at [facility] [address] [place], [place] [address]    Culture   Final    NO GROWTH 5 DAYS Performed at [facility], [place] [address] [place],  [address]    Report Status 12/05/2018 FINAL  Final  SARS Coronavirus 2 [facility] order, Performed in [facility] hospital lab) Nasopharyngeal Nasopharyngeal Swab     Status: None   Collection Time: 11/30/18  5:54 PM   Specimen: Nasopharyngeal Swab  Result Value Ref Range Status   SARS Coronavirus 2 NEGATIVE NEGATIVE Final    Comment: (NOTE) If result is NEGATIVE SARS-CoV-2 target nucleic acids are NOT DETECTED. The SARS-CoV-2 RNA is generally detectable in upper and lower  respiratory  specimens during the acute phase of infection. The lowest  concentration of SARS-CoV-2 viral copies this assay can detect is 250  copies / mL. A negative result does not preclude SARS-CoV-2 infection  and should not be used as the sole basis for treatment or other  patient management decisions.  A negative result may occur with  improper specimen collection / handling, submission of specimen other  than nasopharyngeal swab, presence of viral mutation(s) within the  areas targeted by this assay, and inadequate number of viral copies  (<250 copies / mL). A negative result must be combined with clinical  observations, patient history, and epidemiological information. If result is POSITIVE SARS-CoV-2 target nucleic acids are DETECTED. The SARS-CoV-2 RNA is generally detectable in upper and lower  respiratory specimens dur ing the acute phase of infection.  Positive  results are indicative of active infection with SARS-CoV-2.  Clinical  correlation with patient history and other diagnostic information is  necessary to determine patient infection status.  Positive results do  not rule out bacterial infection or co-infection with other viruses. If result is PRESUMPTIVE POSTIVE SARS-CoV-2 nucleic acids MAY BE PRESENT.   A presumptive positive result was obtained on the submitted specimen  and confirmed on repeat testing.  While 2019 novel coronavirus  (SARS-CoV-2) nucleic acids may be present in the submitted sample  additional confirmatory testing may be necessary for epidemiological  and / or clinical management purposes  to differentiate between  SARS-CoV-2 and other Sarbecovirus currently known to infect humans.  If clinically indicated additional testing with an alternate test  methodology [phone]) is advised. The SARS-CoV-2 RNA is generally  detectable in upper and lower respiratory sp ecimens during the acute  phase of infection. The expected result is Negative. Fact Sheet for  Patients:  StrictlyIdeas.no Fact Sheet for Healthcare Providers: BankingDealers.co.za This test is not yet approved or cleared by the [place] FDA and has been authorized for detection and/or diagnosis of SARS-CoV-2 by  FDA under an Emergency Use Authorization (EUA).  This EUA will remain in effect (meaning this test can be used) for the duration of the COVID-19 declaration under Section 564(b)(1) of the Act, 21 U.S.C. section 360bbb-3(b)(1), unless the authorization is terminated or revoked sooner. Performed at [facility], [place] [address], [place], [place] [address]   C difficile quick scan w PCR reflex     Status: None   Collection Time: 12/03/18  5:42 AM   Specimen: STOOL  Result Value Ref Range Status   C Diff antigen NEGATIVE NEGATIVE Final   C Diff toxin NEGATIVE NEGATIVE Final   C Diff interpretation No C. difficile detected.  Final    Comment: Performed at [facility], [place] [address] [place], [place] [address]  Urine Culture     Status: None   Collection Time: 12/03/18 12:13 PM   Specimen: Urine, Catheterized  Result Value Ref Range Status   Specimen Description   Final    URINE, CATHETERIZED Performed at [facility] [address], [place], [place] [address]    Special Requests   Final    Normal Performed at [facility], [place] [address] [place], [place] [address]    Culture   Final    NO GROWTH Performed at [facility], [place] [address] [place],  [address]    Report Status 12/04/2018 FINAL  Final         Radiology Studies: No results found.      Scheduled Meds: . apixaban  5 mg Oral BID  . Chlorhexidine Gluconate Cloth  6 each Topical Daily  . diltiazem  240 mg Oral Daily  . labetalol  10 mg Intravenous Once  . lip balm  1 application Topical BID  . mouth rinse  15 mL Mouth Rinse BID  . metoprolol succinate   75 mg Oral Daily  . pantoprazole  40 mg Oral BID AC  . rosuvastatin  20 mg Oral QHS   Continuous Infusions:    LOS: 10 days    Time spent: 40 minutes    Irine Seal, MD [facility]  If 7PM-7AM, please contact night-coverage [email or web address] 12/10/2018, 10:11 AM

## 2018-12-11 ENCOUNTER — Ambulatory Visit: Payer: BC Managed Care – PPO | Admitting: Physical Therapy

## 2018-12-11 LAB — BASIC METABOLIC PANEL
Anion gap: 11 (ref 5–15)
BUN: 14 mg/dL (ref 8–23)
CO2: 22 mmol/L (ref 22–32)
Calcium: 8.8 mg/dL — ABNORMAL LOW (ref 8.9–10.3)
Chloride: 101 mmol/L (ref 98–111)
Creatinine, Ser: 0.82 mg/dL (ref 0.61–1.24)
GFR calc Af Amer: >60 mL/min (ref >60)
GFR calc non Af Amer: >60 mL/min (ref >60)
Glucose, Bld: 110 mg/dL — ABNORMAL HIGH (ref 70–99)
Potassium: 3.7 mmol/L (ref 3.5–5.1)
Sodium: 134 mmol/L — ABNORMAL LOW (ref 135–145)

## 2018-12-11 LAB — MAGNESIUM: Magnesium: 1.6 mg/dL — ABNORMAL LOW (ref 1.7–2.4)

## 2018-12-11 MED ORDER — PANTOPRAZOLE SODIUM 40 MG PO TBEC: 40.0000 mg | DELAYED_RELEASE_TABLET | Freq: Two times a day (BID) | ORAL | 3 refills | Status: DC

## 2018-12-11 MED ORDER — SENNOSIDES-DOCUSATE SODIUM 8.6-50 MG PO TABS: 1.0000 | ORAL_TABLET | Freq: Two times a day (BID) | ORAL | Status: DC

## 2018-12-11 MED ORDER — METOPROLOL SUCCINATE ER 25 MG PO TB24: 75.0000 mg | ORAL_TABLET | Freq: Every day | ORAL | 3 refills | Status: DC

## 2018-12-11 MED ORDER — MAGNESIUM SULFATE 4 GM/100ML IV SOLN
4.0000 g | Freq: Once | INTRAVENOUS | Status: AC
  Administered 2018-12-11: 4 g via INTRAVENOUS
  Filled 2018-12-11: qty 100

## 2018-12-11 NOTE — Plan of Care (Signed)
  Problem: Education: Goal: Knowledge of disease or condition will improve 12/11/2018 1338 by Timoteo Gaul, RN Outcome: Completed/Met 12/11/2018 1110 by Timoteo Gaul, RN Outcome: Progressing Goal: Understanding of medication regimen will improve 12/11/2018 1338 by Timoteo Gaul, RN Outcome: Completed/Met 12/11/2018 1110 by Timoteo Gaul, RN Outcome: Progressing Goal: Individualized Educational Video(s) 12/11/2018 1338 by Timoteo Gaul, RN Outcome: Completed/Met 12/11/2018 1110 by Timoteo Gaul, RN Outcome: Progressing   Problem: Cardiac: Goal: Ability to achieve and maintain adequate cardiopulmonary perfusion will improve 12/11/2018 1338 by Timoteo Gaul, RN Outcome: Completed/Met 12/11/2018 1110 by Timoteo Gaul, RN Outcome: Progressing   Problem: Health Behavior/Discharge Planning: Goal: Ability to safely manage health-related needs after discharge will improve 12/11/2018 1338 by Timoteo Gaul, RN Outcome: Completed/Met 12/11/2018 1110 by Timoteo Gaul, RN Outcome: Progressing   Problem: Acute Rehab PT Goals(only PT should resolve) Goal: Pt Will Go Supine/Side To Sit Outcome: Completed/Met Goal: Patient Will Transfer Sit To/From Stand Outcome: Completed/Met Goal: Pt Will Ambulate Outcome: Completed/Met   Problem: Education: Goal: Knowledge of General Education information will improve Description: Including pain rating scale, medication(s)/side effects and non-pharmacologic comfort measures 12/11/2018 1338 by Timoteo Gaul, RN Outcome: Completed/Met 12/11/2018 1110 by Timoteo Gaul, RN Outcome: Progressing   Problem: Health Behavior/Discharge Planning: Goal: Ability to manage health-related needs will improve 12/11/2018 1338 by Timoteo Gaul, RN Outcome: Completed/Met 12/11/2018 1110 by Timoteo Gaul, RN Outcome: Progressing   Problem: Clinical Measurements: Goal: Ability to maintain clinical measurements within normal limits will  improve 12/11/2018 1338 by Timoteo Gaul, RN Outcome: Completed/Met 12/11/2018 1110 by Timoteo Gaul, RN Outcome: Progressing Goal: Will remain free from infection 12/11/2018 1338 by Timoteo Gaul, RN Outcome: Completed/Met 12/11/2018 1110 by Timoteo Gaul, RN Outcome: Progressing Goal: Diagnostic test results will improve 12/11/2018 1338 by Timoteo Gaul, RN Outcome: Completed/Met 12/11/2018 1110 by Timoteo Gaul, RN Outcome: Progressing Goal: Respiratory complications will improve 12/11/2018 1338 by Timoteo Gaul, RN Outcome: Completed/Met 12/11/2018 1110 by Timoteo Gaul, RN Outcome: Progressing Goal: Cardiovascular complication will be avoided 12/11/2018 1338 by Timoteo Gaul, RN Outcome: Completed/Met 12/11/2018 1110 by Timoteo Gaul, RN Outcome: Progressing   Problem: Nutrition: Goal: Adequate nutrition will be maintained 12/11/2018 1338 by Timoteo Gaul, RN Outcome: Completed/Met 12/11/2018 1110 by Timoteo Gaul, RN Outcome: Progressing   Problem: Coping: Goal: Level of anxiety will decrease 12/11/2018 1338 by Timoteo Gaul, RN Outcome: Completed/Met 12/11/2018 1110 by Timoteo Gaul, RN Outcome: Progressing   Problem: Elimination: Goal: Will not experience complications related to bowel motility 12/11/2018 1338 by Timoteo Gaul, RN Outcome: Completed/Met 12/11/2018 1110 by Timoteo Gaul, RN Outcome: Progressing Goal: Will not experience complications related to urinary retention 12/11/2018 1338 by Timoteo Gaul, RN Outcome: Completed/Met 12/11/2018 1110 by Timoteo Gaul, RN Outcome: Progressing   Problem: Pain Managment: Goal: General experience of comfort will improve 12/11/2018 1338 by Timoteo Gaul, RN Outcome: Completed/Met 12/11/2018 1110 by Timoteo Gaul, RN Outcome: Progressing   Problem: Safety: Goal: Ability to remain free from injury will improve 12/11/2018 1338 by Timoteo Gaul, RN Outcome: Completed/Met 12/11/2018  1110 by Timoteo Gaul, RN Outcome: Progressing

## 2018-12-11 NOTE — Plan of Care (Signed)
  Problem: Education: Goal: Knowledge of disease or condition will improve Outcome: Progressing Goal: Understanding of medication regimen will improve Outcome: Progressing Goal: Individualized Educational Video(s) Outcome: Progressing   Problem: Cardiac: Goal: Ability to achieve and maintain adequate cardiopulmonary perfusion will improve Outcome: Progressing   Problem: Health Behavior/Discharge Planning: Goal: Ability to safely manage health-related needs after discharge will improve Outcome: Progressing   Problem: Education: Goal: Knowledge of General Education information will improve Description: Including pain rating scale, medication(s)/side effects and non-pharmacologic comfort measures Outcome: Progressing   Problem: Health Behavior/Discharge Planning: Goal: Ability to manage health-related needs will improve Outcome: Progressing   Problem: Clinical Measurements: Goal: Ability to maintain clinical measurements within normal limits will improve Outcome: Progressing Goal: Will remain free from infection Outcome: Progressing Goal: Diagnostic test results will improve Outcome: Progressing Goal: Respiratory complications will improve Outcome: Progressing Goal: Cardiovascular complication will be avoided Outcome: Progressing   Problem: Nutrition: Goal: Adequate nutrition will be maintained Outcome: Progressing   Problem: Coping: Goal: Level of anxiety will decrease Outcome: Progressing   Problem: Elimination: Goal: Will not experience complications related to bowel motility Outcome: Progressing Goal: Will not experience complications related to urinary retention Outcome: Progressing   Problem: Pain Managment: Goal: General experience of comfort will improve Outcome: Progressing   Problem: Safety: Goal: Ability to remain free from injury will improve Outcome: Progressing

## 2018-12-11 NOTE — Discharge Summary (Signed)
Physician Discharge Summary  Philip Jackson DOB: 1950-05-24 DOA: 11/30/2018  PCP: Leonides Sake, MD  Admit date: 11/30/2018 Discharge date: 12/11/2018  Time spent: 60 minutes  Recommendations for Outpatient Follow-up:  1. Follow-up with Hamrick, Maura L, MD in 2 weeks.  On follow-up patient will need a basic metabolic profile done to follow-up on electrolytes and renal function.  Patient will need a magnesium level checked.  Patient need a CBC done to follow-up on H&H. 2. Follow-up with Dr. Daneen Schick, cardiology in 1 to 2 weeks.  On follow-up patient's A. fib will need to be reassessed as well as his medication management. 3. Follow-up with Dr. Tresa Moore, urology or with Dr. Rosana Hoes in 1 week for voiding trial and further evaluation for probably enlarged prostate. 4. Follow-up with [facility] in 4 weeks.  On follow-up duodenitis will need to be reassessed.  Patient would likely benefit from upper endoscopy in 6 to 8 weeks for further evaluation.     Discharge Diagnoses:  Principal Problem:   Atrial fibrillation with RVR ([place]) Active Problems:   HTN (hypertension)   HLD (hyperlipidemia)   OSA treated with BiPAP   AKI (acute kidney injury) ([place])   Duodenitis   Urine retention   Hyperkalemia   Postoperative urinary retention   Ileus ([facility])   Sepsis with acute renal failure ([facility])   Pain and swelling of right knee   Abdominal distension   Duodenal perforation ([place])   Discharge Condition: Stable and improved  Diet recommendation: Heart healthy  Filed Weights   12/09/18 0311 12/10/18 0500 12/11/18 0517  Weight: 86 kg 83.6 kg 84.2 kg    History of present illness:  HPI per Dr. Grayce Sessions Schellenberg is a 68 y.o. male past medical history significant for A. fib on Eliquis, GERD, hypertension, OSA who presents complaining of acute onset progressively worse abdominal distention for the last 2 days prior to admission.  Accompanied by shortness of breath.  Patient is  a status post 10 days right total knee arthroplasty with Dr. Wynelle Link on 11/20/2018.  Patient has been also complaining of swelling of his right leg since surgery.  He also reports decreased urine output.  His been feeling weak and short of breath especially on exertion.  He does have a history of difficulty with urinating past operatively. He report abdominal pain and distension for last 3 days, not eating well. Problems with constipation and poor urine out put.   Patient has not been able to eat, only drinking 7 bottle of water. He has been constipated. He has small BM twice this afternoon, per wife. He has been drinking prune juice. Eating apple sauce.   Evaluation in the ED: Patient was tachycardic heart rate in 114, respiration rate 22 blood pressure 108 82 oxygen sat was 80 on room air improved on 2 L of oxygen.  Sodium 124, potassium 5.3, chloride 88, BUN 55, creatinine 4.2, calcium 8.5, lipase 96, bilirubin 1.4, lactic acid 1.5, hemoglobin 11, white blood cell 43.  UA 0-5 white blood cell red blood cell more than 50. Of note patient had Foley catheter placed yielding 2 L of urine. CT abdomen and pelvis:Focal circumferential thickening of the duodenal sweep with extensive adjacent periduodenal stranding and phlegmonous change and likely reactive free fluid in the anterior pararenal space of the retroperitoneum. Findings are concerning for duodenal ulcer with possible perforation, particularly given history of recent surgery. Asymmetric right perinephric stranding with dilatation urothelial thickening of the right ureter without visualization  of an obstructing ureteral calculus, which is favored to be reactive partial obstruction secondary to the adjacent pararenal process. Small amount of subcutaneous gas tracking within the fascial planes of the right lower extremity with circumferential soft tissue swelling asymmetrically increased in the right proximal thigh, possibly related to recent lower  extremity surgery though should correlate with exam findings and clinically exclude the presence of aggressive soft tissue infection.  Hospital Course:  1 paroxysmal A. fib with RVR Likely secondary to acute illness of intra-abdominal process.  Patient initially noted to be in A. fib with RVR and had to be placed on a Cardizem drip.  Patient improved clinically and subsequently converted back to normal sinus rhythm.  Patient noted to have jumped back into A. fib the night of 12/07/2018, with heart rates going as high as the 200s per RN while in the bathroom.  Cardizem drip has been discontinued.  Scheduled IV Lopressor has been discontinued.  Patient was back on home dose oral Cardizem.  Home dose oral Toprol was increased to 75 mg daily.  Eliquis has been resumed for anticoagulation.  Due to uncontrolled heart rates and patient going in and out of atrial fibrillation, cardiology was consulted.  Patient was started on an amiodarone drip on 12/08/2018.  Patient converted to normal sinus rhythm the night 12/08/2018 around 7:42 PM with heart rates controlled.  Amiodarone drip was subsequently discontinued.  Per cardiology continue Toprol-XL now.  Patient with some tachycardia  with some improvement in blood pressure and as such Cardizem CD 120 mg was started however dose increased to Cardizem CD 240 mg per cardiology.  Patient be discharged home on home regimen of Cardizem CD 360 mg daily in addition to his Toprol-XL.  Outpatient follow-up with cardiology.   2.  Sepsis Patient presented with concern for sepsis which met criteria due to leukocytosis, tachycardia, tachypnea.  Felt secondary to intra-abdominal process with possibly contained perforation.  Patient pancultured with no growth to date.  Patient was on IV vancomycin has been discontinued.  Patient on IV Zosyn.  Leukocytosis trended down.  WBC on admission was 43,000 and improved and stabilized to 14.1 by day of discharge.  Patient received a full course  of IV antibiotics and IV Zosyn was subsequently discontinued by general surgery.  Patient remained afebrile.  Improved clinically and was close to baseline by day of discharge.   3.  Duodenitis/ileus Dr. Tyrell Antonio discussed case with Dr. Marcello Moores who reviewed CT abdomen and does not feel there is free air and if there was perforation was contained.  Patient improving clinically.  Repeat CT abdomen with improvement with duodenum thickening and decreasing phlegmon.  Ileus improving.  Patient tolerated full liquid diet.  NG tube removed.  Patient transitioned to a soft diet which he was tolerating.    Patient initially on IV Protonix and subsequently transition to oral Protonix twice daily.  IV fluids were subsequently saline locked.  IV antibiotics discontinued per general surgery.  Will need outpatient follow-up with GI for endoscopy in 6 to 8 weeks.   5.  Acute hypoxic respiratory failure Felt secondary to intra-abdominal process with decreased lung compliance.  Improved clinically and hypoxia had resolved by day of discharge.  6.  Hyperkalemia Felt secondary to acute kidney injury.  Resolved.  7.  Hypokalemia Repleted.    Magnesium was repleted.  Outpatient follow-up.   8.  Acute renal failure Secondary to obstructive uropathy and hypovolemia in the setting of ACE inhibitor and sepsis.  Patient noted  on admission to have a creatinine of 4.  CT abdomen and pelvis done concerning for hydronephrosis, bladder mass versus blood product.  Urology consulted and was felt mass likely related to enlarged prostate.  Foley catheter placed with good urine output.  Patient is - 19.382 L during this hospitalization.  Creatinine down to 0.82 by day of discharge.  Patient has been seen in consultation by urology who are recommending discharging patient on Foley catheter with close outpatient follow-up for voiding trial and further evaluation.  Oxybutynin has been discontinued.  Outpatient follow-up with  urology.  9.  Right knee swelling. Patient is status post recent surgery to the right knee.  Orthopedics was consulted and it was felt no signs of infection noted.  Ultram as needed pain.  Outpatient follow-up.  10.  Hematuria Flushes ordered per urology.  Improved.  Hemoglobin stable.  Patient transitioned from heparin to Eliquis.  Outpatient follow-up with urology.  11.  OSA CPAP nightly however per epic notes patient has been refusing.   Procedures:  CT abdomen and pelvis 12/03/2018, 11/30/2018  Abdominal x-ray 12/03/2018  Chest x-ray 11/30/2018  Plain films of the right femur 11/30/2018  Plain films of the right knee 11/30/2018   Consultations:  General surgery: Dr. Leighton Ruff 8/46/9629  Urology: Dr. Tresa Moore 12/03/2018  Orthopedics:  Cardiology: Dr. Meda Coffee 12/08/2018   Discharge Exam: Vitals:   12/10/18 2055 12/11/18 0517  BP: 128/76 131/74  Pulse: 79 91  Resp: 18 18  Temp: 99.3 F (37.4 C) 99.2 F (37.3 C)  SpO2: 94% 94%    General: NAD Cardiovascular: RRR Respiratory: CTAB  Discharge Instructions   Discharge Instructions    Ambulatory referral to Physical Therapy   Complete by: As directed    Diet - low sodium heart healthy   Complete by: As directed    Increase activity slowly   Complete by: As directed      Allergies as of 12/11/2018      Reactions   Bee Pollen Anaphylaxis   Allergic to bees   Nsaids    Duodenal ulcer.  anticoagulated      Medication List    STOP taking these medications   losartan 50 MG tablet Commonly known as: COZAAR   tolterodine 1 MG tablet Commonly known as: DETROL     TAKE these medications   acetaminophen 500 MG tablet Commonly known as: TYLENOL Take two (2) tablets by mouth every 4 to 6 hours as needed for pain.   clobetasol cream 0.05 % Commonly known as: TEMOVATE Apply 1 application topically 2 (two) times daily as needed (psoriasis (elbows)).   diltiazem 360 MG 24 hr capsule Commonly known  as: Cardizem CD Take 1 capsule (360 mg total) by mouth daily.   Eliquis 5 MG Tabs tablet Generic drug: apixaban TAKE ONE TABLET BY MOUTH TWICE DAILY What changed: how much to take   ferrous sulfate 325 (65 FE) MG EC tablet Take 325 mg by mouth 2 (two) times a week.   Fish Oil 1200 MG Caps Take 1,200 mg by mouth 2 (two) times daily.   gabapentin 300 MG capsule Commonly known as: NEURONTIN Take 1 capsule (300 mg total) by mouth 3 (three) times daily. Take a 300 mg capsule three times a day for two weeks following surgery.Then take a 300 mg capsule two times a day for two weeks. Then take a 300 mg capsule once a day for two weeks. Then discontinue.   Lubricant Eye Drops 0.4-0.3 % Soln Generic  drug: Polyethyl Glycol-Propyl Glycol Place 1 drop into both eyes daily as needed (dry/irritated eyes.).   methocarbamol 500 MG tablet Commonly known as: ROBAXIN Take 1 tablet (500 mg total) by mouth every 6 (six) hours as needed for muscle spasms.   metoprolol succinate 25 MG 24 hr tablet Commonly known as: TOPROL-XL Take 3 tablets (75 mg total) by mouth daily. Start taking on: December 12, 2018 What changed:   medication strength  how much to take  additional instructions   omeprazole 40 MG capsule Commonly known as: PRILOSEC Take 40 mg by mouth at bedtime.   oxyCODONE 5 MG immediate release tablet Commonly known as: Oxy IR/ROXICODONE Take 1-2 tablets (5-10 mg total) by mouth every 6 (six) hours as needed for severe pain.   pantoprazole 40 MG tablet Commonly known as: PROTONIX Take 1 tablet (40 mg total) by mouth 2 (two) times daily before a meal.   PROSTATE SUPPORT PO Take 1 tablet by mouth 2 (two) times a day. Super Beta Prostate   rosuvastatin 20 MG tablet Commonly known as: CRESTOR Take 20 mg by mouth at bedtime.   senna-docusate 8.6-50 MG tablet Commonly known as: Senokot-S Take 1 tablet by mouth 2 (two) times daily.   SUPER B COMPLEX/C PO Take 1 capsule by mouth  daily.   traMADol 50 MG tablet Commonly known as: ULTRAM Take 1-2 tablets (50-100 mg total) by mouth every 6 (six) hours as needed for moderate pain.      Allergies  Allergen Reactions  . 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 Nsaids     Duodenal ulcer.  anticoagulated   Follow-up Information    Hamrick, Lorin Mercy, MD. Schedule an appointment as soon as possible for a visit in 2 week(s).   Specialty: Family Medicine Contact information: [place] [address] [phone]        Belva Crome, MD. Schedule an appointment as soon as possible for a visit in 1 week(s).   Specialty: Cardiology Why: f/u in 1-2 weeks. Contact information: [address] [place] [address] [phone]        Alexis Frock, MD. Schedule an appointment as soon as possible for a visit in 1 week(s).   Specialty: Urology Contact information: [place] [address] [phone]        [facility]. Schedule an appointment as soon as possible for a visit in 4 week(s).   Specialty: Gastroenterology Contact information: [address] [place] [address] [phone]           The results of significant diagnostics from this hospitalization (including imaging, microbiology, ancillary and laboratory) are listed below for reference.    Significant Diagnostic Studies: Ct Abdomen Pelvis Wo Contrast  Result Date: 11/30/2018 CLINICAL DATA:  Abdominal distension, intermittent shortness of breath for 2 days, unable to urinate EXAM: CT ABDOMEN AND PELVIS WITHOUT CONTRAST TECHNIQUE: Multidetector CT imaging of the abdomen and pelvis was performed following the standard protocol without IV contrast. COMPARISON:  None. FINDINGS: Lower chest: Trace right pleural effusion. Bandlike areas of scarring and/or atelectasis are present in both bases. Cardiomegaly. Coronary artery calcifications are noted. Trace  pericardial fluid likely within physiologic normal. Hepatobiliary: No focal liver abnormality is seen. No gallstones, gallbladder wall thickening, or biliary dilatation. Pancreas: Partial fatty replacement of the pancreas. Mild stranding near the pancreatic head appears likely reactive to the adjacent process rather than centered upon the pancreas itself.  Spleen: Normal in size without focal abnormality. Adrenals/Urinary Tract: Adrenal glands are unremarkable. There is asymmetrically increased right perinephric stranding and dilatation of the right ureter without visualization of a an obstructing ureteral calculus. There is abrupt narrowing of the ureteral caliber as it courses adjacent to the retroperitoneal stranding and inflammation detailed in the stomach/bowel section below. Stomach/Bowel: The distal esophagus is normal. The stomach is unremarkable. There is focal circumferential thickening of the duodenal sweep with extensive adjacent periduodenal stranding and phlegmonous change and likely reactive free fluid in the retroperitoneum, insinuating predominantly through the anterior pararenal space. No intraperitoneal or retroperitoneal extraluminal gas is identified. More distal small bowel is fluid-filled but within otherwise normal appearance. A appendix is present in the right lower quadrant coursing to the right upper quadrant. Limited fecalization of the large bowel contents with air and fluid seen in the distal colon. Vascular/Lymphatic: Limited evaluation the absence of contrast. Atherosclerotic calcification of the aorta and several branch vessels. No aneurysmal dilatation. Scattered reactive appearing lymph nodes are present in the upper abdomen and retroperitoneum. No suspicious or enlarged lymph nodes in the included lymphatic chains. Reproductive: The prostate and seminal vesicles are unremarkable. Other: No abdominal wall hernia or abnormality. No abdominopelvic ascites. Musculoskeletal: Small  amount of subcutaneous gas tracking within the fascial planes of the right lower extremity with circumferential soft tissue swelling asymmetrically increased in the right proximal thigh. Multilevel degenerative changes are present in the imaged portions of the spine. IMPRESSION: 1. Focal circumferential thickening of the duodenal sweep with extensive adjacent periduodenal stranding and phlegmonous change and likely reactive free fluid in the anterior pararenal space of the retroperitoneum. Findings are concerning for duodenal ulcer with possible perforation, particularly given history of recent surgery. 2. Asymmetric right perinephric stranding with dilatation urothelial thickening of the right ureter without visualization of an obstructing ureteral calculus, which is favored to be reactive partial obstruction secondary to the adjacent pararenal process. 3. Small amount of subcutaneous gas tracking within the fascial planes of the right lower extremity with circumferential soft tissue swelling asymmetrically increased in the right proximal thigh, possibly related to recent lower extremity surgery though should correlate with exam findings and clinically exclude the presence of aggressive soft tissue infection. 4. Aortic Atherosclerosis (ICD10-I70.0). These results were called by telephone at the time of interpretation on 11/30/2018 at 4:57 pm to [facility] PA, who verbally acknowledged these results. Electronically Signed   By: Lovena Le M.D.   On: 11/30/2018 17:00   Dg Abd 1 View  Result Date: 12/03/2018 CLINICAL DATA:  Abdominal distention. EXAM: ABDOMEN - 1 VIEW COMPARISON:  None. FINDINGS: Multiple mild to moderately dilated small bowel loops are seen, with a paucity of colonic gas. This is suspicious for small bowel obstruction. IMPRESSION: Findings suspicious for small bowel obstruction. Electronically Signed   By: Marlaine Hind M.D.   On: 12/03/2018 08:57   Ct Abdomen Pelvis W Contrast  Result Date:  12/03/2018 CLINICAL DATA:  Mid abdominal pain with nausea and vomiting, gastric ulcer. EXAM: CT ABDOMEN AND PELVIS WITH CONTRAST TECHNIQUE: Multidetector CT imaging of the abdomen and pelvis was performed using the standard protocol following bolus administration of intravenous contrast. CONTRAST:  143m OMNIPAQUE IOHEXOL 300 MG/ML  SOLN COMPARISON:  CT abdomen dated 11/30/2018. FINDINGS: Lower chest: Small bilateral pleural effusions, RIGHT slightly greater than LEFT, with associated passive atelectasis. Hepatobiliary: No focal liver abnormality is seen. No gallstones, gallbladder wall thickening, or biliary dilatation. Pancreas: Unremarkable. No pancreatic ductal dilatation or surrounding inflammatory  changes. Spleen: Normal in size without focal abnormality. Adrenals/Urinary Tract: Adrenal glands appear normal. Mild RIGHT-sided hydronephrosis and moderate RIGHT-sided hydroureter. RIGHT periureteral inflammation/fluid stranding and thickening/enhancement of the walls of the proximal RIGHT ureter. LEFT kidney is unremarkable without mass, stone or hydronephrosis. LEFT ureter appears grossly normal. Bladder is decompressed by Foley catheter. Hyperdense material at the bladder base, contiguous with the underlying prostate gland versus intraluminal bladder wall mass or blood products. Stomach/Bowel: The large bowel is decompressed throughout. Appendix is normal. Distal small bowel is decompressed. Remainder of the small bowel is mildly to moderately distended with fluid and air with ileus being more likely than mechanical obstruction given the lack of transition zone. Stomach is also distended, moderate to severe in degree. Decreased thickening of the walls of the 2/3 portions of duodenum. The periduodenal fluid and/or phlegmonous change in the retroperitoneum is slightly less prominent. Vascular/Lymphatic: Aortic atherosclerosis. No acute appearing vascular abnormality. No enlarged lymph nodes seen in the abdomen or  pelvis. Reproductive: Prostate gland is enlarged. Other: No circumscribed abscess collection seen. Small amount of free fluid in the lower pelvis. No free intraperitoneal air. Musculoskeletal: No acute or suspicious osseous finding. Degenerative spondylosis of the thoracolumbar spine, mild to moderate in degree. Anasarca within the subcutaneous soft tissues of the lower abdomen and pelvis. IMPRESSION: 1. Decreased thickening of the walls of the 2/3 portions of the duodenum suggesting interval improvement. The periduodenal fluid and/or phlegmonous change in the retroperitoneum is also slightly less prominent. No circumscribed abscess collection seen. No free intraperitoneal air to suggest perforation. 2. Stable mild RIGHT-sided hydronephrosis and moderate RIGHT-sided hydroureter, with associated RIGHT periureteral inflammation/fluid stranding and thickening/enhancement of the walls of the proximal RIGHT ureter. This is likely reactive to the adjacent periduodenal fluid and phlegmonous change. Cannot exclude secondary ureteral infection. 3. Bladder is decompressed by Foley catheter. Hyperdense material at the bladder base, contiguous with the underlying prostate gland versus intraluminal bladder wall mass or blood products. Recommend Urology consultation for possible cystoscopy. 4. Majority of the small bowel is mildly to moderately distended with fluid and air. Stomach is also distended, moderate to severe in degree. Findings are most suggestive of ileus, less likely small bowel obstruction without definable transition zone. 5. Anasarca within the subcutaneous soft tissues of the lower abdomen and pelvis. 6. Small bilateral pleural effusions, RIGHT slightly greater than LEFT, with associated passive atelectasis. 7. Aortic atherosclerosis. Aortic Atherosclerosis (ICD10-I70.0). Electronically Signed   By: Franki Cabot M.D.   On: 12/03/2018 11:27   Dg Chest Portable 1 View  Result Date: 11/30/2018 CLINICAL DATA:   Shortness of breath EXAM: PORTABLE CHEST 1 VIEW COMPARISON:  06/17/2014 FINDINGS: Low lung volumes. Linear atelectasis in the right upper lobe. Cardiomegaly. Possible tiny pleural effusions. No pneumothorax. IMPRESSION: Low lung volumes.  Cardiomegaly with possible tiny effusions. Electronically Signed   By: Donavan Foil M.D.   On: 11/30/2018 16:05   Dg Knee Complete 4 Views Right  Result Date: 11/30/2018 CLINICAL DATA:  Gas on CT, right knee replacement 11/21/2018 EXAM: RIGHT KNEE - COMPLETE 4+ VIEW COMPARISON:  None. 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 There is extensive soft tissue swelling and edema with overlying skin thickening. Large right knee joint effusion is present. Hardware remains appropriately aligned. No periprosthetic fracture is seen. Few radiodensities about the joint line may be postsurgical in nature. Radiolucent screw tracks are noted in the proximal tibia and likely reflect prior hardware removal. IMPRESSION: Extensive swelling and soft tissue  edema about the right knee with large joint effusion. Fall findings may be in part postsurgical, recommend correlation with clinical findings to exclude infection. Electronically Signed   By: Lovena Le M.D.   On: 11/30/2018 20:12   Dg Femur 1v Right  Result Date: 11/30/2018 CLINICAL DATA:  Gas on CT, evaluate for further foci EXAM: RIGHT FEMUR 1 VIEW COMPARISON:  CT abdomen pelvis same day FINDINGS: Extensive soft tissue swelling extends from the right hip to the level of the right knee. The right femur is intact. Right femoral head remains normally located. Included portions of the right pelvis are unremarkable. No visible soft tissue gas on this radiographic exam. Patient is post total right knee arthroplasty. IMPRESSION: 1. Extensive soft tissue swelling extending from the right hip to the level of the right knee. No additional foci of subcutaneous gas. This is greater than expected  for the postsurgical state. Absence of gas does not necessarily exclude the presence of soft tissue infection which is a clinical diagnosis. Recommend correlation with exam findings and laboratory markers such as LRINEC score. 2. No acute osseous abnormality.  Hardware intact. Electronically Signed   By: Lovena Le M.D.   On: 11/30/2018 20:15    Microbiology: Recent Results (from the past 240 hour(s))  C difficile quick scan w PCR reflex     Status: None   Collection Time: 12/03/18  5:42 AM   Specimen: STOOL  Result Value Ref Range Status   C Diff antigen NEGATIVE NEGATIVE Final   C Diff toxin NEGATIVE NEGATIVE Final   C Diff interpretation No C. difficile detected.  Final    Comment: Performed at [facility], [place] [address] [place], [place] [address]  Urine Culture     Status: None   Collection Time: 12/03/18 12:13 PM   Specimen: Urine, Catheterized  Result Value Ref Range Status   Specimen Description   Final    URINE, CATHETERIZED Performed at [facility] [address] [place], [place] [address]    Special Requests   Final    Normal Performed at [facility], [place] [address], [place], [place] [address]    Culture   Final    NO GROWTH Performed at [facility], [place] [address], [place], [place] [address]    Report Status 12/04/2018 FINAL  Final     Labs: Basic Metabolic Panel: Recent Labs  Lab 12/06/18 0217 12/07/18 0223 12/08/18 0214 12/09/18 0209 12/10/18 0239 12/11/18 0538  NA 138 137 136 135 137 134*  K 4.4 4.6 4.8 4.1 4.3 3.7  CL 107 107 104 104 104 101  CO2 24 23 19* 21* 23 22  GLUCOSE 113* 112* 105* 128* 106* 110*  BUN 10 7* '11 19 15 14  '$ CREATININE 0.90 0.73 0.75 0.83 0.77 0.82  CALCIUM 8.5* 8.9 9.7 9.1 9.1 8.8*  MG 2.0 2.1 2.0 1.9  --  1.6*   Liver Function Tests: Recent Labs  Lab 12/05/18 0216  AST 20  ALT 30  ALKPHOS 45  BILITOT 0.8  PROT 6.3*  ALBUMIN 2.7*   No results for  input(s): LIPASE, AMYLASE in the last 168 hours. No results for input(s): AMMONIA in the last 168 hours. CBC: Recent Labs  Lab 12/05/18 0216 12/05/18 1927 12/06/18 0217 12/08/18 0214 12/09/18 0209  WBC 15.0*  --  14.2* 13.3* 14.1*  NEUTROABS  --   --   --  8.9*  --   HGB 10.9* 12.5*  12.1* 13.4 13.0  HCT 34.7* 39.1 37.8* 42.2 41.3  MCV 94.0  --  93.6 94.8 94.7  PLT 414*  --  413* 468* 354   Cardiac Enzymes: No results for input(s): CKTOTAL, CKMB, CKMBINDEX, TROPONINI in the last 168 hours. BNP: BNP (last 3 results) No results for input(s): BNP in the last 8760 hours.  ProBNP (last 3 results) No results for input(s): PROBNP in the last 8760 hours.  CBG: No results for input(s): GLUCAP in the last 168 hours.     Signed:  Irine Seal MD.  [facility] 12/11/2018, 12:39 PM

## 2018-12-11 NOTE — Plan of Care (Signed)
  Problem: Education: Goal: Knowledge of disease or condition will improve Outcome: Progressing Goal: Understanding of medication regimen will improve Outcome: Progressing   Problem: Cardiac: Goal: Ability to achieve and maintain adequate cardiopulmonary perfusion will improve Outcome: Progressing   Problem: Clinical Measurements: Goal: Diagnostic test results will improve Outcome: Progressing Goal: Respiratory complications will improve Outcome: Progressing Goal: Cardiovascular complication will be avoided Outcome: Progressing   Problem: Coping: Goal: Level of anxiety will decrease Outcome: Progressing

## 2018-12-12 ENCOUNTER — Telehealth: Payer: Self-pay | Admitting: *Deleted

## 2018-12-12 DIAGNOSIS — R339 Retention of urine, unspecified: Secondary | ICD-10-CM | POA: Diagnosis not present

## 2018-12-12 DIAGNOSIS — Z466 Encounter for fitting and adjustment of urinary device: Secondary | ICD-10-CM | POA: Diagnosis not present

## 2018-12-12 NOTE — Telephone Encounter (Signed)
S/w pt is aware appt has been moved to same day but in person, not telehealth.  This was approved by ML/BC.

## 2018-12-13 ENCOUNTER — Encounter: Payer: Self-pay | Admitting: Physical Therapy

## 2018-12-13 ENCOUNTER — Encounter: Payer: BC Managed Care – PPO | Admitting: Physical Therapy

## 2018-12-13 ENCOUNTER — Ambulatory Visit: Payer: BC Managed Care – PPO | Admitting: Physical Therapy

## 2018-12-13 ENCOUNTER — Other Ambulatory Visit: Payer: Self-pay

## 2018-12-13 DIAGNOSIS — R262 Difficulty in walking, not elsewhere classified: Secondary | ICD-10-CM | POA: Diagnosis not present

## 2018-12-13 DIAGNOSIS — M25561 Pain in right knee: Secondary | ICD-10-CM | POA: Diagnosis not present

## 2018-12-13 DIAGNOSIS — R600 Localized edema: Secondary | ICD-10-CM

## 2018-12-13 DIAGNOSIS — M25661 Stiffness of right knee, not elsewhere classified: Secondary | ICD-10-CM
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
## 2018-12-15 ENCOUNTER — Encounter: Payer: Self-pay | Admitting: Physical Therapy

## 2018-12-15 ENCOUNTER — Ambulatory Visit: Payer: BC Managed Care – PPO | Admitting: Physical Therapy

## 2018-12-15 ENCOUNTER — Encounter: Payer: BC Managed Care – PPO | Admitting: Physical Therapy

## 2018-12-15 ENCOUNTER — Other Ambulatory Visit: Payer: Self-pay

## 2018-12-15 DIAGNOSIS — M25561 Pain in right knee: Secondary | ICD-10-CM

## 2018-12-15 DIAGNOSIS — R600 Localized edema: Secondary | ICD-10-CM

## 2018-12-15 DIAGNOSIS — M25661 Stiffness of right knee, not elsewhere classified: Secondary | ICD-10-CM

## 2018-12-15 DIAGNOSIS — R262 Difficulty in walking, not elsewhere classified: Secondary | ICD-10-CM | POA: Diagnosis not present

## 2018-12-15 DIAGNOSIS — G4733 Obstructive sleep apnea (adult) (pediatric): Secondary | ICD-10-CM | POA: Diagnosis not present
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
## 2018-12-18 ENCOUNTER — Encounter: Payer: Self-pay | Admitting: Physical Therapy

## 2018-12-18 ENCOUNTER — Other Ambulatory Visit: Payer: Self-pay

## 2018-12-18 ENCOUNTER — Ambulatory Visit: Payer: BC Managed Care – PPO | Admitting: Physical Therapy

## 2018-12-18 ENCOUNTER — Encounter: Payer: BC Managed Care – PPO | Admitting: Physical Therapy

## 2018-12-18 DIAGNOSIS — M25661 Stiffness of right knee, not elsewhere classified: Secondary | ICD-10-CM

## 2018-12-18 DIAGNOSIS — R600 Localized edema: Secondary | ICD-10-CM | POA: Diagnosis not present

## 2018-12-18 DIAGNOSIS — R262 Difficulty in walking, not elsewhere classified: Secondary | ICD-10-CM | POA: Diagnosis not present

## 2018-12-18 DIAGNOSIS — M25561 Pain in right knee: Secondary | ICD-10-CM | POA: Diagnosis not present
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
## 2018-12-19 ENCOUNTER — Telehealth: Payer: Self-pay | Admitting: Gastroenterology

## 2018-12-19 ENCOUNTER — Ambulatory Visit: Payer: BC Managed Care – PPO | Admitting: Physician Assistant

## 2018-12-19 ENCOUNTER — Telehealth: Payer: BC Managed Care – PPO | Admitting: Nurse Practitioner

## 2018-12-19 ENCOUNTER — Encounter: Payer: Self-pay | Admitting: Physician Assistant

## 2018-12-19 VITALS — BP 126/74 | HR 95 | Ht 69.0 in | Wt 189.8 lb

## 2018-12-19 DIAGNOSIS — I10 Essential (primary) hypertension: Secondary | ICD-10-CM

## 2018-12-19 DIAGNOSIS — E785 Hyperlipidemia, unspecified: Secondary | ICD-10-CM

## 2018-12-19 DIAGNOSIS — I471 Supraventricular tachycardia: Secondary | ICD-10-CM

## 2018-12-19 DIAGNOSIS — G4733 Obstructive sleep apnea (adult) (pediatric): Secondary | ICD-10-CM | POA: Diagnosis not present

## 2018-12-19 DIAGNOSIS — I480 Paroxysmal atrial fibrillation: Secondary | ICD-10-CM

## 2018-12-19 MED ORDER — METOPROLOL SUCCINATE ER 100 MG PO TB24: 100.0000 mg | ORAL_TABLET | Freq: Every day | ORAL | 3 refills | Status: DC

## 2018-12-19 NOTE — Patient Instructions (Addendum)
Medication Instructions:  Your physician has recommended you make the following change in your medication:   INCREASE: metoprolol succinate (Toprol-XL) to 100 mg by mouth once a day  Lab work: None Ordered  If you have labs (blood work) drawn today and your tests are completely normal, you will receive your results only by: Marland Kitchen MyChart Message (if you have MyChart) OR . A paper copy in the mail If you have any lab test that is abnormal or we need to change your treatment, we will call you to review the results.  Testing/Procedures: None ordered  Follow-Up: . Keep appointment with Dr. Lovena Le on 01/15/19 at 11:00 AM . Follow up with Dr. Tamala Julian on 02/28/19 at 4:00 PM  Any Other Special Instructions Will Be Listed Below (If Applicable).

## 2018-12-20 ENCOUNTER — Ambulatory Visit: Payer: BC Managed Care – PPO | Attending: Student | Admitting: Physical Therapy

## 2018-12-20 ENCOUNTER — Encounter: Payer: Self-pay | Admitting: Physical Therapy

## 2018-12-20 ENCOUNTER — Encounter: Payer: BC Managed Care – PPO | Admitting: Physical Therapy

## 2018-12-20 ENCOUNTER — Other Ambulatory Visit: Payer: Self-pay

## 2018-12-20 DIAGNOSIS — R600 Localized edema: Secondary | ICD-10-CM | POA: Diagnosis not present

## 2018-12-20 DIAGNOSIS — M25661 Stiffness of right knee, not elsewhere classified: Secondary | ICD-10-CM | POA: Diagnosis not present

## 2018-12-20 DIAGNOSIS — M25561 Pain in right knee: Secondary | ICD-10-CM | POA: Insufficient documentation
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
## 2018-12-22 ENCOUNTER — Telehealth: Payer: Self-pay | Admitting: Interventional Cardiology

## 2018-12-22 ENCOUNTER — Emergency Department (HOSPITAL_COMMUNITY): Payer: BC Managed Care – PPO

## 2018-12-22 ENCOUNTER — Other Ambulatory Visit: Payer: Self-pay

## 2018-12-22 ENCOUNTER — Encounter (HOSPITAL_COMMUNITY): Payer: Self-pay | Admitting: Radiology

## 2018-12-22 ENCOUNTER — Ambulatory Visit: Payer: BC Managed Care – PPO | Admitting: Physical Therapy

## 2018-12-22 ENCOUNTER — Inpatient Hospital Stay (HOSPITAL_COMMUNITY)
Admission: EM | Admit: 2018-12-22 | Discharge: 2019-01-01 | DRG: 653 | Disposition: A | Discharge status: Home / Self Care | Payer: BC Managed Care – PPO | Attending: Internal Medicine | Admitting: Internal Medicine

## 2018-12-22 DIAGNOSIS — Y731 Therapeutic (nonsurgical) and rehabilitative gastroenterology and urology devices associated with adverse incidents: Secondary | ICD-10-CM | POA: Diagnosis present

## 2018-12-22 DIAGNOSIS — R140 Abdominal distension (gaseous): Secondary | ICD-10-CM

## 2018-12-22 DIAGNOSIS — N320 Bladder-neck obstruction: Secondary | ICD-10-CM | POA: Diagnosis not present

## 2018-12-22 DIAGNOSIS — R310 Gross hematuria: Secondary | ICD-10-CM | POA: Diagnosis present

## 2018-12-22 DIAGNOSIS — R600 Localized edema: Secondary | ICD-10-CM | POA: Diagnosis not present

## 2018-12-22 DIAGNOSIS — R739 Hyperglycemia, unspecified: Secondary | ICD-10-CM | POA: Diagnosis present

## 2018-12-22 DIAGNOSIS — R531 Weakness: Secondary | ICD-10-CM | POA: Diagnosis not present

## 2018-12-22 DIAGNOSIS — I959 Hypotension, unspecified: Secondary | ICD-10-CM | POA: Diagnosis present

## 2018-12-22 DIAGNOSIS — I480 Paroxysmal atrial fibrillation: Secondary | ICD-10-CM | POA: Diagnosis present

## 2018-12-22 DIAGNOSIS — Z8619 Personal history of other infectious and parasitic diseases: Secondary | ICD-10-CM

## 2018-12-22 DIAGNOSIS — Z96651 Presence of right artificial knee joint: Secondary | ICD-10-CM | POA: Diagnosis present

## 2018-12-22 DIAGNOSIS — E785 Hyperlipidemia, unspecified: Secondary | ICD-10-CM | POA: Diagnosis present

## 2018-12-22 DIAGNOSIS — Z471 Aftercare following joint replacement surgery: Secondary | ICD-10-CM | POA: Diagnosis not present

## 2018-12-22 DIAGNOSIS — R338 Other retention of urine: Secondary | ICD-10-CM | POA: Diagnosis not present

## 2018-12-22 DIAGNOSIS — Z20828 Contact with and (suspected) exposure to other viral communicable diseases: Secondary | ICD-10-CM | POA: Diagnosis present

## 2018-12-22 DIAGNOSIS — R578 Other shock: Secondary | ICD-10-CM | POA: Diagnosis present

## 2018-12-22 DIAGNOSIS — N179 Acute kidney failure, unspecified: Secondary | ICD-10-CM | POA: Diagnosis not present

## 2018-12-22 DIAGNOSIS — T8383XA Hemorrhage of genitourinary prosthetic devices, implants and grafts, initial encounter: Principal | ICD-10-CM | POA: Diagnosis present

## 2018-12-22 DIAGNOSIS — Z87820 Personal history of traumatic brain injury: Secondary | ICD-10-CM

## 2018-12-22 DIAGNOSIS — N136 Pyonephrosis: Secondary | ICD-10-CM | POA: Diagnosis present

## 2018-12-22 DIAGNOSIS — N401 Enlarged prostate with lower urinary tract symptoms: Secondary | ICD-10-CM | POA: Diagnosis not present

## 2018-12-22 DIAGNOSIS — R001 Bradycardia, unspecified: Secondary | ICD-10-CM | POA: Diagnosis not present

## 2018-12-22 DIAGNOSIS — K219 Gastro-esophageal reflux disease without esophagitis: Secondary | ICD-10-CM | POA: Diagnosis present

## 2018-12-22 DIAGNOSIS — R319 Hematuria, unspecified: Secondary | ICD-10-CM | POA: Diagnosis not present

## 2018-12-22 DIAGNOSIS — Y846 Urinary catheterization as the cause of abnormal reaction of the patient, or of later complication, without mention of misadventure at the time of the procedure: Secondary | ICD-10-CM | POA: Diagnosis present

## 2018-12-22 DIAGNOSIS — M25561 Pain in right knee: Secondary | ICD-10-CM | POA: Diagnosis not present

## 2018-12-22 DIAGNOSIS — I471 Supraventricular tachycardia: Secondary | ICD-10-CM | POA: Diagnosis not present

## 2018-12-22 DIAGNOSIS — N3289 Other specified disorders of bladder: Secondary | ICD-10-CM | POA: Diagnosis present

## 2018-12-22 DIAGNOSIS — N1330 Unspecified hydronephrosis: Secondary | ICD-10-CM | POA: Diagnosis not present

## 2018-12-22 DIAGNOSIS — B9620 Unspecified Escherichia coli [E. coli] as the cause of diseases classified elsewhere: Secondary | ICD-10-CM | POA: Diagnosis not present

## 2018-12-22 DIAGNOSIS — Z79899 Other long term (current) drug therapy: Secondary | ICD-10-CM

## 2018-12-22 DIAGNOSIS — D509 Iron deficiency anemia, unspecified: Secondary | ICD-10-CM | POA: Diagnosis not present

## 2018-12-22 DIAGNOSIS — I4581 Long QT syndrome: Secondary | ICD-10-CM | POA: Diagnosis present

## 2018-12-22 DIAGNOSIS — R0902 Hypoxemia: Secondary | ICD-10-CM | POA: Diagnosis not present

## 2018-12-22 DIAGNOSIS — Z03818 Encounter for observation for suspected exposure to other biological agents ruled out: Secondary | ICD-10-CM | POA: Diagnosis not present

## 2018-12-22 DIAGNOSIS — R579 Shock, unspecified: Secondary | ICD-10-CM

## 2018-12-22 DIAGNOSIS — N3281 Overactive bladder: Secondary | ICD-10-CM | POA: Diagnosis not present

## 2018-12-22 DIAGNOSIS — R52 Pain, unspecified: Secondary | ICD-10-CM | POA: Diagnosis not present

## 2018-12-22 DIAGNOSIS — I10 Essential (primary) hypertension: Secondary | ICD-10-CM | POA: Diagnosis not present

## 2018-12-22 DIAGNOSIS — R55 Syncope and collapse: Secondary | ICD-10-CM | POA: Diagnosis present

## 2018-12-22 DIAGNOSIS — G4733 Obstructive sleep apnea (adult) (pediatric): Secondary | ICD-10-CM | POA: Diagnosis present

## 2018-12-22 DIAGNOSIS — N400 Enlarged prostate without lower urinary tract symptoms: Secondary | ICD-10-CM | POA: Diagnosis not present

## 2018-12-22 DIAGNOSIS — Z87891 Personal history of nicotine dependence: Secondary | ICD-10-CM

## 2018-12-22 DIAGNOSIS — Z7901 Long term (current) use of anticoagulants: Secondary | ICD-10-CM | POA: Diagnosis not present

## 2018-12-22 DIAGNOSIS — K5730 Diverticulosis of large intestine without perforation or abscess without bleeding: Secondary | ICD-10-CM | POA: Diagnosis not present

## 2018-12-22 DIAGNOSIS — D62 Acute posthemorrhagic anemia: Secondary | ICD-10-CM | POA: Diagnosis present

## 2018-12-22 DIAGNOSIS — R339 Retention of urine, unspecified: Secondary | ICD-10-CM | POA: Diagnosis not present

## 2018-12-22 DIAGNOSIS — E1165 Type 2 diabetes mellitus with hyperglycemia: Secondary | ICD-10-CM | POA: Diagnosis not present

## 2018-12-22 DIAGNOSIS — M25661 Stiffness of right knee, not elsewhere classified: Secondary | ICD-10-CM | POA: Diagnosis not present

## 2018-12-22 DIAGNOSIS — Z886 Allergy status to analgesic agent status: Secondary | ICD-10-CM

## 2018-12-22 DIAGNOSIS — Z8719 Personal history of other diseases of the digestive system: Secondary | ICD-10-CM

## 2018-12-22 LAB — CBC WITH DIFFERENTIAL/PLATELET
Abs Immature Granulocytes: 0.2 10*3/uL — ABNORMAL HIGH (ref 0.00–0.07)
Basophils Absolute: 0.1 10*3/uL (ref 0.0–0.1)
Basophils Relative: 0 %
Eosinophils Absolute: 0 10*3/uL (ref 0.0–0.5)
Eosinophils Relative: 0 %
HCT: 28.1 % — ABNORMAL LOW (ref 39.0–52.0)
Hemoglobin: 8.6 g/dL — ABNORMAL LOW (ref 13.0–17.0)
Immature Granulocytes: 1 %
Lymphocytes Relative: 9 %
Lymphs Abs: 1.9 10*3/uL (ref 0.7–4.0)
MCH: 29.4 pg (ref 26.0–34.0)
MCHC: 30.6 g/dL (ref 30.0–36.0)
MCV: 95.9 fL (ref 80.0–100.0)
Monocytes Absolute: 1.4 10*3/uL — ABNORMAL HIGH (ref 0.1–1.0)
Monocytes Relative: 7 %
Neutro Abs: 17.2 10*3/uL — ABNORMAL HIGH (ref 1.7–7.7)
Neutrophils Relative %: 83 %
Platelets: 381 10*3/uL (ref 150–400)
RBC: 2.93 MIL/uL — ABNORMAL LOW (ref 4.22–5.81)
RDW: 13.2 % (ref 11.5–15.5)
WBC: 20.7 10*3/uL — ABNORMAL HIGH (ref 4.0–10.5)
nRBC: 0 % (ref 0.0–0.2)

## 2018-12-22 LAB — URINALYSIS, ROUTINE W REFLEX MICROSCOPIC
Bacteria, UA: NONE SEEN
Bilirubin Urine: NEGATIVE
Glucose, UA: 150 mg/dL — AB
Ketones, ur: NEGATIVE mg/dL
Leukocytes,Ua: NEGATIVE
Nitrite: NEGATIVE
Protein, ur: >=300 mg/dL — AB
RBC / HPF: >50 RBC/hpf — ABNORMAL HIGH (ref 0–5)
Specific Gravity, Urine: 1.033 — ABNORMAL HIGH (ref 1.005–1.030)
pH: 7 (ref 5.0–8.0)

## 2018-12-22 LAB — COMPREHENSIVE METABOLIC PANEL
ALT: 15 U/L (ref 0–44)
AST: 19 U/L (ref 15–41)
Albumin: 2.8 g/dL — ABNORMAL LOW (ref 3.5–5.0)
Alkaline Phosphatase: 59 U/L (ref 38–126)
Anion gap: 10 (ref 5–15)
BUN: 16 mg/dL (ref 8–23)
CO2: 20 mmol/L — ABNORMAL LOW (ref 22–32)
Calcium: 8.4 mg/dL — ABNORMAL LOW (ref 8.9–10.3)
Chloride: 107 mmol/L (ref 98–111)
Creatinine, Ser: 1.21 mg/dL (ref 0.61–1.24)
GFR calc Af Amer: >60 mL/min (ref >60)
GFR calc non Af Amer: >60 mL/min (ref >60)
Glucose, Bld: 182 mg/dL — ABNORMAL HIGH (ref 70–99)
Potassium: 4.2 mmol/L (ref 3.5–5.1)
Sodium: 137 mmol/L (ref 135–145)
Total Bilirubin: 0.6 mg/dL (ref 0.3–1.2)
Total Protein: 6.1 g/dL — ABNORMAL LOW (ref 6.5–8.1)

## 2018-12-22 LAB — SARS CORONAVIRUS 2 BY RT PCR (HOSPITAL ORDER, PERFORMED IN ~~LOC~~ HOSPITAL LAB): SARS Coronavirus 2: NEGATIVE

## 2018-12-22 LAB — I-STAT CHEM 8, ED
BUN: 14 mg/dL (ref 8–23)
Calcium, Ion: 1.07 mmol/L — ABNORMAL LOW (ref 1.15–1.40)
Chloride: 105 mmol/L (ref 98–111)
Creatinine, Ser: 1.1 mg/dL (ref 0.61–1.24)
Glucose, Bld: 178 mg/dL — ABNORMAL HIGH (ref 70–99)
HCT: 27 % — ABNORMAL LOW (ref 39.0–52.0)
Hemoglobin: 9.2 g/dL — ABNORMAL LOW (ref 13.0–17.0)
Potassium: 4.3 mmol/L (ref 3.5–5.1)
Sodium: 138 mmol/L (ref 135–145)
TCO2: 20 mmol/L — ABNORMAL LOW (ref 22–32)

## 2018-12-22 LAB — POC OCCULT BLOOD, ED: Fecal Occult Bld: NEGATIVE

## 2018-12-22 LAB — LIPASE, BLOOD: Lipase: 55 U/L — ABNORMAL HIGH (ref 11–51)

## 2018-12-22 LAB — LACTIC ACID, PLASMA: Lactic Acid, Venous: 4.7 mmol/L (ref 0.5–1.9)

## 2018-12-22 LAB — PREPARE RBC (CROSSMATCH)

## 2018-12-22 MED ORDER — LACTATED RINGERS IV BOLUS
1000.0000 mL | Freq: Once | INTRAVENOUS | Status: AC
  Administered 2018-12-22: 1000 mL via INTRAVENOUS

## 2018-12-22 MED ORDER — LACTATED RINGERS IV BOLUS: 1000.0000 mL | Freq: Once | INTRAVENOUS | Status: DC

## 2018-12-22 MED ORDER — LACTATED RINGERS IV SOLN
INTRAVENOUS | Status: DC
  Administered 2018-12-22: via INTRAVENOUS

## 2018-12-22 MED ORDER — VANCOMYCIN HCL IN DEXTROSE 1-5 GM/200ML-% IV SOLN
1000.0000 mg | Freq: Once | INTRAVENOUS | Status: AC
  Administered 2018-12-22: 1000 mg via INTRAVENOUS
  Filled 2018-12-22: qty 200

## 2018-12-22 MED ORDER — SODIUM CHLORIDE 0.9% IV SOLUTION: Freq: Once | INTRAVENOUS | Status: DC

## 2018-12-22 MED ORDER — LIDOCAINE HCL URETHRAL/MUCOSAL 2 % EX GEL
1.0000 "application " | Freq: Once | CUTANEOUS | Status: AC
  Administered 2018-12-22: 1 via URETHRAL
  Filled 2018-12-22: qty 5

## 2018-12-22 MED ORDER — PIPERACILLIN-TAZOBACTAM 3.375 G IVPB 30 MIN
3.3750 g | Freq: Once | INTRAVENOUS | Status: AC
  Administered 2018-12-22: 22:00:00 3.375 g via INTRAVENOUS
  Filled 2018-12-22: qty 50

## 2018-12-22 MED ORDER — SODIUM CHLORIDE 0.9 % IV BOLUS
1000.0000 mL | Freq: Once | INTRAVENOUS | Status: AC
  Administered 2018-12-22: 1000 mL via INTRAVENOUS

## 2018-12-22 MED ORDER — IOHEXOL 300 MG/ML  SOLN
100.0000 mL | Freq: Once | INTRAMUSCULAR | Status: AC | PRN
  Administered 2018-12-22: 21:00:00 100 mL via INTRAVENOUS

## 2018-12-22 MED ORDER — BELLADONNA ALKALOIDS-OPIUM 16.2-60 MG RE SUPP
1.0000 | Freq: Three times a day (TID) | RECTAL | Status: DC | PRN
  Administered 2018-12-23: 1 via RECTAL
  Filled 2018-12-22: qty 1

## 2018-12-22 MED ORDER — SODIUM CHLORIDE (PF) 0.9 % IJ SOLN
INTRAMUSCULAR | Status: AC
  Filled 2018-12-22: qty 50

## 2018-12-22 NOTE — Telephone Encounter (Signed)
I spoke to the patient who called because recently he has had to temporarily self-catheterize himself, post knee surgery.  He has noticed blood in the urine and wondered if this is being caused by the Eliquis.    I told him that the combination of Eliquis and trauma may be the cause.  He will monitor and keep [place] informed of progress.

## 2018-12-22 NOTE — Telephone Encounter (Signed)
New message   Patient would like to know if eliquis would cause him to bleed. Patient states that he has blood in urine when using his catheter. Please call.

## 2018-12-22 NOTE — ED Triage Notes (Signed)
Per EMS, Pt is coming from home. Pt is complaining of generalized weakness. Pt had sudden increase of existing weakness. On EMS arrival pts bp 80/62, pale and diaphoretic. Pt was seen apprx 1 wk ago for fluid retention, during visit they pulled fluid from his abd, and has had to have in and out caths to void since. Pt also reports some bleeding from urethra, and a clot come out while self cathing. Pt also had an increase in metoprolol, unknown dosage.

## 2018-12-22 NOTE — ED Notes (Addendum)
Due to BP, per MD [place], blood bolused.

## 2018-12-22 NOTE — Progress Notes (Signed)
A consult was received from an ED physician for vanc per pharmacy dosing.  The patient's profile has been reviewed for ht/wt/allergies/indication/available labs.   A one time order has been placed for vanc 1g.  Further antibiotics/pharmacy consults should be ordered by admitting physician if indicated.                       Thank you, Kara Mead 12/22/2018  9:16 PM
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
## 2018-12-22 NOTE — ED Notes (Signed)
Transfusion started at 316 ml/hr per Ronnald Nian, MD order due to patient status

## 2018-12-23 ENCOUNTER — Encounter (HOSPITAL_COMMUNITY): Payer: Self-pay | Admitting: Internal Medicine

## 2018-12-23 DIAGNOSIS — R319 Hematuria, unspecified: Secondary | ICD-10-CM

## 2018-12-23 DIAGNOSIS — I480 Paroxysmal atrial fibrillation: Secondary | ICD-10-CM | POA: Diagnosis present

## 2018-12-23 LAB — CBC
HCT: 27 % — ABNORMAL LOW (ref 39.0–52.0)
HCT: 28.1 % — ABNORMAL LOW (ref 39.0–52.0)
HCT: 25.1 % — ABNORMAL LOW (ref 39.0–52.0)
Hemoglobin: 8.5 g/dL — ABNORMAL LOW (ref 13.0–17.0)
Hemoglobin: 8.9 g/dL — ABNORMAL LOW (ref 13.0–17.0)
Hemoglobin: 8.1 g/dL — ABNORMAL LOW (ref 13.0–17.0)
MCH: 28.7 pg (ref 26.0–34.0)
MCH: 29.5 pg (ref 26.0–34.0)
MCH: 29.3 pg (ref 26.0–34.0)
MCHC: 31.5 g/dL (ref 30.0–36.0)
MCHC: 31.7 g/dL (ref 30.0–36.0)
MCHC: 32.3 g/dL (ref 30.0–36.0)
MCV: 91.2 fL (ref 80.0–100.0)
MCV: 93 fL (ref 80.0–100.0)
MCV: 90.9 fL (ref 80.0–100.0)
Platelets: 337 10*3/uL (ref 150–400)
Platelets: 288 10*3/uL (ref 150–400)
Platelets: 261 10*3/uL (ref 150–400)
RBC: 2.96 MIL/uL — ABNORMAL LOW (ref 4.22–5.81)
RBC: 3.02 MIL/uL — ABNORMAL LOW (ref 4.22–5.81)
RBC: 2.76 MIL/uL — ABNORMAL LOW (ref 4.22–5.81)
RDW: 15.5 % (ref 11.5–15.5)
RDW: 15.3 % (ref 11.5–15.5)
RDW: 15.2 % (ref 11.5–15.5)
WBC: 22.1 10*3/uL — ABNORMAL HIGH (ref 4.0–10.5)
WBC: 18.7 10*3/uL — ABNORMAL HIGH (ref 4.0–10.5)
WBC: 15.2 10*3/uL — ABNORMAL HIGH (ref 4.0–10.5)
nRBC: 0 % (ref 0.0–0.2)
nRBC: 0 % (ref 0.0–0.2)
nRBC: 0 % (ref 0.0–0.2)

## 2018-12-23 LAB — COMPREHENSIVE METABOLIC PANEL
ALT: 15 U/L (ref 0–44)
AST: 14 U/L — ABNORMAL LOW (ref 15–41)
Albumin: 2.6 g/dL — ABNORMAL LOW (ref 3.5–5.0)
Alkaline Phosphatase: 51 U/L (ref 38–126)
Anion gap: 12 (ref 5–15)
BUN: 17 mg/dL (ref 8–23)
CO2: 17 mmol/L — ABNORMAL LOW (ref 22–32)
Calcium: 8 mg/dL — ABNORMAL LOW (ref 8.9–10.3)
Chloride: 107 mmol/L (ref 98–111)
Creatinine, Ser: 1.27 mg/dL — ABNORMAL HIGH (ref 0.61–1.24)
GFR calc Af Amer: >60 mL/min (ref >60)
GFR calc non Af Amer: 58 mL/min — ABNORMAL LOW (ref >60)
Glucose, Bld: 219 mg/dL — ABNORMAL HIGH (ref 70–99)
Potassium: 4.3 mmol/L (ref 3.5–5.1)
Sodium: 136 mmol/L (ref 135–145)
Total Bilirubin: 0.8 mg/dL (ref 0.3–1.2)
Total Protein: 5.5 g/dL — ABNORMAL LOW (ref 6.5–8.1)

## 2018-12-23 LAB — MRSA PCR SCREENING: MRSA by PCR: NEGATIVE

## 2018-12-23 LAB — PREPARE RBC (CROSSMATCH)

## 2018-12-23 LAB — LACTIC ACID, PLASMA
Lactic Acid, Venous: 3.6 mmol/L (ref 0.5–1.9)
Lactic Acid, Venous: 2.4 mmol/L (ref 0.5–1.9)

## 2018-12-23 LAB — PROCALCITONIN: Procalcitonin: <0.1 ng/mL

## 2018-12-23 MED ORDER — METOPROLOL TARTRATE 5 MG/5ML IV SOLN
5.0000 mg | Freq: Once | INTRAVENOUS | Status: AC
  Administered 2018-12-23: 5 mg via INTRAVENOUS
  Filled 2018-12-23: qty 5

## 2018-12-23 MED ORDER — PANTOPRAZOLE SODIUM 40 MG PO TBEC
40.0000 mg | DELAYED_RELEASE_TABLET | Freq: Every day | ORAL | Status: DC
  Administered 2018-12-23 – 2019-01-01 (×10): 40 mg via ORAL
  Filled 2018-12-23 (×10): qty 1

## 2018-12-23 MED ORDER — ACETAMINOPHEN 325 MG PO TABS
650.0000 mg | ORAL_TABLET | Freq: Four times a day (QID) | ORAL | Status: DC | PRN
  Filled 2018-12-23: qty 2

## 2018-12-23 MED ORDER — LACTATED RINGERS IV SOLN
INTRAVENOUS | Status: AC
  Administered 2018-12-23 (×3): via INTRAVENOUS

## 2018-12-23 MED ORDER — ONDANSETRON HCL 4 MG/2ML IJ SOLN: 4.0000 mg | Freq: Four times a day (QID) | INTRAMUSCULAR | Status: DC | PRN

## 2018-12-23 MED ORDER — SODIUM CHLORIDE 0.9% IV SOLUTION
Freq: Once | INTRAVENOUS | Status: AC
  Administered 2018-12-23: 03:00:00 via INTRAVENOUS

## 2018-12-23 MED ORDER — METOPROLOL SUCCINATE ER 50 MG PO TB24
100.0000 mg | ORAL_TABLET | Freq: Every day | ORAL | Status: DC
  Administered 2018-12-23 – 2019-01-01 (×10): 100 mg via ORAL
  Filled 2018-12-23 (×2): qty 4
  Filled 2018-12-23: qty 2
  Filled 2018-12-23 (×2): qty 4
  Filled 2018-12-23: qty 2
  Filled 2018-12-23: qty 4
  Filled 2018-12-23: qty 2
  Filled 2018-12-23 (×3): qty 4

## 2018-12-23 MED ORDER — ONDANSETRON HCL 4 MG PO TABS: 4.0000 mg | ORAL_TABLET | Freq: Four times a day (QID) | ORAL | Status: DC | PRN

## 2018-12-23 MED ORDER — OMEGA-3-ACID ETHYL ESTERS 1 G PO CAPS
1.0000 g | ORAL_CAPSULE | Freq: Two times a day (BID) | ORAL | Status: DC
  Administered 2018-12-23 – 2019-01-01 (×19): 1 g via ORAL
  Filled 2018-12-23 (×20): qty 1

## 2018-12-23 MED ORDER — ROSUVASTATIN CALCIUM 10 MG PO TABS
20.0000 mg | ORAL_TABLET | Freq: Every day | ORAL | Status: DC
  Administered 2018-12-23 – 2018-12-31 (×9): 20 mg via ORAL
  Filled 2018-12-23 (×4): qty 1
  Filled 2018-12-23 (×3): qty 2
  Filled 2018-12-23 (×2): qty 1

## 2018-12-23 MED ORDER — SENNOSIDES-DOCUSATE SODIUM 8.6-50 MG PO TABS
1.0000 | ORAL_TABLET | Freq: Two times a day (BID) | ORAL | Status: DC
  Administered 2018-12-23 – 2019-01-01 (×19): 1 via ORAL
  Filled 2018-12-23 (×20): qty 1

## 2018-12-23 MED ORDER — ACETAMINOPHEN 650 MG RE SUPP: 650.0000 mg | Freq: Four times a day (QID) | RECTAL | Status: DC | PRN

## 2018-12-23 MED ORDER — PIPERACILLIN-TAZOBACTAM 3.375 G IVPB
3.3750 g | Freq: Three times a day (TID) | INTRAVENOUS | Status: DC
  Administered 2018-12-23 – 2018-12-24 (×5): 3.375 g via INTRAVENOUS
  Filled 2018-12-23 (×5): qty 50

## 2018-12-23 MED ORDER — CHLORHEXIDINE GLUCONATE CLOTH 2 % EX PADS
6.0000 | MEDICATED_PAD | Freq: Every day | CUTANEOUS | Status: DC
  Administered 2018-12-24 – 2019-01-01 (×8): 6 via TOPICAL

## 2018-12-23 MED ORDER — PHENAZOPYRIDINE HCL 200 MG PO TABS
200.0000 mg | ORAL_TABLET | Freq: Three times a day (TID) | ORAL | Status: DC
  Administered 2018-12-23 – 2018-12-24 (×3): 200 mg via ORAL
  Filled 2018-12-23 (×4): qty 1

## 2018-12-23 MED ORDER — OXYBUTYNIN CHLORIDE 5 MG PO TABS
5.0000 mg | ORAL_TABLET | Freq: Three times a day (TID) | ORAL | Status: DC | PRN
  Administered 2018-12-23 – 2018-12-31 (×13): 5 mg via ORAL
  Filled 2018-12-23 (×13): qty 1

## 2018-12-23 MED ORDER — FERROUS SULFATE 325 (65 FE) MG PO TABS
325.0000 mg | ORAL_TABLET | ORAL | Status: DC
  Administered 2018-12-25 – 2019-01-01 (×3): 325 mg via ORAL
  Filled 2018-12-23 (×3): qty 1

## 2018-12-23 NOTE — Telephone Encounter (Signed)
Thanks
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
## 2018-12-23 NOTE — Plan of Care (Signed)
  Problem: Education: Goal: Knowledge of General Education information will improve Description Including pain rating scale, medication(s)/side effects and non-pharmacologic comfort measures Outcome: Progressing   

## 2018-12-23 NOTE — Progress Notes (Signed)
Pharmacy Antibiotic Note  Philip Jackson is a 68 y.o. male admitted on 12/22/2018 with sepsis.  Pharmacy has been consulted for zosyn dosing.  Plan: Zosyn 3.375g IV q8h (4 hour infusion). F/u scr/cultures  Height: 5\' 9"  (175.3 cm) Weight: 189 lb (85.7 kg) IBW/kg (Calculated) : 70.7  Temp (24hrs), Avg:98.6 F (37 C), Min:97.4 F (36.3 C), Max:99.8 F (37.7 C)  Recent Labs  Lab 12/22/18 2016 12/22/18 2019  WBC 20.7*  --   CREATININE 1.21 1.10  LATICACIDVEN 4.7*  --     Estimated Creatinine Clearance: 69.7 mL/min (by C-G formula based on SCr of 1.1 mg/dL).    Allergies  Allergen Reactions  . 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 Nsaids     Duodenal ulcer.  anticoagulated    Antimicrobials this admission: 9/4 zosyn >>    >>   Dose adjustments this admission:   Microbiology results:  BCx:   UCx:    Sputum:    MRSA PCR:   Thank you for allowing pharmacy to be a part of this patient's care.  Dorrene German 12/23/2018 2:05 AM

## 2018-12-23 NOTE — Progress Notes (Addendum)
Patient ID: Philip Jackson, male   DOB: 26-Oct-1950, 68 y.o.   MRN: [id number]    Subjective: Did not require hand irrigation overnight.  Still with bladder spasms despite B and O suppositories.  Objective: Vital signs in last 24 hours: Temp:  [97.4 F (36.3 C)-99.8 F (37.7 C)] 97.4 F (36.3 C) (09/05 0812) Pulse Rate:  [87-141] 96 (09/05 0900) Resp:  [16-47] 18 (09/05 0900) BP: (57-137)/(45-91) 103/67 (09/05 0900) SpO2:  [96 %-100 %] 100 % (09/05 0900) Weight:  [85.7 kg] 85.7 kg (09/04 1904)  Intake/Output from previous day: 09/04 0701 - 09/05 0700 In: 16567.1 [I.V.:331.7; Blood:986; IV Piggyback:3249.5] Out: 11100 [id number] Intake/Output this shift: Total I/O In: 6300 [I.V.:250; Other:6000; IV Piggyback:50] Out: [id number]  Physical Exam:  General: Alert and oriented GU: Urine mostly clear on moderate CBI  Lab Results: Recent Labs    12/22/18 2019 12/23/18 0154 12/23/18 0702  HGB 9.2* 8.5* 8.9*  HCT 27.0* 27.0* 28.1*   BMET Recent Labs    12/22/18 2016 12/22/18 2019 12/23/18 0154  NA 137 138 136  K 4.2 4.3 4.3  CL 107 105 107  CO2 20*  --  17*  GLUCOSE 182* 178* 219*  BUN 16 14 17   CREATININE 1.21 1.10 1.27*  CALCIUM 8.4*  --  8.0*     Studies/Results: Ct Abdomen Pelvis W Contrast  Result Date: 12/22/2018 CLINICAL DATA:  Abdominal distention EXAM: CT ABDOMEN AND PELVIS WITH CONTRAST TECHNIQUE: Multidetector CT imaging of the abdomen and pelvis was performed using the standard protocol following bolus administration of intravenous contrast. CONTRAST:  154mL OMNIPAQUE IOHEXOL 300 MG/ML  SOLN COMPARISON:  12/03/2018 FINDINGS: Lower chest: Coronary artery calcifications.  No acute abnormality. Hepatobiliary: No focal hepatic abnormality. Gallbladder unremarkable. Pancreas: No focal abnormality or ductal dilatation. Spleen: No focal abnormality.  Normal size. Adrenals/Urinary Tract: Mild left hydronephrosis. No hydronephrosis on the right. Adrenal glands  unremarkable. There is a large abnormal filling defect filling much of the urinary bladder. This could reflect mass or blood clot. Foley catheter is present within the bladder. Stomach/Bowel: Normal appendix. Scattered colonic diverticulosis. No active diverticulitis. Stomach and small bowel decompressed, unremarkable. Vascular/Lymphatic: Aortic atherosclerosis. No enlarged abdominal or pelvic lymph nodes. Reproductive: Prostate enlargement. Other: No free fluid or free air. Musculoskeletal: No acute bony abnormality. IMPRESSION: Large irregular filling deflect within the urinary bladder which could reflect large bladder wall mass or blood clot. There is mild left hydroureteronephrosis. This could be further evaluated with cystoscopy. Aortic atherosclerosis.  Coronary artery disease. Colonic diverticulosis. Electronically Signed   By: Rolm Baptise M.D.   On: 12/22/2018 21:47   Dg Chest Portable 1 View  Result Date: 12/22/2018 CLINICAL DATA:  Hypotension EXAM: PORTABLE CHEST 1 VIEW COMPARISON:  November 30, 2018 FINDINGS: The heart size and mediastinal contours are within normal limits. Both lungs are clear. The visualized skeletal structures are unremarkable. IMPRESSION: No active disease. Electronically Signed   By: Constance Holster M.D.   On: 12/22/2018 20:28    Assessment/Plan: 1) Hematuria: Continue to titrate CBI and monitor Hgb.  Hold Eliquis.  Will need outpatient follow up with Dr. Rosana [facility]es at [facility] after hospitalization for cystoscopy.  Will need to keep catheter upon discharge.  Will follow.  Continue B&O suppositories for bladder spasms but will supplement with oxybutynin prn.   LOS: 1 day   Dutch Gray 12/23/2018, 9:49 AM
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
## 2018-12-23 NOTE — Progress Notes (Signed)
Subjective: Patient admitted this morning, see detailed H&P by Dr [facility] 69 year old male with a history of paroxysmal atrial fibrillation, sleep apnea who was discharged 2 weeks ago after admitted for sepsis from intra-abdominal cause at that time patient had possible contained duodenal perforation, also had acute renal failure with hematuria and urine retention at that time.  He was seen by urology and patient has been self cath for past 2 days.  Patient noticed he was having more hematuria last evening became weak and diaphoretic with abdominal pain EMS was called and patient was brought to the ED. In the ED patient was hypotensive with blood pressure systolic in 0000000, tachycardic blood work showing elevated lactate, leukocytosis.  He was given emergent blood transfusion as was 5 g drop in hemoglobin with frank hematuria.  CT scan showed bladder mass versus clot.  Urology was consulted.  Continuous bladder irrigation started per urology.  Vitals:   12/23/18 0900 12/23/18 1000  BP: 103/67 124/77  Pulse: 96 (!) 105  Resp: 18 18  Temp:    SpO2: 100% 99%      A/P Hematuria-retained clot in the bladder versus tumor, continue with bladder irrigation.  Urology following.  B&O suppositories for bladder spasm, will add Pyridium 200 mg p.o. 3 times daily.  Hemoglobin stable at 8.9.  Follow CBC in a.m.    [facility] [facility] [phone]

## 2018-12-24 ENCOUNTER — Inpatient Hospital Stay (HOSPITAL_COMMUNITY): Payer: BC Managed Care – PPO

## 2018-12-24 DIAGNOSIS — R579 Shock, unspecified: Secondary | ICD-10-CM

## 2018-12-24 LAB — URINE CULTURE: Culture: NO GROWTH

## 2018-12-24 LAB — BASIC METABOLIC PANEL
Anion gap: 9 (ref 5–15)
BUN: 13 mg/dL (ref 8–23)
CO2: 24 mmol/L (ref 22–32)
Calcium: 8.1 mg/dL — ABNORMAL LOW (ref 8.9–10.3)
Chloride: 106 mmol/L (ref 98–111)
Creatinine, Ser: 0.99 mg/dL (ref 0.61–1.24)
GFR calc Af Amer: >60 mL/min (ref >60)
GFR calc non Af Amer: >60 mL/min (ref >60)
Glucose, Bld: 112 mg/dL — ABNORMAL HIGH (ref 70–99)
Potassium: 4.2 mmol/L (ref 3.5–5.1)
Sodium: 139 mmol/L (ref 135–145)

## 2018-12-24 LAB — CBC
HCT: 22.8 % — ABNORMAL LOW (ref 39.0–52.0)
Hemoglobin: 7.1 g/dL — ABNORMAL LOW (ref 13.0–17.0)
MCH: 29.1 pg (ref 26.0–34.0)
MCHC: 31.1 g/dL (ref 30.0–36.0)
MCV: 93.4 fL (ref 80.0–100.0)
Platelets: 250 10*3/uL (ref 150–400)
RBC: 2.44 MIL/uL — ABNORMAL LOW (ref 4.22–5.81)
RDW: 15.1 % (ref 11.5–15.5)
WBC: 15.5 10*3/uL — ABNORMAL HIGH (ref 4.0–10.5)
nRBC: 0 % (ref 0.0–0.2)

## 2018-12-24 MED ORDER — AMIODARONE HCL IN DEXTROSE 360-4.14 MG/200ML-% IV SOLN
60.0000 mg/h | INTRAVENOUS | Status: AC
  Administered 2018-12-24 (×2): 60 mg/h via INTRAVENOUS
  Filled 2018-12-24 (×2): qty 200

## 2018-12-24 MED ORDER — METOPROLOL TARTRATE 5 MG/5ML IV SOLN
INTRAVENOUS | Status: AC
  Filled 2018-12-24: qty 5

## 2018-12-24 MED ORDER — SODIUM CHLORIDE 0.9 % IV BOLUS
500.0000 mL | Freq: Once | INTRAVENOUS | Status: AC
  Administered 2018-12-24: 500 mL via INTRAVENOUS

## 2018-12-24 MED ORDER — HYDROMORPHONE HCL 1 MG/ML IJ SOLN
0.5000 mg | INTRAMUSCULAR | Status: DC | PRN
  Administered 2018-12-24 – 2018-12-30 (×12): 1 mg via INTRAVENOUS
  Filled 2018-12-24 (×12): qty 1

## 2018-12-24 MED ORDER — AMIODARONE LOAD VIA INFUSION
150.0000 mg | Freq: Once | INTRAVENOUS | Status: AC
  Administered 2018-12-24: 150 mg via INTRAVENOUS
  Filled 2018-12-24: qty 83.34

## 2018-12-24 MED ORDER — AMIODARONE HCL IN DEXTROSE 360-4.14 MG/200ML-% IV SOLN
30.0000 mg/h | INTRAVENOUS | Status: DC
  Administered 2018-12-24 (×2): 30 mg/h via INTRAVENOUS
  Filled 2018-12-24 (×2): qty 200

## 2018-12-24 MED ORDER — OXYCODONE HCL 5 MG PO TABS
5.0000 mg | ORAL_TABLET | Freq: Four times a day (QID) | ORAL | Status: DC | PRN
  Administered 2018-12-24 – 2019-01-01 (×14): 5 mg via ORAL
  Filled 2018-12-24 (×14): qty 1

## 2018-12-24 MED ORDER — MIRABEGRON ER 25 MG PO TB24
25.0000 mg | ORAL_TABLET | Freq: Every day | ORAL | Status: DC
  Administered 2018-12-24 – 2018-12-31 (×8): 25 mg via ORAL
  Filled 2018-12-24 (×8): qty 1

## 2018-12-24 MED ORDER — METOPROLOL TARTRATE 5 MG/5ML IV SOLN
5.0000 mg | Freq: Once | INTRAVENOUS | Status: AC
  Administered 2018-12-24: 06:00:00 5 mg via INTRAVENOUS

## 2018-12-24 NOTE — Progress Notes (Signed)
Subjective: Philip Jackson has been having bladder spasms that haven't responded to oxybutynin or B&O's.  His foley is draining bloody urine on CBI.  Hgb down to 7.1 from 8.1 yesterday.   He has gone into Afib with RR and amiodarone has been added.   He remains off of Eliquis.  ROS:  Review of Systems  Constitutional: Negative for chills and fever.  Gastrointestinal: Positive for abdominal pain.  Genitourinary: Positive for hematuria.  All other systems reviewed and are negative.   Anti-infectives: Anti-infectives (From admission, onward)   Start     Dose/Rate Route Frequency Ordered Stop   12/23/18 0400  piperacillin-tazobactam (ZOSYN) IVPB 3.375 g     3.375 g 12.5 mL/hr over 240 Minutes Intravenous Every 8 hours 12/23/18 0207     12/22/18 2130  vancomycin (VANCOCIN) IVPB 1000 mg/200 mL premix     1,000 mg 200 mL/hr over 60 Minutes Intravenous  Once 12/22/18 2117 12/22/18 2333   12/22/18 2045  piperacillin-tazobactam (ZOSYN) IVPB 3.375 g     3.375 g 100 mL/hr over 30 Minutes Intravenous  Once 12/22/18 2033 12/22/18 2230      Current Facility-Administered Medications  Medication Dose Route Frequency Provider Last Rate Last Dose  . acetaminophen (TYLENOL) tablet 650 mg  650 mg Oral Q6H PRN Rise Patience, MD       Or  . acetaminophen (TYLENOL) suppository 650 mg  650 mg Rectal Q6H PRN Rise Patience, MD      . amiodarone (NEXTERONE PREMIX) 360-4.14 MG/200ML-% (1.8 mg/mL) IV infusion  60 mg/hr Intravenous Continuous Bodenheimer, Charles A, NP 33.3 mL/hr at 12/24/18 0656 60 mg/hr at 12/24/18 0656   Followed by  . amiodarone (NEXTERONE PREMIX) 360-4.14 MG/200ML-% (1.8 mg/mL) IV infusion  30 mg/hr Intravenous Continuous Bodenheimer, Clenton Pare, NP      . Chlorhexidine Gluconate Cloth 2 % PADS 6 each  6 each Topical Daily Rise Patience, MD      . Derrill Memo ON 12/25/2018] ferrous sulfate tablet 325 mg  325 mg Oral 2 times weekly Rise Patience, MD      . metoprolol  succinate (TOPROL-XL) 24 hr tablet 100 mg  100 mg Oral Daily Rise Patience, MD   100 mg at 12/23/18 0530  . mirabegron ER (MYRBETRIQ) tablet 25 mg  25 mg Oral Daily Irine Seal, MD      . omega-3 acid ethyl esters (LOVAZA) capsule 1 g  1 g Oral BID Rise Patience, MD   1 g at 12/23/18 2118  . ondansetron (ZOFRAN) tablet 4 mg  4 mg Oral Q6H PRN Rise Patience, MD       Or  . ondansetron [facility]) injection 4 mg  4 mg Intravenous Q6H PRN Rise Patience, MD      . opium-belladonna (B&O SUPPRETTES) 16.2-60 MG suppository 1 suppository  1 suppository Rectal Q8H PRN Rise Patience, MD   1 suppository at 12/23/18 0027  . oxybutynin (DITROPAN) tablet 5 mg  5 mg Oral Q8H PRN Raynelle Bring, MD   5 mg at 12/24/18 0328  . oxyCODONE (Oxy IR/ROXICODONE) immediate release tablet 5 mg  5 mg Oral Q6H PRN Oswald Hillock, MD      . pantoprazole (PROTONIX) EC tablet 40 mg  40 mg Oral Daily Rise Patience, MD   40 mg at 12/23/18 [id number]  . piperacillin-tazobactam (ZOSYN) IVPB 3.375 g  3.375 g Intravenous Q8H Dorrene German, RPH 12.5 mL/hr at 12/24/18 0700    .  rosuvastatin (CRESTOR) tablet 20 mg  20 mg Oral QHS Rise Patience, MD   20 mg at 12/23/18 2118  . senna-docusate (Senokot-S) tablet 1 tablet  1 tablet Oral BID Rise Patience, MD   1 tablet at 12/23/18 2129     Objective: Vital signs in last 24 hours: Temp:  [97.9 F (36.6 C)-98.9 F (37.2 C)] 98.4 F (36.9 C) (09/06 0821) Pulse Rate:  [51-164] 147 (09/06 0815) Resp:  [15-29] 29 (09/06 0815) BP: (84-182)/(52-141) 106/84 (09/06 0815) SpO2:  [90 %-100 %] 96 % (09/06 0815)  Intake/Output from previous day: 09/05 0701 - 09/06 0700 In: 33315.4 [P.O.:720; I.V.:2302.5; IV Piggyback:292.9] Out: [id number] Intake/Output this shift: Total I/O In: -  Out: 2000 [Urine:2000]   Physical Exam Vitals signs reviewed.  Constitutional:      Appearance: Normal appearance.  Abdominal:     Palpations:  Abdomen is soft.     Tenderness: There is abdominal tenderness (in suprapubic area with possible fullness. ).  Neurological:     Mental Status: He is alert.     Lab Results:  Recent Labs    12/23/18 1131 12/24/18 0220  WBC 15.2* 15.5*  HGB 8.1* 7.1*  HCT 25.1* 22.8*  PLT 261 250   BMET Recent Labs    12/23/18 0154 12/24/18 0220  NA 136 139  K 4.3 4.2  CL 107 106  CO2 17* 24  GLUCOSE 219* 112*  BUN 17 13  CREATININE 1.27* 0.99  CALCIUM 8.0* 8.1*   PT/INR No results for input(s): LABPROT, INR in the last 72 hours. ABG No results for input(s): PHART, HCO3 in the last 72 hours.  Invalid input(s): PCO2, PO2  Studies/Results: Ct Abdomen Pelvis W Contrast  Result Date: 12/22/2018 CLINICAL DATA:  Abdominal distention EXAM: CT ABDOMEN AND PELVIS WITH CONTRAST TECHNIQUE: Multidetector CT imaging of the abdomen and pelvis was performed using the standard protocol following bolus administration of intravenous contrast. CONTRAST:  164mL OMNIPAQUE IOHEXOL 300 MG/ML  SOLN COMPARISON:  12/03/2018 FINDINGS: Lower chest: Coronary artery calcifications.  No acute abnormality. Hepatobiliary: No focal hepatic abnormality. Gallbladder unremarkable. Pancreas: No focal abnormality or ductal dilatation. Spleen: No focal abnormality.  Normal size. Adrenals/Urinary Tract: Mild left hydronephrosis. No hydronephrosis on the right. Adrenal glands unremarkable. There is a large abnormal filling defect filling much of the urinary bladder. This could reflect mass or blood clot. Foley catheter is present within the bladder. Stomach/Bowel: Normal appendix. Scattered colonic diverticulosis. No active diverticulitis. Stomach and small bowel decompressed, unremarkable. Vascular/Lymphatic: Aortic atherosclerosis. No enlarged abdominal or pelvic lymph nodes. Reproductive: Prostate enlargement. Other: No free fluid or free air. Musculoskeletal: No acute bony abnormality. IMPRESSION: Large irregular filling deflect  within the urinary bladder which could reflect large bladder wall mass or blood clot. There is mild left hydroureteronephrosis. This could be further evaluated with cystoscopy. Aortic atherosclerosis.  Coronary artery disease. Colonic diverticulosis. Electronically Signed   By: Rolm Baptise M.D.   On: 12/22/2018 21:47   Dg Chest Portable 1 View  Result Date: 12/22/2018 CLINICAL DATA:  Hypotension EXAM: PORTABLE CHEST 1 VIEW COMPARISON:  November 30, 2018 FINDINGS: The heart size and mediastinal contours are within normal limits. Both lungs are clear. The visualized skeletal structures are unremarkable. IMPRESSION: No active disease. Electronically Signed   By: Constance Holster M.D.   On: 12/22/2018 20:28     Assessment and Plan: BPH with BOO and gross hematuria with clot retention on Eliquis.  His catheter is draining on CBI but  the urine remains bloody.  I have requested that he be hand irrigated intermittently to assess for further clots.   Off Eliquis at this time.   Bladder spasms that have not responded to oxybutynin or B&O's.  I will add Myrbetriq 25mg .    ABL anemia with Hgb of 7.1 today.        LOS: 2 days    Irine Seal 12/24/2018 [id number] ID: Philip Jackson, male   DOB: [birth year], 68 y.o.   MRN: [id number]

## 2018-12-24 NOTE — Progress Notes (Signed)
around 0550am patient got up to use BSC and patient's heart rate elevated to 180's SVT and Afib, after 5 minutes of resting, the rate did not break, patient is asymptomatic BP 182/141 map 155. NP Bodenheimer notified, 5 mg Metoprolol IV given, will continue to monitor

## 2018-12-24 NOTE — Progress Notes (Signed)
  Amiodarone Drug - Drug Interaction Consult Note  Recommendations:  Amiodarone is metabolized by the cytochrome P450 system and therefore has the potential to cause many drug interactions. Amiodarone has an average plasma half-life of 50 days (range 20 to 100 days).   There is potential for drug interactions to occur several weeks or months after stopping treatment and the onset of drug interactions may be slow after initiating amiodarone.   [x]  Statins: Increased risk of myopathy. Simvastatin- restrict dose to 20mg  daily. Other statins: counsel patients to report any muscle pain or weakness immediately.  []  Anticoagulants: Amiodarone can increase anticoagulant effect. Consider warfarin dose reduction. Patients should be monitored closely and the dose of anticoagulant altered accordingly, remembering that amiodarone levels take several weeks to stabilize.  []  Antiepileptics: Amiodarone can increase plasma concentration of phenytoin, the dose should be reduced. Note that small changes in phenytoin dose can result in large changes in levels. Monitor patient and counsel on signs of toxicity.  [x]  Beta blockers: increased risk of bradycardia, AV block and myocardial depression. Sotalol - avoid concomitant use.  []   Calcium channel blockers (diltiazem and verapamil): increased risk of bradycardia, AV block and myocardial depression.  []   Cyclosporine: Amiodarone increases levels of cyclosporine. Reduced dose of cyclosporine is recommended.  []  Digoxin dose should be halved when amiodarone is started.  []  Diuretics: increased risk of cardiotoxicity if hypokalemia occurs.  []  Oral hypoglycemic agents (glyburide, glipizide, glimepiride): increased risk of hypoglycemia. Patient's glucose levels should be monitored closely when initiating amiodarone therapy.   []  Drugs that prolong the QT interval:  Torsades de pointes risk may be increased with concurrent use - avoid if possible.  Monitor QTc,  also keep magnesium/potassium WNL if concurrent therapy can't be avoided. Marland Kitchen Antibiotics: e.g. fluoroquinolones, erythromycin. . Antiarrhythmics: e.g. quinidine, procainamide, disopyramide, sotalol. . Antipsychotics: e.g. phenothiazines, haloperidol.  . Lithium, tricyclic antidepressants, and methadone. Thank You,   Eudelia Bunch, Pharm.D [phone] 12/24/2018 7:18 AM

## 2018-12-24 NOTE — Progress Notes (Signed)
[facility]  PROGRESS NOTE  Philip Jackson [id number] DOB: 01/17/1951 DOA: 12/22/2018 PCP: Leonides Sake, MD   Brief HPI:   68 year old male with a history of paroxysmal atrial fibrillation, sleep apnea who was discharged 2 weeks ago after admitted for sepsis from intra-abdominal cause at that time he had possible contained duodenal perforation.  Also had acute kidney injury with hematuria and urine retention.  At that time urology saw the patient and he was discharged home.  Patient was doing self cath for past 2 days.  He noticed worsening hematuria became weak and diaphoretic.  EMS was called.  In the ED patient became hypotensive with blood pressure systolic in 0000000, tachycardia showed elevated lactate and leukocytosis.  He was given emergent blood transfusion as there was 5 g drop in hemoglobin with frank hematuria. CT scan showed bladder mass versus clot. Urology was consulted and continuous bladder irrigation started.    Subjective   Patient seen and examined, he went into A. fib last night, started on IV amiodarone infusion.  Patient still has bloody urine, but he was also started on Pyridium which can turn urine color to orange.  Still having intermittent bladder spasms.   Assessment/Plan:     1. Acute hemorrhagic shock-secondary blood loss from hematuria.  Patient received emergency blood transfusion and later 30 cc/kg IV fluids.  Currently shock has resolved.  Patient is stable.  Hemoglobin is stable at 7.1.  2. ?  Sepsis-I doubt patient had sepsis, he was empirically started on IV Zosyn.  He has been afebrile, urine culture is negative.  Blood cultures are negative to date.  Will discontinue IV Zosyn at this time.  Patient symptoms were due to hemorrhagic shock as above.  3. Acute blood loss anemia-hemoglobin 3 weeks ago was 13, he presented with hemoglobin of 8.6.  Status post emergency blood transfusion.  Now getting continuous bladder irrigation with clearing hematuria.   Today hemoglobin is 7.1.  Follow CBC in a.m.  Transfuse for hemoglobin less than 7.  4. Hematuria-patient has retained clot in the bladder versus tumor.  Continue CBI.  Urology following.  Currently BO suppository for bladder spasm, will discontinue Pyridium as it also can turn the urine orange.  Also Pyridium has not helped patient with bladder spasm.  5. Atrial fibrillation with RVR-patient into A. fib with RVR last night, started on IV amiodarone infusion.  He is currently off Eliquis due to bleeding.  Continue metoprolol      CBC: Recent Labs  Lab 12/22/18 2016 12/22/18 2019 12/23/18 0154 12/23/18 0702 12/23/18 1131 12/24/18 0220  WBC 20.7*  --  22.1* 18.7* 15.2* 15.5*  NEUTROABS 17.2*  --   --   --   --   --   HGB 8.6* 9.2* 8.5* 8.9* 8.1* 7.1*  HCT 28.1* 27.0* 27.0* 28.1* 25.1* 22.8*  MCV 95.9  --  91.2 93.0 90.9 93.4  PLT 381  --  337 288 261 [id number]    Basic Metabolic Panel: Recent Labs  Lab 12/22/18 2016 12/22/18 2019 12/23/18 0154 12/24/18 0220  NA 137 138 136 139  K 4.2 4.3 4.3 4.2  CL 107 105 107 106  CO2 20*  --  17* 24  GLUCOSE 182* 178* 219* 112*  BUN 16 14 17 13   CREATININE 1.21 1.10 1.27* 0.99  CALCIUM 8.4*  --  8.0* 8.1*     DVT prophylaxis: SCDs  Code Status: Full code  Family Communication: No family at bedside  Disposition Plan: likely  home when medically ready for discharge     BMI  Estimated body mass index is 27.91 kg/m as calculated from the following:   Height as of this encounter: 5\' 9"  (1.753 m).   Weight as of this encounter: 85.7 kg.  Scheduled medications:  . Chlorhexidine Gluconate Cloth  6 each Topical Daily  . [START ON 12/25/2018] ferrous sulfate  325 mg Oral 2 times weekly  . metoprolol succinate  100 mg Oral Daily  . mirabegron ER  25 mg Oral Daily  . omega-3 acid ethyl esters  1 g Oral BID  . pantoprazole  40 mg Oral Daily  . rosuvastatin  20 mg Oral QHS  . senna-docusate  1 tablet Oral BID     Consultants:  Urology  Procedures:     Antibiotics:   Anti-infectives (From admission, onward)   Start     Dose/Rate Route Frequency Ordered Stop   12/23/18 0400  piperacillin-tazobactam (ZOSYN) IVPB 3.375 g     3.375 g 12.5 mL/hr over 240 Minutes Intravenous Every 8 hours 12/23/18 0207     12/22/18 2130  vancomycin (VANCOCIN) IVPB 1000 mg/200 mL premix     1,000 mg 200 mL/hr over 60 Minutes Intravenous  Once 12/22/18 2117 12/22/18 2333   12/22/18 2045  piperacillin-tazobactam (ZOSYN) IVPB 3.375 g     3.375 g 100 mL/hr over 30 Minutes Intravenous  Once 12/22/18 2033 12/22/18 2230       Objective   Vitals:   12/24/18 1225 12/24/18 1244 12/24/18 1300 12/24/18 1305  BP: 94/75  (!) 74/55 105/69  Pulse: (!) 119     Resp: (!) 23  (!) 21 (!) 23  Temp:  97.8 F (36.6 C)    TempSrc:  Oral    SpO2: 95%     Weight:      Height:        Intake/Output Summary (Last 24 hours) at 12/24/2018 1334 Last data filed at 12/24/2018 1300 Gross per 24 hour  Intake 32933.5 ml  Output 32150 ml  Net 783.5 ml   Filed Weights   12/22/18 1855 12/22/18 1904  Weight: 85.7 kg 85.7 kg     Physical Examination:    General-appears in no acute distress  Heart-S1-S2, regular, no murmur auscultated  Lungs-clear to auscultation bilaterally, no wheezing or crackles auscultated  Abdomen-soft, nontender, no organomegaly  Extremities-no edema in the lower extremities  Neuro-alert, oriented x3, no focal deficit noted     Data Reviewed: I have personally reviewed following labs and imaging studies   Recent Results (from the past 240 hour(s))  Urine culture     Status: None   Collection Time: 12/22/18  8:16 PM   Specimen: Urine, Clean Catch  Result Value Ref Range Status   Specimen Description   Final    URINE, CLEAN CATCH Performed at [facility], [place] [address] [place], [place] [address]    Special Requests   Final    NONE Performed at [facility], [place] [address] [place], [place] [address]    Culture   Final    NO GROWTH Performed at [facility], [place] [address], [place], [place] [address]    Report Status 12/24/2018 FINAL  Final  Blood culture (routine x 2)     Status: None (Preliminary result)   Collection Time: 12/22/18  8:55 PM   Specimen: BLOOD  Result Value Ref Range Status   Specimen Description   Final    BLOOD  BLOOD LEFT ARM Performed at [facility], [place] [address] [place], [place] [address]    Special Requests   Final    BOTTLES DRAWN AEROBIC AND ANAEROBIC Blood Culture adequate volume Performed at [place] [address], [place], [place] [address]    Culture   Final    NO GROWTH 2 DAYS Performed at [place] [address] [place], [place] [address]    Report Status PENDING  Incomplete  Blood culture (routine x 2)     Status: None (Preliminary result)   Collection Time: 12/22/18  8:55 PM   Specimen: BLOOD  Result Value Ref Range Status   Specimen Description   Final    BLOOD LEFT ANTECUBITAL Performed at [place] [address], [place], [place] [address]    Special Requests   Final    BOTTLES DRAWN AEROBIC AND ANAEROBIC Blood Culture results may not be optimal due to an inadequate volume of blood received in culture bottles Performed at [place] [address], [place], [place] [address]    Culture   Final    NO GROWTH 2 DAYS Performed at [place] [address] [place], [place] [address]    Report Status PENDING  Incomplete  SARS Coronavirus 2 [facility] order, Performed in [facility] hospital lab) Nasopharyngeal Nasopharyngeal Swab     Status: None   Collection Time: 12/22/18  8:55 PM   Specimen: Nasopharyngeal Swab  Result Value Ref Range Status   SARS Coronavirus 2 NEGATIVE NEGATIVE Final    Comment: (NOTE) If result is NEGATIVE SARS-CoV-2 target nucleic  acids are NOT DETECTED. The SARS-CoV-2 RNA is generally detectable in upper and lower  respiratory specimens during the acute phase of infection. The lowest  concentration of SARS-CoV-2 viral copies this assay can detect is 250  copies / mL. A negative result does not preclude SARS-CoV-2 infection  and should not be used as the sole basis for treatment or other  patient management decisions.  A negative result may occur with  improper specimen collection / handling, submission of specimen other  than nasopharyngeal swab, presence of viral mutation(s) within the  areas targeted by this assay, and inadequate number of viral copies  (<250 copies / mL). A negative result must be combined with clinical  observations, patient history, and epidemiological information. If result is POSITIVE SARS-CoV-2 target nucleic acids are DETECTED. The SARS-CoV-2 RNA is generally detectable in upper and lower  respiratory specimens dur ing the acute phase of infection.  Positive  results are indicative of active infection with SARS-CoV-2.  Clinical  correlation with patient history and other diagnostic information is  necessary to determine patient infection status.  Positive results do  not rule out bacterial infection or co-infection with other viruses. If result is PRESUMPTIVE POSTIVE SARS-CoV-2 nucleic acids MAY BE PRESENT.   A presumptive positive result was obtained on the submitted specimen  and confirmed on repeat testing.  While 2019 novel coronavirus  (SARS-CoV-2) nucleic acids may be present in the submitted sample  additional confirmatory testing may be necessary for epidemiological  and / or clinical management purposes  to differentiate between  SARS-CoV-2 and other Sarbecovirus currently known to infect humans.  If clinically indicated additional testing with an alternate test  methodology [phone]) is advised. The SARS-CoV-2 RNA is generally  detectable in upper and lower respiratory  sp ecimens during the acute  phase of infection. The expected  result is Negative. Fact Sheet for Patients:  StrictlyIdeas.no Fact Sheet for Healthcare Providers: BankingDealers.co.za This test is not yet approved or cleared by the [place] FDA and has been authorized for detection and/or diagnosis of SARS-CoV-2 by FDA under an Emergency Use Authorization (EUA).  This EUA will remain in effect (meaning this test can be used) for the duration of the COVID-19 declaration under Section 564(b)(1) of the Act, 21 U.S.C. section 360bbb-3(b)(1), unless the authorization is terminated or revoked sooner. Performed at [facility], [place] [address] [place], [place] [address]   MRSA PCR Screening     Status: None   Collection Time: 12/23/18  2:19 AM   Specimen: Nasal Mucosa; Nasopharyngeal  Result Value Ref Range Status   MRSA by PCR NEGATIVE NEGATIVE Final    Comment:        The GeneXpert MRSA Assay (FDA approved for NASAL specimens only), is one component of a comprehensive MRSA colonization surveillance program. It is not intended to diagnose MRSA infection nor to guide or monitor treatment for MRSA infections. Performed at [facility], [place] [address], [place], [place] [address]      Liver Function Tests: Recent Labs  Lab 12/22/18 2016 12/23/18 0154  AST 19 14*  ALT 15 15  ALKPHOS 59 51  BILITOT 0.6 0.8  PROT 6.1* 5.5*  ALBUMIN 2.8* 2.6*   Recent Labs  Lab 12/22/18 2016  LIPASE 55*   No results for input(s): AMMONIA in the last 168 hours.  Cardiac Enzymes: No results for input(s): CKTOTAL, CKMB, CKMBINDEX, TROPONINI in the last 168 hours. BNP (last 3 results) No results for input(s): BNP in the last 8760 hours.  ProBNP (last 3 results) No results for input(s): PROBNP in the last 8760 hours.    Studies: Ct Abdomen Pelvis W Contrast  Result Date: 12/22/2018 CLINICAL DATA:   Abdominal distention EXAM: CT ABDOMEN AND PELVIS WITH CONTRAST TECHNIQUE: Multidetector CT imaging of the abdomen and pelvis was performed using the standard protocol following bolus administration of intravenous contrast. CONTRAST:  157mL OMNIPAQUE IOHEXOL 300 MG/ML  SOLN COMPARISON:  12/03/2018 FINDINGS: Lower chest: Coronary artery calcifications.  No acute abnormality. Hepatobiliary: No focal hepatic abnormality. Gallbladder unremarkable. Pancreas: No focal abnormality or ductal dilatation. Spleen: No focal abnormality.  Normal size. Adrenals/Urinary Tract: Mild left hydronephrosis. No hydronephrosis on the right. Adrenal glands unremarkable. There is a large abnormal filling defect filling much of the urinary bladder. This could reflect mass or blood clot. Foley catheter is present within the bladder. Stomach/Bowel: Normal appendix. Scattered colonic diverticulosis. No active diverticulitis. Stomach and small bowel decompressed, unremarkable. Vascular/Lymphatic: Aortic atherosclerosis. No enlarged abdominal or pelvic lymph nodes. Reproductive: Prostate enlargement. Other: No free fluid or free air. Musculoskeletal: No acute bony abnormality. IMPRESSION: Large irregular filling deflect within the urinary bladder which could reflect large bladder wall mass or blood clot. There is mild left hydroureteronephrosis. This could be further evaluated with cystoscopy. Aortic atherosclerosis.  Coronary artery disease. Colonic diverticulosis. Electronically Signed   By: Rolm Baptise M.D.   On: 12/22/2018 21:47   Dg Chest Portable 1 View  Result Date: 12/22/2018 CLINICAL DATA:  Hypotension EXAM: PORTABLE CHEST 1 VIEW COMPARISON:  November 30, 2018 FINDINGS: The heart size and mediastinal contours are within normal limits. Both lungs are clear. The visualized skeletal structures are unremarkable. IMPRESSION: No active disease. Electronically Signed   By: Constance Holster M.D.   On: 12/22/2018 20:28     Admission  status: Inpatient:  Based on patients clinical presentation and evaluation of above clinical data, I have made determination that patient meets Inpatient criteria at this time.  Time spent: 25 min  [facility] Pager [phone]. If 7PM-7AM, please contact night-coverage at [email or web address], Office  [phone]  password [place]  12/24/2018, 1:34 PM  LOS: 2 days

## 2018-12-25 LAB — CBC
HCT: 21.5 % — ABNORMAL LOW (ref 39.0–52.0)
Hemoglobin: 6.6 g/dL — CL (ref 13.0–17.0)
MCH: 29.5 pg (ref 26.0–34.0)
MCHC: 30.7 g/dL (ref 30.0–36.0)
MCV: 96 fL (ref 80.0–100.0)
Platelets: 280 10*3/uL (ref 150–400)
RBC: 2.24 MIL/uL — ABNORMAL LOW (ref 4.22–5.81)
RDW: 15 % (ref 11.5–15.5)
WBC: 17.5 10*3/uL — ABNORMAL HIGH (ref 4.0–10.5)
nRBC: 0.1 % (ref 0.0–0.2)

## 2018-12-25 LAB — BASIC METABOLIC PANEL
Anion gap: 8 (ref 5–15)
BUN: 9 mg/dL (ref 8–23)
CO2: 24 mmol/L (ref 22–32)
Calcium: 8 mg/dL — ABNORMAL LOW (ref 8.9–10.3)
Chloride: 108 mmol/L (ref 98–111)
Creatinine, Ser: 0.88 mg/dL (ref 0.61–1.24)
GFR calc Af Amer: >60 mL/min (ref >60)
GFR calc non Af Amer: >60 mL/min (ref >60)
Glucose, Bld: 119 mg/dL — ABNORMAL HIGH (ref 70–99)
Potassium: 3.7 mmol/L (ref 3.5–5.1)
Sodium: 140 mmol/L (ref 135–145)

## 2018-12-25 LAB — HEMOGLOBIN AND HEMATOCRIT, BLOOD
HCT: 28.7 % — ABNORMAL LOW (ref 39.0–52.0)
Hemoglobin: 9 g/dL — ABNORMAL LOW (ref 13.0–17.0)

## 2018-12-25 LAB — PREPARE RBC (CROSSMATCH)

## 2018-12-25 MED ORDER — ORAL CARE MOUTH RINSE
15.0000 mL | Freq: Two times a day (BID) | OROMUCOSAL | Status: DC
  Administered 2018-12-25 – 2019-01-01 (×12): 15 mL via OROMUCOSAL

## 2018-12-25 MED ORDER — PIPERACILLIN-TAZOBACTAM 3.375 G IVPB
3.3750 g | Freq: Three times a day (TID) | INTRAVENOUS | Status: DC
  Administered 2018-12-25 – 2018-12-27 (×5): 3.375 g via INTRAVENOUS
  Filled 2018-12-25 (×5): qty 50

## 2018-12-25 MED ORDER — AMIODARONE HCL 200 MG PO TABS
400.0000 mg | ORAL_TABLET | Freq: Two times a day (BID) | ORAL | Status: DC
  Administered 2018-12-25 – 2019-01-01 (×15): 400 mg via ORAL
  Filled 2018-12-25 (×16): qty 2

## 2018-12-25 MED ORDER — SODIUM CHLORIDE 0.9% IV SOLUTION
Freq: Once | INTRAVENOUS | Status: AC
  Administered 2018-12-25: 05:00:00 via INTRAVENOUS

## 2018-12-25 NOTE — Progress Notes (Signed)
Verbal order received from Dr Darrick Meigs for [place] per pharmacy for e-coli in urine culture done at pcp office

## 2018-12-25 NOTE — Progress Notes (Signed)
2 UPRBC ordered on night shift 9/6, one unit given and cbc rechecked. Hbg is 9.0. Verified with Dr Darrick Meigs and he does not want second unit given at this time.

## 2018-12-25 NOTE — Progress Notes (Signed)
[facility]  PROGRESS NOTE  Philip Jackson [id number] DOB: 08/07/1950 DOA: 12/22/2018 PCP: Leonides Sake, MD   Brief HPI:   68 year old male with a history of paroxysmal atrial fibrillation, sleep apnea who was discharged 2 weeks ago after admitted for sepsis from intra-abdominal cause at that time he had possible contained duodenal perforation.  Also had acute kidney injury with hematuria and urine retention.  At that time urology saw the patient and he was discharged home.  Patient was doing self cath for past 2 days.  He noticed worsening hematuria became weak and diaphoretic.  EMS was called.  In the ED patient became hypotensive with blood pressure systolic in 0000000, tachycardia showed elevated lactate and leukocytosis.  He was given emergent blood transfusion as there was 5 g drop in hemoglobin with frank hematuria. CT scan showed bladder mass versus clot. Urology was consulted and continuous bladder irrigation started.    Subjective   Patient seen and examined, continues to have intermittent bladder spasms.   Assessment/Plan:     1. Acute hemorrhagic shock-resolved, secondary blood loss from hematuria.  Patient received emergency blood transfusion and later 30 cc/kg IV fluids.    Patient is stable.  Hemoglobin is stable at 9.0  2. ?  Sepsis-I doubt patient had sepsis, he was empirically started on IV Zosyn.  He has been afebrile, urine culture is negative.  Blood cultures are negative to date.  Zosyn was discontinued.  Patient symptoms were due to hemorrhagic shock as above.  3. Acute blood loss anemia-hemoglobin 3 weeks ago was 13, he presented with hemoglobin of 8.6.  Status post emergency blood transfusion.  Now getting continuous bladder irrigation with clearing hematuria.  Hemoglobin was 6.6 last night, received 2 units PRBC this morning hemoglobin is 9.0.    Transfuse for hemoglobin less than 7.  4. Hematuria-patient has retained clot in the bladder versus tumor.  Continue  CBI.  Urology following.  Currently BO suppository for bladder spasm, will discontinue Pyridium as it also can turn the urine orange.  Also Pyridium has not helped patient with bladder spasm.  5. Atrial fibrillation with RVR-patient into A. fib with RVR last night, started on IV amiodarone infusion.  He is currently off Eliquis due to bleeding.  Continue metoprolol.  Patient is now back in normal sinus rhythm.  We will switch IV amiodarone to p.o.  400 mg p.o. twice daily and taper of next few days.  Will discuss with cardiology regarding maintenance dose.      CBC: Recent Labs  Lab 12/22/18 2016  12/23/18 0154 12/23/18 0702 12/23/18 1131 12/24/18 0220 12/25/18 0201 12/25/18 0917  WBC 20.7*  --  22.1* 18.7* 15.2* 15.5* 17.5*  --   NEUTROABS 17.2*  --   --   --   --   --   --   --   HGB 8.6*   < > 8.5* 8.9* 8.1* 7.1* 6.6* 9.0*  HCT 28.1*   < > 27.0* 28.1* 25.1* 22.8* 21.5* 28.7*  MCV 95.9  --  91.2 93.0 90.9 93.4 96.0  --   PLT 381  --  337 288 261 250 280  --    < > = values in this interval not displayed.    Basic Metabolic Panel: Recent Labs  Lab 12/22/18 2016 12/22/18 2019 12/23/18 0154 12/24/18 0220 12/25/18 0201  NA 137 138 136 139 140  K 4.2 4.3 4.3 4.2 3.7  CL 107 105 107 106 108  CO2 20*  --  17* 24 24  GLUCOSE 182* 178* 219* 112* 119*  BUN 16 14 17 13 9   CREATININE 1.21 1.10 1.27* 0.99 0.88  CALCIUM 8.4*  --  8.0* 8.1* 8.0*     DVT prophylaxis: SCDs  Code Status: Full code  Family Communication: No family at bedside  Disposition Plan: likely home when medically ready for discharge     BMI  Estimated body mass index is 27.91 kg/m as calculated from the following:   Height as of this encounter: 5\' 9"  (1.753 m).   Weight as of this encounter: 85.7 kg.  Scheduled medications:  . amiodarone  400 mg Oral BID  . Chlorhexidine Gluconate Cloth  6 each Topical Daily  . ferrous sulfate  325 mg Oral 2 times weekly  . mouth rinse  15 mL Mouth Rinse BID   . metoprolol succinate  100 mg Oral Daily  . mirabegron ER  25 mg Oral Daily  . omega-3 acid ethyl esters  1 g Oral BID  . pantoprazole  40 mg Oral Daily  . rosuvastatin  20 mg Oral QHS  . senna-docusate  1 tablet Oral BID    Consultants:  Urology  Procedures:     Antibiotics:   Anti-infectives (From admission, onward)   Start     Dose/Rate Route Frequency Ordered Stop   12/23/18 0400  piperacillin-tazobactam (ZOSYN) IVPB 3.375 g  Status:  Discontinued     3.375 g 12.5 mL/hr over 240 Minutes Intravenous Every 8 hours 12/23/18 0207 12/24/18 1446   12/22/18 2130  vancomycin (VANCOCIN) IVPB 1000 mg/200 mL premix     1,000 mg 200 mL/hr over 60 Minutes Intravenous  Once 12/22/18 2117 12/22/18 2333   12/22/18 2045  piperacillin-tazobactam (ZOSYN) IVPB 3.375 g     3.375 g 100 mL/hr over 30 Minutes Intravenous  Once 12/22/18 2033 12/22/18 2230       Objective   Vitals:   12/25/18 1015 12/25/18 1047 12/25/18 1100 12/25/18 1200  BP:      Pulse: 95 82 86 81  Resp: (!) 24 (!) 23 17 (!) 25  Temp:    98.2 F (36.8 C)  TempSrc:    Oral  SpO2: 96% 92% 93% 98%  Weight:      Height:        Intake/Output Summary (Last 24 hours) at 12/25/2018 1312 Last data filed at 12/25/2018 1200 Gross per 24 hour  Intake 67228.77 ml  Output 51201 ml  Net 16027.77 ml   Filed Weights   12/22/18 1855 12/22/18 1904  Weight: 85.7 kg 85.7 kg     Physical Examination:    General-appears in no acute distress  Heart-S1-S2, regular, no murmur auscultated  Lungs-clear to auscultation bilaterally, no wheezing or crackles auscultated  Abdomen-soft, nontender, no organomegaly  Extremities-no edema in the lower extremities  Neuro-alert, oriented x3, no focal deficit noted     Data Reviewed: I have personally reviewed following labs and imaging studies   Recent Results (from the past 240 hour(s))  Urine culture     Status: None   Collection Time: 12/22/18  8:16 PM   Specimen:  Urine, Clean Catch  Result Value Ref Range Status   Specimen Description   Final    URINE, CLEAN CATCH Performed at [facility], [place] [address] [place], [place] [address]    Special Requests   Final    NONE Performed at [facility], [place] [address], [place], [place] [address]    Culture  Final    NO GROWTH Performed at [facility], [place] [address] [place], [place] [address]    Report Status 12/24/2018 FINAL  Final  Blood culture (routine x 2)     Status: None (Preliminary result)   Collection Time: 12/22/18  8:55 PM   Specimen: BLOOD  Result Value Ref Range Status   Specimen Description   Final    BLOOD BLOOD LEFT ARM Performed at [place] [address] [place], [place] [address]    Special Requests   Final    BOTTLES DRAWN AEROBIC AND ANAEROBIC Blood Culture adequate volume Performed at [place] [address], [place], [place] [address]    Culture   Final    NO GROWTH 3 DAYS Performed at [facility], [place] [address] [place], [place] [address]    Report Status PENDING  Incomplete  Blood culture (routine x 2)     Status: None (Preliminary result)   Collection Time: 12/22/18  8:55 PM   Specimen: BLOOD  Result Value Ref Range Status   Specimen Description   Final    BLOOD LEFT ANTECUBITAL Performed at [place] [address] [place], [place] [address]    Special Requests   Final    BOTTLES DRAWN AEROBIC AND ANAEROBIC Blood Culture results may not be optimal due to an inadequate volume of blood received in culture bottles Performed at [place] [address], [place],  [address]    Culture   Final    NO GROWTH 3 DAYS Performed at [facility], [place] [address] [place],  [address]    Report Status PENDING  Incomplete  SARS Coronavirus 2 [facility] order, Performed in [facility] hospital lab)  Nasopharyngeal Nasopharyngeal Swab     Status: None   Collection Time: 12/22/18  8:55 PM   Specimen: Nasopharyngeal Swab  Result Value Ref Range Status   SARS Coronavirus 2 NEGATIVE NEGATIVE Final    Comment: (NOTE) If result is NEGATIVE SARS-CoV-2 target nucleic acids are NOT DETECTED. The SARS-CoV-2 RNA is generally detectable in upper and lower  respiratory specimens during the acute phase of infection. The lowest  concentration of SARS-CoV-2 viral copies this assay can detect is 250  copies / mL. A negative result does not preclude SARS-CoV-2 infection  and should not be used as the sole basis for treatment or other  patient management decisions.  A negative result may occur with  improper specimen collection / handling, submission of specimen other  than nasopharyngeal swab, presence of viral mutation(s) within the  areas targeted by this assay, and inadequate number of viral copies  (<250 copies / mL). A negative result must be combined with clinical  observations, patient history, and epidemiological information. If result is POSITIVE SARS-CoV-2 target nucleic acids are DETECTED. The SARS-CoV-2 RNA is generally detectable in upper and lower  respiratory specimens dur ing the acute phase of infection.  Positive  results are indicative of active infection with SARS-CoV-2.  Clinical  correlation with patient history and other diagnostic information is  necessary to determine patient infection status.  Positive results do  not rule out bacterial infection or co-infection with other viruses. If result is PRESUMPTIVE POSTIVE SARS-CoV-2 nucleic acids MAY BE PRESENT.   A presumptive positive result was obtained on the submitted specimen  and confirmed on repeat testing.  While 2019 novel coronavirus  (SARS-CoV-2) nucleic acids may be present in the  submitted sample  additional confirmatory testing may be necessary for epidemiological  and / or clinical management purposes  to  differentiate between  SARS-CoV-2 and other Sarbecovirus currently known to infect humans.  If clinically indicated additional testing with an alternate test  methodology [phone]) is advised. The SARS-CoV-2 RNA is generally  detectable in upper and lower respiratory sp ecimens during the acute  phase of infection. The expected result is Negative. Fact Sheet for Patients:  StrictlyIdeas.no Fact Sheet for Healthcare Providers: BankingDealers.co.za This test is not yet approved or cleared by the [place] FDA and has been authorized for detection and/or diagnosis of SARS-CoV-2 by FDA under an Emergency Use Authorization (EUA).  This EUA will remain in effect (meaning this test can be used) for the duration of the COVID-19 declaration under Section 564(b)(1) of the Act, 21 U.S.C. section 360bbb-3(b)(1), unless the authorization is terminated or revoked sooner. Performed at [facility], [place] [address] [place], [place] [address]   MRSA PCR Screening     Status: None   Collection Time: 12/23/18  2:19 AM   Specimen: Nasal Mucosa; Nasopharyngeal  Result Value Ref Range Status   MRSA by PCR NEGATIVE NEGATIVE Final    Comment:        The GeneXpert MRSA Assay (FDA approved for NASAL specimens only), is one component of a comprehensive MRSA colonization surveillance program. It is not intended to diagnose MRSA infection nor to guide or monitor treatment for MRSA infections. Performed at [facility], [place] [address] [place], [place] [address]      Liver Function Tests: Recent Labs  Lab 12/22/18 2016 12/23/18 0154  AST 19 14*  ALT 15 15  ALKPHOS 59 51  BILITOT 0.6 0.8  PROT 6.1* 5.5*  ALBUMIN 2.8* 2.6*   Recent Labs  Lab 12/22/18 2016  LIPASE 55*   No results for input(s): AMMONIA in the last 168 hours.  Cardiac Enzymes: No results for input(s): CKTOTAL, CKMB, CKMBINDEX, TROPONINI  in the last 168 hours. BNP (last 3 results) No results for input(s): BNP in the last 8760 hours.  ProBNP (last 3 results) No results for input(s): PROBNP in the last 8760 hours.    Studies: Dg Abd Portable 1v  Result Date: 12/24/2018 CLINICAL DATA:  68 year old male with abdominal distention. EXAM: PORTABLE ABDOMEN - 1 VIEW COMPARISON:  CT of the abdomen pelvis dated 12/22/2018. FINDINGS: Borderline dilated air-filled loops of small bowel in the upper abdomen may represent mild ileus. Clinical correlation is recommended. Stool noted throughout the colon. No free air identified. There is degenerative changes of the spine. Rounded density over the pelvis most consistent with blood clot full urinary bladder seen on the recent CT. IMPRESSION: 1. Borderline dilated air-filled loops of small bowel in the upper abdomen may represent mild ileus. 2. Density over the pelvis in keeping with blood filled urinary bladder. Electronically Signed   By: Anner Crete M.D.   On: 12/24/2018 23:27     Admission status: Inpatient: Based on patients clinical presentation and evaluation of above clinical data, I have made determination that patient meets Inpatient criteria at this time.  Time spent: 25 min  [facility] Pager [phone]. If 7PM-7AM, please contact night-coverage at [email or web address], Office  [phone]  password [email or web address]  12/25/2018, 1:12 PM  LOS: 3 days

## 2018-12-25 NOTE — Progress Notes (Signed)
Pt's PCP called pt today on his cell phone and let him know that his urine culture that was done in the office before hospital admission has grown e-coli. I notified Dr Darrick Meigs through secure chat and a page.

## 2018-12-25 NOTE — Progress Notes (Addendum)
CRITICAL VALUE ALERT  Critical Value:  Hemoglobin 6.6  Date & Time Notied:  12/25/2018   Provider Notified: Dr. Silas [place]  Orders Received/Actions taken: Transfuse 2 units RBC's

## 2018-12-25 NOTE — Progress Notes (Addendum)
Pharmacy Antibiotic Note  Philip Jackson is a 68 y.o. male admitted on 12/22/2018 with UTI.  Pharmacy has been consulted for piperacillin/tazobactam dosing based on outpatient urine culture.   Today, 12/25/18  WBC 17.5  SCr 0.88 - WNL, stable. CrCl ~ 85 mL/min  Afebrile  Plan:  Piperacillin/tazobactam 3.375 g IV q8h EI  Height: 5\' 9"  (175.3 cm) Weight: 189 lb (85.7 kg) IBW/kg (Calculated) : 70.7  Temp (24hrs), Avg:98.1 F (36.7 C), Min:97.4 F (36.3 C), Max:98.6 F (37 C)  Recent Labs  Lab 12/22/18 2016 12/22/18 2019 12/23/18 0133 12/23/18 0154 12/23/18 0702 12/23/18 1131 12/24/18 0220 12/25/18 0201  WBC 20.7*  --   --  22.1* 18.7* 15.2* 15.5* 17.5*  CREATININE 1.21 1.10  --  1.27*  --   --  0.99 0.88  LATICACIDVEN 4.7*  --  3.6*  --  2.4*  --   --   --     Estimated Creatinine Clearance: 87.2 mL/min (by C-G formula based on SCr of 0.88 mg/dL).    Allergies  Allergen Reactions  . 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 Nsaids     Duodenal ulcer.  anticoagulated    Antimicrobials this admission: Piperacillin/tazobactam 9/4 >> 9/6 then resumed 9/7 >>   Vancomycin once on 9/4  Dose adjustments this admission:  Microbiology results: 9/4 BCx: no growth 3 days 9/4 UCx: ngf  9/4 MRSA PCR: Negative 9/4 SARS-2: Negative  Thank you for allowing pharmacy to be a part of this patient's care.  Lenis Noon, PharmD 12/25/2018 5:56 PM

## 2018-12-26 ENCOUNTER — Inpatient Hospital Stay (HOSPITAL_COMMUNITY): Payer: BC Managed Care – PPO

## 2018-12-26 ENCOUNTER — Ambulatory Visit: Payer: BC Managed Care – PPO | Admitting: Physical Therapy

## 2018-12-26 DIAGNOSIS — I480 Paroxysmal atrial fibrillation: Secondary | ICD-10-CM

## 2018-12-26 LAB — BPAM RBC
Blood Product Expiration Date: 202009192359
Blood Product Expiration Date: 202009232359
Blood Product Expiration Date: 202010082359
Blood Product Expiration Date: 202010082359
ISSUE DATE / TIME: 202009041952
ISSUE DATE / TIME: 202009050138
ISSUE DATE / TIME: 202009050143
ISSUE DATE / TIME: 202009070429
Unit Type and Rh: 5100
Unit Type and Rh: 5100
Unit Type and Rh: 9500
Unit Type and Rh: 9500

## 2018-12-26 LAB — TYPE AND SCREEN
ABO/RH(D): O POS
Antibody Screen: NEGATIVE
Unit division: 0
Unit division: 0
Unit division: 0
Unit division: 0

## 2018-12-26 LAB — ECHOCARDIOGRAM COMPLETE
Height: 69 in
Weight: 3024 oz

## 2018-12-26 LAB — CBC
HCT: 24.1 % — ABNORMAL LOW (ref 39.0–52.0)
Hemoglobin: 7.4 g/dL — ABNORMAL LOW (ref 13.0–17.0)
MCH: 29.2 pg (ref 26.0–34.0)
MCHC: 30.7 g/dL (ref 30.0–36.0)
MCV: 95.3 fL (ref 80.0–100.0)
Platelets: 252 10*3/uL (ref 150–400)
RBC: 2.53 MIL/uL — ABNORMAL LOW (ref 4.22–5.81)
RDW: 14.6 % (ref 11.5–15.5)
WBC: 14 10*3/uL — ABNORMAL HIGH (ref 4.0–10.5)
nRBC: 0.3 % — ABNORMAL HIGH (ref 0.0–0.2)

## 2018-12-26 LAB — GLUCOSE, CAPILLARY: Glucose-Capillary: 120 mg/dL — ABNORMAL HIGH (ref 70–99)

## 2018-12-26 MED ORDER — FINASTERIDE 5 MG PO TABS
5.0000 mg | ORAL_TABLET | Freq: Every day | ORAL | Status: DC
  Administered 2018-12-26 – 2019-01-01 (×7): 5 mg via ORAL
  Filled 2018-12-26 (×7): qty 1

## 2018-12-26 NOTE — Progress Notes (Addendum)
[facility]  PROGRESS NOTE  Philip Jackson [id number] DOB: 19-May-1950 DOA: 12/22/2018 PCP: Leonides Sake, MD   Brief HPI:   68 year old male with a history of paroxysmal atrial fibrillation, sleep apnea who was discharged 2 weeks ago after admitted for sepsis from intra-abdominal cause at that time he had possible contained duodenal perforation.  Also had acute kidney injury with hematuria and urine retention.  At that time urology saw the patient and he was discharged home.  Patient was doing self cath for past 2 days.  He noticed worsening hematuria became weak and diaphoretic.  EMS was called.  In the ED patient became hypotensive with blood pressure systolic in 0000000, tachycardia showed elevated lactate and leukocytosis.  He was given emergent blood transfusion as there was 5 g drop in hemoglobin with frank hematuria. CT scan showed bladder mass versus clot. Urology was consulted and continuous bladder irrigation started.    Subjective   Patient seen and examined, continues to have hematuria despite CBI.  He is off Lasix.  IV amiodarone was changed to p.o. 400 mg p.o. twice daily yesterday.  Cardiology has been consulted for further recommendations.   Assessment/Plan:     1. Acute hemorrhagic shock-resolved, secondary blood loss from hematuria.  Patient received emergency blood transfusion and later 30 cc/kg IV fluids.    Patient is stable.  Hemoglobin is stable at 9.0  2. UTI-patient's PCP called yesterday and told that patient urine culture growing E. coli.  Patient started on IV Zosyn again.  Tried calling patient's clinic at [facility].  Unable to contact, left a message.  3. Acute blood loss anemia-hemoglobin 3 weeks ago was 13, he presented with hemoglobin of 8.6.  Status post emergency blood transfusion.  Now getting continuous bladder irrigation with clearing hematuria.  Patient receiving blood transfusions for hemoglobin less than 7.0.  Continue to follow  CBC.  4. Hematuria-patient has retained clot in the bladder versus tumor.  Continue CBI.  Urology following.  Currently BO suppository for bladder spasm, will discontinue Pyridium as it also can turn the urine orange.  Also Pyridium has not helped patient with bladder spasm.  5. Atrial fibrillation with RVR-patient into A. fib with RVR last night, started on IV amiodarone infusion.  He is currently off Eliquis due to bleeding.  Continue metoprolol.  Patient is now back in normal sinus rhythm.  We will switch IV amiodarone to p.o.  400 mg p.o. twice daily and taper of next few days.  Cardiology was consulted, recommend to continue with current dose and changed to amiodarone 200 mg p.o. twice daily at time of discharge.  Patient to follow-up with cardiology as outpatient.  He will remain off Eliquis.  Cardiology recommends to resume Eliquis once because of hematuria ascertained and cleared by urology.     CBC: Recent Labs  Lab 12/22/18 2016  12/23/18 0702 12/23/18 1131 12/24/18 0220 12/25/18 0201 12/25/18 0917 12/26/18 0220  WBC 20.7*   < > 18.7* 15.2* 15.5* 17.5*  --  14.0*  NEUTROABS 17.2*  --   --   --   --   --   --   --   HGB 8.6*   < > 8.9* 8.1* 7.1* 6.6* 9.0* 7.4*  HCT 28.1*   < > 28.1* 25.1* 22.8* 21.5* 28.7* 24.1*  MCV 95.9   < > 93.0 90.9 93.4 96.0  --  95.3  PLT 381   < > 288 261 250 280  --  252   < > =  values in this interval not displayed.    Basic Metabolic Panel: Recent Labs  Lab 12/22/18 2016 12/22/18 2019 12/23/18 0154 12/24/18 0220 12/25/18 0201  NA 137 138 136 139 140  K 4.2 4.3 4.3 4.2 3.7  CL 107 105 107 106 108  CO2 20*  --  17* 24 24  GLUCOSE 182* 178* 219* 112* 119*  BUN 16 14 17 13 9   CREATININE 1.21 1.10 1.27* 0.99 0.88  CALCIUM 8.4*  --  8.0* 8.1* 8.0*     DVT prophylaxis: SCDs  Code Status: Full code  Family Communication: No family at bedside  Disposition Plan: likely home when medically ready for discharge     BMI  Estimated body  mass index is 27.91 kg/m as calculated from the following:   Height as of this encounter: 5\' 9"  (1.753 m).   Weight as of this encounter: 85.7 kg.  Scheduled medications:  . amiodarone  400 mg Oral BID  . Chlorhexidine Gluconate Cloth  6 each Topical Daily  . ferrous sulfate  325 mg Oral 2 times weekly  . finasteride  5 mg Oral Daily  . mouth rinse  15 mL Mouth Rinse BID  . metoprolol succinate  100 mg Oral Daily  . mirabegron ER  25 mg Oral Daily  . omega-3 acid ethyl esters  1 g Oral BID  . pantoprazole  40 mg Oral Daily  . rosuvastatin  20 mg Oral QHS  . senna-docusate  1 tablet Oral BID    Consultants:  Urology  Procedures:     Antibiotics:   Anti-infectives (From admission, onward)   Start     Dose/Rate Route Frequency Ordered Stop   12/25/18 1830  piperacillin-tazobactam (ZOSYN) IVPB 3.375 g     3.375 g 12.5 mL/hr over 240 Minutes Intravenous Every 8 hours 12/25/18 1755     12/23/18 0400  piperacillin-tazobactam (ZOSYN) IVPB 3.375 g  Status:  Discontinued     3.375 g 12.5 mL/hr over 240 Minutes Intravenous Every 8 hours 12/23/18 0207 12/24/18 1446   12/22/18 2130  vancomycin (VANCOCIN) IVPB 1000 mg/200 mL premix     1,000 mg 200 mL/hr over 60 Minutes Intravenous  Once 12/22/18 2117 12/22/18 2333   12/22/18 2045  piperacillin-tazobactam (ZOSYN) IVPB 3.375 g     3.375 g 100 mL/hr over 30 Minutes Intravenous  Once 12/22/18 2033 12/22/18 2230       Objective   Vitals:   12/26/18 0705 12/26/18 0800 12/26/18 1200 12/26/18 1230  BP:  138/78    Pulse:  84  88  Resp:  (!) 22  20  Temp: 98 F (36.7 C)  98.1 F (36.7 C)   TempSrc: Oral  Oral   SpO2:  92%  93%  Weight:      Height:        Intake/Output Summary (Last 24 hours) at 12/26/2018 1433 Last data filed at 12/26/2018 1200 Gross per 24 hour  Intake 28106.61 ml  Output 34625 ml  Net -6518.39 ml   Filed Weights   12/22/18 1855 12/22/18 1904  Weight: 85.7 kg 85.7 kg     Physical  Examination:   General-appears in no acute distress Heart-S1-S2, regular no murmur auscultated Lungs-clear to auscultation bilaterally, no wheezing or crackles auscultated Abdomen-soft, nontender, no organomegaly Extremities-no edema in the lower extremities Neuro-alert, oriented x3, no focal deficit noted    Data Reviewed: I have personally reviewed following labs and imaging studies   Recent Results (from the past 240 hour(s))  Urine culture     Status: None   Collection Time: 12/22/18  8:16 PM   Specimen: Urine, Clean Catch  Result Value Ref Range Status   Specimen Description   Final    URINE, CLEAN CATCH Performed at [facility], [place] [address] [place], [place] [address]    Special Requests   Final    NONE Performed at [facility], [place] [address], [place], [place] [address]    Culture   Final    NO GROWTH Performed at [facility], [place] [address] [place], [place] [address]    Report Status 12/24/2018 FINAL  Final  Blood culture (routine x 2)     Status: None (Preliminary result)   Collection Time: 12/22/18  8:55 PM   Specimen: BLOOD  Result Value Ref Range Status   Specimen Description   Final    BLOOD BLOOD LEFT ARM Performed at [place] [address], [place], [place] [address]    Special Requests   Final    BOTTLES DRAWN AEROBIC AND ANAEROBIC Blood Culture adequate volume Performed at [place] [address], [place], [place] [address]    Culture   Final    NO GROWTH 4 DAYS Performed at [facility], [place] [address], [place], [place] [address]    Report Status PENDING  Incomplete  Blood culture (routine x 2)     Status: None (Preliminary result)   Collection Time: 12/22/18  8:55 PM   Specimen: BLOOD  Result Value Ref Range Status   Specimen Description   Final    BLOOD LEFT ANTECUBITAL Performed at [place] [address],  [place], [place] [address]    Special Requests   Final    BOTTLES DRAWN AEROBIC AND ANAEROBIC Blood Culture results may not be optimal due to an inadequate volume of blood received in culture bottles Performed at [place] [address], [place], [place] [address]    Culture   Final    NO GROWTH 4 DAYS Performed at [facility], [place] [address] [place],  [address]    Report Status PENDING  Incomplete  SARS Coronavirus 2 [place] order, Performed in [facility] hospital lab) Nasopharyngeal Nasopharyngeal Swab     Status: None   Collection Time: 12/22/18  8:55 PM   Specimen: Nasopharyngeal Swab  Result Value Ref Range Status   SARS Coronavirus 2 NEGATIVE NEGATIVE Final    Comment: (NOTE) If result is NEGATIVE SARS-CoV-2 target nucleic acids are NOT DETECTED. The SARS-CoV-2 RNA is generally detectable in upper and lower  respiratory specimens during the acute phase of infection. The lowest  concentration of SARS-CoV-2 viral copies this assay can detect is 250  copies / mL. A negative result does not preclude SARS-CoV-2 infection  and should not be used as the sole basis for treatment or other  patient management decisions.  A negative result may occur with  improper specimen collection / handling, submission of specimen other  than nasopharyngeal swab, presence of viral mutation(s) within the  areas targeted by this assay, and inadequate number of viral copies  (<250 copies / mL). A negative result must be combined with clinical  observations, patient history, and epidemiological information. If result is POSITIVE SARS-CoV-2 target nucleic acids are DETECTED. The SARS-CoV-2 RNA is generally detectable in upper and lower  respiratory specimens dur ing the acute phase of infection.  Positive  results are indicative of active infection with SARS-CoV-2.  Clinical  correlation with patient history and other diagnostic information is  necessary to determine  patient infection status.  Positive results do  not rule out bacterial infection or co-infection with other viruses. If result is PRESUMPTIVE POSTIVE SARS-CoV-2 nucleic acids MAY BE PRESENT.   A presumptive positive result was obtained on the submitted specimen  and confirmed on repeat testing.  While 2019 novel coronavirus  (SARS-CoV-2) nucleic acids may be present in the submitted sample  additional confirmatory testing may be necessary for epidemiological  and / or clinical management purposes  to differentiate between  SARS-CoV-2 and other Sarbecovirus currently known to infect humans.  If clinically indicated additional testing with an alternate test  methodology [phone]) is advised. The SARS-CoV-2 RNA is generally  detectable in upper and lower respiratory sp ecimens during the acute  phase of infection. The expected result is Negative. Fact Sheet for Patients:  StrictlyIdeas.no Fact Sheet for Healthcare Providers: BankingDealers.co.za This test is not yet approved or cleared by the [place] FDA and has been authorized for detection and/or diagnosis of SARS-CoV-2 by FDA under an Emergency Use Authorization (EUA).  This EUA will remain in effect (meaning this test can be used) for the duration of the COVID-19 declaration under Section 564(b)(1) of the Act, 21 U.S.C. section 360bbb-3(b)(1), unless the authorization is terminated or revoked sooner. Performed at [facility], [place] [address] [place], [place] [address]   MRSA PCR Screening     Status: None   Collection Time: 12/23/18  2:19 AM   Specimen: Nasal Mucosa; Nasopharyngeal  Result Value Ref Range Status   MRSA by PCR NEGATIVE NEGATIVE Final    Comment:        The GeneXpert MRSA Assay (FDA approved for NASAL specimens only), is one component of a comprehensive MRSA colonization surveillance program. It is not intended to diagnose MRSA infection  nor to guide or monitor treatment for MRSA infections. Performed at [facility], [place] [address], [place], [place] [address]      Liver Function Tests: Recent Labs  Lab 12/22/18 2016 12/23/18 0154  AST 19 14*  ALT 15 15  ALKPHOS 59 51  BILITOT 0.6 0.8  PROT 6.1* 5.5*  ALBUMIN 2.8* 2.6*   Recent Labs  Lab 12/22/18 2016  LIPASE 55*      Studies: Dg Abd Portable 1v  Result Date: 12/24/2018 CLINICAL DATA:  68 year old male with abdominal distention. EXAM: PORTABLE ABDOMEN - 1 VIEW COMPARISON:  CT of the abdomen pelvis dated 12/22/2018. FINDINGS: Borderline dilated air-filled loops of small bowel in the upper abdomen may represent mild ileus. Clinical correlation is recommended. Stool noted throughout the colon. No free air identified. There is degenerative changes of the spine. Rounded density over the pelvis most consistent with blood clot full urinary bladder seen on the recent CT. IMPRESSION: 1. Borderline dilated air-filled loops of small bowel in the upper abdomen may represent mild ileus. 2. Density over the pelvis in keeping with blood filled urinary bladder. Electronically Signed   By: Anner Crete M.D.   On: 12/24/2018 23:27     Admission status: Inpatient: Based on patients clinical presentation and evaluation of above clinical data, I have made determination that patient meets Inpatient criteria at this time.  Time spent: 25 min  [facility] Pager [phone]. If 7PM-7AM, please contact night-coverage at [email or web address], Office  [phone]  password [facility]  12/26/2018, 2:33  PM  LOS: 4 days

## 2018-12-26 NOTE — Progress Notes (Signed)
  Echocardiogram 2D Echocardiogram has been performed.  Philip Jackson 12/26/2018, 3:34 PM
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
## 2018-12-26 NOTE — Plan of Care (Signed)
Pt was updated on current status and POC. Dr. Clint Lipps at bedside.

## 2018-12-26 NOTE — Progress Notes (Signed)
Subjective/Chief Complaint:  1 - Acute Renal Failure / Urinary Retention / Bilateral Hydronephrosis - Baseline Cr <1.2 with rise to 3s on admit for abdominal pain, malaise, ileus few weeks after total knee replacement. CT 8/13 on intake with very large prostate and symmetric bilateral hydro to bladder. Also on ACEI. After foley and hydration Cr back to <1.2 and resolved hydro by repeat imaging 8/16. GFR back to baseline with bladder drainage. Placed on CIC by Rosana Hoes at [facility] 11/2018 and sig heamturia with this as he was on blood thinners.   2 - Very Large Prostate - 2mL prostate with huge median lobe by CT 11/2018. He is on oxybutynin at baseline.   3 - Prostate Screening / Elevated PSA - H/o elevated PSA and negative BX by Rosana Hoes in past per report.  4 - Gross Hematuria / Clot Rention / Anemia -admitted 12/2018 with massive clot retention / anemia after being on attempted self-cath. HE is on blood thinners for AFib w/o CVA/clots.   PMH sig for obesity, total knee, AFib/Eliquus (follows [facility]). His PCP is Dr. Ivonne Andrew who is company MD at [employer] where he works.   Today "Philip Jackson" is stable. Hematuria clearing some on irrigation and holding blood thinners.    Objective: Vital signs in last 24 hours: Temp:  [97.8 F (36.6 C)-98.7 F (37.1 C)] 98 F (36.7 C) (09/08 0705) Pulse Rate:  [74-106] 74 (09/08 0400) Resp:  [13-32] 15 (09/08 0400) BP: (90-148)/(57-78) 100/57 (09/08 0400) SpO2:  [91 %-100 %] 91 % (09/08 0400) Last BM Date: 12/25/18  Intake/Output from previous day: 09/07 0701 - 09/08 0700 In: 34733.9 [P.O.:960; I.V.:124; IV Piggyback:49.9] Out: 46050 [Urine:46050] Intake/Output this shift: No intake/output data recorded.  General appearance: alert, cooperative and appears stated age Eyes: negative Nose: Nares normal. Septum midline. Mucosa normal. No drainage or sinus tenderness. Throat: lips, mucosa, and tongue normal; teeth and gums normal Neck: supple,  symmetrical, trachea midline Back: symmetric, no curvature. ROM normal. No CVA tenderness. Resp: non-labored on minimal NCO2 Cardio: Reg rate by monitor in 70s.  GI: soft, non-tender; bowel sounds normal; no masses,  no organomegaly Male genitalia: normal, 3 way foley in place. I put this on gentle tension to help with hematuria.  Extremities: extremities normal, atraumatic, no cyanosis or edema Skin: Skin color, texture, turgor normal. No rashes or lesions Lymph nodes: Cervical, supraclavicular, and axillary nodes normal. Neurologic: Grossly normal  Lab Results:  Recent Labs    12/25/18 0201 12/25/18 0917 12/26/18 0220  WBC 17.5*  --  14.0*  HGB 6.6* 9.0* 7.4*  HCT 21.5* 28.7* 24.1*  PLT 280  --  252   BMET Recent Labs    12/24/18 0220 12/25/18 0201  NA 139 140  K 4.2 3.7  CL 106 108  CO2 24 24  GLUCOSE 112* 119*  BUN 13 9  CREATININE 0.99 0.88  CALCIUM 8.1* 8.0*   PT/INR No results for input(s): LABPROT, INR in the last 72 hours. ABG No results for input(s): PHART, HCO3 in the last 72 hours.  Invalid input(s): PCO2, PO2  Studies/Results: Dg Abd Portable 1v  Result Date: 12/24/2018 CLINICAL DATA:  68 year old male with abdominal distention. EXAM: PORTABLE ABDOMEN - 1 VIEW COMPARISON:  CT of the abdomen pelvis dated 12/22/2018. FINDINGS: Borderline dilated air-filled loops of small bowel in the upper abdomen may represent mild ileus. Clinical correlation is recommended. Stool noted throughout the colon. No free air identified. There is degenerative changes of the spine. Rounded  density over the pelvis most consistent with blood clot full urinary bladder seen on the recent CT. IMPRESSION: 1. Borderline dilated air-filled loops of small bowel in the upper abdomen may represent mild ileus. 2. Density over the pelvis in keeping with blood filled urinary bladder. Electronically Signed   By: Anner Crete M.D.   On: 12/24/2018 23:27    Anti-infectives: Anti-infectives  (From admission, onward)   Start     Dose/Rate Route Frequency Ordered Stop   12/25/18 1830  piperacillin-tazobactam (ZOSYN) IVPB 3.375 g     3.375 g 12.5 mL/hr over 240 Minutes Intravenous Every 8 hours 12/25/18 1755     12/23/18 0400  piperacillin-tazobactam (ZOSYN) IVPB 3.375 g  Status:  Discontinued     3.375 g 12.5 mL/hr over 240 Minutes Intravenous Every 8 hours 12/23/18 0207 12/24/18 1446   12/22/18 2130  vancomycin (VANCOCIN) IVPB 1000 mg/200 mL premix     1,000 mg 200 mL/hr over 60 Minutes Intravenous  Once 12/22/18 2117 12/22/18 2333   12/22/18 2045  piperacillin-tazobactam (ZOSYN) IVPB 3.375 g     3.375 g 100 mL/hr over 30 Minutes Intravenous  Once 12/22/18 2033 12/22/18 2230      Assessment/Plan: s/p * No surgery found *  Continue current foley on irrigation, It will take days for his hematuria, which is likely form massive BPH, to stop. HOLD BLOOD THINNERS INDEFINITELY in setting of this sort of blood loss, especially as they are for prevention indication only. Begin daily finasteride as this is best medical therapy for prostate source bleeding.   Agree with PRN transfusion until Hgb stabilizes.   He would likely be best served long term with elective simple prostatectomy if it can be proven that he has preserved bladder contractility.   Alexis Frock 12/26/2018

## 2018-12-27 ENCOUNTER — Ambulatory Visit: Payer: BC Managed Care – PPO | Admitting: Physical Therapy

## 2018-12-27 DIAGNOSIS — D62 Acute posthemorrhagic anemia: Secondary | ICD-10-CM

## 2018-12-27 DIAGNOSIS — M25661 Stiffness of right knee, not elsewhere classified: Secondary | ICD-10-CM | POA: Diagnosis not present

## 2018-12-27 DIAGNOSIS — M25561 Pain in right knee: Secondary | ICD-10-CM | POA: Diagnosis not present

## 2018-12-27 DIAGNOSIS — R600 Localized edema: Secondary | ICD-10-CM | POA: Diagnosis not present

## 2018-12-27 LAB — BASIC METABOLIC PANEL
Anion gap: 8 (ref 5–15)
BUN: 8 mg/dL (ref 8–23)
CO2: 26 mmol/L (ref 22–32)
Calcium: 8.2 mg/dL — ABNORMAL LOW (ref 8.9–10.3)
Chloride: 106 mmol/L (ref 98–111)
Creatinine, Ser: 0.82 mg/dL (ref 0.61–1.24)
GFR calc Af Amer: >60 mL/min (ref >60)
GFR calc non Af Amer: >60 mL/min (ref >60)
Glucose, Bld: 113 mg/dL — ABNORMAL HIGH (ref 70–99)
Potassium: 3.8 mmol/L (ref 3.5–5.1)
Sodium: 140 mmol/L (ref 135–145)

## 2018-12-27 LAB — CBC
HCT: 24.7 % — ABNORMAL LOW (ref 39.0–52.0)
Hemoglobin: 7.7 g/dL — ABNORMAL LOW (ref 13.0–17.0)
MCH: 29.6 pg (ref 26.0–34.0)
MCHC: 31.2 g/dL (ref 30.0–36.0)
MCV: 95 fL (ref 80.0–100.0)
Platelets: 279 10*3/uL (ref 150–400)
RBC: 2.6 MIL/uL — ABNORMAL LOW (ref 4.22–5.81)
RDW: 15.3 % (ref 11.5–15.5)
WBC: 13.7 10*3/uL — ABNORMAL HIGH (ref 4.0–10.5)
nRBC: 0 % (ref 0.0–0.2)

## 2018-12-27 LAB — CULTURE, BLOOD (ROUTINE X 2)
Culture: NO GROWTH
Culture: NO GROWTH
Special Requests: ADEQUATE

## 2018-12-27 MED ORDER — POLYETHYLENE GLYCOL 3350 17 G PO PACK: 17.0000 g | PACK | Freq: Every day | ORAL | Status: DC | PRN

## 2018-12-27 MED ORDER — SENNOSIDES-DOCUSATE SODIUM 8.6-50 MG PO TABS
2.0000 | ORAL_TABLET | Freq: Every evening | ORAL | Status: DC | PRN
  Administered 2018-12-27: 2 via ORAL

## 2018-12-27 MED ORDER — CEPHALEXIN 500 MG PO CAPS
500.0000 mg | ORAL_CAPSULE | Freq: Three times a day (TID) | ORAL | Status: DC
  Administered 2018-12-27 – 2019-01-01 (×17): 500 mg via ORAL
  Filled 2018-12-27 (×20): qty 1

## 2018-12-27 NOTE — Progress Notes (Signed)
Pharmacy - ANtimicrobial Stewardship  Called Dr Hamrick's office and asked for Urine culture and susceptibility results be faxed to [facility] ICU[facility].  E. Coli growing per report is pan-sensitive.  Report to be placed in patient's shadow chart.  Doreene Eland, PharmD, BCPS.   Work Cell: [phone] 12/27/2018 11:16 AM

## 2018-12-27 NOTE — Progress Notes (Addendum)
Progress Note  Patient Name: Philip Jackson Date of Encounter: 12/27/2018  Primary Cardiologist: Belva Crome III, MD   Subjective     No chest pain or dyspnea.   Inpatient Medications    Scheduled Meds: . amiodarone  400 mg Oral BID  . Chlorhexidine Gluconate Cloth  6 each Topical Daily  . ferrous sulfate  325 mg Oral 2 times weekly  . finasteride  5 mg Oral Daily  . mouth rinse  15 mL Mouth Rinse BID  . metoprolol succinate  100 mg Oral Daily  . mirabegron ER  25 mg Oral Daily  . omega-3 acid ethyl esters  1 g Oral BID  . pantoprazole  40 mg Oral Daily  . rosuvastatin  20 mg Oral QHS  . senna-docusate  1 tablet Oral BID   Continuous Infusions: . piperacillin-tazobactam (ZOSYN)  IV 3.375 g (12/27/18 0625)   PRN Meds: acetaminophen **OR** acetaminophen, HYDROmorphone (DILAUDID) injection, ondansetron **OR** ondansetron (ZOFRAN) IV, opium-belladonna, oxybutynin, oxyCODONE, polyethylene glycol, senna-docusate   Vital Signs    Vitals:   12/27/18 0000 12/27/18 0400 12/27/18 0432 12/27/18 0800  BP: 115/64 123/70    Pulse: 66 71    Resp: 15 18    Temp: 97.9 F (36.6 C)  98.2 F (36.8 C) 98 F (36.7 C)  TempSrc: Oral  Oral Oral  SpO2: 92% 94%    Weight:      Height:        Intake/Output Summary (Last 24 hours) at 12/27/2018 0917 Last data filed at 12/27/2018 0729 Gross per 24 hour  Intake 9113.29 ml  Output 26825 ml  Net -17711.71 ml   Last 3 Weights 12/22/2018 12/22/2018 12/19/2018  Weight (lbs) 189 lb 189 lb 189 lb 12.8 oz  Weight (kg) 85.73 kg 85.73 kg 86.093 kg      Telemetry    SR at controlled rate - Personally Reviewed  ECG    No new tracing   Physical Exam   GEN: No acute distress.   Neck: No JVD Cardiac: RRR, no murmurs, rubs, or gallops.  Respiratory: Clear to auscultation bilaterally. GI: Soft, nontender, non-distended  MS: No edema; No deformity. Neuro:  Nonfocal  Psych: Normal affect   Labs    High Sensitivity Troponin:  No results for  input(s): TROPONINIHS in the last 720 hours.    Chemistry Recent Labs  Lab 12/22/18 2016  12/23/18 0154 12/24/18 0220 12/25/18 0201 12/27/18 0136  NA 137   < > 136 139 140 140  K 4.2   < > 4.3 4.2 3.7 3.8  CL 107   < > 107 106 108 106  CO2 20*  --  17* 24 24 26   GLUCOSE 182*   < > 219* 112* 119* 113*  BUN 16   < > 17 13 9 8   CREATININE 1.21   < > 1.27* 0.99 0.88 0.82  CALCIUM 8.4*  --  8.0* 8.1* 8.0* 8.2*  PROT 6.1*  --  5.5*  --   --   --   ALBUMIN 2.8*  --  2.6*  --   --   --   AST 19  --  14*  --   --   --   ALT 15  --  15  --   --   --   ALKPHOS 59  --  51  --   --   --   BILITOT 0.6  --  0.8  --   --   --  GFRNONAA >60  --  58* >60 >60 >60  GFRAA >60  --  >60 >60 >60 >60  ANIONGAP 10  --  12 9 8 8    < > = values in this interval not displayed.     Hematology Recent Labs  Lab 12/25/18 0201 12/25/18 0917 12/26/18 0220 12/27/18 0136  WBC 17.5*  --  14.0* 13.7*  RBC 2.24*  --  2.53* 2.60*  HGB 6.6* 9.0* 7.4* 7.7*  HCT 21.5* 28.7* 24.1* 24.7*  MCV 96.0  --  95.3 95.0  MCH 29.5  --  29.2 29.6  MCHC 30.7  --  30.7 31.2  RDW 15.0  --  14.6 15.3  PLT 280  --  252 279    Radiology    No results found.  Cardiac Studies   Echo 12/26/2018  1. The left ventricle has normal systolic function, with an ejection fraction of 55-60%. The cavity size was normal. There is mildly increased left ventricular wall thickness. Left ventricular diastolic Doppler parameters are consistent with  pseudonormalization. No evidence of left ventricular regional wall motion abnormalities.  2. There is dilatation of the aortic root.  3. The aortic valve is tricuspid. No stenosis of the aortic valve.  4. The right ventricle has normal systolic function. The cavity was normal. There is no increase in right ventricular wall thickness.  5. No evidence of mitral valve stenosis. Trivial mitral regurgitation.  6. Left atrial size was mildly dilated.  7. Normal IVC size. PA systolic pressure 36  mmHg.  Patient Profile     68 y.o. male with a hx of paroxysmal atrial fibrillation, PSVT, sleep apnea on BiPap, hypertension and recent admission for sepsis from possible duodenal perforation who admitted 12/22/2018 with acute hemorrhagic shock 2nd blood loss form hematuria. Cardiology consulted for afib RVR.    Assessment & Plan    1. PAF with RVR - Converted to sinus rhythm on IV amiodarone. Continue current dose of Toprol XL and amiodarone. Reduce amiodarone to 200mg  at discharge.  - Echo showed preserved LVEF at 55-60%. - CHADsVASC score of 2 for age and HTN. Per Dr. Tresa Moore "McCammon INDEFINITELY"  2. PSVT - Has appointment with Dr. Lovena Le 9/28 for further discussion   3. Hematuria with clots - May be cystoscopy on Friday  4. Acute blood loss anemia  - Per primary. Transfuse as needed        For questions or updates, please contact [place] Please consult [email or web address] for contact info under        Signed, Leanor Kail, PA  12/27/2018, 9:17 AM    Doing well telemetry with NSR Foley with lighter red tinge no clots. For cystoscopy Friday continue to hold  Anticoagulation cardiac status stable  Jenkins Rouge

## 2018-12-27 NOTE — Progress Notes (Signed)
Subjective/Chief Complaint:   1 - Acute Renal Failure / Urinary Retention / Bilateral Hydronephrosis - Baseline Cr <1.2 with rise to 3s on admit for abdominal pain, malaise, ileus few weeks after total knee replacement. CT 8/13 on intake with very large prostate and symmetric bilateral hydro to bladder. Also on ACEI. After foley and hydration Cr back to <1.2 and resolved hydro by repeat imaging 8/16. GFR back to baseline with bladder drainage. Placed on CIC by Rosana Hoes at [facility] 11/2018 and sig heamturia with this as he was on blood thinners.   2 - Very Large Prostate - 80mL prostate with huge median lobe by CT 11/2018. He is on oxybutynin at baseline.   3 - Prostate Screening / Elevated PSA - H/o elevated PSA and negative BX by Rosana Hoes in past per report.  4 - Gross Hematuria / Clot Rention / Anemia -admitted 12/2018 with massive clot retention / anemia after being on attempted self-cath. He is on blood thinners for AFib w/o CVA/clots.   PMH sig for obesity, total knee, AFib/Eliquus (follows [facility]). His PCP is Dr. Ivonne Andrew who is company MD at [employer] where he works.   Today "Philip Jackson" is stable. Hematuria clearing some but persistent on irrigation and holding blood thinners.    Objective: Vital signs in last 24 hours: Temp:  [97.9 F (36.6 C)-98.2 F (36.8 C)] 98.2 F (36.8 C) (09/09 0432) Pulse Rate:  [66-95] 71 (09/09 0400) Resp:  [13-20] 18 (09/09 0400) BP: (94-123)/(40-70) 123/70 (09/09 0400) SpO2:  [92 %-96 %] 94 % (09/09 0400) Last BM Date: 12/26/18  Intake/Output from previous day: 09/08 0701 - 09/09 0700 In: 9330 [P.O.:180; IV Piggyback:150] Out: 26675 [Urine:26675] Intake/Output this shift: Total I/O In: -  Out: 1900 [Urine:1900]  General appearance: alert, cooperative and appears stated age Eyes: negative Nose: Nares normal. Septum midline. Mucosa normal. No drainage or sinus tenderness. Throat: lips, mucosa, and tongue normal; teeth and gums  normal Neck: supple, symmetrical, trachea midline Back: symmetric, no curvature. ROM normal. No CVA tenderness. Resp: non-labored on minimal NCO2 Cardio: Reg rate by monitor in 70s.  GI: soft, non-tender; bowel sounds normal; no masses,  no organomegaly Male genitalia: normal, 3 way foley in place on NS irrigation with improved but persisant hematuraik, medium pink, no clots. Extremities: extremities normal, atraumatic, no cyanosis or edema Skin: Skin color, texture, turgor normal. No rashes or lesions Lymph nodes: Cervical, supraclavicular, and axillary nodes normal. Neurologic: Grossly normal  Lab Results:  Recent Labs    12/26/18 0220 12/27/18 0136  WBC 14.0* 13.7*  HGB 7.4* 7.7*  HCT 24.1* 24.7*  PLT 252 279   BMET Recent Labs    12/25/18 0201 12/27/18 0136  NA 140 140  K 3.7 3.8  CL 108 106  CO2 24 26  GLUCOSE 119* 113*  BUN 9 8  CREATININE 0.88 0.82  CALCIUM 8.0* 8.2*   PT/INR No results for input(s): LABPROT, INR in the last 72 hours. ABG No results for input(s): PHART, HCO3 in the last 72 hours.  Invalid input(s): PCO2, PO2  Studies/Results: No results found.  Anti-infectives: Anti-infectives (From admission, onward)   Start     Dose/Rate Route Frequency Ordered Stop   12/25/18 1830  piperacillin-tazobactam (ZOSYN) IVPB 3.375 g     3.375 g 12.5 mL/hr over 240 Minutes Intravenous Every 8 hours 12/25/18 1755     12/23/18 0400  piperacillin-tazobactam (ZOSYN) IVPB 3.375 g  Status:  Discontinued     3.375 g 12.5  mL/hr over 240 Minutes Intravenous Every 8 hours 12/23/18 0207 12/24/18 1446   12/22/18 2130  vancomycin (VANCOCIN) IVPB 1000 mg/200 mL premix     1,000 mg 200 mL/hr over 60 Minutes Intravenous  Once 12/22/18 2117 12/22/18 2333   12/22/18 2045  piperacillin-tazobactam (ZOSYN) IVPB 3.375 g     3.375 g 100 mL/hr over 30 Minutes Intravenous  Once 12/22/18 2033 12/22/18 2230      Assessment/Plan:  Continue current foley on irrigation, It will  take days for his hematuria, which is likely form massive BPH, to stop. HOLD BLOOD THINNERS INDEFINITELY in setting of this sort of blood loss, especially as they are for prevention indication only. Begin daily finasteride as this is best medical therapy for prostate source bleeding.   Agree with PRN transfusion until Hgb stabilizes.   He would likely be best served long term with elective simple prostatectomy if it can be proven that he has preserved bladder contractility.  If he is still with drifting Hgb and transfusion dependant by Friday, we may consider OR cysto / fulguration as temporizing measure. He is amenable and in agreement.   Alexis Frock 12/27/2018

## 2018-12-27 NOTE — Progress Notes (Signed)
PROGRESS NOTE    Philip Jackson  [id number] DOB: 20-Nov-1950 DOA: 12/22/2018 PCP: Leonides Sake, MD   Brief Narrative:  68 year old male with a history of paroxysmal atrial fibrillation, sleep apnea who was discharged 2 weeks ago after admitted for sepsis from intra-abdominal cause at that time he had possible contained duodenal perforation.  Also had acute kidney injury with hematuria and urine retention.  At that time urology saw the patient and he was discharged home.  Patient was doing self cath for past 2 days.  He noticed worsening hematuria became weak and diaphoretic.  EMS was called.  In the ED patient became hypotensive with blood pressure systolic in 0000000, tachycardia showed elevated lactate and leukocytosis.  He was given emergent blood transfusion as there was 5 g drop in hemoglobin with frank hematuria. CT scan showed bladder mass versus clot. Urology was consulted and continuous bladder irrigation started.  Hospital course complicated by atrial fibrillation with RVR therefore cardiology consult.   Assessment & Plan:   Principal Problem:   Acute blood loss anemia Active Problems:   OSA treated with BiPAP   Hematuria   PAF (paroxysmal atrial fibrillation) (HCC)   Acute blood loss anemia/hemorrhagic shock - Hemodynamically appears to be stable now.  Continues to have significant amount of hematuria.  Closely monitor hemoglobin. -Urology team following. -Continuous bladder irrigation.  If hemoglobin less than 7.0, will order transfusion -Plans for cystoscopy per urology.  Urinary tract infection, E. coli.  Present on admission -This was diagnosed outpatient at his primary care's office.  Records indicate it is pansensitive, will transition antibiotics to Keflex and monitor clinically  Atrial fibrillation with RVR, improved Paroxysmal atrial fibrillation -Currently patient is in normal sinus rhythm.  Off anticoagulation due to hematuria.  Urology recommends indefinite  discontinuation of Eliquis as it is for preventative measures for now. -Cardiology following.  Amiodarone twice daily per cardiology -Echocardiogram  Iron deficiency anemia -On iron supplements.  Bowel regimen  DVT prophylaxis: SCDs Code Status: Full code Family Communication: None at bedside Disposition Plan: Maintain inpatient stay for hematuria  Consultants:   Urology  Cardiology  Procedures:   None  Antimicrobials:   Keflex   Subjective: Feels little weak and continues to have hematuria.  Review of Systems Otherwise negative except as per HPI, including: General: Denies fever, chills, night sweats or unintended weight loss. Resp: Denies cough, wheezing, shortness of breath. Cardiac: Denies chest pain, palpitations, orthopnea, paroxysmal nocturnal dyspnea. GI: Denies abdominal pain, nausea, vomiting, diarrhea or constipation GU: Denies dysuria, frequency, hesitancy or incontinence MS: Denies muscle aches, joint pain or swelling Neuro: Denies headache, neurologic deficits (focal weakness, numbness, tingling), abnormal gait Psych: Denies anxiety, depression, SI/HI/AVH Skin: Denies new rashes or lesions ID: Denies sick contacts, exotic exposures, travel  Objective: Vitals:   12/27/18 0000 12/27/18 0400 12/27/18 0432 12/27/18 0800  BP: 115/64 123/70    Pulse: 66 71    Resp: 15 18    Temp: 97.9 F (36.6 C)  98.2 F (36.8 C) 98 F (36.7 C)  TempSrc: Oral  Oral Oral  SpO2: 92% 94%    Weight:      Height:        Intake/Output Summary (Last 24 hours) at 12/27/2018 1108 Last data filed at 12/27/2018 1038 Gross per 24 hour  Intake 15496.99 ml  Output 32575 ml  Net -17078.01 ml   Filed Weights   12/22/18 1855 12/22/18 1904  Weight: 85.7 kg 85.7 kg    Examination:  General exam:  Appears calm and comfortable  Respiratory system: Clear to auscultation. Respiratory effort normal. Cardiovascular system: S1 & S2 heard, RRR. No JVD, murmurs, rubs, gallops or  clicks. No pedal edema. Gastrointestinal system: Abdomen is nondistended, soft and nontender. No organomegaly or masses felt. Normal bowel sounds heard. Central nervous system: Alert and oriented. No focal neurological deficits. Extremities: Symmetric 5 x 5 power. Skin: No rashes, lesions or ulcers Psychiatry: Judgement and insight appear normal. Mood & affect appropriate.   Foley catheter in place for continuous bladder irrigation  Data Reviewed:   CBC: Recent Labs  Lab 12/22/18 2016  12/23/18 1131 12/24/18 0220 12/25/18 0201 12/25/18 0917 12/26/18 0220 12/27/18 0136  WBC 20.7*   < > 15.2* 15.5* 17.5*  --  14.0* 13.7*  NEUTROABS 17.2*  --   --   --   --   --   --   --   HGB 8.6*   < > 8.1* 7.1* 6.6* 9.0* 7.4* 7.7*  HCT 28.1*   < > 25.1* 22.8* 21.5* 28.7* 24.1* 24.7*  MCV 95.9   < > 90.9 93.4 96.0  --  95.3 95.0  PLT 381   < > 261 250 280  --  252 279   < > = values in this interval not displayed.   Basic Metabolic Panel: Recent Labs  Lab 12/22/18 2016 12/22/18 2019 12/23/18 0154 12/24/18 0220 12/25/18 0201 12/27/18 0136  NA 137 138 136 139 140 140  K 4.2 4.3 4.3 4.2 3.7 3.8  CL 107 105 107 106 108 106  CO2 20*  --  17* 24 24 26   GLUCOSE 182* 178* 219* 112* 119* 113*  BUN 16 14 17 13 9 8   CREATININE 1.21 1.10 1.27* 0.99 0.88 0.82  CALCIUM 8.4*  --  8.0* 8.1* 8.0* 8.2*   GFR: Estimated Creatinine Clearance: 93.5 mL/min (by C-G formula based on SCr of 0.82 mg/dL). Liver Function Tests: Recent Labs  Lab 12/22/18 2016 12/23/18 0154  AST 19 14*  ALT 15 15  ALKPHOS 59 51  BILITOT 0.6 0.8  PROT 6.1* 5.5*  ALBUMIN 2.8* 2.6*   Recent Labs  Lab 12/22/18 2016  LIPASE 55*   No results for input(s): AMMONIA in the last 168 hours. Coagulation Profile: No results for input(s): INR, PROTIME in the last 168 hours. Cardiac Enzymes: No results for input(s): CKTOTAL, CKMB, CKMBINDEX, TROPONINI in the last 168 hours. BNP (last 3 results) No results for input(s):  PROBNP in the last 8760 hours. HbA1C: No results for input(s): HGBA1C in the last 72 hours. CBG: Recent Labs  Lab 12/26/18 1429  GLUCAP 120*   Lipid Profile: No results for input(s): CHOL, HDL, LDLCALC, TRIG, CHOLHDL, LDLDIRECT in the last 72 hours. Thyroid Function Tests: No results for input(s): TSH, T4TOTAL, FREET4, T3FREE, THYROIDAB in the last 72 hours. Anemia Panel: No results for input(s): VITAMINB12, FOLATE, FERRITIN, TIBC, IRON, RETICCTPCT in the last 72 hours. Sepsis Labs: Recent Labs  Lab 12/22/18 2016 12/23/18 0133 12/23/18 0154 12/23/18 0702  PROCALCITON  --   --  <0.10  --   LATICACIDVEN 4.7* 3.6*  --  2.4*    Recent Results (from the past 240 hour(s))  Urine culture     Status: None   Collection Time: 12/22/18  8:16 PM   Specimen: Urine, Clean Catch  Result Value Ref Range Status   Specimen Description   Final    URINE, CLEAN CATCH Performed at [facility], [place] [address] [place], [place]  [id number]    Special Requests   Final    NONE Performed at [facility], [place] [address], [place], [place] [address]    Culture   Final    NO GROWTH Performed at [facility], [place] [address], [place], [place] [address]    Report Status 12/24/2018 FINAL  Final  Blood culture (routine x 2)     Status: None   Collection Time: 12/22/18  8:55 PM   Specimen: BLOOD  Result Value Ref Range Status   Specimen Description   Final    BLOOD BLOOD LEFT ARM Performed at [place] [address], [place], [place] [address]    Special Requests   Final    BOTTLES DRAWN AEROBIC AND ANAEROBIC Blood Culture adequate volume Performed at [place] [address], [place], [place] [address]    Culture   Final    NO GROWTH 5 DAYS Performed at [facility], [place] [address], [place], [place] [address]    Report Status 12/27/2018 FINAL  Final  Blood culture (routine x 2)     Status: None    Collection Time: 12/22/18  8:55 PM   Specimen: BLOOD  Result Value Ref Range Status   Specimen Description   Final    BLOOD LEFT ANTECUBITAL Performed at [place] [address], [place], [place] [address]    Special Requests   Final    BOTTLES DRAWN AEROBIC AND ANAEROBIC Blood Culture results may not be optimal due to an inadequate volume of blood received in culture bottles Performed at [place] [address] [place], [place] [address]    Culture   Final    NO GROWTH 5 DAYS Performed at [facility], [place] [address], [place], [place] [address]    Report Status 12/27/2018 FINAL  Final  SARS Coronavirus 2 [facility] order, Performed in [facility] hospital lab) Nasopharyngeal Nasopharyngeal Swab     Status: None   Collection Time: 12/22/18  8:55 PM   Specimen: Nasopharyngeal Swab  Result Value Ref Range Status   SARS Coronavirus 2 NEGATIVE NEGATIVE Final    Comment: (NOTE) If result is NEGATIVE SARS-CoV-2 target nucleic acids are NOT DETECTED. The SARS-CoV-2 RNA is generally detectable in upper and lower  respiratory specimens during the acute phase of infection. The lowest  concentration of SARS-CoV-2 viral copies this assay can detect is 250  copies / mL. A negative result does not preclude SARS-CoV-2 infection  and should not be used as the sole basis for treatment or other  patient management decisions.  A negative result may occur with  improper specimen collection / handling, submission of specimen other  than nasopharyngeal swab, presence of viral mutation(s) within the  areas targeted by this assay, and inadequate number of viral copies  (<250 copies / mL). A negative result must be combined with clinical  observations, patient history, and epidemiological information. If result is POSITIVE SARS-CoV-2 target nucleic acids are DETECTED. The SARS-CoV-2 RNA is generally detectable in upper and lower  respiratory  specimens dur ing the acute phase of infection.  Positive  results are indicative of active infection with SARS-CoV-2.  Clinical  correlation with patient history and other diagnostic information is  necessary to determine patient infection status.  Positive results do  not rule out bacterial infection or co-infection with other viruses. If result is PRESUMPTIVE POSTIVE SARS-CoV-2 nucleic acids MAY BE PRESENT.  A presumptive positive result was obtained on the submitted specimen  and confirmed on repeat testing.  While 2019 novel coronavirus  (SARS-CoV-2) nucleic acids may be present in the submitted sample  additional confirmatory testing may be necessary for epidemiological  and / or clinical management purposes  to differentiate between  SARS-CoV-2 and other Sarbecovirus currently known to infect humans.  If clinically indicated additional testing with an alternate test  methodology [phone]) is advised. The SARS-CoV-2 RNA is generally  detectable in upper and lower respiratory sp ecimens during the acute  phase of infection. The expected result is Negative. Fact Sheet for Patients:  StrictlyIdeas.no Fact Sheet for Healthcare Providers: BankingDealers.co.za This test is not yet approved or cleared by the [place] FDA and has been authorized for detection and/or diagnosis of SARS-CoV-2 by FDA under an Emergency Use Authorization (EUA).  This EUA will remain in effect (meaning this test can be used) for the duration of the COVID-19 declaration under Section 564(b)(1) of the Act, 21 U.S.C. section 360bbb-3(b)(1), unless the authorization is terminated or revoked sooner. Performed at [facility], [place] [address] [place], [place] [address]   MRSA PCR Screening     Status: None   Collection Time: 12/23/18  2:19 AM   Specimen: Nasal Mucosa; Nasopharyngeal  Result Value Ref Range Status   MRSA by PCR NEGATIVE  NEGATIVE Final    Comment:        The GeneXpert MRSA Assay (FDA approved for NASAL specimens only), is one component of a comprehensive MRSA colonization surveillance program. It is not intended to diagnose MRSA infection nor to guide or monitor treatment for MRSA infections. Performed at [facility], [place] [address], [place], [place] [address]          Radiology Studies: No results found.      Scheduled Meds: . amiodarone  400 mg Oral BID  . Chlorhexidine Gluconate Cloth  6 each Topical Daily  . ferrous sulfate  325 mg Oral 2 times weekly  . finasteride  5 mg Oral Daily  . mouth rinse  15 mL Mouth Rinse BID  . metoprolol succinate  100 mg Oral Daily  . mirabegron ER  25 mg Oral Daily  . omega-3 acid ethyl esters  1 g Oral BID  . pantoprazole  40 mg Oral Daily  . rosuvastatin  20 mg Oral QHS  . senna-docusate  1 tablet Oral BID   Continuous Infusions: . piperacillin-tazobactam (ZOSYN)  IV Stopped (12/27/18 1018)     LOS: 5 days   Time spent= 35 mins    Dimitrios Balestrieri Arsenio Loader, MD [facility]  If 7PM-7AM, please contact night-coverage [email or web address] 12/27/2018, 11:08 AM

## 2018-12-28 ENCOUNTER — Encounter: Payer: BC Managed Care – PPO | Admitting: Physical Therapy

## 2018-12-28 LAB — BASIC METABOLIC PANEL
Anion gap: 10 (ref 5–15)
BUN: 8 mg/dL (ref 8–23)
CO2: 23 mmol/L (ref 22–32)
Calcium: 8.1 mg/dL — ABNORMAL LOW (ref 8.9–10.3)
Chloride: 106 mmol/L (ref 98–111)
Creatinine, Ser: 0.77 mg/dL (ref 0.61–1.24)
GFR calc Af Amer: >60 mL/min (ref >60)
GFR calc non Af Amer: >60 mL/min (ref >60)
Glucose, Bld: 104 mg/dL — ABNORMAL HIGH (ref 70–99)
Potassium: 3.6 mmol/L (ref 3.5–5.1)
Sodium: 139 mmol/L (ref 135–145)

## 2018-12-28 LAB — CBC
HCT: 27.5 % — ABNORMAL LOW (ref 39.0–52.0)
Hemoglobin: 8.4 g/dL — ABNORMAL LOW (ref 13.0–17.0)
MCH: 29.3 pg (ref 26.0–34.0)
MCHC: 30.5 g/dL (ref 30.0–36.0)
MCV: 95.8 fL (ref 80.0–100.0)
Platelets: 289 10*3/uL (ref 150–400)
RBC: 2.87 MIL/uL — ABNORMAL LOW (ref 4.22–5.81)
RDW: 15.1 % (ref 11.5–15.5)
WBC: 14.6 10*3/uL — ABNORMAL HIGH (ref 4.0–10.5)
nRBC: 0 % (ref 0.0–0.2)

## 2018-12-28 LAB — MAGNESIUM: Magnesium: 2 mg/dL (ref 1.7–2.4)

## 2018-12-28 MED ORDER — POTASSIUM CHLORIDE CRYS ER 20 MEQ PO TBCR
40.0000 meq | EXTENDED_RELEASE_TABLET | Freq: Once | ORAL | Status: AC
  Administered 2018-12-28: 09:00:00 40 meq via ORAL
  Filled 2018-12-28: qty 2

## 2018-12-28 NOTE — Progress Notes (Signed)
Subjective/Chief Complaint:    1 - Acute Renal Failure / Urinary Retention / Bilateral Hydronephrosis - Baseline Cr <1.2 with rise to 3s on admit for abdominal pain, malaise, ileus few weeks after total knee replacement. CT 8/13 on intake with very large prostate and symmetric bilateral hydro to bladder. Also on ACEI. After foley and hydration Cr back to <1.2 and resolved hydro by repeat imaging 8/16. GFR back to baseline with bladder drainage. Placed on CIC by Rosana Hoes at [facility] 11/2018 and sig heamturia with this as he was on blood thinners.   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 He is on oxybutynin at baseline.   3 - Prostate Screening / Elevated PSA - H/o elevated PSA and negative BX by Rosana Hoes in past per report.  4 - Gross Hematuria / Clot Rention / Anemia -admitted 12/2018 with massive clot retention / anemia after being on attempted self-cath. He is on blood thinners for AFib w/o CVA/clots.   PMH sig for obesity, total knee, AFib/Eliquus (follows [facility]). His PCP is Dr. Ivonne Andrew who is company MD at [employer] where he works.   Today "Leib" is stable. Hematuria clearing some but persistent with occasional problematic clots. Fortunately Hgb stabilizing and no more transfusions.    Objective: Vital signs in last 24 hours: Temp:  [97.9 F (36.6 C)-98.3 F (36.8 C)] 98.2 F (36.8 C) (09/10 0639) Pulse Rate:  [64-73] 73 (09/10 0400) Resp:  [18-25] 21 (09/10 0400) BP: (94-120)/(57-69) 114/66 (09/10 0400) SpO2:  [95 %-98 %] 95 % (09/10 0400) Last BM Date: 12/27/18  Intake/Output from previous day: 09/09 0701 - 09/10 0700 In: 58343.7 [P.O.:600; IV Piggyback:43.7] Out: 54400 [Urine:54400] Intake/Output this shift: Total I/O In: -  Out: 2000 [Urine:2000]   General appearance: alert, cooperative and appears stated age Eyes: negative Nose: Nares normal. Septum midline. Mucosa normal. No drainage or sinus tenderness. Throat:  lips, mucosa, and tongue normal; teeth and gums normal Neck: supple, symmetrical, trachea midline Back: symmetric, no curvature. ROM normal. No CVA tenderness. Resp: non-labored on minimal NCO2 Cardio: Reg rate by monitor in 70s.  GI: soft, non-tender; bowel sounds normal; no masses,  no organomegaly Male genitalia: normal, 3 way foley in place on NS irrigation with improved but persisant hematurai, medium pink, no clots at present but large overnight per NSG. Extremities: extremities normal, atraumatic, no cyanosis or edema Skin: Skin color, texture, turgor normal. No rashes or lesions Lymph nodes: Cervical, supraclavicular, and axillary nodes normal. Neurologic: Grossly normal  Lab Results:  Recent Labs    12/27/18 0136 12/28/18 0149  WBC 13.7* 14.6*  HGB 7.7* 8.4*  HCT 24.7* 27.5*  PLT 279 289   BMET Recent Labs    12/27/18 0136 12/28/18 0149  NA 140 139  K 3.8 3.6  CL 106 106  CO2 26 23  GLUCOSE 113* 104*  BUN 8 8  CREATININE 0.82 0.77  CALCIUM 8.2* 8.1*   PT/INR No results for input(s): LABPROT, INR in the last 72 hours. ABG No results for input(s): PHART, HCO3 in the last 72 hours.  Invalid input(s): PCO2, PO2  Studies/Results: No results found.  Anti-infectives: Anti-infectives (From admission, onward)   Start     Dose/Rate Route Frequency Ordered Stop   12/27/18 1700  cephALEXin (KEFLEX) capsule 500 mg     500 mg Oral 3 times daily with meals & bedtime 12/27/18 1110     12/25/18 1830  piperacillin-tazobactam (ZOSYN) IVPB  3.375 g  Status:  Discontinued     3.375 g 12.5 mL/hr over 240 Minutes Intravenous Every 8 hours 12/25/18 1755 12/27/18 1110   12/23/18 0400  piperacillin-tazobactam (ZOSYN) IVPB 3.375 g  Status:  Discontinued     3.375 g 12.5 mL/hr over 240 Minutes Intravenous Every 8 hours 12/23/18 0207 12/24/18 1446   12/22/18 2130  vancomycin (VANCOCIN) IVPB 1000 mg/200 mL premix     1,000 mg 200 mL/hr over 60 Minutes Intravenous  Once 12/22/18  2117 12/22/18 2333   12/22/18 2045  piperacillin-tazobactam (ZOSYN) IVPB 3.375 g     3.375 g 100 mL/hr over 30 Minutes Intravenous  Once 12/22/18 2033 12/22/18 2230      Assessment/Plan:  Continue current foley on irrigation, and holding blood thinners as he has/had hemodynamically significant bleed. Although active bleeding appears to be improved, he is still having problems from large clot burden that is prolonging his hospital stay. Discussed options of contineud care v. OR eval for clot evac and fulgeration tomorrow to help hasten current clot reduction and reduce / temporize active bleed and he is in agreement. Will reassess in AM tomorrow but NPO p MN tonight and post for OR tomorrow afternoon. Risks, benefits, peri-op course discussed.   Agree with PRN transfusion until Hgb stabilizes, fortunately improved at present.   He would likely be best served long term with elective simple prostatectomy if it can be proven that he has preserved bladder contractility.   Appreciate medical team and cards team comanagement. Please call me directly with questions anytime.   Alexis Frock 12/28/2018

## 2018-12-28 NOTE — Progress Notes (Addendum)
Progress Note  Patient Name: Philip Jackson Date of Encounter: 12/28/2018  Primary Cardiologist: Sinclair Grooms, MD   Subjective   Feeling better. No chest pain or dyspnea.   Inpatient Medications    Scheduled Meds: . amiodarone  400 mg Oral BID  . cephALEXin  500 mg Oral TID WC & HS  . Chlorhexidine Gluconate Cloth  6 each Topical Daily  . ferrous sulfate  325 mg Oral 2 times weekly  . finasteride  5 mg Oral Daily  . mouth rinse  15 mL Mouth Rinse BID  . metoprolol succinate  100 mg Oral Daily  . mirabegron ER  25 mg Oral Daily  . omega-3 acid ethyl esters  1 g Oral BID  . pantoprazole  40 mg Oral Daily  . potassium chloride  40 mEq Oral Once  . rosuvastatin  20 mg Oral QHS  . senna-docusate  1 tablet Oral BID   Continuous Infusions:  PRN Meds: acetaminophen **OR** acetaminophen, HYDROmorphone (DILAUDID) injection, ondansetron **OR** ondansetron (ZOFRAN) IV, opium-belladonna, oxybutynin, oxyCODONE, polyethylene glycol, senna-docusate   Vital Signs    Vitals:   12/28/18 0000 12/28/18 0306 12/28/18 0400 12/28/18 0639  BP:   114/66   Pulse: 64  73   Resp: (!) 25  (!) 21   Temp:  97.9 F (36.6 C)  98.2 F (36.8 C)  TempSrc:  Axillary  Oral  SpO2: 98%  95%   Weight:      Height:        Intake/Output Summary (Last 24 hours) at 12/28/2018 0853 Last data filed at 12/28/2018 0719 Gross per 24 hour  Intake 58343.7 ml  Output 54500 ml  Net 3843.7 ml   Last 3 Weights 12/22/2018 12/22/2018 12/19/2018  Weight (lbs) 189 lb 189 lb 189 lb 12.8 oz  Weight (kg) 85.73 kg 85.73 kg 86.093 kg      Telemetry    SR at rate of 60s, intermittent bradycardia in high 30s to low 40s - Personally Reviewed  ECG    N/A  Physical Exam   GEN: No acute distress sitting on commode Neck: No JVD Cardiac: RRR, no murmurs, rubs, or gallops.  Respiratory: Clear to auscultation bilaterally. GI: Soft, nontender, non-distended  MS: No edema; No deformity. Neuro:  Nonfocal  Psych: Normal  affect   Labs     Chemistry Recent Labs  Lab 12/22/18 2016  12/23/18 0154  12/25/18 0201 12/27/18 0136 12/28/18 0149  NA 137   < > 136   < > 140 140 139  K 4.2   < > 4.3   < > 3.7 3.8 3.6  CL 107   < > 107   < > 108 106 106  CO2 20*  --  17*   < > 24 26 23   GLUCOSE 182*   < > 219*   < > 119* 113* 104*  BUN 16   < > 17   < > 9 8 8   CREATININE 1.21   < > 1.27*   < > 0.88 0.82 0.77  CALCIUM 8.4*  --  8.0*   < > 8.0* 8.2* 8.1*  PROT 6.1*  --  5.5*  --   --   --   --   ALBUMIN 2.8*  --  2.6*  --   --   --   --   AST 19  --  14*  --   --   --   --   ALT 15  --  15  --   --   --   --   ALKPHOS 59  --  51  --   --   --   --   BILITOT 0.6  --  0.8  --   --   --   --   GFRNONAA >60  --  58*   < > >60 >60 >60  GFRAA >60  --  >60   < > >60 >60 >60  ANIONGAP 10  --  12   < > 8 8 10    < > = values in this interval not displayed.     Hematology Recent Labs  Lab 12/26/18 0220 12/27/18 0136 12/28/18 0149  WBC 14.0* 13.7* 14.6*  RBC 2.53* 2.60* 2.87*  HGB 7.4* 7.7* 8.4*  HCT 24.1* 24.7* 27.5*  MCV 95.3 95.0 95.8  MCH 29.2 29.6 29.3  MCHC 30.7 31.2 30.5  RDW 14.6 15.3 15.1  PLT 252 279 289    Radiology    No results found.  Cardiac Studies   Echo 12/26/2018 1. The left ventricle has normal systolic function, with an ejection fraction of 55-60%. The cavity size was normal. There is mildly increased left ventricular wall thickness. Left ventricular diastolic Doppler parameters are consistent with  pseudonormalization. No evidence of left ventricular regional wall motion abnormalities. 2. There is dilatation of the aortic root. 3. The aortic valve is tricuspid. No stenosis of the aortic valve. 4. The right ventricle has normal systolic function. The cavity was normal. There is no increase in right ventricular wall thickness. 5. No evidence of mitral valve stenosis. Trivial mitral regurgitation. 6. Left atrial size was mildly dilated. 7. Normal IVC size. PA systolic  pressure 36 mmHg.  Patient Profile     68 y.o. male with a hx of paroxysmal atrial fibrillation, PSVT, sleep apneaon BiPap, hypertensionand recent admission for sepsis from possible duodenal perforation who admitted 12/22/2018 with acute hemorrhagic shock 2nd blood loss form hematuria. Cardiology consulted for afib RVR.   Assessment & Plan    1. PAF with RVR - Converted to sinus rhythm on IV amiodarone. HR stable at 60s with intermittent sinus bradycardia.  Continue current dose of Toprol XL and amiodarone. Reduce amiodarone to 200mg  at discharge.  - Echo showed preserved LVEF at 55-60%. - CHADsVASC score of 2 for age and HTN. Per Dr. Tresa Moore "[place] INDEFINITELY"  2. PSVT - Has appointment with Dr. Lovena Le 9/28 for further discussion   3. Hematuria with clots - Likely cystoscopy tomorrow   4. Acute blood loss anemia  - Per primary. Transfuse as needed  [facility] will sign off.   Medication Recommendations:  AS above Other recommendations (labs, testing, etc): N/A Follow up as an outpatient: Has been arranged   For questions or updates, please contact [facility] Please consult [email or web address] for contact info under        Signed, Leanor Kail, PA  12/28/2018, 8:53 AM    Patient examined chart reviewed Exam improved with NSR less pale abdomen benign bladder not distended Continue holding anticoagulation continue amiodarone Outpatient f/u with Dr Lovena Le will be arranged For cystoscopy in am Pathology will define safe period to resume anticoagulation Low risk for embolic event while in NSR  [facility]

## 2018-12-28 NOTE — Progress Notes (Signed)
Pt states that he wants to hold off on BIPAP tonight, machine remains at bedside if pt decides to wear.  RT to monitor and assess as needed.

## 2018-12-28 NOTE — Progress Notes (Signed)
PROGRESS NOTE    Philip Jackson  [id number] DOB: 10/08/1950 DOA: 12/22/2018 PCP: Leonides Sake, MD   Brief Narrative:  68 year old male with a history of paroxysmal atrial fibrillation, sleep apnea who was discharged 2 weeks ago after admitted for sepsis from intra-abdominal cause at that time he had possible contained duodenal perforation.  Also had acute kidney injury with hematuria and urine retention.  At that time urology saw the patient and he was discharged home.  Patient was doing self cath for past 2 days.  He noticed worsening hematuria became weak and diaphoretic.  EMS was called.  In the ED patient became hypotensive with blood pressure systolic in 0000000, tachycardia showed elevated lactate and leukocytosis.  He was given emergent blood transfusion as there was 5 g drop in hemoglobin with frank hematuria. CT scan showed bladder mass versus clot. Urology was consulted and continuous bladder irrigation started.  Hospital course complicated by atrial fibrillation with RVR therefore cardiology consult.  Bleeding appears to be improving.  Possible plans for cystoscopy tomorrow.   Assessment & Plan:   Principal Problem:   Acute blood loss anemia Active Problems:   OSA treated with BiPAP   Hematuria   PAF (paroxysmal atrial fibrillation) (HCC)   Acute blood loss anemia/hemorrhagic shock - Continues to hematuria but appears to be slightly better.. -Urology team following. -Continuous bladder irrigation.  If hemoglobin less than 7.0, will order transfusion -Plans for OR per urology for tomorrow.  Urinary tract infection, E. coli.  Present on admission -This was diagnosed outpatient at his primary care's office.  Records indicate it is pansensitive, continue Keflex  Atrial fibrillation with RVR, improved Paroxysmal atrial fibrillation -Currently patient is in normal sinus rhythm.  Off anticoagulation due to hematuria.  Urology recommends indefinite discontinuation of Eliquis as it is  for preventative measures for now. -Cardiology following.  Amiodarone twice daily. -Echocardiogram  Iron deficiency anemia -On iron supplements.  Bowel regimen  DVT prophylaxis: SCDs Code Status: Full code Family Communication: None at bedside Disposition Plan: Maintain inpatient.  Persistent hematuria plans for cystoscopy tomorrow.  Consultants:   Urology  Cardiology  Procedures:   None  Antimicrobials:   Keflex   Subjective: Persistent hematuria overnight.  No other acute events overnight.  Review of Systems Otherwise negative except as per HPI, including: General = no fevers, chills, dizziness, malaise, fatigue HEENT/EYES = negative for pain, redness, loss of vision, double vision, blurred vision, loss of hearing, sore throat, hoarseness, dysphagia Cardiovascular= negative for chest pain, palpitation, murmurs, lower extremity swelling Respiratory/lungs= negative for shortness of breath, cough, hemoptysis, wheezing, mucus production Gastrointestinal= negative for nausea, vomiting,, abdominal pain, melena, hematemesis Genitourinary= negative for Dysuria, , Change in Urinary Frequency MSK = Negative for arthralgia, myalgias, Back Pain, Joint swelling  Neurology= Negative for headache, seizures, numbness, tingling  Psychiatry= Negative for anxiety, depression, suicidal and homocidal ideation Allergy/Immunology= Medication/Food allergy as listed  Skin= Negative for Rash, lesions, ulcers, itching  Objective: Vitals:   12/28/18 0306 12/28/18 0400 12/28/18 0639 12/28/18 0800  BP:  114/66    Pulse:  73    Resp:  (!) 21    Temp: 97.9 F (36.6 C)  98.2 F (36.8 C) 98.1 F (36.7 C)  TempSrc: Axillary  Oral Oral  SpO2:  95%    Weight:      Height:        Intake/Output Summary (Last 24 hours) at 12/28/2018 1014 Last data filed at 12/28/2018 0854 Gross per 24 hour  Intake [id number]  ml  Output 55050 ml  Net -2990 ml   Filed Weights   12/22/18 1855 12/22/18 1904   Weight: 85.7 kg 85.7 kg    Examination:  Constitutional: NAD, calm, comfortable Eyes: PERRL, lids and conjunctivae normal ENMT: Mucous membranes are moist. Posterior pharynx clear of any exudate or lesions.Normal dentition.  Neck: normal, supple, no masses, no thyromegaly Respiratory: clear to auscultation bilaterally, no wheezing, no crackles. Normal respiratory effort. No accessory muscle use.  Cardiovascular: Regular rate and rhythm, no murmurs / rubs / gallops. No extremity edema. 2+ pedal pulses. No carotid bruits.  Abdomen: no tenderness, no masses palpated. No hepatosplenomegaly. Bowel sounds positive.  Musculoskeletal: no clubbing / cyanosis. No joint deformity upper and lower extremities. Good ROM, no contractures. Normal muscle tone.  Skin: no rashes, lesions, ulcers. No induration Neurologic: CN 2-12 grossly intact. Sensation intact, DTR normal. Strength 5/5 in all 4.  Psychiatric: Normal judgment and insight. Alert and oriented x 3. Normal mood.    Foley catheter in place with continuous bladder irrigation.  Hematuria noted.  Data Reviewed:   CBC: Recent Labs  Lab 12/22/18 2016  12/24/18 0220 12/25/18 0201 12/25/18 0917 12/26/18 0220 12/27/18 0136 12/28/18 0149  WBC 20.7*   < > 15.5* 17.5*  --  14.0* 13.7* 14.6*  NEUTROABS 17.2*  --   --   --   --   --   --   --   HGB 8.6*   < > 7.1* 6.6* 9.0* 7.4* 7.7* 8.4*  HCT 28.1*   < > 22.8* 21.5* 28.7* 24.1* 24.7* 27.5*  MCV 95.9   < > 93.4 96.0  --  95.3 95.0 95.8  PLT 381   < > 250 280  --  252 279 289   < > = values in this interval not displayed.   Basic Metabolic Panel: Recent Labs  Lab 12/23/18 0154 12/24/18 0220 12/25/18 0201 12/27/18 0136 12/28/18 0149  NA 136 139 140 140 139  K 4.3 4.2 3.7 3.8 3.6  CL 107 106 108 106 106  CO2 17* 24 24 26 23   GLUCOSE 219* 112* 119* 113* 104*  BUN 17 13 9 8 8   CREATININE 1.27* 0.99 0.88 0.82 0.77  CALCIUM 8.0* 8.1* 8.0* 8.2* 8.1*  MG  --   --   --   --  2.0    GFR: Estimated Creatinine Clearance: 95.9 mL/min (by C-G formula based on SCr of 0.77 mg/dL). Liver Function Tests: Recent Labs  Lab 12/22/18 2016 12/23/18 0154  AST 19 14*  ALT 15 15  ALKPHOS 59 51  BILITOT 0.6 0.8  PROT 6.1* 5.5*  ALBUMIN 2.8* 2.6*   Recent Labs  Lab 12/22/18 2016  LIPASE 55*   No results for input(s): AMMONIA in the last 168 hours. Coagulation Profile: No results for input(s): INR, PROTIME in the last 168 hours. Cardiac Enzymes: No results for input(s): CKTOTAL, CKMB, CKMBINDEX, TROPONINI in the last 168 hours. BNP (last 3 results) No results for input(s): PROBNP in the last 8760 hours. HbA1C: No results for input(s): HGBA1C in the last 72 hours. CBG: Recent Labs  Lab 12/26/18 1429  GLUCAP 120*   Lipid Profile: No results for input(s): CHOL, HDL, LDLCALC, TRIG, CHOLHDL, LDLDIRECT in the last 72 hours. Thyroid Function Tests: No results for input(s): TSH, T4TOTAL, FREET4, T3FREE, THYROIDAB in the last 72 hours. Anemia Panel: No results for input(s): VITAMINB12, FOLATE, FERRITIN, TIBC, IRON, RETICCTPCT in the last 72 hours. Sepsis Labs: Recent Labs  Lab  12/22/18 2016 12/23/18 0133 12/23/18 0154 12/23/18 0702  PROCALCITON  --   --  <0.10  --   LATICACIDVEN 4.7* 3.6*  --  2.4*    Recent Results (from the past 240 hour(s))  Urine culture     Status: None   Collection Time: 12/22/18  8:16 PM   Specimen: Urine, Clean Catch  Result Value Ref Range Status   Specimen Description   Final    URINE, CLEAN CATCH Performed at [facility], [place] [address] [place], [place] [address]    Special Requests   Final    NONE Performed at [facility], [place] [address], [place], [place] [address]    Culture   Final    NO GROWTH Performed at [facility], [place] [address], [place], [place] [address]    Report Status 12/24/2018 FINAL  Final  Blood culture (routine x 2)     Status: None   Collection Time:  12/22/18  8:55 PM   Specimen: BLOOD  Result Value Ref Range Status   Specimen Description   Final    BLOOD BLOOD LEFT ARM Performed at [place] [address] [place], [place] [address]    Special Requests   Final    BOTTLES DRAWN AEROBIC AND ANAEROBIC Blood Culture adequate volume Performed at [place] [address], [place], [place] [address]    Culture   Final    NO GROWTH 5 DAYS Performed at [facility], [place] [address] [place], [place] [address]    Report Status 12/27/2018 FINAL  Final  Blood culture (routine x 2)     Status: None   Collection Time: 12/22/18  8:55 PM   Specimen: BLOOD  Result Value Ref Range Status   Specimen Description   Final    BLOOD LEFT ANTECUBITAL Performed at [employer] [address], [place], [place] [address]    Special Requests   Final    BOTTLES DRAWN AEROBIC AND ANAEROBIC Blood Culture results may not be optimal due to an inadequate volume of blood received in culture bottles Performed at [place] [address], [place], [place] [address]    Culture   Final    NO GROWTH 5 DAYS Performed at [facility], [place] [address] [place], [place] [address]    Report Status 12/27/2018 FINAL  Final  SARS Coronavirus 2 [facility] order, Performed in [facility] hospital lab) Nasopharyngeal Nasopharyngeal Swab     Status: None   Collection Time: 12/22/18  8:55 PM   Specimen: Nasopharyngeal Swab  Result Value Ref Range Status   SARS Coronavirus 2 NEGATIVE NEGATIVE Final    Comment: (NOTE) If result is NEGATIVE SARS-CoV-2 target nucleic acids are NOT DETECTED. The SARS-CoV-2 RNA is generally detectable in upper and lower  respiratory specimens during the acute phase of infection. The lowest  concentration of SARS-CoV-2 viral copies this assay can detect is 250  copies / mL. A negative result does not preclude SARS-CoV-2 infection  and should not  be used as the sole basis for treatment or other  patient management decisions.  A negative result may occur with  improper specimen collection / handling, submission of specimen other  than nasopharyngeal swab, presence of viral mutation(s) within the  areas targeted by this assay, and inadequate number of viral copies  (<250 copies / mL). A negative result must be combined with clinical  observations,  patient history, and epidemiological information. If result is POSITIVE SARS-CoV-2 target nucleic acids are DETECTED. The SARS-CoV-2 RNA is generally detectable in upper and lower  respiratory specimens dur ing the acute phase of infection.  Positive  results are indicative of active infection with SARS-CoV-2.  Clinical  correlation with patient history and other diagnostic information is  necessary to determine patient infection status.  Positive results do  not rule out bacterial infection or co-infection with other viruses. If result is PRESUMPTIVE POSTIVE SARS-CoV-2 nucleic acids MAY BE PRESENT.   A presumptive positive result was obtained on the submitted specimen  and confirmed on repeat testing.  While 2019 novel coronavirus  (SARS-CoV-2) nucleic acids may be present in the submitted sample  additional confirmatory testing may be necessary for epidemiological  and / or clinical management purposes  to differentiate between  SARS-CoV-2 and other Sarbecovirus currently known to infect humans.  If clinically indicated additional testing with an alternate test  methodology [phone]) is advised. The SARS-CoV-2 RNA is generally  detectable in upper and lower respiratory sp ecimens during the acute  phase of infection. The expected result is Negative. Fact Sheet for Patients:  StrictlyIdeas.no Fact Sheet for Healthcare Providers: BankingDealers.co.za This test is not yet approved or cleared by the [place] FDA and has been authorized  for detection and/or diagnosis of SARS-CoV-2 by FDA under an Emergency Use Authorization (EUA).  This EUA will remain in effect (meaning this test can be used) for the duration of the COVID-19 declaration under Section 564(b)(1) of the Act, 21 U.S.C. section 360bbb-3(b)(1), unless the authorization is terminated or revoked sooner. Performed at [facility], [place] [address] [place], [place] [address]   MRSA PCR Screening     Status: None   Collection Time: 12/23/18  2:19 AM   Specimen: Nasal Mucosa; Nasopharyngeal  Result Value Ref Range Status   MRSA by PCR NEGATIVE NEGATIVE Final    Comment:        The GeneXpert MRSA Assay (FDA approved for NASAL specimens only), is one component of a comprehensive MRSA colonization surveillance program. It is not intended to diagnose MRSA infection nor to guide or monitor treatment for MRSA infections. Performed at [facility], [place] [address], [place], [place] [address]          Radiology Studies: No results found.      Scheduled Meds:  amiodarone  400 mg Oral BID   cephALEXin  500 mg Oral TID WC & HS   Chlorhexidine Gluconate Cloth  6 each Topical Daily   ferrous sulfate  325 mg Oral 2 times weekly   finasteride  5 mg Oral Daily   mouth rinse  15 mL Mouth Rinse BID   metoprolol succinate  100 mg Oral Daily   mirabegron ER  25 mg Oral Daily   omega-3 acid ethyl esters  1 g Oral BID   pantoprazole  40 mg Oral Daily   rosuvastatin  20 mg Oral QHS   senna-docusate  1 tablet Oral BID   Continuous Infusions:    LOS: 6 days   Time spent= 35 mins    Kijana Cromie Arsenio Loader, MD [facility]  If 7PM-7AM, please contact night-coverage [email or web address] 12/28/2018, 10:14 AM

## 2018-12-29 ENCOUNTER — Ambulatory Visit: Payer: BC Managed Care – PPO | Admitting: Physical Therapy

## 2018-12-29 ENCOUNTER — Inpatient Hospital Stay (HOSPITAL_COMMUNITY): Payer: BC Managed Care – PPO | Admitting: Certified Registered Nurse Anesthetist

## 2018-12-29 ENCOUNTER — Encounter (HOSPITAL_COMMUNITY): Payer: Self-pay

## 2018-12-29 ENCOUNTER — Ambulatory Visit: Payer: BC Managed Care – PPO | Admitting: Physician Assistant

## 2018-12-29 ENCOUNTER — Encounter (HOSPITAL_COMMUNITY)
Admission: EM | Disposition: A | Discharge status: Home / Self Care | Payer: Self-pay | Source: Home / Self Care | Attending: Family Medicine

## 2018-12-29 HISTORY — PX: TRANSURETHRAL RESECTION OF BLADDER TUMOR: SHX2575

## 2018-12-29 LAB — CBC
HCT: 29.8 % — ABNORMAL LOW (ref 39.0–52.0)
Hemoglobin: 9 g/dL — ABNORMAL LOW (ref 13.0–17.0)
MCH: 29 pg (ref 26.0–34.0)
MCHC: 30.2 g/dL (ref 30.0–36.0)
MCV: 96.1 fL (ref 80.0–100.0)
Platelets: 301 10*3/uL (ref 150–400)
RBC: 3.1 MIL/uL — ABNORMAL LOW (ref 4.22–5.81)
RDW: 14.9 % (ref 11.5–15.5)
WBC: 14.7 10*3/uL — ABNORMAL HIGH (ref 4.0–10.5)
nRBC: 0 % (ref 0.0–0.2)

## 2018-12-29 LAB — BASIC METABOLIC PANEL
Anion gap: 10 (ref 5–15)
BUN: 9 mg/dL (ref 8–23)
CO2: 23 mmol/L (ref 22–32)
Calcium: 8.7 mg/dL — ABNORMAL LOW (ref 8.9–10.3)
Chloride: 107 mmol/L (ref 98–111)
Creatinine, Ser: 0.79 mg/dL (ref 0.61–1.24)
GFR calc Af Amer: >60 mL/min (ref >60)
GFR calc non Af Amer: >60 mL/min (ref >60)
Glucose, Bld: 98 mg/dL (ref 70–99)
Potassium: 4.2 mmol/L (ref 3.5–5.1)
Sodium: 140 mmol/L (ref 135–145)

## 2018-12-29 LAB — MAGNESIUM: Magnesium: 1.9 mg/dL (ref 1.7–2.4)

## 2018-12-29 SURGERY — TURBT (TRANSURETHRAL RESECTION OF BLADDER TUMOR)
Anesthesia: General | Site: Bladder

## 2018-12-29 MED ORDER — LACTATED RINGERS IV SOLN
INTRAVENOUS | Status: DC
  Administered 2018-12-29 – 2018-12-30 (×3): via INTRAVENOUS

## 2018-12-29 MED ORDER — FENTANYL CITRATE (PF) 250 MCG/5ML IJ SOLN
INTRAMUSCULAR | Status: AC
  Filled 2018-12-29: qty 5

## 2018-12-29 MED ORDER — PROPOFOL 10 MG/ML IV BOLUS
INTRAVENOUS | Status: DC | PRN
  Administered 2018-12-29: 140 mg via INTRAVENOUS

## 2018-12-29 MED ORDER — FENTANYL CITRATE (PF) 100 MCG/2ML IJ SOLN
INTRAMUSCULAR | Status: DC | PRN
  Administered 2018-12-29: 100 ug via INTRAVENOUS
  Administered 2018-12-29 (×3): 50 ug via INTRAVENOUS

## 2018-12-29 MED ORDER — SODIUM CHLORIDE 0.9 % IV SOLN
INTRAVENOUS | Status: DC | PRN
  Administered 2018-12-29: 25 ug/min via INTRAVENOUS

## 2018-12-29 MED ORDER — MIDAZOLAM HCL 5 MG/5ML IJ SOLN
INTRAMUSCULAR | Status: DC | PRN
  Administered 2018-12-29: 1.5 mg via INTRAVENOUS
  Administered 2018-12-29: 0.5 mg via INTRAVENOUS

## 2018-12-29 MED ORDER — FENTANYL CITRATE (PF) 100 MCG/2ML IJ SOLN: 25.0000 ug | INTRAMUSCULAR | Status: DC | PRN

## 2018-12-29 MED ORDER — SODIUM CHLORIDE 0.9 % IR SOLN
3000.0000 mL | Status: DC
  Administered 2018-12-29 – 2018-12-31 (×4): 3000 mL

## 2018-12-29 MED ORDER — MIDAZOLAM HCL 2 MG/2ML IJ SOLN
INTRAMUSCULAR | Status: AC
  Filled 2018-12-29: qty 2

## 2018-12-29 MED ORDER — SODIUM CHLORIDE 0.9 % IR SOLN
Status: DC | PRN
  Administered 2018-12-29: 6000 mL via INTRAVESICAL

## 2018-12-29 MED ORDER — PHENYLEPHRINE HCL (PRESSORS) 10 MG/ML IV SOLN
INTRAVENOUS | Status: DC | PRN
  Administered 2018-12-29: 120 ug via INTRAVENOUS
  Administered 2018-12-29: 40 ug via INTRAVENOUS
  Administered 2018-12-29 (×2): 120 ug via INTRAVENOUS

## 2018-12-29 MED ORDER — ONDANSETRON HCL 4 MG/2ML IJ SOLN
INTRAMUSCULAR | Status: AC
  Filled 2018-12-29: qty 2

## 2018-12-29 MED ORDER — DEXAMETHASONE SODIUM PHOSPHATE 10 MG/ML IJ SOLN
INTRAMUSCULAR | Status: AC
  Filled 2018-12-29: qty 1

## 2018-12-29 MED ORDER — LIDOCAINE HCL (CARDIAC) PF 50 MG/5ML IV SOSY
PREFILLED_SYRINGE | INTRAVENOUS | Status: DC | PRN
  Administered 2018-12-29: 100 mg via INTRAVENOUS

## 2018-12-29 MED ORDER — CEFAZOLIN SODIUM-DEXTROSE 2-4 GM/100ML-% IV SOLN
2.0000 g | INTRAVENOUS | Status: AC
  Filled 2018-12-29 (×2): qty 100

## 2018-12-29 MED ORDER — ALBUMIN HUMAN 5 % IV SOLN
INTRAVENOUS | Status: DC | PRN
  Administered 2018-12-29: 17:00:00 via INTRAVENOUS

## 2018-12-29 SURGICAL SUPPLY — 17 items
BAG URINE DRAINAGE (UROLOGICAL SUPPLIES) ×2 IMPLANT
BAG URO CATCHER STRL LF (MISCELLANEOUS) ×2 IMPLANT
CATH FOLEY 3WAY 30CC 24FR (CATHETERS) ×1
CATH URTH STD 24FR FL 3W 2 (CATHETERS) ×1 IMPLANT
COVER WAND RF STERILE (DRAPES) IMPLANT
ELECT REM PT RETURN 15FT ADLT (MISCELLANEOUS) ×2 IMPLANT
EVACUATOR MICROVAS BLADDER (UROLOGICAL SUPPLIES) IMPLANT
GLOVE BIOGEL M STRL SZ7.5 (GLOVE) ×2 IMPLANT
GOWN STRL REUS W/TWL LRG LVL3 (GOWN DISPOSABLE) ×4 IMPLANT
KIT TURNOVER KIT A (KITS) IMPLANT
LOOP CUT BIPOLAR 24F LRG (ELECTROSURGICAL) ×2 IMPLANT
MANIFOLD NEPTUNE II (INSTRUMENTS) ×6 IMPLANT
PACK CYSTO (CUSTOM PROCEDURE TRAY) ×2 IMPLANT
SET ASPIRATION TUBING (TUBING) IMPLANT
SYRINGE IRR TOOMEY STRL 70CC (SYRINGE) ×2 IMPLANT
TUBING CONNECTING 10 (TUBING) ×2 IMPLANT
TUBING UROLOGY SET (TUBING) ×2 IMPLANT

## 2018-12-29 NOTE — Anesthesia Preprocedure Evaluation (Addendum)
Anesthesia Evaluation  Patient identified by MRN, date of birth, ID band Patient awake    Reviewed: Allergy & Precautions, NPO status , Patient's Chart, lab work & pertinent test results  Airway Mallampati: I  TM Distance: >3 FB Neck ROM: Full    Dental no notable dental hx. (+) Teeth Intact, Dental Advisory Given   Pulmonary sleep apnea and Continuous Positive Airway Pressure Ventilation , former smoker,    Pulmonary exam normal breath sounds clear to auscultation       Cardiovascular hypertension, Normal cardiovascular exam+ dysrhythmias Atrial Fibrillation  Rhythm:Regular Rate:Normal  TTE 12/2018 EF 55-60%, no significant valvular abnormalities  Stress Test 2016 Clinically negative for chest pain. Test was stopped due to atrial fib with RVR. EKG negative for ischemia. No significant arrhythmia noted.    Neuro/Psych negative neurological ROS  negative psych ROS   GI/Hepatic Neg liver ROS, GERD  ,  Endo/Other  negative endocrine ROS  Renal/GU negative Renal ROS  negative genitourinary   Musculoskeletal  (+) Arthritis ,   Abdominal   Peds  Hematology  (+) Blood dyscrasia (on eliquis, Hgb 9.0), anemia ,   Anesthesia Other Findings   Reproductive/Obstetrics                           Anesthesia Physical Anesthesia Plan  ASA: III  Anesthesia Plan: General   Post-op Pain Management:    Induction: Intravenous  PONV Risk Score and Plan: 2 and Ondansetron, Dexamethasone, Midazolam and Treatment may vary due to age or medical condition  Airway Management Planned: LMA  Additional Equipment:   Intra-op Plan:   Post-operative Plan: Extubation in OR  Informed Consent: I have reviewed the patients History and Physical, chart, labs and discussed the procedure including the risks, benefits and alternatives for the proposed anesthesia with the patient or authorized representative who has  indicated his/her understanding and acceptance.     Dental advisory given  Plan Discussed with: CRNA  Anesthesia Plan Comments:        Anesthesia Quick Evaluation

## 2018-12-29 NOTE — Progress Notes (Signed)
  Patient tolerated the procedure well.  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  Plan on starting patient on finasteride, monitor in the hospital over next 24-48 hours.  No need for antibiotics at the time of discharge or beyond 24 hours postsurgically. Urology will follow-up patient outpatient and will likely need simple prostatectomy.  Discussed with Dr. Tresa Moore.  Appreciate his input.

## 2018-12-29 NOTE — Progress Notes (Signed)
Patient transferred from [facility]. Vital signs stable. RN will continue to monitor patient.

## 2018-12-29 NOTE — Transfer of Care (Signed)
Immediate Anesthesia Transfer of Care Note  Patient: Philip Jackson  Procedure(s) Performed: CYSTO CLOT EVACUATION AND FULGERATION OF BLADDER (N/A Bladder)  Patient Location: PACU  Anesthesia Type:General  Level of Consciousness: awake, alert , oriented and patient cooperative  Airway & Oxygen Therapy: Patient Spontanous Breathing and Patient connected to face mask oxygen  Post-op Assessment: Report given to RN, Post -op Vital signs reviewed and stable and Patient moving all extremities X 4  Post vital signs: stable  Last Vitals:  Vitals Value Taken Time  BP 162/82 12/29/18 1745  Temp 36.7 C 12/29/18 1730  Pulse 65 12/29/18 1755  Resp 20 12/29/18 1755  SpO2 100 % 12/29/18 1755  Vitals shown include unvalidated device data.  Last Pain:  Vitals:   12/29/18 1730  TempSrc:   PainSc: 0-No pain      Patients Stated Pain Goal: 0 ([id number])  Complications: No apparent anesthesia complications

## 2018-12-29 NOTE — Brief Op Note (Signed)
12/22/2018 - 12/29/2018  5:17 PM  PATIENT:  Philip Jackson  68 y.o. male  PRE-OPERATIVE DIAGNOSIS:  bleeding  POST-OPERATIVE DIAGNOSIS:  bleeding  PROCEDURE:  Procedure(s): CYSTO CLOT EVACUATION AND FULGERATION OF BLADDER (N/A)  SURGEON:  Surgeon(s) and Role:    Alexis Frock, MD - Primary  PHYSICIAN ASSISTANT:   ASSISTANTS: none   ANESTHESIA:   general  EBL:  229mL   BLOOD ADMINISTERED:none  DRAINS: 55F 3 way foley to NS irrigation.    LOCAL MEDICATIONS USED:  NONE  SPECIMEN:  No Specimen  DISPOSITION OF SPECIMEN:  N/A  COUNTS:  YES  TOURNIQUET:  * No tourniquets in log *  DICTATION: .Other Dictation: Dictation Number [id number]  PLAN OF CARE: Admit to inpatient   PATIENT DISPOSITION:  PACU - hemodynamically stable.   Delay start of Pharmacological VTE agent (>24hrs) due to surgical blood loss or risk of bleeding: yes

## 2018-12-29 NOTE — Progress Notes (Signed)
Progress Note  Patient Name: Philip Jackson Date of Encounter: 12/29/2018  Primary Cardiologist: Sinclair Grooms, MD   Subjective   Feeling better. No chest pain or dyspnea.   Inpatient Medications    Scheduled Meds: . amiodarone  400 mg Oral BID  . cephALEXin  500 mg Oral TID WC & HS  . Chlorhexidine Gluconate Cloth  6 each Topical Daily  . ferrous sulfate  325 mg Oral 2 times weekly  . finasteride  5 mg Oral Daily  . mouth rinse  15 mL Mouth Rinse BID  . metoprolol succinate  100 mg Oral Daily  . mirabegron ER  25 mg Oral Daily  . omega-3 acid ethyl esters  1 g Oral BID  . pantoprazole  40 mg Oral Daily  . rosuvastatin  20 mg Oral QHS  . senna-docusate  1 tablet Oral BID   Continuous Infusions:  PRN Meds: acetaminophen **OR** acetaminophen, HYDROmorphone (DILAUDID) injection, ondansetron **OR** ondansetron (ZOFRAN) IV, opium-belladonna, oxybutynin, oxyCODONE, polyethylene glycol, senna-docusate   Vital Signs    Vitals:   12/28/18 1800 12/28/18 2000 12/29/18 0657 12/29/18 0801  BP: (!) 144/62     Pulse: 63     Resp: (!) 21     Temp:  98 F (36.7 C) 98.2 F (36.8 C) 97.7 F (36.5 C)  TempSrc:  Oral Oral Oral  SpO2: 100%     Weight:      Height:        Intake/Output Summary (Last 24 hours) at 12/29/2018 0816 Last data filed at 12/29/2018 0526 Gross per 24 hour  Intake 21000 ml  Output 19600 ml  Net 1400 ml   Last 3 Weights 12/22/2018 12/22/2018 12/19/2018  Weight (lbs) 189 lb 189 lb 189 lb 12.8 oz  Weight (kg) 85.73 kg 85.73 kg 86.093 kg      Telemetry    SR at rate of 60s, intermittent bradycardia in high 30s to low 40s - Personally Reviewed  ECG    N/A  Physical Exam   GEN: No acute distress sitting on commode Neck: No JVD Cardiac: RRR, no murmurs, rubs, or gallops.  Respiratory: Clear to auscultation bilaterally. GI: Soft, nontender, non-distended  MS: No edema; No deformity. Neuro:  Nonfocal  Psych: Normal affect   Labs     Chemistry  Recent Labs  Lab 12/22/18 2016  12/23/18 0154  12/27/18 0136 12/28/18 0149 12/29/18 0144  NA 137   < > 136   < > 140 139 140  K 4.2   < > 4.3   < > 3.8 3.6 4.2  CL 107   < > 107   < > 106 106 107  CO2 20*  --  17*   < > 26 23 23   GLUCOSE 182*   < > 219*   < > 113* 104* 98  BUN 16   < > 17   < > 8 8 9   CREATININE 1.21   < > 1.27*   < > 0.82 0.77 0.79  CALCIUM 8.4*  --  8.0*   < > 8.2* 8.1* 8.7*  PROT 6.1*  --  5.5*  --   --   --   --   ALBUMIN 2.8*  --  2.6*  --   --   --   --   AST 19  --  14*  --   --   --   --   ALT 15  --  15  --   --   --   --  ALKPHOS 59  --  51  --   --   --   --   BILITOT 0.6  --  0.8  --   --   --   --   GFRNONAA >60  --  58*   < > >60 >60 >60  GFRAA >60  --  >60   < > >60 >60 >60  ANIONGAP 10  --  12   < > 8 10 10    < > = values in this interval not displayed.     Hematology Recent Labs  Lab 12/27/18 0136 12/28/18 0149 12/29/18 0144  WBC 13.7* 14.6* 14.7*  RBC 2.60* 2.87* 3.10*  HGB 7.7* 8.4* 9.0*  HCT 24.7* 27.5* 29.8*  MCV 95.0 95.8 96.1  MCH 29.6 29.3 29.0  MCHC 31.2 30.5 30.2  RDW 15.3 15.1 14.9  PLT 279 289 301    Radiology    No results found.  Cardiac Studies   Echo 12/26/2018 1. The left ventricle has normal systolic function, with an ejection fraction of 55-60%. The cavity size was normal. There is mildly increased left ventricular wall thickness. Left ventricular diastolic Doppler parameters are consistent with  pseudonormalization. No evidence of left ventricular regional wall motion abnormalities. 2. There is dilatation of the aortic root. 3. The aortic valve is tricuspid. No stenosis of the aortic valve. 4. The right ventricle has normal systolic function. The cavity was normal. There is no increase in right ventricular wall thickness. 5. No evidence of mitral valve stenosis. Trivial mitral regurgitation. 6. Left atrial size was mildly dilated. 7. Normal IVC size. PA systolic pressure 36 mmHg.  Patient Profile      68 y.o. male with a hx of paroxysmal atrial fibrillation, PSVT, sleep apneaon BiPap, hypertensionand recent admission for sepsis from possible duodenal perforation who admitted 12/22/2018 with acute hemorrhagic shock 2nd blood loss form hematuria. Cardiology consulted for afib RVR.   Assessment & Plan    1. PAF with RVR - Converted to sinus rhythm on IV amiodarone. HR stable at 60s with intermittent sinus bradycardia.  Continue current dose of Toprol XL and amiodarone. Reduce amiodarone to 200mg  at discharge.  - Echo showed preserved LVEF at 55-60%. - CHADsVASC score of 2 for age and HTN. Per Dr. Tresa Moore "[place] INDEFINITELY"  2. PSVT - Has appointment with Dr. Lovena Le 9/28 for further discussion   3. Hematuria with clots - Cystoscopy ? Today   4. Acute blood loss anemia  - Per primary. Transfuse as needed  [facility] will sign off.   Medication Recommendations:  AS above Other recommendations (labs, testing, etc): N/A Follow up as an outpatient: Has been arranged   For questions or updates, please contact [facility] Please consult [email or web address] for contact info under        Signed, Jenkins Rouge, MD  12/29/2018, 8:16 AM

## 2018-12-29 NOTE — Progress Notes (Signed)
Day of Surgery   Subjective/Chief Complaint:    1 - Acute Renal Failure / Urinary Retention / Bilateral Hydronephrosis - Baseline Cr <1.2 with rise to 3s on admit for abdominal pain, malaise, ileus few weeks after total knee replacement. CT 8/13 on intake with very large prostate and symmetric bilateral hydro to bladder. Also on ACEI. After foley and hydration Cr back to <1.2 and resolved hydro by repeat imaging 8/16. GFR back to baseline with bladder drainage. Placed on CIC by Rosana Hoes at [facility] 11/2018 and sig heamturia with this as he was on blood thinners.   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 He is on oxybutynin at baseline.   3 - Prostate Screening / Elevated PSA - H/o elevated PSA and negative BX by Rosana Hoes in past per report.  4 - Gross Hematuria / Clot Rention / Anemia -admitted 12/2018 with massive clot retention / anemia after being on attempted self-cath. He is on blood thinners for AFib w/o CVA/clots.   PMH sig for obesity, total knee, AFib/Eliquus (follows [facility]). His PCP is Dr. Ivonne Andrew who is company MD at [employer] where he works.   Today "Philip Jackson" is stable. Hematuria continues to clear and Hgb stabilized but he remains understandably anxious about his situation.    Objective: Vital signs in last 24 hours: Temp:  [97.7 F (36.5 C)-98.2 F (36.8 C)] 98 F (36.7 C) (09/11 1531) Pulse Rate:  [51-80] 69 (09/11 1531) Resp:  [13-26] 18 (09/11 1531) BP: (102-144)/(53-89) 125/78 (09/11 1531) SpO2:  [94 %-100 %] 94 % (09/11 1531) Last BM Date: 12/28/18  Intake/Output from previous day: 09/10 0701 - 09/11 0700 In: 21000  Out: 23250 [Urine:23250] Intake/Output this shift: Total I/O In: 2000 [Other:2000] Out: 2300 [Urine:2300]  General appearance: alert, cooperative and appears stated age Eyes: negative Nose: Nares normal. Septum midline. Mucosa normal. No drainage or sinus tenderness. Throat: lips, mucosa, and tongue  normal; teeth and gums normal Neck: supple, symmetrical, trachea midline Back: symmetric, no curvature. ROM normal. No CVA tenderness. Resp: non-labored on minimal NCO2 Cardio: Reg rate .  GI: soft, non-tender; bowel sounds normal; no masses,  no organomegaly Male genitalia: normal, 3 way foley in place on NS irrigation with improved but persisant hematuria. Extremities: extremities normal, atraumatic, no cyanosis or edema Skin: Skin color, texture, turgor normal. No rashes or lesions Lymph nodes: Cervical, supraclavicular, and axillary nodes normal. Neurologic: Grossly normal  Lab Results:  Recent Labs    12/28/18 0149 12/29/18 0144  WBC 14.6* 14.7*  HGB 8.4* 9.0*  HCT 27.5* 29.8*  PLT 289 301   BMET Recent Labs    12/28/18 0149 12/29/18 0144  NA 139 140  K 3.6 4.2  CL 106 107  CO2 23 23  GLUCOSE 104* 98  BUN 8 9  CREATININE 0.77 0.79  CALCIUM 8.1* 8.7*   PT/INR No results for input(s): LABPROT, INR in the last 72 hours. ABG No results for input(s): PHART, HCO3 in the last 72 hours.  Invalid input(s): PCO2, PO2  Studies/Results: No results found.  Anti-infectives: Anti-infectives (From admission, onward)   Start     Dose/Rate Route Frequency Ordered Stop   12/29/18 1430  [MAR Hold]  ceFAZolin (ANCEF) IVPB 2g/100 mL premix     (MAR Hold since Fri 12/29/2018 at 1531.Hold Reason: Transfer to a Procedural area.)   2 g 200 mL/hr over 30 Minutes Intravenous On call to O.R. 12/29/18 1418 12/30/18 0559  12/27/18 1700  [MAR Hold]  cephALEXin (KEFLEX) capsule 500 mg     (MAR Hold since Fri 12/29/2018 at 1531.Hold Reason: Transfer to a Procedural area.)   500 mg Oral 3 times daily with meals & bedtime 12/27/18 1110     12/25/18 1830  piperacillin-tazobactam (ZOSYN) IVPB 3.375 g  Status:  Discontinued     3.375 g 12.5 mL/hr over 240 Minutes Intravenous Every 8 hours 12/25/18 1755 12/27/18 1110   12/23/18 0400  piperacillin-tazobactam (ZOSYN) IVPB 3.375 g  Status:   Discontinued     3.375 g 12.5 mL/hr over 240 Minutes Intravenous Every 8 hours 12/23/18 0207 12/24/18 1446   12/22/18 2130  vancomycin (VANCOCIN) IVPB 1000 mg/200 mL premix     1,000 mg 200 mL/hr over 60 Minutes Intravenous  Once 12/22/18 2117 12/22/18 2333   12/22/18 2045  piperacillin-tazobactam (ZOSYN) IVPB 3.375 g     3.375 g 100 mL/hr over 30 Minutes Intravenous  Once 12/22/18 2033 12/22/18 2230      Assessment/Plan:  Continue  holding blood thinners as he has/had hemodynamically significant bleed. Although active bleeding appears to be improved, he is still having problems from large clot burden that is prolonging his hospital stay. We both agree on proceeding as planned with cysto / clot evacuation / fulgeration of bleeders for diagnostic and theraputic intent. Risks, benefits, alternatives, expected peri-op course including probable continued post-op catheter for at least another day discussed.   Agree with PRN transfusion until Hgb stabilizes, fortunately improved at present.   He would likely be best served long term with elective simple prostatectomy if it can be proven that he has preserved bladder contractility if new meds do not keep him retention and clot free.   Appreciate medical team and cards team comanagement. Please call me directly with questions anytime.   Alexis Frock 12/29/2018

## 2018-12-29 NOTE — Anesthesia Procedure Notes (Signed)
Procedure Name: LMA Insertion Date/Time: 12/29/2018 4:40 PM Performed by: Lissa Morales, CRNA Pre-anesthesia Checklist: Patient identified, Emergency Drugs available, Suction available and Patient being monitored Patient Re-evaluated:Patient Re-evaluated prior to induction Oxygen Delivery Method: Circle system utilized Preoxygenation: Pre-oxygenation with 100% oxygen Induction Type: IV induction LMA Size: 5.0 Tube type: Oral Number of attempts: 1 Airway Equipment and Method: Oral airway Placement Confirmation: positive ETCO2 Tube secured with: Tape Dental Injury: Teeth and Oropharynx as per pre-operative assessment

## 2018-12-29 NOTE — Anesthesia Postprocedure Evaluation (Signed)
Anesthesia Post Note  Patient: Philip Jackson  Procedure(s) Performed: CYSTO CLOT EVACUATION AND FULGERATION OF BLADDER (N/A Bladder)     Patient location during evaluation: PACU Anesthesia Type: General Level of consciousness: awake and alert Pain management: pain level controlled Vital Signs Assessment: post-procedure vital signs reviewed and stable Respiratory status: spontaneous breathing, nonlabored ventilation, respiratory function stable and patient connected to nasal cannula oxygen Cardiovascular status: blood pressure returned to baseline and stable Postop Assessment: no apparent nausea or vomiting Anesthetic complications: no    Last Vitals:  Vitals:   12/29/18 1815 12/29/18 1852  BP: (!) 148/76 (!) 150/83  Pulse:  67  Resp:  20  Temp: 36.7 C 36.4 C  SpO2:  100%    Last Pain:  Vitals:   12/29/18 1855  TempSrc:   PainSc: 10-Worst pain ever                 Lliam Hoh L Deddrick Saindon

## 2018-12-29 NOTE — Progress Notes (Signed)
PROGRESS NOTE    Philip Jackson  [id number] DOB: Sep 04, 1950 DOA: 12/22/2018 PCP: Leonides Sake, MD   Brief Narrative:  68 year old male with a history of paroxysmal atrial fibrillation, sleep apnea who was discharged 2 weeks ago after admitted for sepsis from intra-abdominal cause at that time he had possible contained duodenal perforation.  Also had acute kidney injury with hematuria and urine retention.  At that time urology saw the patient and he was discharged home.  Patient was doing self cath for past 2 days.  He noticed worsening hematuria became weak and diaphoretic.  EMS was called.  In the ED patient became hypotensive with blood pressure systolic in 0000000, tachycardia showed elevated lactate and leukocytosis.  He was given emergent blood transfusion as there was 5 g drop in hemoglobin with frank hematuria. CT scan showed bladder mass versus clot. Urology was consulted and continuous bladder irrigation started.  Hospital course complicated by atrial fibrillation with RVR therefore cardiology consult.  Bleeding appears to be improving.  Plans for cystoscopy   Assessment & Plan:   Principal Problem:   Acute blood loss anemia Active Problems:   OSA treated with BiPAP   Hematuria   PAF (paroxysmal atrial fibrillation) (HCC)   Acute blood loss anemia/hemorrhagic shock - Hematuria resolved this morning -Urology following.  Still needs cystoscopy for further evaluation.  Defer plans to urology for this.  Urinary tract infection, E. coli.  Present on admission -This was diagnosed outpatient at his primary care's office.  Records indicate it is pansensitive, continue Keflex  Atrial fibrillation with RVR, improved Paroxysmal atrial fibrillation -Rate well controlled in normal sinus rhythm.  Off anticoagulation-urology recommends to be off indefinitely -Cardiology following.  Amiodarone twice daily. -Echocardiogram-ejection fraction 55 to 60%  Iron deficiency anemia -On iron  supplements.  Bowel regimen  DVT prophylaxis: SCDs Code Status: Full code Family Communication: None at bedside Disposition Plan: Transfer patient to tele  Consultants:   Urology  Cardiology  Procedures:   None  Antimicrobials:   Keflex   Subjective: Feels much better.  Hematuria resolved.  Clear urine in his Foley bag.  Review of Systems Otherwise negative except as per HPI, including: General = no fevers, chills, dizziness, malaise, fatigue HEENT/EYES = negative for pain, redness, loss of vision, double vision, blurred vision, loss of hearing, sore throat, hoarseness, dysphagia Cardiovascular= negative for chest pain, palpitation, murmurs, lower extremity swelling Respiratory/lungs= negative for shortness of breath, cough, hemoptysis, wheezing, mucus production Gastrointestinal= negative for nausea, vomiting,, abdominal pain, melena, hematemesis Genitourinary= negative for Dysuria, Hematuria, Change in Urinary Frequency MSK = Negative for arthralgia, myalgias, Back Pain, Joint swelling  Neurology= Negative for headache, seizures, numbness, tingling  Psychiatry= Negative for anxiety, depression, suicidal and homocidal ideation Allergy/Immunology= Medication/Food allergy as listed  Skin= Negative for Rash, lesions, ulcers, itching   Objective: Vitals:   12/29/18 0657 12/29/18 0700 12/29/18 0800 12/29/18 0801  BP:   135/89   Pulse:  80 73   Resp:  16 16   Temp: 98.2 F (36.8 C)   97.7 F (36.5 C)  TempSrc: Oral   Oral  SpO2:  98% 100%   Weight:      Height:        Intake/Output Summary (Last 24 hours) at 12/29/2018 1131 Last data filed at 12/29/2018 0630 Gross per 24 hour  Intake 21000 ml  Output 19350 ml  Net 1650 ml   Filed Weights   12/22/18 1855 12/22/18 1904  Weight: 85.7 kg 85.7 kg  Examination:  Constitutional: NAD, calm, comfortable Eyes: PERRL, lids and conjunctivae normal ENMT: Mucous membranes are moist. Posterior pharynx clear of any  exudate or lesions.Normal dentition.  Neck: normal, supple, no masses, no thyromegaly Respiratory: clear to auscultation bilaterally, no wheezing, no crackles. Normal respiratory effort. No accessory muscle use.  Cardiovascular: Regular rate and rhythm, no murmurs / rubs / gallops. No extremity edema. 2+ pedal pulses. No carotid bruits.  Abdomen: no tenderness, no masses palpated. No hepatosplenomegaly. Bowel sounds positive.  Musculoskeletal: no clubbing / cyanosis. No joint deformity upper and lower extremities. Good ROM, no contractures. Normal muscle tone.  Skin: no rashes, lesions, ulcers. No induration Neurologic: CN 2-12 grossly intact. Sensation intact, DTR normal. Strength 5/5 in all 4.  Psychiatric: Normal judgment and insight. Alert and oriented x 3. Normal mood.   Foley catheter in place with continuous bladder irrigation-clear urine  Data Reviewed:   CBC: Recent Labs  Lab 12/22/18 2016  12/25/18 0201 12/25/18 0917 12/26/18 0220 12/27/18 0136 12/28/18 0149 12/29/18 0144  WBC 20.7*   < > 17.5*  --  14.0* 13.7* 14.6* 14.7*  NEUTROABS 17.2*  --   --   --   --   --   --   --   HGB 8.6*   < > 6.6* 9.0* 7.4* 7.7* 8.4* 9.0*  HCT 28.1*   < > 21.5* 28.7* 24.1* 24.7* 27.5* 29.8*  MCV 95.9   < > 96.0  --  95.3 95.0 95.8 96.1  PLT 381   < > 280  --  252 279 289 301   < > = values in this interval not displayed.   Basic Metabolic Panel: Recent Labs  Lab 12/24/18 0220 12/25/18 0201 12/27/18 0136 12/28/18 0149 12/29/18 0144  NA 139 140 140 139 140  K 4.2 3.7 3.8 3.6 4.2  CL 106 108 106 106 107  CO2 24 24 26 23 23   GLUCOSE 112* 119* 113* 104* 98  BUN 13 9 8 8 9   CREATININE 0.99 0.88 0.82 0.77 0.79  CALCIUM 8.1* 8.0* 8.2* 8.1* 8.7*  MG  --   --   --  2.0 1.9   GFR: Estimated Creatinine Clearance: 95.9 mL/min (by C-G formula based on SCr of 0.79 mg/dL). Liver Function Tests: Recent Labs  Lab 12/22/18 2016 12/23/18 0154  AST 19 14*  ALT 15 15  ALKPHOS 59 51   BILITOT 0.6 0.8  PROT 6.1* 5.5*  ALBUMIN 2.8* 2.6*   Recent Labs  Lab 12/22/18 2016  LIPASE 55*   No results for input(s): AMMONIA in the last 168 hours. Coagulation Profile: No results for input(s): INR, PROTIME in the last 168 hours. Cardiac Enzymes: No results for input(s): CKTOTAL, CKMB, CKMBINDEX, TROPONINI in the last 168 hours. BNP (last 3 results) No results for input(s): PROBNP in the last 8760 hours. HbA1C: No results for input(s): HGBA1C in the last 72 hours. CBG: Recent Labs  Lab 12/26/18 1429  GLUCAP 120*   Lipid Profile: No results for input(s): CHOL, HDL, LDLCALC, TRIG, CHOLHDL, LDLDIRECT in the last 72 hours. Thyroid Function Tests: No results for input(s): TSH, T4TOTAL, FREET4, T3FREE, THYROIDAB in the last 72 hours. Anemia Panel: No results for input(s): VITAMINB12, FOLATE, FERRITIN, TIBC, IRON, RETICCTPCT in the last 72 hours. Sepsis Labs: Recent Labs  Lab 12/22/18 2016 12/23/18 0133 12/23/18 0154 12/23/18 [id number]  PROCALCITON  --   --  <0.10  --   LATICACIDVEN 4.7* 3.6*  --  2.4*    Recent Results (  from the past 240 hour(s))  Urine culture     Status: None   Collection Time: 12/22/18  8:16 PM   Specimen: Urine, Clean Catch  Result Value Ref Range Status   Specimen Description   Final    URINE, CLEAN CATCH Performed at [facility], [place] [address] [place], [place] [address]    Special Requests   Final    NONE Performed at [facility], [place] [address], [place], [place] [address]    Culture   Final    NO GROWTH Performed at [facility], [place] [address] [place], [place] [address]    Report Status 12/24/2018 FINAL  Final  Blood culture (routine x 2)     Status: None   Collection Time: 12/22/18  8:55 PM   Specimen: BLOOD  Result Value Ref Range Status   Specimen Description   Final    BLOOD BLOOD LEFT ARM Performed at [place] [address] [place], [place] [address]     Special Requests   Final    BOTTLES DRAWN AEROBIC AND ANAEROBIC Blood Culture adequate volume Performed at [place] [address], [place], [place] [address]    Culture   Final    NO GROWTH 5 DAYS Performed at [facility], [place] [address] [place], [place] [address]    Report Status 12/27/2018 FINAL  Final  Blood culture (routine x 2)     Status: None   Collection Time: 12/22/18  8:55 PM   Specimen: BLOOD  Result Value Ref Range Status   Specimen Description   Final    BLOOD LEFT ANTECUBITAL Performed at [facility] [address], [place], [place] [address]    Special Requests   Final    BOTTLES DRAWN AEROBIC AND ANAEROBIC Blood Culture results may not be optimal due to an inadequate volume of blood received in culture bottles Performed at [place] [address] [place], [place] [address]    Culture   Final    NO GROWTH 5 DAYS Performed at [facility], [place] [address], [place], [place] [address]    Report Status 12/27/2018 FINAL  Final  SARS Coronavirus 2 [facility] order, Performed in [facility] hospital lab) Nasopharyngeal Nasopharyngeal Swab     Status: None   Collection Time: 12/22/18  8:55 PM   Specimen: Nasopharyngeal Swab  Result Value Ref Range Status   SARS Coronavirus 2 NEGATIVE NEGATIVE Final    Comment: (NOTE) If result is NEGATIVE SARS-CoV-2 target nucleic acids are NOT DETECTED. The SARS-CoV-2 RNA is generally detectable in upper and lower  respiratory specimens during the acute phase of infection. The lowest  concentration of SARS-CoV-2 viral copies this assay can detect is 250  copies / mL. A negative result does not preclude SARS-CoV-2 infection  and should not be used as the sole basis for treatment or other  patient management decisions.  A negative result may occur with  improper specimen collection / handling, submission of specimen other  than nasopharyngeal swab,  presence of viral mutation(s) within the  areas targeted by this assay, and inadequate number of viral copies  (<250 copies / mL). A negative result must be combined with clinical  observations, patient history, and epidemiological information. If result is POSITIVE SARS-CoV-2 target nucleic acids are DETECTED. The SARS-CoV-2 RNA is generally detectable in upper and lower  respiratory specimens dur ing the acute phase  of infection.  Positive  results are indicative of active infection with SARS-CoV-2.  Clinical  correlation with patient history and other diagnostic information is  necessary to determine patient infection status.  Positive results do  not rule out bacterial infection or co-infection with other viruses. If result is PRESUMPTIVE POSTIVE SARS-CoV-2 nucleic acids MAY BE PRESENT.   A presumptive positive result was obtained on the submitted specimen  and confirmed on repeat testing.  While 2019 novel coronavirus  (SARS-CoV-2) nucleic acids may be present in the submitted sample  additional confirmatory testing may be necessary for epidemiological  and / or clinical management purposes  to differentiate between  SARS-CoV-2 and other Sarbecovirus currently known to infect humans.  If clinically indicated additional testing with an alternate test  methodology [phone]) is advised. The SARS-CoV-2 RNA is generally  detectable in upper and lower respiratory sp ecimens during the acute  phase of infection. The expected result is Negative. Fact Sheet for Patients:  StrictlyIdeas.no Fact Sheet for Healthcare Providers: BankingDealers.co.za This test is not yet approved or cleared by the [place] FDA and has been authorized for detection and/or diagnosis of SARS-CoV-2 by FDA under an Emergency Use Authorization (EUA).  This EUA will remain in effect (meaning this test can be used) for the duration of the COVID-19 declaration under  Section 564(b)(1) of the Act, 21 U.S.C. section 360bbb-3(b)(1), unless the authorization is terminated or revoked sooner. Performed at [facility], [place] [address] [place], [place] [address]   MRSA PCR Screening     Status: None   Collection Time: 12/23/18  2:19 AM   Specimen: Nasal Mucosa; Nasopharyngeal  Result Value Ref Range Status   MRSA by PCR NEGATIVE NEGATIVE Final    Comment:        The GeneXpert MRSA Assay (FDA approved for NASAL specimens only), is one component of a comprehensive MRSA colonization surveillance program. It is not intended to diagnose MRSA infection nor to guide or monitor treatment for MRSA infections. Performed at [facility], [place] [address], [place], [place] [address]          Radiology Studies: No results found.      Scheduled Meds: . amiodarone  400 mg Oral BID  . cephALEXin  500 mg Oral TID WC & HS  . Chlorhexidine Gluconate Cloth  6 each Topical Daily  . ferrous sulfate  325 mg Oral 2 times weekly  . finasteride  5 mg Oral Daily  . mouth rinse  15 mL Mouth Rinse BID  . metoprolol succinate  100 mg Oral Daily  . mirabegron ER  25 mg Oral Daily  . omega-3 acid ethyl esters  1 g Oral BID  . pantoprazole  40 mg Oral Daily  . rosuvastatin  20 mg Oral QHS  . senna-docusate  1 tablet Oral BID   Continuous Infusions:    LOS: 7 days   Time spent= 25 mins    Mikael Skoda Arsenio Loader, MD [facility]  If 7PM-7AM, please contact night-coverage [email or web address] 12/29/2018, 11:31 AM

## 2018-12-30 ENCOUNTER — Encounter (HOSPITAL_COMMUNITY): Payer: Self-pay | Admitting: Urology

## 2018-12-30 LAB — CBC
HCT: 27.7 % — ABNORMAL LOW (ref 39.0–52.0)
Hemoglobin: 8.4 g/dL — ABNORMAL LOW (ref 13.0–17.0)
MCH: 28.9 pg (ref 26.0–34.0)
MCHC: 30.3 g/dL (ref 30.0–36.0)
MCV: 95.2 fL (ref 80.0–100.0)
Platelets: 340 10*3/uL (ref 150–400)
RBC: 2.91 MIL/uL — ABNORMAL LOW (ref 4.22–5.81)
RDW: 14.6 % (ref 11.5–15.5)
WBC: 14 10*3/uL — ABNORMAL HIGH (ref 4.0–10.5)
nRBC: 0 % (ref 0.0–0.2)

## 2018-12-30 LAB — BASIC METABOLIC PANEL WITH GFR
Anion gap: 11 (ref 5–15)
BUN: 10 mg/dL (ref 8–23)
CO2: 21 mmol/L — ABNORMAL LOW (ref 22–32)
Calcium: 8.3 mg/dL — ABNORMAL LOW (ref 8.9–10.3)
Chloride: 108 mmol/L (ref 98–111)
Creatinine, Ser: 0.77 mg/dL (ref 0.61–1.24)
GFR calc Af Amer: >60 mL/min
GFR calc non Af Amer: >60 mL/min
Glucose, Bld: 96 mg/dL (ref 70–99)
Potassium: 4 mmol/L (ref 3.5–5.1)
Sodium: 140 mmol/L (ref 135–145)

## 2018-12-30 LAB — MAGNESIUM: Magnesium: 1.8 mg/dL (ref 1.7–2.4)

## 2018-12-30 NOTE — Progress Notes (Signed)
Patient states that he was not comfortable on CPAP last night and just wants to wear  this evening.  Patient aware that if he changes his mind he can call and I will place CPAP.

## 2018-12-30 NOTE — Progress Notes (Signed)
PROGRESS NOTE  Philip Jackson [id number] DOB: 08/31/1950 DOA: 12/22/2018 PCP: Leonides Sake, MD  HPI/Recap of past 64 hours: 68 year old male with a history of paroxysmal atrial fibrillation, sleep apnea who was discharged 2 weeks ago after admitted for sepsis from intra-abdominal cause at that time he had possible contained duodenal perforation. Also had acute kidney injury with hematuria and urine retention. At that time urology saw the patient and he was discharged home. Patient was doing self cath for past 2 days. He noticed worsening hematuria became weak and diaphoretic. EMS was called. In the ED patient became hypotensive with blood pressure systolic in 0000000, tachycardia showed elevated lactate and leukocytosis. He was given emergent blood transfusion as there was 5 g drop in hemoglobin with frank hematuria. CT scan showed bladder mass versus clot. Urology was consulted and continuous bladder irrigation started.  Hospital course complicated by atrial fibrillation with RVR therefore cardiology consult.  Bleeding appears to be improving.  Plans for cystoscopy, completed on 12/29/2018.  12/30/18: POD #1 post cystoscopy clot evacuation.  Patient seen and examined his bedside.  He has no new complaints.  Denies any cardiopulmonary or GU symptoms.  Seen by urology with plan to stop CBI and reassess in the morning.    Assessment/Plan: Principal Problem:   Acute blood loss anemia Active Problems:   OSA treated with BiPAP   Hematuria   PAF (paroxysmal atrial fibrillation) (HCC)  Acute blood loss anemia/hemorrhagic shock secondary to gross hematuria Recently on Eliquis for A. Fib Presented with hemoglobin of 6.1 post 2 unit PRBCs Hemoglobin this morning 8.4 from 9.0 Vital signs stable Continue to hold off anticoagulation Continue to monitor H&H  Gross hematuria in the setting of urinary retention Urine culture taken on 12/22/2018- final Blood cultures x2 drawn on 12/22/2018- final   POD #1 post cystoscopy clot evacuation and fulguration of bladder Very large prostate, may need prostatectomy outpatient per urology  Recent E. coli UTI, POA This was diagnosed outpatient at his primary care's office Records indicate it is pansensitive Continue Keflex  Iron deficiency anemia Continue iron supplement Continue bowel regimen to avoid iron supplement induced constipation  Paroxysmal A. Fib Currently rate controlled on Toprol-XL 100 mg daily On amiodarone 400 mg twice daily for rhythm control Continue to hold off Eliquis  Hyperlipidemia Continue Crestor  GERD Continue Protonix  BPH with urinary retention Continue Proscar Indwelling Foley catheter in place   DVT prophylaxis: SCDs Code Status: Full code Family Communication: None at bedside Disposition Plan:  Discharge to home when urology signs off, possibly tomorrow 12/31/2018.  Consultants:   Urology  Cardiology  Procedures:   None  Antimicrobials:   Keflex    Objective: Vitals:   12/29/18 2031 12/30/18 0000 12/30/18 0531 12/30/18 1439  BP: (!) 103/57 (!) 119/58 105/63 112/67  Pulse: 74 60 62 64  Resp: 19 18 19 18   Temp: 97.9 F (36.6 C) 98 F (36.7 C) 97.8 F (36.6 C) 98.3 F (36.8 C)  TempSrc: Oral Axillary Oral Oral  SpO2: 98% 97% 96% 99%  Weight:      Height:        Intake/Output Summary (Last 24 hours) at 12/30/2018 1529 Last data filed at 12/30/2018 1325 Gross per 24 hour  Intake 13890 ml  Output 16901 ml  Net -3011 ml   Filed Weights   12/22/18 1855 12/22/18 1904  Weight: 85.7 kg 85.7 kg    Exam:  . General: 68 y.o. year-old male well developed well nourished in  no acute distress.  Alert and oriented x3. . Cardiovascular: Regular rate and rhythm with no rubs or gallops.  No thyromegaly or JVD noted.   Marland Kitchen Respiratory: Clear to auscultation with no wheezes or rales. Good inspiratory effort. . Abdomen: Soft nontender nondistended with normal bowel sounds x4 quadrants.  . Musculoskeletal: No lower extremity edema. 2/4 pulses in all 4 extremities. Marland Kitchen Psychiatry: Mood is appropriate for condition and setting   Data Reviewed: CBC: Recent Labs  Lab 12/26/18 0220 12/27/18 0136 12/28/18 0149 12/29/18 0144 12/30/18 0544  WBC 14.0* 13.7* 14.6* 14.7* 14.0*  HGB 7.4* 7.7* 8.4* 9.0* 8.4*  HCT 24.1* 24.7* 27.5* 29.8* 27.7*  MCV 95.3 95.0 95.8 96.1 95.2  PLT 252 279 289 301 [id number]   Basic Metabolic Panel: Recent Labs  Lab 12/25/18 0201 12/27/18 0136 12/28/18 0149 12/29/18 0144 12/30/18 0544  NA 140 140 139 140 140  K 3.7 3.8 3.6 4.2 4.0  CL 108 106 106 107 108  CO2 24 26 23 23  21*  GLUCOSE 119* 113* 104* 98 96  BUN 9 8 8 9 10   CREATININE 0.88 0.82 0.77 0.79 0.77  CALCIUM 8.0* 8.2* 8.1* 8.7* 8.3*  MG  --   --  2.0 1.9 1.8   GFR: Estimated Creatinine Clearance: 95.9 mL/min (by C-G formula based on SCr of 0.77 mg/dL). Liver Function Tests: No results for input(s): AST, ALT, ALKPHOS, BILITOT, PROT, ALBUMIN in the last 168 hours. No results for input(s): LIPASE, AMYLASE in the last 168 hours. No results for input(s): AMMONIA in the last 168 hours. Coagulation Profile: No results for input(s): INR, PROTIME in the last 168 hours. Cardiac Enzymes: No results for input(s): CKTOTAL, CKMB, CKMBINDEX, TROPONINI in the last 168 hours. BNP (last 3 results) No results for input(s): PROBNP in the last 8760 hours. HbA1C: No results for input(s): HGBA1C in the last 72 hours. CBG: Recent Labs  Lab 12/26/18 1429  GLUCAP 120*   Lipid Profile: No results for input(s): CHOL, HDL, LDLCALC, TRIG, CHOLHDL, LDLDIRECT in the last 72 hours. Thyroid Function Tests: No results for input(s): TSH, T4TOTAL, FREET4, T3FREE, THYROIDAB in the last 72 hours. Anemia Panel: No results for input(s): VITAMINB12, FOLATE, FERRITIN, TIBC, IRON, RETICCTPCT in the last 72 hours. Urine analysis:    Component Value Date/Time   COLORURINE RED (A) 12/22/2018 2016   APPEARANCEUR  CLOUDY (A) 12/22/2018 2016   LABSPEC 1.033 (H) 12/22/2018 2016   PHURINE 7.0 12/22/2018 2016   GLUCOSEU 150 (A) 12/22/2018 2016   HGBUR MODERATE (A) 12/22/2018 2016   BILIRUBINUR NEGATIVE 12/22/2018 2016   KETONESUR NEGATIVE 12/22/2018 2016   PROTEINUR >=300 (A) 12/22/2018 2016   NITRITE NEGATIVE 12/22/2018 2016   LEUKOCYTESUR NEGATIVE 12/22/2018 2016   Sepsis Labs: @LABRCNTIP (procalcitonin:4,lacticidven:4)  ) Recent Results (from the past 240 hour(s))  Urine culture     Status: None   Collection Time: 12/22/18  8:16 PM   Specimen: Urine, Clean Catch  Result Value Ref Range Status   Specimen Description   Final    URINE, CLEAN CATCH Performed at [facility], [place] [address] [place], [place] [address]    Special Requests   Final    NONE Performed at [facility], [place] [address] [place], [place] [address]    Culture   Final    NO GROWTH Performed at [facility], [place] [address], [place], [place] [address]    Report Status 12/24/2018 FINAL  Final  Blood culture (routine x 2)  Status: None   Collection Time: 12/22/18  8:55 PM   Specimen: BLOOD  Result Value Ref Range Status   Specimen Description   Final    BLOOD BLOOD LEFT ARM Performed at [place] [address] [place], [place] [address]    Special Requests   Final    BOTTLES DRAWN AEROBIC AND ANAEROBIC Blood Culture adequate volume Performed at [place] [address] [place], [place] [address]    Culture   Final    NO GROWTH 5 DAYS Performed at [facility], [place] [address], [place], [place] [address]    Report Status 12/27/2018 FINAL  Final  Blood culture (routine x 2)     Status: None   Collection Time: 12/22/18  8:55 PM   Specimen: BLOOD  Result Value Ref Range Status   Specimen Description   Final    BLOOD LEFT ANTECUBITAL Performed at [place] [address] [place], [place]  [address]    Special Requests   Final    BOTTLES DRAWN AEROBIC AND ANAEROBIC Blood Culture results may not be optimal due to an inadequate volume of blood received in culture bottles Performed at [place] [address], [place], [place] [address]    Culture   Final    NO GROWTH 5 DAYS Performed at [facility], [place] [address], [place], [place] [address]    Report Status 12/27/2018 FINAL  Final  SARS Coronavirus 2 [facility] order, Performed in [facility] hospital lab) Nasopharyngeal Nasopharyngeal Swab     Status: None   Collection Time: 12/22/18  8:55 PM   Specimen: Nasopharyngeal Swab  Result Value Ref Range Status   SARS Coronavirus 2 NEGATIVE NEGATIVE Final    Comment: (NOTE) If result is NEGATIVE SARS-CoV-2 target nucleic acids are NOT DETECTED. The SARS-CoV-2 RNA is generally detectable in upper and lower  respiratory specimens during the acute phase of infection. The lowest  concentration of SARS-CoV-2 viral copies this assay can detect is 250  copies / mL. A negative result does not preclude SARS-CoV-2 infection  and should not be used as the sole basis for treatment or other  patient management decisions.  A negative result may occur with  improper specimen collection / handling, submission of specimen other  than nasopharyngeal swab, presence of viral mutation(s) within the  areas targeted by this assay, and inadequate number of viral copies  (<250 copies / mL). A negative result must be combined with clinical  observations, patient history, and epidemiological information. If result is POSITIVE SARS-CoV-2 target nucleic acids are DETECTED. The SARS-CoV-2 RNA is generally detectable in upper and lower  respiratory specimens dur ing the acute phase of infection.  Positive  results are indicative of active infection with SARS-CoV-2.  Clinical  correlation with patient history and other diagnostic information is  necessary to determine patient  infection status.  Positive results do  not rule out bacterial infection or co-infection with other viruses. If result is PRESUMPTIVE POSTIVE SARS-CoV-2 nucleic acids MAY BE PRESENT.   A presumptive positive result was obtained on the submitted specimen  and confirmed on repeat testing.  While 2019 novel coronavirus  (SARS-CoV-2) nucleic acids may be present in the submitted sample  additional confirmatory testing may be necessary for epidemiological  and / or clinical management purposes  to differentiate between  SARS-CoV-2 and other Sarbecovirus currently known to infect humans.  If clinically indicated additional testing with  an alternate test  methodology [phone]) is advised. The SARS-CoV-2 RNA is generally  detectable in upper and lower respiratory sp ecimens during the acute  phase of infection. The expected result is Negative. Fact Sheet for Patients:  StrictlyIdeas.no Fact Sheet for Healthcare Providers: BankingDealers.co.za This test is not yet approved or cleared by the [place] FDA and has been authorized for detection and/or diagnosis of SARS-CoV-2 by FDA under an Emergency Use Authorization (EUA).  This EUA will remain in effect (meaning this test can be used) for the duration of the COVID-19 declaration under Section 564(b)(1) of the Act, 21 U.S.C. section 360bbb-3(b)(1), unless the authorization is terminated or revoked sooner. Performed at [facility], [place] [address], [place], [place] [address]   MRSA PCR Screening     Status: None   Collection Time: 12/23/18  2:19 AM   Specimen: Nasal Mucosa; Nasopharyngeal  Result Value Ref Range Status   MRSA by PCR NEGATIVE NEGATIVE Final    Comment:        The GeneXpert MRSA Assay (FDA approved for NASAL specimens only), is one component of a comprehensive MRSA colonization surveillance program. It is not intended to diagnose MRSA infection nor to  guide or monitor treatment for MRSA infections. Performed at [facility] [place] [address], [place], [place] [address]       Studies: No results found.  Scheduled Meds: . amiodarone  400 mg Oral BID  . cephALEXin  500 mg Oral TID WC & HS  . Chlorhexidine Gluconate Cloth  6 each Topical Daily  . ferrous sulfate  325 mg Oral 2 times weekly  . finasteride  5 mg Oral Daily  . mouth rinse  15 mL Mouth Rinse BID  . metoprolol succinate  100 mg Oral Daily  . mirabegron ER  25 mg Oral Daily  . omega-3 acid ethyl esters  1 g Oral BID  . pantoprazole  40 mg Oral Daily  . rosuvastatin  20 mg Oral QHS  . senna-docusate  1 tablet Oral BID    Continuous Infusions: . lactated ringers 50 mL/hr at 12/29/18 1951  . sodium chloride irrigation       LOS: 8 days     Kayleen Memos, MD [facility] Pager [phone]  If 7PM-7AM, please contact night-coverage [email or web address] Password [facility] 12/30/2018, 3:29 PM

## 2018-12-30 NOTE — Progress Notes (Signed)
We were unable to titrate CBI rate down successfully. We tried to decrease the rate and had to increase again due to bladder spasms and passing of clots.

## 2018-12-30 NOTE — Progress Notes (Signed)
1 Day Post-Op   Subjective/Chief Complaint:    1 - Acute Renal Failure / Urinary Retention / Bilateral Hydronephrosis - Baseline Cr <1.2 with rise to 3s on admit for abdominal pain, malaise, ileus few weeks after total knee replacement. CT 8/13 on intake with very large prostate and symmetric bilateral hydro to bladder. Also on ACEI. After foley and hydration Cr back to <1.2 and resolved hydro by repeat imaging 8/16. GFR back to baseline with bladder drainage. Placed on CIC by Rosana Hoes at [facility] 11/2018 and sig heamturia with this as he was on blood thinners.   2 - Very Large Prostate - 86mL prostate with huge median lobe by CT 11/2018. He is on oxybutynin at baseline.   3 - Prostate Screening / Elevated PSA - H/o elevated PSA and negative BX by Rosana Hoes in past per report.  4 - Gross Hematuria / Clot Rention / Anemia -admitted 12/2018 with massive clot retention / anemia after being on attempted self-cath. He is on blood thinners for AFib w/o CVA/clots.   PMH sig for obesity, total knee, AFib/Eliquus (follows [facility]). His PCP is Dr. Ivonne Andrew who is company MD at [employer] where he works.   Interval: The patient underwent cystoscopy clot evacuation yesterday evening.  He tolerated the procedure well.  This morning on a very slow CBI the patient has pink-colored urine.  There is no clots.  He denies any bladder cramping.   Objective: Vital signs in last 24 hours: Temp:  [97.6 F (36.4 C)-98.1 F (36.7 C)] 97.8 F (36.6 C) (09/12 0531) Pulse Rate:  [60-74] 62 (09/12 0531) Resp:  [17-20] 19 (09/12 0531) BP: (103-159)/(57-83) 105/63 (09/12 0531) SpO2:  [94 %-100 %] 96 % (09/12 0531) Last BM Date: 12/28/18  Intake/Output from previous day: 09/11 0701 - 09/12 0700 In: 15290 [P.O.:240; I.V.:600; IV Piggyback:250] Out: 14400 [id number]; Blood:50] Intake/Output this shift: Total I/O In: 240 [P.O.:240] Out: 1700 [Urine:1700]  General appearance: alert, cooperative and appears  stated age Male genitalia: normal, 3 way foley in place on NS irrigation with improved but persisant hematuria. Extremities: extremities normal, atraumatic, no cyanosis or edema Skin: Skin color, texture, turgor normal. No rashes or lesions Neurologic: Grossly normal  Lab Results:  Recent Labs    12/29/18 0144 12/30/18 0544  WBC 14.7* 14.0*  HGB 9.0* 8.4*  HCT 29.8* 27.7*  PLT 301 340   BMET Recent Labs    12/29/18 0144 12/30/18 0544  NA 140 140  K 4.2 4.0  CL 107 108  CO2 23 21*  GLUCOSE 98 96  BUN 9 10  CREATININE 0.79 0.77  CALCIUM 8.7* 8.3*   PT/INR No results for input(s): LABPROT, INR in the last 72 hours. ABG No results for input(s): PHART, HCO3 in the last 72 hours.  Invalid input(s): PCO2, PO2  Studies/Results: No results found.  Anti-infectives: Anti-infectives (From admission, onward)   Start     Dose/Rate Route Frequency Ordered Stop   12/29/18 1430  ceFAZolin (ANCEF) IVPB 2g/100 mL premix     2 g 200 mL/hr over 30 Minutes Intravenous On call to O.R. 12/29/18 1418 12/30/18 0559   12/27/18 1700  cephALEXin (KEFLEX) capsule 500 mg     500 mg Oral 3 times daily with meals & bedtime 12/27/18 1110     12/25/18 1830  piperacillin-tazobactam (ZOSYN) IVPB 3.375 g  Status:  Discontinued     3.375 g 12.5 mL/hr over 240 Minutes Intravenous Every 8 hours 12/25/18 1755 12/27/18 1110  12/23/18 0400  piperacillin-tazobactam (ZOSYN) IVPB 3.375 g  Status:  Discontinued     3.375 g 12.5 mL/hr over 240 Minutes Intravenous Every 8 hours 12/23/18 0207 12/24/18 1446   12/22/18 2130  vancomycin (VANCOCIN) IVPB 1000 mg/200 mL premix     1,000 mg 200 mL/hr over 60 Minutes Intravenous  Once 12/22/18 2117 12/22/18 2333   12/22/18 2045  piperacillin-tazobactam (ZOSYN) IVPB 3.375 g     3.375 g 100 mL/hr over 30 Minutes Intravenous  Once 12/22/18 2033 12/22/18 2230      Assessment/Plan:  The patient's hemoglobin seems to have stabilized, the bleeding largely resolved.   The etiology of the patient's hematuria is unclear, but most likely was prosthetic in origin and related to infection and catheter associated trauma.  There was no abdominal bladder pathology.  He has pink-colored urine on a very slow continuous bladder irrigation.  He remains in a sinus rhythm, he is off all anticoagulation.  Our plan today is to stop his continuous bladder irrigation and see if his urine remains clear.  If so, he may be able to be discharged home from a urologic perspective tomorrow.  Given his urinary retention and postobstructive renal failure, he may be best served being discharged home with a Foley catheter.  I can discuss this with the patient tomorrow morning if being discharged is a reasonable option.  He would likely be best served long term with elective simple prostatectomy if it can be proven that he has preserved bladder contractility if new meds do not keep him retention and clot free.   Appreciate medical team and cards team comanagement.   Ardis Hughs 12/30/2018

## 2018-12-30 NOTE — Progress Notes (Signed)
Consult was placed to IV Team to attempt an  iv site in an upper extremity, so that the iv in the pt's left ankle area (placed 9/11 by anesthesia) can be removed;  Attempted x 2 using ultrasound, however was not able to thread catheters into veins on either attempt.  Pt is eating, drinking, and is on oral meds;  RN aware.  No further attempts made.

## 2018-12-30 NOTE — Progress Notes (Signed)
PT Cancellation Note  Patient Details Name: Philip Jackson MRN: [id number] DOB: 01/01/1951   Cancelled Treatment:    Reason Eval/Treat Not Completed: Other (comment) RN recommends continued hold for PT today. Will attempt to try back on next day of service as schedule/staff allow at this point.    [name] PT, DPT, CBIS  Supplemental Physical Therapist [place]    Pager [phone] Acute Rehab Office [phone]

## 2018-12-30 NOTE — Progress Notes (Signed)
Progress Note  Patient Name: Philip Jackson Date of Encounter: 12/30/2018  Primary Cardiologist: Sinclair Grooms, MD   Subjective   Remains in normal sinus rhythm on telemetry, vitals and blood pressure stable.  He reports no symptoms to me this morning.  Inpatient Medications    Scheduled Meds: . amiodarone  400 mg Oral BID  . cephALEXin  500 mg Oral TID WC & HS  . Chlorhexidine Gluconate Cloth  6 each Topical Daily  . ferrous sulfate  325 mg Oral 2 times weekly  . finasteride  5 mg Oral Daily  . mouth rinse  15 mL Mouth Rinse BID  . metoprolol succinate  100 mg Oral Daily  . mirabegron ER  25 mg Oral Daily  . omega-3 acid ethyl esters  1 g Oral BID  . pantoprazole  40 mg Oral Daily  . rosuvastatin  20 mg Oral QHS  . senna-docusate  1 tablet Oral BID   Continuous Infusions: . lactated ringers 50 mL/hr at 12/29/18 1951  . sodium chloride irrigation     PRN Meds: acetaminophen **OR** acetaminophen, HYDROmorphone (DILAUDID) injection, ondansetron **OR** ondansetron (ZOFRAN) IV, opium-belladonna, oxybutynin, oxyCODONE, polyethylene glycol, senna-docusate   Vital Signs    Vitals:   12/29/18 1852 12/29/18 2031 12/30/18 0000 12/30/18 0531  BP: (!) 150/83 (!) 103/57 (!) 119/58 105/63  Pulse: 67 74 60 62  Resp: 20 19 18 19   Temp: 97.6 F (36.4 C) 97.9 F (36.6 C) 98 F (36.7 C) 97.8 F (36.6 C)  TempSrc: Oral Oral Axillary Oral  SpO2: 100% 98% 97% 96%  Weight:      Height:        Intake/Output Summary (Last 24 hours) at 12/30/2018 0852 Last data filed at 12/30/2018 0851 Gross per 24 hour  Intake 15530 ml  Output 16100 ml  Net -570 ml   Last 3 Weights 12/22/2018 12/22/2018 12/19/2018  Weight (lbs) 189 lb 189 lb 189 lb 12.8 oz  Weight (kg) 85.73 kg 85.73 kg 86.093 kg      Telemetry    Normal sinus rhythm with heart rate mainly in the 60-70.  He does have intermittent bradycardia, into the 40s which is asymptomatic  ECG    N/A  Physical Exam   GEN:  No acute  distress Neck: No JVD Cardiac:  Regular rate and rhythm, no murmurs rubs or gallops Respiratory:  CTA B, no wheezing or rhonchi GI:  Soft nontender, no rebound or guarding MS: No edema; No deformity. Neuro:   Alert awake, oriented to person place and time, no focal deficits Psych: Normal mood and affect  Labs     Chemistry Recent Labs  Lab 12/28/18 0149 12/29/18 0144 12/30/18 0544  NA 139 140 140  K 3.6 4.2 4.0  CL 106 107 108  CO2 23 23 21*  GLUCOSE 104* 98 96  BUN 8 9 10   CREATININE 0.77 0.79 0.77  CALCIUM 8.1* 8.7* 8.3*  GFRNONAA >60 >60 >60  GFRAA >60 >60 >60  ANIONGAP 10 10 11      Hematology Recent Labs  Lab 12/28/18 0149 12/29/18 0144 12/30/18 0544  WBC 14.6* 14.7* 14.0*  RBC 2.87* 3.10* 2.91*  HGB 8.4* 9.0* 8.4*  HCT 27.5* 29.8* 27.7*  MCV 95.8 96.1 95.2  MCH 29.3 29.0 28.9  MCHC 30.5 30.2 30.3  RDW 15.1 14.9 14.6  PLT 289 301 340    Radiology    No results found.  Cardiac Studies   Echo 12/26/2018 1. The left  ventricle has normal systolic function, with an ejection fraction of 55-60%. The cavity size was normal. There is mildly increased left ventricular wall thickness. Left ventricular diastolic Doppler parameters are consistent with  pseudonormalization. No evidence of left ventricular regional wall motion abnormalities. 2. There is dilatation of the aortic root. 3. The aortic valve is tricuspid. No stenosis of the aortic valve. 4. The right ventricle has normal systolic function. The cavity was normal. There is no increase in right ventricular wall thickness. 5. No evidence of mitral valve stenosis. Trivial mitral regurgitation. 6. Left atrial size was mildly dilated. 7. Normal IVC size. PA systolic pressure 36 mmHg.  Patient Profile     68 y.o. male with a hx of paroxysmal atrial fibrillation, PSVT, sleep apneaon BiPap, hypertensionand recent admission for sepsis from possible duodenal perforation who admitted 12/22/2018 with acute  hemorrhagic shock 2nd blood loss form hematuria. Cardiology consulted for afib RVR.   Assessment & Plan    1. PAF with RVR - Converted to sinus rhythm on IV amiodarone. -He has been stable on his current dose of amiodarone and metoprolol -We will plan to continue his current dose of amiodarone while in-house, and then reduce him to 200 mg daily at discharge.  He will need to follow-up with his primary cardiologist Pernell Dupre, MD -Per his team due to his hemorrhagic shock and profound bleed, we will hold anticoagulation  [facility] will sign off.   Medication Recommendations: Continue current dose of amiodarone and metoprolol, please reduce his amiodarone to 200 mg daily at discharge Other recommendations (labs, testing, etc): N/A Follow up as an outpatient: Has been arranged   For questions or updates, please contact [place] Please consult [email or web address] for contact info under        Signed, Evalina Field, MD  12/30/2018, 8:52 AM

## 2018-12-30 NOTE — Progress Notes (Signed)
PT Cancellation Note  Patient Details Name: Philip Jackson MRN: [id number] DOB: 1950-12-15   Cancelled Treatment:    Reason Eval/Treat Not Completed: Other (comment) RN requests PT hold for now as patient has IV in his leg and they are working on getting this potentially saline locked or moved to a different site, IV team present and awaiting MD orders regarding this. Will attempt to return later if time/schedule allow.    [name] PT, DPT, CBIS  Supplemental Physical Therapist [facility]    Pager [phone] Acute Rehab Office [phone]

## 2018-12-31 LAB — BASIC METABOLIC PANEL
Anion gap: 9 (ref 5–15)
BUN: 7 mg/dL — ABNORMAL LOW (ref 8–23)
CO2: 25 mmol/L (ref 22–32)
Calcium: 8.2 mg/dL — ABNORMAL LOW (ref 8.9–10.3)
Chloride: 106 mmol/L (ref 98–111)
Creatinine, Ser: 0.86 mg/dL (ref 0.61–1.24)
GFR calc Af Amer: >60 mL/min (ref >60)
GFR calc non Af Amer: >60 mL/min (ref >60)
Glucose, Bld: 108 mg/dL — ABNORMAL HIGH (ref 70–99)
Potassium: 4 mmol/L (ref 3.5–5.1)
Sodium: 140 mmol/L (ref 135–145)

## 2018-12-31 LAB — CBC
HCT: 28 % — ABNORMAL LOW (ref 39.0–52.0)
Hemoglobin: 8.6 g/dL — ABNORMAL LOW (ref 13.0–17.0)
MCH: 29.3 pg (ref 26.0–34.0)
MCHC: 30.7 g/dL (ref 30.0–36.0)
MCV: 95.2 fL (ref 80.0–100.0)
Platelets: 297 10*3/uL (ref 150–400)
RBC: 2.94 MIL/uL — ABNORMAL LOW (ref 4.22–5.81)
RDW: 14.6 % (ref 11.5–15.5)
WBC: 11.9 10*3/uL — ABNORMAL HIGH (ref 4.0–10.5)
nRBC: 0 % (ref 0.0–0.2)

## 2018-12-31 LAB — MAGNESIUM: Magnesium: 1.8 mg/dL (ref 1.7–2.4)

## 2018-12-31 LAB — HEMOGLOBIN AND HEMATOCRIT, BLOOD
HCT: 29.3 % — ABNORMAL LOW (ref 39.0–52.0)
Hemoglobin: 9 g/dL — ABNORMAL LOW (ref 13.0–17.0)

## 2018-12-31 MED ORDER — FINASTERIDE 5 MG PO TABS: 5.0000 mg | ORAL_TABLET | Freq: Every day | ORAL | 11 refills | Status: AC

## 2018-12-31 NOTE — Progress Notes (Signed)
OT Cancellation Note  Patient Details Name: Philip Jackson MRN: [id number] DOB: July 27, 1950   Cancelled Treatment:    Reason Eval/Treat Not Completed: OT screened, no needs identified, will sign off  Bailey, Mickel Baas, OT Acute Rehabilitation Services [phone] [phone]    12/31/2018, 2:03 PM

## 2018-12-31 NOTE — Op Note (Signed)
NAME: ZACCAI, DRUDGE MEDICAL RECORD [id number] ACCOUNT [id number] DATE OF BIRTH:1950/08/26 FACILITY: [facility] LOCATION: [facility] PHYSICIAN:Rawleigh Rode Tresa Moore, MD  OPERATIVE REPORT  DATE OF PROCEDURE:  12/29/2018  PREOPERATIVE DIAGNOSIS:  Prostatic hypertrophy with refractory hematuria.  PROCEDURE:  Cystoscopy followed by evaluation of the fulguration of bleeders.  ESTIMATED BLOOD LOSS:  Approximately 100 mL of old clot, approximately 100 mL new blood.  DRAINS:  A 24-French 3-way Foley catheter to normal saline irrigation, efflux medium pink.  FINDINGS: 1.  Trilobar prostatic hypertrophy.  No significant mucosal oozing from the prostate. 2.  No evidence of papillary tumors in the bladder, which was bleeding.  INDICATIONS:  This is a pleasant but unfortunate 68 year old man with recent history of urinary retention.  He is on anticoagulation for primary prevention from AFib.  He has been managed at another institution with self-cath.  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  He presented to our institution with this.  It was temporized with a large 3-way catheter and irrigation which has been in place for several days.  He continued to have  hematuria with some problematic clots.  Options were discussed including continued care versus recommended path, a cystoscopy for evacuation, fulguration of bleeders, diagnostic and therapeutic intent as a temporizing measure for his likely massive  benign prostatic hypertrophy, and he wished to proceed.  Informed consent was then obtained and placed in the medical record.  PROCEDURE IN DETAIL:  The patient being identified and procedure being cystoscopy with fulguration of bleeders, clot evacuation confirmed.  Procedure timeout was performed.  Intravenous antibiotics were administered.  General anesthesia was induced.  The  patient was placed in a low lithotomy position.  A sterile field was  created by prepping and draping the base of the penis, perineum, and proximal thighs.  Cystourethroscopy was performed with a 21-French rigid cystoscope with offset lens.  The anterior  and posterior urethra revealed a large trilobar prostatic hypertrophy with some mucosal oozing from the site.  Inspection of the urinary bladder revealed mild to moderate trabeculation.  Several small areas of catheter irritation, but no large papillary  lesions or worrisome bladder lesions noted.  The scope was then exchanged for a 26-French resectoscope sheath with visual obturator, and clot evacuation was performed of only about 100 mL of old clots in the urinary bladder.  This was quite fortunate.   Next, using the medium-size resectoscope loop, fulguration was performed of the mucosal surface of the prostate bed, which resulted in excellent hemostasis.  Resectoscope was then exchanged for a 24-French 3-way Foley catheter, 20 mL were in the balloon,  connected to normal saline irrigation, efflux medium pink.  The procedure was terminated.  The patient tolerated the procedure well.  No immediate perioperative complications.  The patient was taken to postanesthesia care unit in stable condition with  plan for continued hospital admission.  LN/NUANCE  D:12/29/2018 T:12/30/2018 JOB:[id number]

## 2018-12-31 NOTE — Progress Notes (Signed)
Approximately 1100 this morning pt was up ambulating in hallway with PT, when pt got very pale and c/o shortness of breath. Pt was wearing paper mask and using FWW. PT at pt's side. Writer went to pt's side with chair and pt sat down. HR 98 and BP 92/55 at this time. Before walking, pt's HR 81 and BP 140/83.O2 sats 87% at this time. 1L of O2 applied via [place] with sats 98%. Family member arrived to room and Probation officer alerted her of this incident. Family member stating that this is what happened at home PTA when she had to call EMS for pt. Closely monitoring at this time. Pt sitting in chair in room without c/o, talking to family member.
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
## 2018-12-31 NOTE — Progress Notes (Signed)
Pt up to bathroom and got "dizzy and pale". VS WNL EKG done. Results in Epic. Have alerted MD via text. Pt now back in bed without c/o. O2 continues at 1 L/min via [place]. Will continue to monitor.

## 2018-12-31 NOTE — Progress Notes (Signed)
Offered CPAP to patient who refused stating that he did just fine last night with just oxygen in place.  Will be available if he changes his mind throughout shift.

## 2018-12-31 NOTE — Progress Notes (Addendum)
2 Days Post-Op   Subjective/Chief Complaint:    1 - Acute Renal Failure / Urinary Retention / Bilateral Hydronephrosis - Baseline Cr <1.2 with rise to 3s on admit for abdominal pain, malaise, ileus few weeks after total knee replacement. CT 8/13 on intake with very large prostate and symmetric bilateral hydro to bladder. Also on ACEI. After foley and hydration Cr back to <1.2 and resolved hydro by repeat imaging 8/16. GFR back to baseline with bladder drainage. Placed on CIC by Rosana Hoes at [facility] 11/2018 and sig heamturia with this as he was on blood thinners.   2 - Very Large Prostate - 41mL prostate with huge median lobe by CT 11/2018. He is on oxybutynin at baseline.   3 - Prostate Screening / Elevated PSA - H/o elevated PSA and negative BX by Rosana Hoes in past per report.  4 - Gross Hematuria / Clot Rention / Anemia -admitted 12/2018 with massive clot retention / anemia after being on attempted self-cath. He is on blood thinners for AFib w/o CVA/clots.   PMH sig for obesity, total knee, AFib/Eliquus (follows [facility]). His PCP is Dr. Ivonne Andrew who is company MD at [employer] where he works.   Interval: The patient had a good day, is feeling significantly better, was able to sleep.  The CBI was stopped early in the morning, was resumed midday because of recurrent bleeding, but currently his urine is crystal clear on a very slow CBI drip.  Objective: Vital signs in last 24 hours: Temp:  [97.7 F (36.5 C)-98.3 F (36.8 C)] 97.7 F (36.5 C) (09/13 0528) Pulse Rate:  [62-65] 65 (09/13 0528) Resp:  [18-19] 19 (09/13 0528) BP: (112-117)/(54-67) 117/62 (09/13 0528) SpO2:  [99 %-100 %] 99 % (09/13 0528) Last BM Date: 12/30/18  Intake/Output from previous day: 09/12 0701 - 09/13 0700 In: 11397.1 [P.O.:840; I.V.:1557.1] Out: [id number] [Urine:13200; Emesis/NG output:1] Intake/Output this shift: Total I/O In: -  Out: 1500 [Urine:1500]  General appearance: alert, cooperative and appears  stated age Male genitalia: normal, 3 way foley in place on NS irrigation with clear E flux Extremities: extremities normal, atraumatic, no cyanosis or edema Skin: Skin color, texture, turgor normal. No rashes or lesions Neurologic: Grossly normal  Lab Results:  Recent Labs    12/30/18 0544 12/31/18 0554  WBC 14.0* 11.9*  HGB 8.4* 8.6*  HCT 27.7* 28.0*  PLT 340 297   BMET Recent Labs    12/30/18 0544 12/31/18 0554  NA 140 140  K 4.0 4.0  CL 108 106  CO2 21* 25  GLUCOSE 96 108*  BUN 10 7*  CREATININE 0.77 0.86  CALCIUM 8.3* 8.2*   PT/INR No results for input(s): LABPROT, INR in the last 72 hours. ABG No results for input(s): PHART, HCO3 in the last 72 hours.  Invalid input(s): PCO2, PO2  Studies/Results: No results found.  Anti-infectives: Anti-infectives (From admission, onward)   Start     Dose/Rate Route Frequency Ordered Stop   12/29/18 1430  ceFAZolin (ANCEF) IVPB 2g/100 mL premix     2 g 200 mL/hr over 30 Minutes Intravenous On call to O.R. 12/29/18 1418 12/30/18 0559   12/27/18 1700  cephALEXin (KEFLEX) capsule 500 mg     500 mg Oral 3 times daily with meals & bedtime 12/27/18 1110     12/25/18 1830  piperacillin-tazobactam (ZOSYN) IVPB 3.375 g  Status:  Discontinued     3.375 g 12.5 mL/hr over 240 Minutes Intravenous Every 8 hours 12/25/18 1755 12/27/18 1110  12/23/18 0400  piperacillin-tazobactam (ZOSYN) IVPB 3.375 g  Status:  Discontinued     3.375 g 12.5 mL/hr over 240 Minutes Intravenous Every 8 hours 12/23/18 0207 12/24/18 1446   12/22/18 2130  vancomycin (VANCOCIN) IVPB 1000 mg/200 mL premix     1,000 mg 200 mL/hr over 60 Minutes Intravenous  Once 12/22/18 2117 12/22/18 2333   12/22/18 2045  piperacillin-tazobactam (ZOSYN) IVPB 3.375 g     3.375 g 100 mL/hr over 30 Minutes Intravenous  Once 12/22/18 2033 12/22/18 2230      Assessment/Plan:  The patient's hemoglobin seems to have stabilized, the bleeding largely resolved.  The etiology of  the patient's hematuria is unclear, but most likely was prosthetic in origin and related to infection and catheter associated trauma.  There was no abdominal bladder pathology. He remains in a sinus rhythm, he is off all anticoagulation.  The plan is to stop the continuous bladder irrigation and reevaluate him at noon.  If it remains clear, we will plan for him to be discharged home with the Foley catheter.  We will then schedule him for urodynamics as an outpatient, hopefully in the near future, to evaluate his bladder function prior to discussing any definitive management.  Appreciate medical team and cards team comanagement.   Ardis Hughs 12/31/2018   Addendum: would recommend discontinuing Myrbetriq and Oxybutynin for his bladder spasms, because these may impact the results of his urodynamic test.  I have discontinued them as inpatient, they should not be continued at discharge.  OK to continue with Finasteride.

## 2018-12-31 NOTE — Progress Notes (Signed)
PROGRESS NOTE  Philip Jackson [id number] DOB: 1950/08/21 DOA: 12/22/2018 PCP: Leonides Sake, MD  HPI/Recap of past 66 hours: 67 year old male with a history of paroxysmal atrial fibrillation, sleep apnea who was discharged 2 weeks ago after admitted for sepsis from intra-abdominal cause at that time he had possible contained duodenal perforation. Also had acute kidney injury with hematuria and urine retention. At that time urology saw the patient and he was discharged home. Patient was doing self cath for past 2 days. He noticed worsening hematuria became weak and diaphoretic. EMS was called. In the ED patient became hypotensive with blood pressure systolic in 0000000, tachycardia showed elevated lactate and leukocytosis. He was given emergent blood transfusion as there was 5 g drop in hemoglobin with frank hematuria. CT scan showed bladder mass versus clot. Urology was consulted and continuous bladder irrigation started.  Hospital course complicated by atrial fibrillation with RVR therefore cardiology consult.  Bleeding appears to be improving.  Plans for cystoscopy, completed on 12/29/2018.  12/31/18: POD #2 post cystoscopy clot evacuation.  Patient seen and examined at his bedside this morning.  Reports intermittent bladder spasms improved with current management.  No cardiopulmonary or GI symptoms.  Passing of clot yesterday afternoon and bladder spasm.  Hemoglobin stable this morning 8.6 from 8.4 yesterday with ongoing bladder irrigation.    Assessment/Plan: Principal Problem:   Acute blood loss anemia Active Problems:   OSA treated with BiPAP   Hematuria   PAF (paroxysmal atrial fibrillation) (HCC)  Acute blood loss anemia/hemorrhagic shock secondary to gross hematuria Recently on Eliquis for A. Fib Presented with hemoglobin of 6.1 post 2 unit PRBCs Hemoglobin stable 8.6 on 12/31/2018 from 8.4 yesterday  Vital signs reviewed and are stable Continue to hold off anticoagulation   Gross hematuria in the setting of urinary retention Urine culture taken on 12/22/2018- final Blood cultures x2 drawn on 12/22/2018- final Ongoing continuous bladder irrigation Seen by urology: Following  Intermittent bladder spasms Continue Opium belladonna and ditropan as ordered by urology, improved on this regimen.  POD #2 post cystoscopy clot evacuation and fulguration of bladder Very large prostate, may need prostatectomy outpatient per urology Indwelling Foley catheter in place  Overactive bladder  Indwelling Foley catheter in place Continue Myrbetrick  Recent E. coli UTI, POA This was diagnosed outpatient at his primary care's office Records indicate it is pansensitive Currently on Keflex 500 mg 3 times daily Urine culture no growth, final. Defer to urology to DC antibiotics  Iron deficiency anemia Continue home dose iron supplement 325 mg twice daily Continue bowel regimen to avoid iron supplement induced constipation  Paroxysmal A. Fib Currently rate controlled on Toprol-XL 100 mg daily On amiodarone 400 mg twice daily for rhythm control Continue to hold off Eliquis Continue to hold off on Cardizem due to soft blood pressure to avoid hypotension Reduce amiodarone dose at discharge to 200 mg daily as recommended by cardiology Follow-up with your cardiologist  Hyperlipidemia Continue Crestor  GERD Continue Protonix  BPH with urinary retention Continue Proscar Indwelling Foley catheter in place   DVT prophylaxis: SCDs Code Status: Full code Family Communication: None at bedside Disposition Plan:  Discharge to home when urology signs off and patient is hemodynamically stable.  Consultants:   Urology  Cardiology  Procedures:   None  Antimicrobials:   Keflex    Objective: Vitals:   12/30/18 0531 12/30/18 1439 12/30/18 2014 12/31/18 0528  BP: 105/63 112/67 (!) 114/54 117/62  Pulse: 62 64 62 65  Resp:  19 18 18 19   Temp: 97.8 F (36.6 C) 98.3  F (36.8 C) 98.2 F (36.8 C) 97.7 F (36.5 C)  TempSrc: Oral Oral Oral Oral  SpO2: 96% 99% 100% 99%  Weight:      Height:        Intake/Output Summary (Last 24 hours) at 12/31/2018 1004 Last data filed at 12/31/2018 [id number] Gross per 24 hour  Intake 11157.1 ml  Output 13001 ml  Net -1843.9 ml   Filed Weights   12/22/18 1855 12/22/18 1904  Weight: 85.7 kg 85.7 kg    Exam:  . General: 68 y.o. year-old male well-developed well-nourished in no acute distress.  Alert and oriented x3.   . Cardiovascular: Regular rate and rhythm no rubs or gallops no JVD or thyromegaly noted. Marland Kitchen Respiratory: Clear to auscultation no wheezes or rales.  Poor inspiratory effort.   . Abdomen: Soft nontender nondistended normal bowel sounds present.  . Musculoskeletal: No lower extremity edema.  2 out of 4 pulses in all 4 extremities. Marland Kitchen Psychiatry: Mood is appropriate for condition and setting.   Data Reviewed: CBC: Recent Labs  Lab 12/27/18 0136 12/28/18 0149 12/29/18 0144 12/30/18 0544 12/31/18 0554  WBC 13.7* 14.6* 14.7* 14.0* 11.9*  HGB 7.7* 8.4* 9.0* 8.4* 8.6*  HCT 24.7* 27.5* 29.8* 27.7* 28.0*  MCV 95.0 95.8 96.1 95.2 95.2  PLT 279 289 301 340 [id number]   Basic Metabolic Panel: Recent Labs  Lab 12/27/18 0136 12/28/18 0149 12/29/18 0144 12/30/18 0544 12/31/18 0554  NA 140 139 140 140 140  K 3.8 3.6 4.2 4.0 4.0  CL 106 106 107 108 106  CO2 26 23 23  21* 25  GLUCOSE 113* 104* 98 96 108*  BUN 8 8 9 10  7*  CREATININE 0.82 0.77 0.79 0.77 0.86  CALCIUM 8.2* 8.1* 8.7* 8.3* 8.2*  MG  --  2.0 1.9 1.8 1.8   GFR: Estimated Creatinine Clearance: 89.2 mL/min (by C-G formula based on SCr of 0.86 mg/dL). Liver Function Tests: No results for input(s): AST, ALT, ALKPHOS, BILITOT, PROT, ALBUMIN in the last 168 hours. No results for input(s): LIPASE, AMYLASE in the last 168 hours. No results for input(s): AMMONIA in the last 168 hours. Coagulation Profile: No results for input(s): INR, PROTIME in the  last 168 hours. Cardiac Enzymes: No results for input(s): CKTOTAL, CKMB, CKMBINDEX, TROPONINI in the last 168 hours. BNP (last 3 results) No results for input(s): PROBNP in the last 8760 hours. HbA1C: No results for input(s): HGBA1C in the last 72 hours. CBG: Recent Labs  Lab 12/26/18 1429  GLUCAP 120*   Lipid Profile: No results for input(s): CHOL, HDL, LDLCALC, TRIG, CHOLHDL, LDLDIRECT in the last 72 hours. Thyroid Function Tests: No results for input(s): TSH, T4TOTAL, FREET4, T3FREE, THYROIDAB in the last 72 hours. Anemia Panel: No results for input(s): VITAMINB12, FOLATE, FERRITIN, TIBC, IRON, RETICCTPCT in the last 72 hours. Urine analysis:    Component Value Date/Time   COLORURINE RED (A) 12/22/2018 2016   APPEARANCEUR CLOUDY (A) 12/22/2018 2016   LABSPEC 1.033 (H) 12/22/2018 2016   PHURINE 7.0 12/22/2018 2016   GLUCOSEU 150 (A) 12/22/2018 2016   HGBUR MODERATE (A) 12/22/2018 2016   BILIRUBINUR NEGATIVE 12/22/2018 2016   KETONESUR NEGATIVE 12/22/2018 2016   PROTEINUR >=300 (A) 12/22/2018 2016   NITRITE NEGATIVE 12/22/2018 2016   LEUKOCYTESUR NEGATIVE 12/22/2018 2016   Sepsis Labs: @LABRCNTIP (procalcitonin:4,lacticidven:4)  ) Recent Results (from the past 240 hour(s))  Urine culture     Status: None  Collection Time: 12/22/18  8:16 PM   Specimen: Urine, Clean Catch  Result Value Ref Range Status   Specimen Description   Final    URINE, CLEAN CATCH Performed at [facility], [place] [address] [place], [place] [address]    Special Requests   Final    NONE Performed at [facility], [place] [address] [place], [place] [address]    Culture   Final    NO GROWTH Performed at [facility], [place] [address] [place], [place] [address]    Report Status 12/24/2018 FINAL  Final  Blood culture (routine x 2)     Status: None   Collection Time: 12/22/18  8:55 PM   Specimen: BLOOD  Result Value Ref Range Status   Specimen  Description   Final    BLOOD BLOOD LEFT ARM Performed at [place] [address] [place], [place] [address]    Special Requests   Final    BOTTLES DRAWN AEROBIC AND ANAEROBIC Blood Culture adequate volume Performed at [place] [address], [place], [place] [address]    Culture   Final    NO GROWTH 5 DAYS Performed at [facility], [place] [address] [place], [place] [address]    Report Status 12/27/2018 FINAL  Final  Blood culture (routine x 2)     Status: None   Collection Time: 12/22/18  8:55 PM   Specimen: BLOOD  Result Value Ref Range Status   Specimen Description   Final    BLOOD LEFT ANTECUBITAL Performed at [place] [address] [place], [place] [address]    Special Requests   Final    BOTTLES DRAWN AEROBIC AND ANAEROBIC Blood Culture results may not be optimal due to an inadequate volume of blood received in culture bottles Performed at [place] [address] [place], [place] [address]    Culture   Final    NO GROWTH 5 DAYS Performed at [facility], [place] [address], [place], [place] [address]    Report Status 12/27/2018 FINAL  Final  SARS Coronavirus 2 [facility] order, Performed in [facility] hospital lab) Nasopharyngeal Nasopharyngeal Swab     Status: None   Collection Time: 12/22/18  8:55 PM   Specimen: Nasopharyngeal Swab  Result Value Ref Range Status   SARS Coronavirus 2 NEGATIVE NEGATIVE Final    Comment: (NOTE) If result is NEGATIVE SARS-CoV-2 target nucleic acids are NOT DETECTED. The SARS-CoV-2 RNA is generally detectable in upper and lower  respiratory specimens during the acute phase of infection. The lowest  concentration of SARS-CoV-2 viral copies this assay can detect is 250  copies / mL. A negative result does not preclude SARS-CoV-2 infection  and should not be used as the sole basis for treatment or other  patient management decisions.   A negative result may occur with  improper specimen collection / handling, submission of specimen other  than nasopharyngeal swab, presence of viral mutation(s) within the  areas targeted by this assay, and inadequate number of viral copies  (<250 copies / mL). A negative result must be combined with clinical  observations, patient history, and epidemiological information. If result is POSITIVE SARS-CoV-2 target nucleic acids are DETECTED. The SARS-CoV-2 RNA is generally detectable in upper and lower  respiratory specimens dur ing the acute phase of infection.  Positive  results are indicative of active infection with SARS-CoV-2.  Clinical  correlation with patient history and other diagnostic information is  necessary to determine patient infection status.  Positive results do  not rule out bacterial infection or co-infection with other viruses. If result is PRESUMPTIVE POSTIVE SARS-CoV-2 nucleic acids MAY BE PRESENT.   A presumptive positive result was obtained on the submitted specimen  and confirmed on repeat testing.  While 2019 novel coronavirus  (SARS-CoV-2) nucleic acids may be present in the submitted sample  additional confirmatory testing may be necessary for epidemiological  and / or clinical management purposes  to differentiate between  SARS-CoV-2 and other Sarbecovirus currently known to infect humans.  If clinically indicated additional testing with an alternate test  methodology [phone]) is advised. The SARS-CoV-2 RNA is generally  detectable in upper and lower respiratory sp ecimens during the acute  phase of infection. The expected result is Negative. Fact Sheet for Patients:  StrictlyIdeas.no Fact Sheet for Healthcare Providers: BankingDealers.co.za This test is not yet approved or cleared by the [place] FDA and has been authorized for detection and/or diagnosis of SARS-CoV-2 by FDA under an Emergency Use  Authorization (EUA).  This EUA will remain in effect (meaning this test can be used) for the duration of the COVID-19 declaration under Section 564(b)(1) of the Act, 21 U.S.C. section 360bbb-3(b)(1), unless the authorization is terminated or revoked sooner. Performed at [facility], [place] [address], [place], [place] [address]   MRSA PCR Screening     Status: None   Collection Time: 12/23/18  2:19 AM   Specimen: Nasal Mucosa; Nasopharyngeal  Result Value Ref Range Status   MRSA by PCR NEGATIVE NEGATIVE Final    Comment:        The GeneXpert MRSA Assay (FDA approved for NASAL specimens only), is one component of a comprehensive MRSA colonization surveillance program. It is not intended to diagnose MRSA infection nor to guide or monitor treatment for MRSA infections. Performed at [facility], [place] [address], [place], [place] [address]       Studies: No results found.  Scheduled Meds: . amiodarone  400 mg Oral BID  . cephALEXin  500 mg Oral TID WC & HS  . Chlorhexidine Gluconate Cloth  6 each Topical Daily  . ferrous sulfate  325 mg Oral 2 times weekly  . finasteride  5 mg Oral Daily  . mouth rinse  15 mL Mouth Rinse BID  . metoprolol succinate  100 mg Oral Daily  . mirabegron ER  25 mg Oral Daily  . omega-3 acid ethyl esters  1 g Oral BID  . pantoprazole  40 mg Oral Daily  . rosuvastatin  20 mg Oral QHS  . senna-docusate  1 tablet Oral BID    Continuous Infusions: . sodium chloride irrigation       LOS: 9 days     Kayleen Memos, MD [facility] Pager [phone]  If 7PM-7AM, please contact night-coverage [email or web address] Password [email or web address] 12/31/2018, 10:04 AM

## 2019-01-01 ENCOUNTER — Encounter: Payer: BC Managed Care – PPO | Admitting: Physical Therapy

## 2019-01-01 LAB — CBC
HCT: 29.6 % — ABNORMAL LOW (ref 39.0–52.0)
Hemoglobin: 9 g/dL — ABNORMAL LOW (ref 13.0–17.0)
MCH: 28.8 pg (ref 26.0–34.0)
MCHC: 30.4 g/dL (ref 30.0–36.0)
MCV: 94.9 fL (ref 80.0–100.0)
Platelets: 293 10*3/uL (ref 150–400)
RBC: 3.12 MIL/uL — ABNORMAL LOW (ref 4.22–5.81)
RDW: 14.8 % (ref 11.5–15.5)
WBC: 16.3 10*3/uL — ABNORMAL HIGH (ref 4.0–10.5)
nRBC: 0 % (ref 0.0–0.2)

## 2019-01-01 MED ORDER — OXYCODONE HCL 5 MG PO TABS: 5.0000 mg | ORAL_TABLET | Freq: Every day | ORAL | 0 refills | Status: DC | PRN

## 2019-01-01 MED ORDER — AMIODARONE HCL 200 MG PO TABS: 200.0000 mg | ORAL_TABLET | Freq: Every day | ORAL | 0 refills | Status: DC

## 2019-01-01 NOTE — Discharge Summary (Signed)
Discharge Summary  Philip Jackson [id number] DOB: Aug 20, 1950  PCP: Leonides Sake, MD  Admit date: 12/22/2018 Discharge date: 01/01/2019  Time spent: 35 minutes  Recommendations for Outpatient Follow-up:  1. Follow-up with urology 2. Follow-up with your cardiologist 3. Follow-up with your PCP 4. Take your medications as prescribed  Discharge Diagnoses:  Active Hospital Problems   Diagnosis Date Noted   Acute blood loss anemia 12/22/2018   Hematuria 12/23/2018   PAF (paroxysmal atrial fibrillation) ([facility]) 12/23/2018   OSA treated with BiPAP 09/15/2014    Resolved Hospital Problems  No resolved problems to display.    Discharge Condition: Stable  Diet recommendation: Heart healthy diet.  Vitals:   12/31/18 2116 01/01/19 0530  BP: 111/74 104/62  Pulse: 91 83  Resp: 18 19  Temp: (!) 97.5 F (36.4 C) 98 F (36.7 C)  SpO2: 94% 93%    History of present illness:  68 year old male with a history of paroxysmal atrial fibrillation on Eliquis, sleep apnea who was discharged 2 weeks ago after admitted for sepsis from intra-abdominal cause at that time he had possible contained duodenal perforation. Also had acute kidney injury with hematuria and urine retention. At that time urology saw the patient and he was discharged home. Patient was doing self cath for past 2 days. He noticed worsening hematuria became weak and diaphoretic. EMS was called. In the ED patient became hypotensive with blood pressure systolic in 0000000, tachycardia showed elevated lactate and leukocytosis. He was given emergent blood transfusion as there was 5 g drop in hemoglobin with frank hematuria. CT scan showed bladder mass versus clot. Urology was consulted and continuous bladder irrigation started. Hospital course complicated by atrial fibrillation with RVR therefore cardiology consulted. Cystoscopy, completed on 12/29/2018.  01/01/19: POD #3 post cystoscopy clot evacuation.  Patient was seen and  examined at his bedside this morning.  No acute events overnight.  He has no new complaints.  Continuous bladder irrigation stopped on 12/31/2018.  His urine is clearing out.  Hemoglobin stable 9.0 on 01/01/2019 from 9.0 on 12/31/2018.  He denies any cardiopulmonary or GI symptoms.  Bladder spasms are resolving.  Reports less frequent bladder spasms.    Hospital Course:  Principal Problem:   Acute blood loss anemia Active Problems:   OSA treated with BiPAP   Hematuria   PAF (paroxysmal atrial fibrillation) ([facility])  Acute blood loss anemia/hemorrhagic shock secondary to gross hematuria, now stable Previously on Eliquis for A. Fib, continue to hold off anticoagulation Presented with hemoglobin of 6.1 post 2 unit PRBCs Hemoglobin stable 9.0 on 01/01/2019 from 9.0 on 12/31/2018. Urine is clearing out and gross hematuria is resolving Vital signs reviewed and are stable.  Gross hematuria in the setting of urinary retention Urine culture taken on 12/22/2018 negative final Blood cultures x2 drawn on 12/22/2018 negative final Continuous bladder irrigation discontinued on 12/31/2018. Seen by urology: Continue Foley catheter, and finasteride. Follow-up with urology outpatient  Intermittent bladder spasms Occurring less frequently Oxycodone 5 mg daily as needed for severe pain Follow-up with urology  POD #3 post cystoscopy clot evacuation and fulguration of bladder Very large prostate, may need prostatectomy outpatient per urology Indwelling Foley catheter in place, continue until sees urology outpatient  Overactive bladder  Indwelling Foley catheter in place  Myrbetrick discontinued by urology because these may impact the results of his planned urodynamic test.  Recent E. coli UTI, POA This was diagnosed outpatient at his primary care's office Records indicate it is pansensitive Completed 5 days of  Keflex 500 mg 3 times daily Urine culture no growth, final. Discussed with Dr. Diona Fanti via  phone on 01/01/2019 okay to DC antibiotics.  Iron deficiency anemia Continue home dose iron supplement 325 mg twice weekly Continue bowel regimen to avoid iron supplement induced constipation  Paroxysmal A. Fib Currently rate controlled on Toprol-XL 100 mg daily Continue amiodarone 200 mg daily as recommended by cardiology Continue to hold off Eliquis, anticoagulation Follow-up with cardiology outpatient  Hyperlipidemia Continue Crestor  GERD Continue Protonix  BPH with urinary retention Continue finasteride Continue indwelling Foley catheter     Code Status:Full code  Consultants:  Urology  Cardiology  Procedures:  Cystoscopy on 12/29/2018.  Antimicrobials:   Completed 5 days of Keflex    Discharge Exam: BP 104/62 (BP Location: Right Arm)    Pulse 83    Temp 98 F (36.7 C) (Oral)    Resp 19    Ht 5\' 9"  (1.753 m)    Wt 85.7 kg    SpO2 93%    BMI 27.91 kg/m   General: 68 y.o. year-old male well developed well nourished in no acute distress.  Alert and oriented x3.  Cardiovascular: Regular rate and rhythm with no rubs or gallops.  No thyromegaly or JVD noted.    Respiratory: Clear to auscultation with no wheezes or rales. Good inspiratory effort.  Abdomen: Soft nontender nondistended with normal bowel sounds x4 quadrants.  Musculoskeletal: No lower extremity edema. 2/4 pulses in all 4 extremities.  Psychiatry: Mood is appropriate for condition and setting  Discharge Instructions You were cared for by a hospitalist during your hospital stay. If you have any questions about your discharge medications or the care you received while you were in the hospital after you are discharged, you can call the unit and asked to speak with the hospitalist on call if the hospitalist that took care of you is not available. Once you are discharged, your primary care physician will handle any further medical issues. Please note that NO REFILLS for any discharge  medications will be authorized once you are discharged, as it is imperative that you return to your primary care physician (or establish a relationship with a primary care physician if you do not have one) for your aftercare needs so that they can reassess your need for medications and monitor your lab values.   Allergies as of 01/01/2019      Reactions   Bee Pollen Anaphylaxis   Allergic to bees   Nsaids    Duodenal ulcer.  anticoagulated      Medication List    STOP taking these medications   diltiazem 360 MG 24 hr capsule Commonly known as: Cardizem CD   Eliquis 5 MG Tabs tablet Generic drug: apixaban   pantoprazole 40 MG tablet Commonly known as: PROTONIX   PROSTATE SUPPORT PO   traMADol 50 MG tablet Commonly known as: ULTRAM     TAKE these medications   acetaminophen 500 MG tablet Commonly known as: TYLENOL Take two (2) tablets by mouth every 4 to 6 hours as needed for pain.   amiodarone 200 MG tablet Commonly known as: PACERONE Take 1 tablet (200 mg total) by mouth daily.   clobetasol cream 0.05 % Commonly known as: TEMOVATE Apply 1 application topically 2 (two) times daily as needed (psoriasis (elbows)).   ferrous sulfate 325 (65 FE) MG EC tablet Take 325 mg by mouth 2 (two) times a week. Tuesdays and Thursdays   finasteride 5 MG tablet Commonly known  as: PROSCAR Take 1 tablet (5 mg total) by mouth daily.   Fish Oil 1200 MG Caps Take 1,200 mg by mouth 2 (two) times daily.   Lubricant Eye Drops 0.4-0.3 % Soln Generic drug: Polyethyl Glycol-Propyl Glycol Place 1 drop into both eyes daily as needed (dry/irritated eyes.).   methocarbamol 500 MG tablet Commonly known as: ROBAXIN Take 1 tablet (500 mg total) by mouth every 6 (six) hours as needed for muscle spasms.   metoprolol succinate 100 MG 24 hr tablet Commonly known as: TOPROL-XL Take 1 tablet (100 mg total) by mouth daily.   omeprazole 40 MG capsule Commonly known as: PRILOSEC Take 40 mg by  mouth at bedtime.   oxyCODONE 5 MG immediate release tablet Commonly known as: Oxy IR/ROXICODONE Take 1 tablet (5 mg total) by mouth daily as needed for severe pain or breakthrough pain. What changed:   how much to take  when to take this  reasons to take this   rosuvastatin 20 MG tablet Commonly known as: CRESTOR Take 20 mg by mouth at bedtime.   senna-docusate 8.6-50 MG tablet Commonly known as: Senokot-S Take 1 tablet by mouth 2 (two) times daily.      Allergies  Allergen Reactions   Bee Pollen Anaphylaxis    Allergic to bees   Nsaids     Duodenal ulcer.  anticoagulated   Follow-up Information    [facility] Follow up.   Why: Please contact the office to make an appointment with Dr. Tresa Moore or one of our nurse practitioners for 1-2 weeks from discharge.  Contact information: [place]       Hamrick, Lorin Mercy, MD. Call in 1 day(s).   Specialty: Family Medicine Why: please call for a post hospital follow up appointment. Contact information: [place] [address] [phone]        Belva Crome, MD .   Specialty: Cardiology Contact information: [phone] [address] [place] [address] [phone]            The results of significant diagnostics from this hospitalization (including imaging, microbiology, ancillary and laboratory) are listed below for reference.    Significant Diagnostic Studies: Dg Abd 1 View  Result Date: 12/03/2018 CLINICAL DATA:  Abdominal distention. EXAM: ABDOMEN - 1 VIEW COMPARISON:  None. FINDINGS: Multiple mild to moderately dilated small bowel loops are seen, with a paucity of colonic gas. This is suspicious for small bowel obstruction. IMPRESSION: Findings suspicious for small bowel obstruction. Electronically Signed   By: Marlaine Hind M.D.   On: 12/03/2018 08:57   Ct Abdomen Pelvis W Contrast  Result Date:  12/22/2018 CLINICAL DATA:  Abdominal distention EXAM: CT ABDOMEN AND PELVIS WITH CONTRAST TECHNIQUE: Multidetector CT imaging of the abdomen and pelvis was performed using the standard protocol following bolus administration of intravenous contrast. CONTRAST:  145mL OMNIPAQUE IOHEXOL 300 MG/ML  SOLN COMPARISON:  12/03/2018 FINDINGS: Lower chest: Coronary artery calcifications.  No acute abnormality. Hepatobiliary: No focal hepatic abnormality. Gallbladder unremarkable. Pancreas: No focal abnormality or ductal dilatation. Spleen: No focal abnormality.  Normal size. Adrenals/Urinary Tract: Mild left hydronephrosis. No hydronephrosis on the right. Adrenal glands unremarkable. There is a large abnormal filling defect filling much of the urinary bladder. This could reflect mass or blood clot. Foley catheter is present within the bladder. Stomach/Bowel: Normal appendix. Scattered colonic diverticulosis. No active diverticulitis. Stomach and small bowel decompressed, unremarkable. Vascular/Lymphatic: Aortic atherosclerosis. No  enlarged abdominal or pelvic lymph nodes. Reproductive: Prostate enlargement. Other: No free fluid or free air. Musculoskeletal: No acute bony abnormality. IMPRESSION: Large irregular filling deflect within the urinary bladder which could reflect large bladder wall mass or blood clot. There is mild left hydroureteronephrosis. This could be further evaluated with cystoscopy. Aortic atherosclerosis.  Coronary artery disease. Colonic diverticulosis. Electronically Signed   By: Rolm Baptise M.D.   On: 12/22/2018 21:47   Ct Abdomen Pelvis W Contrast  Result Date: 12/03/2018 CLINICAL DATA:  Mid abdominal pain with nausea and vomiting, gastric ulcer. EXAM: CT ABDOMEN AND PELVIS WITH CONTRAST TECHNIQUE: Multidetector CT imaging of the abdomen and pelvis was performed using the standard protocol following bolus administration of intravenous contrast. CONTRAST:  162mL OMNIPAQUE IOHEXOL 300 MG/ML  SOLN  COMPARISON:  CT abdomen dated 11/30/2018. FINDINGS: Lower chest: Small bilateral pleural effusions, RIGHT slightly greater than LEFT, with associated passive atelectasis. Hepatobiliary: No focal liver abnormality is seen. No gallstones, gallbladder wall thickening, or biliary dilatation. Pancreas: Unremarkable. No pancreatic ductal dilatation or surrounding inflammatory changes. Spleen: Normal in size without focal abnormality. Adrenals/Urinary Tract: Adrenal glands appear normal. Mild RIGHT-sided hydronephrosis and moderate RIGHT-sided hydroureter. RIGHT periureteral inflammation/fluid stranding and thickening/enhancement of the walls of the proximal RIGHT ureter. LEFT kidney is unremarkable without mass, stone or hydronephrosis. LEFT ureter appears grossly normal. Bladder is decompressed by Foley catheter. Hyperdense material at the bladder base, contiguous with the underlying prostate gland versus intraluminal bladder wall mass or blood products. Stomach/Bowel: The large bowel is decompressed throughout. Appendix is normal. Distal small bowel is decompressed. Remainder of the small bowel is mildly to moderately distended with fluid and air with ileus being more likely than mechanical obstruction given the lack of transition zone. Stomach is also distended, moderate to severe in degree. Decreased thickening of the walls of the 2/3 portions of duodenum. The periduodenal fluid and/or phlegmonous change in the retroperitoneum is slightly less prominent. Vascular/Lymphatic: Aortic atherosclerosis. No acute appearing vascular abnormality. No enlarged lymph nodes seen in the abdomen or pelvis. Reproductive: Prostate gland is enlarged. Other: No circumscribed abscess collection seen. Small amount of free fluid in the lower pelvis. No free intraperitoneal air. Musculoskeletal: No acute or suspicious osseous finding. Degenerative spondylosis of the thoracolumbar spine, mild to moderate in degree. Anasarca within the  subcutaneous soft tissues of the lower abdomen and pelvis. IMPRESSION: 1. Decreased thickening of the walls of the 2/3 portions of the duodenum suggesting interval improvement. The periduodenal fluid and/or phlegmonous change in the retroperitoneum is also slightly less prominent. No circumscribed abscess collection seen. No free intraperitoneal air to suggest perforation. 2. Stable mild RIGHT-sided hydronephrosis and moderate RIGHT-sided hydroureter, with associated RIGHT periureteral inflammation/fluid stranding and thickening/enhancement of the walls of the proximal RIGHT ureter. This is likely reactive to the adjacent periduodenal fluid and phlegmonous change. Cannot exclude secondary ureteral infection. 3. Bladder is decompressed by Foley catheter. Hyperdense material at the bladder base, contiguous with the underlying prostate gland versus intraluminal bladder wall mass or blood products. Recommend Urology consultation for possible cystoscopy. 4. Majority of the small bowel is mildly to moderately distended with fluid and air. Stomach is also distended, moderate to severe in degree. Findings are most suggestive of ileus, less likely small bowel obstruction without definable transition zone. 5. Anasarca within the subcutaneous soft tissues of the lower abdomen and pelvis. 6. Small bilateral pleural effusions, RIGHT slightly greater than LEFT, with associated passive atelectasis. 7. Aortic atherosclerosis. Aortic Atherosclerosis (ICD10-I70.0). Electronically Signed   By:  Franki Cabot M.D.   On: 12/03/2018 11:27   Dg Chest Portable 1 View  Result Date: 12/22/2018 CLINICAL DATA:  Hypotension EXAM: PORTABLE CHEST 1 VIEW COMPARISON:  November 30, 2018 FINDINGS: The heart size and mediastinal contours are within normal limits. Both lungs are clear. The visualized skeletal structures are unremarkable. IMPRESSION: No active disease. Electronically Signed   By: Constance Holster M.D.   On: 12/22/2018 20:28   Dg Abd  Portable 1v  Result Date: 12/24/2018 CLINICAL DATA:  68 year old male with abdominal distention. EXAM: PORTABLE ABDOMEN - 1 VIEW COMPARISON:  CT of the abdomen pelvis dated 12/22/2018. FINDINGS: Borderline dilated air-filled loops of small bowel in the upper abdomen may represent mild ileus. Clinical correlation is recommended. Stool noted throughout the colon. No free air identified. There is degenerative changes of the spine. Rounded density over the pelvis most consistent with blood clot full urinary bladder seen on the recent CT. IMPRESSION: 1. Borderline dilated air-filled loops of small bowel in the upper abdomen may represent mild ileus. 2. Density over the pelvis in keeping with blood filled urinary bladder. Electronically Signed   By: Anner Crete M.D.   On: 12/24/2018 23:27    Microbiology: Recent Results (from the past 240 hour(s))  Urine culture     Status: None   Collection Time: 12/22/18  8:16 PM   Specimen: Urine, Clean Catch  Result Value Ref Range Status   Specimen Description   Final    URINE, CLEAN CATCH Performed at [facility], [place] [address] [place], [place] [address]    Special Requests   Final    NONE Performed at [facility], [place] [address], [place], [place] [address]    Culture   Final    NO GROWTH Performed at [facility], [place] [address], [place], [place] [address]    Report Status 12/24/2018 FINAL  Final  Blood culture (routine x 2)     Status: None   Collection Time: 12/22/18  8:55 PM   Specimen: BLOOD  Result Value Ref Range Status   Specimen Description   Final    BLOOD BLOOD LEFT ARM Performed at [place] [address], [place], [place] [address]    Special Requests   Final    BOTTLES DRAWN AEROBIC AND ANAEROBIC Blood Culture adequate volume Performed at [place] [address] [place], [place] [address]    Culture   Final    NO GROWTH 5  DAYS Performed at [facility], [place] [address], [place], [place] [address]    Report Status 12/27/2018 FINAL  Final  Blood culture (routine x 2)     Status: None   Collection Time: 12/22/18  8:55 PM   Specimen: BLOOD  Result Value Ref Range Status   Specimen Description   Final    BLOOD LEFT ANTECUBITAL Performed at [place] [address], [place], [place] [address]    Special Requests   Final    BOTTLES DRAWN AEROBIC AND ANAEROBIC Blood Culture results may not be optimal due to an inadequate volume of blood received in culture bottles Performed at [place] [address] [place], [place] [address]    Culture   Final    NO GROWTH 5 DAYS Performed at [facility], [place] [address], [place],  [address]    Report Status 12/27/2018 FINAL  Final  SARS Coronavirus 2 [facility]  order, Performed in [facility] hospital lab) Nasopharyngeal Nasopharyngeal Swab     Status: None   Collection Time: 12/22/18  8:55 PM   Specimen: Nasopharyngeal Swab  Result Value Ref Range Status   SARS Coronavirus 2 NEGATIVE NEGATIVE Final    Comment: (NOTE) If result is NEGATIVE SARS-CoV-2 target nucleic acids are NOT DETECTED. The SARS-CoV-2 RNA is generally detectable in upper and lower  respiratory specimens during the acute phase of infection. The lowest  concentration of SARS-CoV-2 viral copies this assay can detect is 250  copies / mL. A negative result does not preclude SARS-CoV-2 infection  and should not be used as the sole basis for treatment or other  patient management decisions.  A negative result may occur with  improper specimen collection / handling, submission of specimen other  than nasopharyngeal swab, presence of viral mutation(s) within the  areas targeted by this assay, and inadequate number of viral copies  (<250 copies / mL). A negative result must be combined with clinical  observations, patient history, and  epidemiological information. If result is POSITIVE SARS-CoV-2 target nucleic acids are DETECTED. The SARS-CoV-2 RNA is generally detectable in upper and lower  respiratory specimens dur ing the acute phase of infection.  Positive  results are indicative of active infection with SARS-CoV-2.  Clinical  correlation with patient history and other diagnostic information is  necessary to determine patient infection status.  Positive results do  not rule out bacterial infection or co-infection with other viruses. If result is PRESUMPTIVE POSTIVE SARS-CoV-2 nucleic acids MAY BE PRESENT.   A presumptive positive result was obtained on the submitted specimen  and confirmed on repeat testing.  While 2019 novel coronavirus  (SARS-CoV-2) nucleic acids may be present in the submitted sample  additional confirmatory testing may be necessary for epidemiological  and / or clinical management purposes  to differentiate between  SARS-CoV-2 and other Sarbecovirus currently known to infect humans.  If clinically indicated additional testing with an alternate test  methodology [phone]) is advised. The SARS-CoV-2 RNA is generally  detectable in upper and lower respiratory sp ecimens during the acute  phase of infection. The expected result is Negative. Fact Sheet for Patients:  StrictlyIdeas.no Fact Sheet for Healthcare Providers: BankingDealers.co.za This test is not yet approved or cleared by the [place] FDA and has been authorized for detection and/or diagnosis of SARS-CoV-2 by FDA under an Emergency Use Authorization (EUA).  This EUA will remain in effect (meaning this test can be used) for the duration of the COVID-19 declaration under Section 564(b)(1) of the Act, 21 U.S.C. section 360bbb-3(b)(1), unless the authorization is terminated or revoked sooner. Performed at [facility], [place] [address], [place], [place] [address]     MRSA PCR Screening     Status: None   Collection Time: 12/23/18  2:19 AM   Specimen: Nasal Mucosa; Nasopharyngeal  Result Value Ref Range Status   MRSA by PCR NEGATIVE NEGATIVE Final    Comment:        The GeneXpert MRSA Assay (FDA approved for NASAL specimens only), is one component of a comprehensive MRSA colonization surveillance program. It is not intended to diagnose MRSA infection nor to guide or monitor treatment for MRSA infections. Performed at [facility], [place] [address] [place], [place] [address]      Labs: Basic Metabolic Panel: Recent Labs  Lab 12/27/18 0136 12/28/18 0149 12/29/18 0144 12/30/18 0544 12/31/18 0554  NA 140 139 140 140 140  K 3.8 3.6 4.2 4.0 4.0  CL 106 106 107 108 106  CO2 26 23 23  21* 25  GLUCOSE 113* 104* 98 96 108*  BUN 8 8 9 10  7*  CREATININE 0.82 0.77 0.79 0.77 0.86  CALCIUM 8.2* 8.1* 8.7* 8.3* 8.2*  MG  --  2.0 1.9 1.8 1.8   Liver Function Tests: No results for input(s): AST, ALT, ALKPHOS, BILITOT, PROT, ALBUMIN in the last 168 hours. No results for input(s): LIPASE, AMYLASE in the last 168 hours. No results for input(s): AMMONIA in the last 168 hours. CBC: Recent Labs  Lab 12/28/18 0149 12/29/18 0144 12/30/18 0544 12/31/18 0554 12/31/18 1404 01/01/19 0641  WBC 14.6* 14.7* 14.0* 11.9*  --  16.3*  HGB 8.4* 9.0* 8.4* 8.6* 9.0* 9.0*  HCT 27.5* 29.8* 27.7* 28.0* 29.3* 29.6*  MCV 95.8 96.1 95.2 95.2  --  94.9  PLT 289 301 340 297  --  293   Cardiac Enzymes: No results for input(s): CKTOTAL, CKMB, CKMBINDEX, TROPONINI in the last 168 hours. BNP: BNP (last 3 results) No results for input(s): BNP in the last 8760 hours.  ProBNP (last 3 results) No results for input(s): PROBNP in the last 8760 hours.  CBG: Recent Labs  Lab 12/26/18 1429  GLUCAP 120*       Signed:  Kayleen Memos, MD [facility] 01/01/2019, 11:11 AM

## 2019-01-01 NOTE — Progress Notes (Signed)
RN accompanied pt to ambulate in the hall for desaturation assessment. Patient tolerated well. O2 sats remained 90-96%.

## 2019-01-01 NOTE — Discharge Instructions (Addendum)
Benign Prostatic Hyperplasia  Benign prostatic hyperplasia (BPH) is an enlarged prostate gland that is caused by the normal aging process and not by cancer. The prostate is a walnut-sized gland that is involved in the production of semen. It is located in front of the rectum and below the bladder. The bladder stores urine and the urethra is the tube that carries the urine out of the body. The prostate may get bigger as a man gets older. An enlarged prostate can press on the urethra. This can make it harder to pass urine. The build-up of urine in the bladder can cause infection. Back pressure and infection may progress to bladder damage and kidney (renal) failure. What are the causes? This condition is part of a normal aging process. However, not all men develop problems from this condition. If the prostate enlarges away from the urethra, urine flow will not be blocked. If it enlarges toward the urethra and compresses it, there will be problems passing urine. What increases the risk? This condition is more likely to develop in men over the age of 75 years. What are the signs or symptoms? Symptoms of this condition include:  Getting up often during the night to urinate.  Needing to urinate frequently during the day.  Difficulty starting urine flow.  Decrease in size and strength of your urine stream.  Leaking (dribbling) after urinating.  Inability to pass urine. This needs immediate treatment.  Inability to completely empty your bladder.  Pain when you pass urine. This is more common if there is also an infection.  Urinary tract infection (UTI). How is this diagnosed? This condition is diagnosed based on your medical history, a physical exam, and your symptoms. Tests will also be done, such as:  A post-void bladder scan. This measures any amount of urine that may remain in your bladder after you finish urinating.  A digital rectal exam. In a rectal exam, your health care provider  checks your prostate by putting a lubricated, gloved finger into your rectum to feel the back of your prostate gland. This exam detects the size of your gland and any abnormal lumps or growths.  An exam of your urine (urinalysis).  A prostate specific antigen (PSA) screening. This is a blood test used to screen for prostate cancer.  An ultrasound. This test uses sound waves to electronically produce a picture of your prostate gland. Your health care provider may refer you to a specialist in kidney and prostate diseases (urologist). How is this treated? Once symptoms begin, your health care provider will monitor your condition (active surveillance or watchful waiting). Treatment for this condition will depend on the severity of your condition. Treatment may include:  Observation and yearly exams. This may be the only treatment needed if your condition and symptoms are mild.  Medicines to relieve your symptoms, including: ? Medicines to shrink the prostate. ? Medicines to relax the muscle of the prostate.  Surgery in severe cases. Surgery may include: ? Prostatectomy. In this procedure, the prostate tissue is removed completely through an open incision or with a laparoscope or robotics. ? Transurethral resection of the prostate (TURP). In this procedure, a tool is inserted through the opening at the tip of the penis (urethra). It is used to cut away tissue of the inner core of the prostate. The pieces are removed through the same opening of the penis. This removes the blockage. ? Transurethral incision (TUIP). In this procedure, small cuts are made in the prostate. This lessens  the prostate's pressure on the urethra. ? Transurethral microwave thermotherapy (TUMT). This procedure uses microwaves to create heat. The heat destroys and removes a small amount of prostate tissue. ? Transurethral needle ablation (TUNA). This procedure uses radio frequencies to destroy and remove a small amount of  prostate tissue. ? Interstitial laser coagulation ([place]). This procedure uses a laser to destroy and remove a small amount of prostate tissue. ? Transurethral electrovaporization (TUVP). This procedure uses electrodes to destroy and remove a small amount of prostate tissue. ? Prostatic urethral lift. This procedure inserts an implant to push the lobes of the prostate away from the urethra. Follow these instructions at home:  Take over-the-counter and prescription medicines only as told by your health care provider.  Monitor your symptoms for any changes. Contact your health care provider with any changes.  Avoid drinking large amounts of liquid before going to bed or out in public.  Avoid or reduce how much caffeine or alcohol you drink.  Give yourself time when you urinate.  Keep all follow-up visits as told by your health care provider. This is important. Contact a health care provider if:  You have unexplained back pain.  Your symptoms do not get better with treatment.  You develop side effects from the medicine you are taking.  Your urine becomes very dark or has a bad smell.  Your lower abdomen becomes distended and you have trouble passing your urine. Get help right away if:  You have a fever or chills.  You suddenly cannot urinate.  You feel lightheaded, or very dizzy, or you faint.  There are large amounts of blood or clots in the urine.  Your urinary problems become hard to manage.  You develop moderate to severe low back or flank pain. The flank is the side of your body between the ribs and the hip. These symptoms may represent a serious problem that is an emergency. Do not wait to see if the symptoms will go away. Get medical help right away. Call your local emergency services (911 in the [place] Do not drive yourself to the hospital. Summary  Benign prostatic hyperplasia (BPH) is an enlarged prostate that is caused by the normal aging process and not by  cancer.  An enlarged prostate can press on the urethra. This can make it hard to pass urine.  This condition is part of a normal aging process and is more likely to develop in men over the age of 90 years.  Get help right away if you suddenly cannot urinate. This information is not intended to replace advice given to you by your health care provider. Make sure you discuss any questions you have with your health care provider. Document Released: 04/05/2005 Document Revised: 02/28/2018 Document Reviewed: 05/10/2016 Elsevier Patient Education  [place]. Hematuria, Adult Hematuria is blood in the urine. Blood may be visible in the urine, or it may be identified with a test. This condition can be caused by infections of the bladder, urethra, kidney, or prostate. Other possible causes include:  Kidney stones.  Cancer of the urinary tract.  Too much calcium in the urine.  Conditions that are passed from parent to child (inherited conditions).  Exercise that requires a lot of energy. Infections can usually be treated with medicine, and a kidney stone usually will pass through your urine. If neither of these is the cause of your hematuria, more tests may be needed to identify the cause of your symptoms. It is very important to  tell your health care provider about any blood in your urine, even if it is painless or the blood stops without treatment. Blood in the urine, when it happens and then stops and then happens again, can be a symptom of a very serious condition, including cancer. There is no pain in the initial stages of many urinary cancers. Follow these instructions at home: Medicines  Take over-the-counter and prescription medicines only as told by your health care provider.  If you were prescribed an antibiotic medicine, take it as told by your health care provider. Do not stop taking the antibiotic even if you start to feel better. Eating and drinking  Drink enough fluid to  keep your urine clear or pale yellow. It is recommended that you drink 3-4 quarts (2.8-3.8 L) a day. If you have been diagnosed with an infection, it is recommended that you drink cranberry juice in addition to large amounts of water.  Avoid caffeine, tea, and carbonated beverages. These tend to irritate the bladder.  Avoid alcohol because it may irritate the prostate (men). General instructions  If you have been diagnosed with a kidney stone, follow your health care provider's instructions about straining your urine to catch the stone.  Empty your bladder often. Avoid holding urine for long periods of time.  If you are male: ? After a bowel movement, wipe from front to back and use each piece of toilet paper only once. ? Empty your bladder before and after sex.  Pay attention to any changes in your symptoms. Tell your health care provider about any changes or any new symptoms.  It is your responsibility to get your test results. Ask your health care provider, or the department performing the test, when your results will be ready.  Keep all follow-up visits as told by your health care provider. This is important. Contact a health care provider if:  You develop back pain.  You have a fever.  You have nausea or vomiting.  Your symptoms do not improve after 3 days.  Your symptoms get worse. Get help right away if:  You develop severe vomiting and are unable take medicine without vomiting.  You develop severe pain in your back or abdomen even though you are taking medicine.  You pass a large amount of blood in your urine.  You pass blood clots in your urine.  You feel very weak or like you might faint.  You faint. Summary  Hematuria is blood in the urine. It has many possible causes.  It is very important that you tell your health care provider about any blood in your urine, even if it is painless or the blood stops without treatment.  Take over-the-counter and  prescription medicines only as told by your health care provider.  Drink enough fluid to keep your urine clear or pale yellow. This information is not intended to replace advice given to you by your health care provider. Make sure you discuss any questions you have with your health care provider. Document Released: 04/05/2005 Document Revised: 03/18/2017 Document Reviewed: 05/08/2016 Elsevier Patient Education  [place].   Anemia  Anemia is a condition in which you do not have enough red blood cells or hemoglobin. Hemoglobin is a substance in red blood cells that carries oxygen. When you do not have enough red blood cells or hemoglobin (are anemic), your body cannot get enough oxygen and your organs may not work properly. As a result, you may feel very tired or have other  problems. What are the causes? Common causes of anemia include:  Excessive bleeding. Anemia can be caused by excessive bleeding inside or outside the body, including bleeding from the intestine or from periods in women.  Poor nutrition.  Long-lasting (chronic) kidney, thyroid, and liver disease.  Bone marrow disorders.  Cancer and treatments for cancer.  HIV (human immunodeficiency virus) and AIDS (acquired immunodeficiency syndrome).  Treatments for HIV and AIDS.  Spleen problems.  Blood disorders.  Infections, medicines, and autoimmune disorders that destroy red blood cells. What are the signs or symptoms? Symptoms of this condition include:  Minor weakness.  Dizziness.  Headache.  Feeling heartbeats that are irregular or faster than normal (palpitations).  Shortness of breath, especially with exercise.  Paleness.  Cold sensitivity.  Indigestion.  Nausea.  Difficulty sleeping.  Difficulty concentrating. Symptoms may occur suddenly or develop slowly. If your anemia is mild, you may not have symptoms. How is this diagnosed? This condition is diagnosed based on:  Blood  tests.  Your medical history.  A physical exam.  Bone marrow biopsy. Your health care provider may also check your stool (feces) for blood and may do additional testing to look for the cause of your bleeding. You may also have other tests, including:  Imaging tests, such as a CT scan or MRI.  Endoscopy.  Colonoscopy. How is this treated? Treatment for this condition depends on the cause. If you continue to lose a lot of blood, you may need to be treated at a hospital. Treatment may include:  Taking supplements of iron, vitamin T62, or folic acid.  Taking a hormone medicine (erythropoietin) that can help to stimulate red blood cell growth.  Having a blood transfusion. This may be needed if you lose a lot of blood.  Making changes to your diet.  Having surgery to remove your spleen. Follow these instructions at home:  Take over-the-counter and prescription medicines only as told by your health care provider.  Take supplements only as told by your health care provider.  Follow any diet instructions that you were given.  Keep all follow-up visits as told by your health care provider. This is important. Contact a health care provider if:  You develop new bleeding anywhere in the body. Get help right away if:  You are very weak.  You are short of breath.  You have pain in your abdomen or chest.  You are dizzy or feel faint.  You have trouble concentrating.  You have bloody or black, tarry stools.  You vomit repeatedly or you vomit up blood. Summary  Anemia is a condition in which you do not have enough red blood cells or enough of a substance in your red blood cells that carries oxygen (hemoglobin).  Symptoms may occur suddenly or develop slowly.  If your anemia is mild, you may not have symptoms.  This condition is diagnosed with blood tests as well as a medical history and physical exam. Other tests may be needed.  Treatment for this condition depends on  the cause of the anemia. This information is not intended to replace advice given to you by your health care provider. Make sure you discuss any questions you have with your health care provider. Document Released: 05/13/2004 Document Revised: 03/18/2017 Document Reviewed: 05/07/2016 Elsevier Patient Education  2020 [employer].

## 2019-01-01 NOTE — Progress Notes (Signed)
  Patient discharged to home. Discharge instruction including medications and follow-up appointments given to patient. Foley replacement bags provided to patient. Patient has no questions at this time.

## 2019-01-02 DIAGNOSIS — R338 Other retention of urine: Secondary | ICD-10-CM | POA: Diagnosis not present

## 2019-01-03 ENCOUNTER — Encounter: Payer: BC Managed Care – PPO | Admitting: Physical Therapy

## 2019-01-04 ENCOUNTER — Telehealth: Payer: Self-pay | Admitting: Interventional Cardiology

## 2019-01-04 NOTE — Telephone Encounter (Signed)
Spoke with Dr. Geannie Risen at [facility].  She states pt came in yesterday weak and breathing hard.  Recently in hospital with bleeding issues.  They discontinued his Eliquis and Diltiazem.  She performed an EKG and said it showed some QT prolongation.  She has ordered a CT Angio on the pt but really wanted him to be seen sooner in our office.  After hanging up I was unable to locate paperwork she sent over.  Spoke with pt and scheduled him to come in Tuesday for a visit.  Pt verbalized understanding and was in agreement with this plan.

## 2019-01-05 ENCOUNTER — Other Ambulatory Visit: Payer: Self-pay

## 2019-01-05 ENCOUNTER — Inpatient Hospital Stay (HOSPITAL_COMMUNITY)
Admission: EM | Admit: 2019-01-05 | Discharge: 2019-01-14 | DRG: 176 | Disposition: A | Discharge status: Home / Self Care | Payer: BC Managed Care – PPO | Attending: Family Medicine | Admitting: Family Medicine

## 2019-01-05 ENCOUNTER — Ambulatory Visit: Payer: BC Managed Care – PPO | Admitting: Physical Therapy

## 2019-01-05 ENCOUNTER — Encounter (HOSPITAL_COMMUNITY): Payer: Self-pay | Admitting: Emergency Medicine

## 2019-01-05 ENCOUNTER — Ambulatory Visit
Admission: RE | Admit: 2019-01-05 | Discharge: 2019-01-05 | Disposition: A | Discharge status: Home / Self Care | Payer: BC Managed Care – PPO | Source: Ambulatory Visit | Attending: Obstetrics and Gynecology | Admitting: Obstetrics and Gynecology

## 2019-01-05 ENCOUNTER — Telehealth: Payer: Self-pay

## 2019-01-05 ENCOUNTER — Other Ambulatory Visit: Payer: Self-pay | Admitting: Obstetrics and Gynecology

## 2019-01-05 DIAGNOSIS — N9989 Other postprocedural complications and disorders of genitourinary system: Secondary | ICD-10-CM | POA: Diagnosis present

## 2019-01-05 DIAGNOSIS — I10 Essential (primary) hypertension: Secondary | ICD-10-CM | POA: Diagnosis present

## 2019-01-05 DIAGNOSIS — I110 Hypertensive heart disease with heart failure: Secondary | ICD-10-CM | POA: Diagnosis present

## 2019-01-05 DIAGNOSIS — Z6827 Body mass index (BMI) 27.0-27.9, adult: Secondary | ICD-10-CM | POA: Diagnosis not present

## 2019-01-05 DIAGNOSIS — Z7901 Long term (current) use of anticoagulants: Secondary | ICD-10-CM | POA: Diagnosis not present

## 2019-01-05 DIAGNOSIS — Z20828 Contact with and (suspected) exposure to other viral communicable diseases: Secondary | ICD-10-CM | POA: Diagnosis not present

## 2019-01-05 DIAGNOSIS — Z96651 Presence of right artificial knee joint: Secondary | ICD-10-CM | POA: Diagnosis present

## 2019-01-05 DIAGNOSIS — I2694 Multiple subsegmental pulmonary emboli without acute cor pulmonale: Secondary | ICD-10-CM | POA: Diagnosis not present

## 2019-01-05 DIAGNOSIS — I82452 Acute embolism and thrombosis of left peroneal vein: Secondary | ICD-10-CM | POA: Diagnosis present

## 2019-01-05 DIAGNOSIS — I82441 Acute embolism and thrombosis of right tibial vein: Secondary | ICD-10-CM | POA: Diagnosis not present

## 2019-01-05 DIAGNOSIS — E669 Obesity, unspecified: Secondary | ICD-10-CM | POA: Diagnosis present

## 2019-01-05 DIAGNOSIS — G4733 Obstructive sleep apnea (adult) (pediatric): Secondary | ICD-10-CM | POA: Diagnosis not present

## 2019-01-05 DIAGNOSIS — R0602 Shortness of breath: Secondary | ICD-10-CM | POA: Diagnosis not present

## 2019-01-05 DIAGNOSIS — Z91030 Bee allergy status: Secondary | ICD-10-CM | POA: Diagnosis not present

## 2019-01-05 DIAGNOSIS — Z79891 Long term (current) use of opiate analgesic: Secondary | ICD-10-CM | POA: Diagnosis not present

## 2019-01-05 DIAGNOSIS — Z886 Allergy status to analgesic agent status: Secondary | ICD-10-CM

## 2019-01-05 DIAGNOSIS — I2699 Other pulmonary embolism without acute cor pulmonale: Principal | ICD-10-CM | POA: Diagnosis present

## 2019-01-05 DIAGNOSIS — R001 Bradycardia, unspecified: Secondary | ICD-10-CM | POA: Diagnosis not present

## 2019-01-05 DIAGNOSIS — I82403 Acute embolism and thrombosis of unspecified deep veins of lower extremity, bilateral: Secondary | ICD-10-CM | POA: Diagnosis present

## 2019-01-05 DIAGNOSIS — Z79899 Other long term (current) drug therapy: Secondary | ICD-10-CM

## 2019-01-05 DIAGNOSIS — Z87891 Personal history of nicotine dependence: Secondary | ICD-10-CM

## 2019-01-05 DIAGNOSIS — I824Z3 Acute embolism and thrombosis of unspecified deep veins of distal lower extremity, bilateral: Secondary | ICD-10-CM | POA: Diagnosis not present

## 2019-01-05 DIAGNOSIS — E785 Hyperlipidemia, unspecified: Secondary | ICD-10-CM | POA: Diagnosis present

## 2019-01-05 DIAGNOSIS — I371 Nonrheumatic pulmonary valve insufficiency: Secondary | ICD-10-CM | POA: Diagnosis not present

## 2019-01-05 DIAGNOSIS — R319 Hematuria, unspecified: Secondary | ICD-10-CM | POA: Diagnosis not present

## 2019-01-05 DIAGNOSIS — N401 Enlarged prostate with lower urinary tract symptoms: Secondary | ICD-10-CM | POA: Diagnosis not present

## 2019-01-05 DIAGNOSIS — E876 Hypokalemia: Secondary | ICD-10-CM | POA: Diagnosis not present

## 2019-01-05 DIAGNOSIS — Z85828 Personal history of other malignant neoplasm of skin: Secondary | ICD-10-CM

## 2019-01-05 DIAGNOSIS — R310 Gross hematuria: Secondary | ICD-10-CM | POA: Diagnosis not present

## 2019-01-05 DIAGNOSIS — I480 Paroxysmal atrial fibrillation: Secondary | ICD-10-CM | POA: Diagnosis present

## 2019-01-05 DIAGNOSIS — R338 Other retention of urine: Secondary | ICD-10-CM | POA: Diagnosis present

## 2019-01-05 DIAGNOSIS — E7800 Pure hypercholesterolemia, unspecified: Secondary | ICD-10-CM | POA: Diagnosis not present

## 2019-01-05 DIAGNOSIS — N400 Enlarged prostate without lower urinary tract symptoms: Secondary | ICD-10-CM | POA: Diagnosis present

## 2019-01-05 DIAGNOSIS — I5032 Chronic diastolic (congestive) heart failure: Secondary | ICD-10-CM | POA: Diagnosis not present

## 2019-01-05 DIAGNOSIS — Z8249 Family history of ischemic heart disease and other diseases of the circulatory system: Secondary | ICD-10-CM

## 2019-01-05 DIAGNOSIS — K219 Gastro-esophageal reflux disease without esophagitis: Secondary | ICD-10-CM | POA: Diagnosis present

## 2019-01-05 LAB — CBC WITH DIFFERENTIAL/PLATELET
Abs Immature Granulocytes: 0.08 10*3/uL — ABNORMAL HIGH (ref 0.00–0.07)
Basophils Absolute: 0.1 10*3/uL (ref 0.0–0.1)
Basophils Relative: 0 %
Eosinophils Absolute: 0.1 10*3/uL (ref 0.0–0.5)
Eosinophils Relative: 1 %
HCT: 32.6 % — ABNORMAL LOW (ref 39.0–52.0)
Hemoglobin: 9.7 g/dL — ABNORMAL LOW (ref 13.0–17.0)
Immature Granulocytes: 1 %
Lymphocytes Relative: 13 %
Lymphs Abs: 1.8 10*3/uL (ref 0.7–4.0)
MCH: 28 pg (ref 26.0–34.0)
MCHC: 29.8 g/dL — ABNORMAL LOW (ref 30.0–36.0)
MCV: 93.9 fL (ref 80.0–100.0)
Monocytes Absolute: 1.3 10*3/uL — ABNORMAL HIGH (ref 0.1–1.0)
Monocytes Relative: 9 %
Neutro Abs: 11.2 10*3/uL — ABNORMAL HIGH (ref 1.7–7.7)
Neutrophils Relative %: 76 %
Platelets: 354 10*3/uL (ref 150–400)
RBC: 3.47 MIL/uL — ABNORMAL LOW (ref 4.22–5.81)
RDW: 14.9 % (ref 11.5–15.5)
WBC: 14.6 10*3/uL — ABNORMAL HIGH (ref 4.0–10.5)
nRBC: 0 % (ref 0.0–0.2)

## 2019-01-05 LAB — URINALYSIS, ROUTINE W REFLEX MICROSCOPIC
Bacteria, UA: NONE SEEN
Bilirubin Urine: NEGATIVE
Glucose, UA: NEGATIVE mg/dL
Ketones, ur: NEGATIVE mg/dL
Nitrite: NEGATIVE
Protein, ur: 100 mg/dL — AB
RBC / HPF: >50 RBC/hpf — ABNORMAL HIGH (ref 0–5)
Specific Gravity, Urine: >1.046 — ABNORMAL HIGH (ref 1.005–1.030)
pH: 6 (ref 5.0–8.0)

## 2019-01-05 LAB — COMPREHENSIVE METABOLIC PANEL
ALT: 21 U/L (ref 0–44)
AST: 18 U/L (ref 15–41)
Albumin: 2.9 g/dL — ABNORMAL LOW (ref 3.5–5.0)
Alkaline Phosphatase: 56 U/L (ref 38–126)
Anion gap: 9 (ref 5–15)
BUN: 10 mg/dL (ref 8–23)
CO2: 21 mmol/L — ABNORMAL LOW (ref 22–32)
Calcium: 8.4 mg/dL — ABNORMAL LOW (ref 8.9–10.3)
Chloride: 109 mmol/L (ref 98–111)
Creatinine, Ser: 1.08 mg/dL (ref 0.61–1.24)
GFR calc Af Amer: >60 mL/min (ref >60)
GFR calc non Af Amer: >60 mL/min (ref >60)
Glucose, Bld: 104 mg/dL — ABNORMAL HIGH (ref 70–99)
Potassium: 3.9 mmol/L (ref 3.5–5.1)
Sodium: 139 mmol/L (ref 135–145)
Total Bilirubin: 0.4 mg/dL (ref 0.3–1.2)
Total Protein: 7 g/dL (ref 6.5–8.1)

## 2019-01-05 LAB — APTT: aPTT: 35 seconds (ref 24–36)

## 2019-01-05 LAB — PROTIME-INR
INR: 1.2 (ref 0.8–1.2)
Prothrombin Time: 15.4 seconds — ABNORMAL HIGH (ref 11.4–15.2)

## 2019-01-05 LAB — BRAIN NATRIURETIC PEPTIDE: B Natriuretic Peptide: 115.3 pg/mL — ABNORMAL HIGH (ref 0.0–100.0)

## 2019-01-05 LAB — TROPONIN I (HIGH SENSITIVITY): Troponin I (High Sensitivity): 11 ng/L (ref <18)

## 2019-01-05 MED ORDER — ACETAMINOPHEN 325 MG PO TABS
650.0000 mg | ORAL_TABLET | Freq: Four times a day (QID) | ORAL | Status: DC | PRN
  Administered 2019-01-05 – 2019-01-11 (×6): 650 mg via ORAL
  Filled 2019-01-05 (×7): qty 2

## 2019-01-05 MED ORDER — OXYCODONE HCL 5 MG PO TABS
5.0000 mg | ORAL_TABLET | Freq: Every day | ORAL | Status: DC | PRN
  Filled 2019-01-05: qty 1

## 2019-01-05 MED ORDER — HEPARIN BOLUS VIA INFUSION
2500.0000 [IU] | Freq: Once | INTRAVENOUS | Status: AC
  Administered 2019-01-05: 2500 [IU] via INTRAVENOUS
  Filled 2019-01-05: qty 2500

## 2019-01-05 MED ORDER — HEPARIN (PORCINE) 25000 UT/250ML-% IV SOLN
1400.0000 [IU]/h | INTRAVENOUS | Status: DC
  Administered 2019-01-05: 1400 [IU]/h via INTRAVENOUS
  Filled 2019-01-05: qty 250

## 2019-01-05 MED ORDER — ROSUVASTATIN CALCIUM 20 MG PO TABS
20.0000 mg | ORAL_TABLET | Freq: Every day | ORAL | Status: DC
  Administered 2019-01-05 – 2019-01-13 (×9): 20 mg via ORAL
  Filled 2019-01-05 (×9): qty 1

## 2019-01-05 MED ORDER — AMIODARONE HCL 200 MG PO TABS
200.0000 mg | ORAL_TABLET | Freq: Every day | ORAL | Status: DC
  Administered 2019-01-06 – 2019-01-14 (×9): 200 mg via ORAL
  Filled 2019-01-05 (×9): qty 1

## 2019-01-05 MED ORDER — SODIUM CHLORIDE 0.9 % IV SOLN
INTRAVENOUS | Status: AC
  Administered 2019-01-05: 75 mL/h via INTRAVENOUS

## 2019-01-05 MED ORDER — ONDANSETRON HCL 4 MG/2ML IJ SOLN: 4.0000 mg | Freq: Four times a day (QID) | INTRAMUSCULAR | Status: DC | PRN

## 2019-01-05 MED ORDER — CHLORHEXIDINE GLUCONATE CLOTH 2 % EX PADS
6.0000 | MEDICATED_PAD | Freq: Every day | CUTANEOUS | Status: DC
  Administered 2019-01-05 – 2019-01-14 (×10): 6 via TOPICAL

## 2019-01-05 MED ORDER — IOPAMIDOL (ISOVUE-370) INJECTION 76%
75.0000 mL | Freq: Once | INTRAVENOUS | Status: AC | PRN
  Administered 2019-01-05: 75 mL via INTRAVENOUS

## 2019-01-05 MED ORDER — ONDANSETRON HCL 4 MG PO TABS: 4.0000 mg | ORAL_TABLET | Freq: Four times a day (QID) | ORAL | Status: DC | PRN

## 2019-01-05 MED ORDER — PANTOPRAZOLE SODIUM 40 MG PO TBEC
40.0000 mg | DELAYED_RELEASE_TABLET | Freq: Every day | ORAL | Status: DC
  Administered 2019-01-05 – 2019-01-14 (×10): 40 mg via ORAL
  Filled 2019-01-05 (×10): qty 1

## 2019-01-05 MED ORDER — HYDROCODONE-ACETAMINOPHEN 5-325 MG PO TABS
1.0000 | ORAL_TABLET | ORAL | Status: DC | PRN
  Administered 2019-01-08 – 2019-01-11 (×2): 1 via ORAL
  Filled 2019-01-05 (×2): qty 1

## 2019-01-05 MED ORDER — ACETAMINOPHEN 650 MG RE SUPP: 650.0000 mg | Freq: Four times a day (QID) | RECTAL | Status: DC | PRN

## 2019-01-05 MED ORDER — HEPARIN (PORCINE) 25000 UT/250ML-% IV SOLN: 12.0000 [IU]/kg/h | INTRAVENOUS | Status: DC

## 2019-01-05 MED ORDER — FINASTERIDE 5 MG PO TABS
5.0000 mg | ORAL_TABLET | Freq: Every day | ORAL | Status: DC
  Administered 2019-01-06 – 2019-01-14 (×9): 5 mg via ORAL
  Filled 2019-01-05 (×9): qty 1

## 2019-01-05 MED ORDER — METHOCARBAMOL 500 MG PO TABS
500.0000 mg | ORAL_TABLET | Freq: Four times a day (QID) | ORAL | Status: DC | PRN
  Administered 2019-01-10 (×2): 500 mg via ORAL
  Filled 2019-01-05 (×3): qty 1

## 2019-01-05 MED ORDER — SULFAMETHOXAZOLE-TRIMETHOPRIM 800-160 MG PO TABS
1.0000 | ORAL_TABLET | Freq: Two times a day (BID) | ORAL | Status: DC
  Administered 2019-01-05 – 2019-01-08 (×6): 1 via ORAL
  Filled 2019-01-05 (×6): qty 1

## 2019-01-05 NOTE — Telephone Encounter (Signed)
Addendum:  I spoke with Dr Laural Benes today.  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  She has sent him to [facility] for admission.  Cardiology is available for consultation should the inpatient team request.  Thompson Grayer MD, [facility] 01/05/2019 4:46 PM
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
## 2019-01-05 NOTE — Telephone Encounter (Signed)
NOTES ON FILE FROM DR Jake Michaelis OFFICE AND SCANNED INTO EPIC

## 2019-01-05 NOTE — Progress Notes (Signed)
Per nursing, Philip Jackson, pt. Has refused use of Bipap and states that he does not need.  Will be available if he changes his mind.
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
## 2019-01-05 NOTE — ED Notes (Signed)
Per PCP-recently discharged from step down-states he was on [place] for Afib-did not restart once he returned home-urology did not want him to restart-had a CTA done which shows multiple PE's in both lobes-sending here to evaluation and treatment
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
## 2019-01-06 ENCOUNTER — Inpatient Hospital Stay (HOSPITAL_COMMUNITY): Payer: BC Managed Care – PPO

## 2019-01-06 DIAGNOSIS — E876 Hypokalemia: Secondary | ICD-10-CM

## 2019-01-06 DIAGNOSIS — I2699 Other pulmonary embolism without acute cor pulmonale: Secondary | ICD-10-CM

## 2019-01-06 DIAGNOSIS — I371 Nonrheumatic pulmonary valve insufficiency: Secondary | ICD-10-CM

## 2019-01-06 LAB — COMPREHENSIVE METABOLIC PANEL
ALT: 18 U/L (ref 0–44)
AST: 16 U/L (ref 15–41)
Albumin: 2.8 g/dL — ABNORMAL LOW (ref 3.5–5.0)
Alkaline Phosphatase: 52 U/L (ref 38–126)
Anion gap: 7 (ref 5–15)
BUN: 9 mg/dL (ref 8–23)
CO2: 21 mmol/L — ABNORMAL LOW (ref 22–32)
Calcium: 8.1 mg/dL — ABNORMAL LOW (ref 8.9–10.3)
Chloride: 112 mmol/L — ABNORMAL HIGH (ref 98–111)
Creatinine, Ser: 0.97 mg/dL (ref 0.61–1.24)
GFR calc Af Amer: >60 mL/min (ref >60)
GFR calc non Af Amer: >60 mL/min (ref >60)
Glucose, Bld: 95 mg/dL (ref 70–99)
Potassium: 3.4 mmol/L — ABNORMAL LOW (ref 3.5–5.1)
Sodium: 140 mmol/L (ref 135–145)
Total Bilirubin: 0.5 mg/dL (ref 0.3–1.2)
Total Protein: 6.3 g/dL — ABNORMAL LOW (ref 6.5–8.1)

## 2019-01-06 LAB — MAGNESIUM: Magnesium: 2.2 mg/dL (ref 1.7–2.4)

## 2019-01-06 LAB — CBC
HCT: 29.3 % — ABNORMAL LOW (ref 39.0–52.0)
Hemoglobin: 8.9 g/dL — ABNORMAL LOW (ref 13.0–17.0)
MCH: 28.3 pg (ref 26.0–34.0)
MCHC: 30.4 g/dL (ref 30.0–36.0)
MCV: 93 fL (ref 80.0–100.0)
Platelets: 320 10*3/uL (ref 150–400)
RBC: 3.15 MIL/uL — ABNORMAL LOW (ref 4.22–5.81)
RDW: 14.9 % (ref 11.5–15.5)
WBC: 11.9 10*3/uL — ABNORMAL HIGH (ref 4.0–10.5)
nRBC: 0 % (ref 0.0–0.2)

## 2019-01-06 LAB — TSH: TSH: 1.654 u[IU]/mL (ref 0.350–4.500)

## 2019-01-06 LAB — SARS CORONAVIRUS 2 (TAT 6-24 HRS): SARS Coronavirus 2: NEGATIVE

## 2019-01-06 LAB — TROPONIN I (HIGH SENSITIVITY): Troponin I (High Sensitivity): 9 ng/L (ref <18)

## 2019-01-06 LAB — ECHOCARDIOGRAM COMPLETE
Height: 69 in
Weight: 2864.22 oz

## 2019-01-06 LAB — HEPARIN LEVEL (UNFRACTIONATED)
Heparin Unfractionated: 0.23 IU/mL — ABNORMAL LOW (ref 0.30–0.70)
Heparin Unfractionated: 0.5 IU/mL (ref 0.30–0.70)
Heparin Unfractionated: 0.48 IU/mL (ref 0.30–0.70)

## 2019-01-06 LAB — PHOSPHORUS: Phosphorus: 4.1 mg/dL (ref 2.5–4.6)

## 2019-01-06 MED ORDER — POLYETHYL GLYCOL-PROPYL GLYCOL 0.4-0.3 % OP SOLN: 1.0000 [drp] | Freq: Every day | OPHTHALMIC | Status: DC | PRN

## 2019-01-06 MED ORDER — HEPARIN (PORCINE) 25000 UT/250ML-% IV SOLN
1600.0000 [IU]/h | INTRAVENOUS | Status: DC
  Administered 2019-01-06: 1600 [IU]/h via INTRAVENOUS
  Filled 2019-01-06 (×2): qty 250

## 2019-01-06 MED ORDER — CLOBETASOL PROPIONATE 0.05 % EX CREA
1.0000 "application " | TOPICAL_CREAM | Freq: Two times a day (BID) | CUTANEOUS | Status: DC | PRN
  Filled 2019-01-06: qty 15

## 2019-01-06 MED ORDER — FERROUS SULFATE 325 (65 FE) MG PO TABS
325.0000 mg | ORAL_TABLET | ORAL | Status: DC
  Administered 2019-01-09 – 2019-01-11 (×2): 325 mg via ORAL
  Filled 2019-01-06 (×2): qty 1

## 2019-01-06 MED ORDER — POTASSIUM CHLORIDE CRYS ER 20 MEQ PO TBCR
40.0000 meq | EXTENDED_RELEASE_TABLET | Freq: Once | ORAL | Status: AC
  Administered 2019-01-06: 40 meq via ORAL
  Filled 2019-01-06: qty 2

## 2019-01-06 MED ORDER — POLYVINYL ALCOHOL 1.4 % OP SOLN
1.0000 [drp] | Freq: Every day | OPHTHALMIC | Status: DC | PRN
  Filled 2019-01-06: qty 15

## 2019-01-06 MED ORDER — SENNOSIDES-DOCUSATE SODIUM 8.6-50 MG PO TABS
1.0000 | ORAL_TABLET | Freq: Two times a day (BID) | ORAL | Status: DC
  Administered 2019-01-06 – 2019-01-14 (×16): 1 via ORAL
  Filled 2019-01-06 (×16): qty 1

## 2019-01-06 NOTE — Progress Notes (Signed)
PROGRESS NOTE    Philip Jackson  [id number] DOB: 07-15-1950 DOA: 01/05/2019 PCP: Philip Sake, MD    Brief Narrative:  HPI per Dr. [name] is a 68 y.o. 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 Also had acute kidney injury with hematuria and urine retention. At that time urology saw the patient and he was discharged home. Of note patient had recent UTI diagnosis an outpatient was pansensitive he completed 5 days of Keflex HE returned with hypotension due to hematuria and needed emergent blood transfusion as there was 5 g drop in hemoglobin with frank hematuria. CT scan showed bladder mass versus clot. Urology was consulted and continuous bladder irrigation started. Hospital course complicated by atrial fibrillation with RVR therefore cardiology consulted. Cystoscopy, completed on 12/29/2018 with clot evacuation   He was discharged home on 9/14 at the time of discharge Hg 9.0 Continue Foley catheter, and finasteride. He was no longer on Eliquis  Since then he had worsening shortness of breath family states he was shortness of breath even prior to discharge No leg swelling, no chest pain  No fever  Urology have seen him as an outpatient he was able to urinate on his own  He still got a foley catheter  His last coloscopy was 68 yo and was normal  He quit smoking in 10 years   Assessment & Plan:   Principal Problem:   Bilateral pulmonary embolism ([place]) Active Problems:   HTN (hypertension)   HLD (hyperlipidemia)   OSA treated with BiPAP   Postoperative urinary  retention   PAF (paroxysmal atrial fibrillation) ([place])   Pulmonary embolism ([facility])   Hypokalemia  1 multiple bilateral pulmonary emboli Per CT angiogram which was done prior to presentation to the ED.  Patient with no overt bleeding this morning.  Foley catheter clear.  2D echo and lower extremity Dopplers done and pending. Risk factors for PE include hypercoagulable state, recent surgery, prolonged illness with decreased mobility.  Patient with no signs of hypotension.  Patient with prior history of severe hematuria requiring emergent transfusion during prior hospitalization with evacuation of clot noted in the bladder.  Continue IV heparin for now and monitor closely for rebleeding.  If patient does have rebleeding may need IVC filter placement.  Will follow for now.  2.  Hypertension Patient is Toprol on hold.  Blood pressure currently stable at 121/80.  Heart rate of 74.  Monitor.  3.  OSA  BiPAP nightly.  4.  Hypokalemia Replete.  5.  Hyperlipidemia Continue statin.  6.  Paroxysmal atrial fibrillation Currently rate controlled on amiodarone.  Continue to hold Toprol for now and likely resume the next 24 hours if blood pressure remains stable.  Patient on IV heparin secondary to problem #1.  Patient's Eliquis was discontinued during recent prior hospitalization due to significant hematuria.  Monitor closely for bleeding.  Follow.  7.  Postop urinary retention Status post Foley catheter placement.  Continue finasteride.  Patient was recently started on Bactrim given persistent leukocytosis with WBC trending down.  Repeat UA done.  Will check urine  cultures.  Continue Bactrim for now.    8. History of chronic diastolic CHF Patient noted to be on the dry side on admission.  Patient on gentle hydration.  Monitor closely for volume overload.  Continue to hold Toprol-XL for now likely resume in the next 24 hours.  Follow.   DVT prophylaxis: Heparin Code Status: Full Family  Communication: Updated patient.  No family at bedside. Disposition Plan: Remain in stepdown unit.   Consultants:   None  Procedures:   2D echo pending 01/06/2019  Lower extremity Dopplers pending 01/06/2019    Antimicrobials:  Bactrim 01/05/2019 was started preadmission.   Subjective: Patient laying in bed.  Denies any bleeding.  States feels some improvement with shortness of breath since admission however has not ambulated in order to determine whether it significantly better.  Denies any chest pain.  Objective: Vitals:   01/06/19 0500 01/06/19 0600 01/06/19 0800 01/06/19 0900  BP: 111/68 111/73  121/80  Pulse: 60 63  74  Resp: 18 17  (!) 25  Temp:   97.7 F (36.5 C)   TempSrc:   Oral   SpO2: 99% 99%  99%  Weight:      Height:        Intake/Output Summary (Last 24 hours) at 01/06/2019 0956 Last data filed at 01/06/2019 0645 Gross per 24 hour  Intake 455.79 ml  Output 800 ml  Net -344.21 ml   Filed Weights   01/05/19 1900 01/05/19 2300  Weight: 85.7 kg 81.2 kg    Examination:  General exam: Appears calm and comfortable  Respiratory system: Clear to auscultation. Respiratory effort normal. Cardiovascular system: S1 & S2 heard, RRR. No JVD, murmurs, rubs, gallops or clicks. No pedal edema. Gastrointestinal system: Abdomen is nondistended, soft and nontender. No organomegaly or masses felt. Normal bowel sounds heard. Central nervous system: Alert and oriented. No focal neurological deficits. Extremities: Symmetric 5 x 5 power. Skin: No rashes, lesions or ulcers Psychiatry: Judgement and insight appear normal. Mood & affect appropriate.     Data Reviewed: I have personally reviewed following labs and imaging studies  CBC: Recent Labs  Lab 12/31/18 0554 12/31/18 1404 01/01/19 0641 01/05/19 1730 01/06/19 0157  WBC 11.9*  --  16.3* 14.6* 11.9*  NEUTROABS  --   --   --  11.2*  --   HGB 8.6* 9.0* 9.0* 9.7* 8.9*  HCT 28.0* 29.3* 29.6* 32.6* 29.3*  MCV  95.2  --  94.9 93.9 93.0  PLT 297  --  293 354 [id number]   Basic Metabolic Panel: Recent Labs  Lab 12/31/18 0554 01/05/19 1730 01/06/19 0157  NA 140 139 140  K 4.0 3.9 3.4*  CL 106 109 112*  CO2 25 21* 21*  GLUCOSE 108* 104* 95  BUN 7* 10 9  CREATININE 0.86 1.08 0.97  CALCIUM 8.2* 8.4* 8.1*  MG 1.8  --  2.2  PHOS  --   --  4.1   GFR: Estimated Creatinine Clearance: 72.9 mL/min (by C-G formula based on SCr of 0.97 mg/dL). Liver Function Tests: Recent Labs  Lab 01/05/19 1730 01/06/19 0157  AST 18 16  ALT 21 18  ALKPHOS 56 52  BILITOT 0.4 0.5  PROT 7.0 6.3*  ALBUMIN 2.9* 2.8*   No results for input(s): LIPASE, AMYLASE in the last 168 hours. No results for input(s): AMMONIA in the last 168 hours. Coagulation Profile: Recent Labs  Lab 01/05/19 1730  INR 1.2   Cardiac Enzymes: No results for input(s): CKTOTAL, CKMB,  CKMBINDEX, TROPONINI in the last 168 hours. BNP (last 3 results) No results for input(s): PROBNP in the last 8760 hours. HbA1C: No results for input(s): HGBA1C in the last 72 hours. CBG: No results for input(s): GLUCAP in the last 168 hours. Lipid Profile: No results for input(s): CHOL, HDL, LDLCALC, TRIG, CHOLHDL, LDLDIRECT in the last 72 hours. Thyroid Function Tests: Recent Labs    01/06/19 0157  TSH 1.654   Anemia Panel: No results for input(s): VITAMINB12, FOLATE, FERRITIN, TIBC, IRON, RETICCTPCT in the last 72 hours. Sepsis Labs: No results for input(s): PROCALCITON, LATICACIDVEN in the last 168 hours.  Recent Results (from the past 240 hour(s))  SARS CORONAVIRUS 2 (TAT 6-24 HRS) Nasopharyngeal Nasopharyngeal Swab     Status: None   Collection Time: 01/05/19  6:20 PM   Specimen: Nasopharyngeal Swab  Result Value Ref Range Status   SARS Coronavirus 2 NEGATIVE NEGATIVE Final    Comment: (NOTE) SARS-CoV-2 target nucleic acids are NOT DETECTED. The SARS-CoV-2 RNA is generally detectable in upper and lower respiratory specimens during the acute  phase of infection. Negative results do not preclude SARS-CoV-2 infection, do not rule out co-infections with other pathogens, and should not be used as the sole basis for treatment or other patient management decisions. Negative results must be combined with clinical observations, patient history, and epidemiological information. The expected result is Negative. Fact Sheet for Patients: SugarRoll.be Fact Sheet for Healthcare Providers: https://www.woods-mathews.com/ This test is not yet approved or cleared by the [place] FDA and  has been authorized for detection and/or diagnosis of SARS-CoV-2 by FDA under an Emergency Use Authorization (EUA). This EUA will remain  in effect (meaning this test can be used) for the duration of the COVID-19 declaration under Section 56 4(b)(1) of the Act, 21 U.S.C. section 360bbb-3(b)(1), unless the authorization is terminated or revoked sooner. Performed at [facility], [place] [address] [place], [place] [address]          Radiology Studies: Ct Angio Chest Pe W Or Wo Contrast  Result Date: 01/05/2019 CLINICAL DATA:  Shortness of breath. Dyspnea on exertion. EXAM: CT ANGIOGRAPHY CHEST WITH CONTRAST TECHNIQUE: Multidetector CT imaging of the chest was performed using the standard protocol during bolus administration of intravenous contrast. Multiplanar CT image reconstructions and MIPs were obtained to evaluate the vascular anatomy. CONTRAST:  37mL ISOVUE-370 IOPAMIDOL (ISOVUE-370) INJECTION 76% COMPARISON:  Chest x-ray dated 12/22/2018 FINDINGS: Cardiovascular: There are multiple bilateral pulmonary emboli to all 3 lobes on the right and both lobes on the left. RV LV ratio is normal. The heart size is normal. No pericardial effusion. Mediastinum/Nodes: No enlarged mediastinal, hilar, or axillary lymph nodes. Thyroid gland, trachea, and esophagus demonstrate no significant findings. Lungs/Pleura: Tiny  bilateral pleural effusions. Minimal atelectasis at the lung bases posteriorly. Lungs are otherwise clear. Upper Abdomen: Normal. Musculoskeletal: No chest wall abnormality. No acute or significant osseous findings. Review of the MIP images confirms the above findings. IMPRESSION: 1. Multiple bilateral pulmonary emboli. 2. Tiny bilateral pleural effusions. Critical Value/emergent results were called by telephone at the time of interpretation on 01/05/2019 at 4:25 pm to providerKATHLEEN ZELLER , who verbally acknowledged these results. The patient was advised to go straight to the [facility] emergency room for treatment. Electronically Signed   By: Lorriane Shire M.D.   On: 01/05/2019 16:29        Scheduled Meds:  amiodarone  200 mg Oral Daily   Chlorhexidine Gluconate Cloth  6 each Topical Daily   [  START ON 01/09/2019] ferrous sulfate  325 mg Oral Once per day on Tue Thu   finasteride  5 mg Oral Daily   pantoprazole  40 mg Oral Daily   rosuvastatin  20 mg Oral QHS   senna-docusate  1 tablet Oral BID   sulfamethoxazole-trimethoprim  1 tablet Oral BID   Continuous Infusions:  heparin 1,600 Units/hr (01/06/19 0341)     LOS: 1 day    Time spent: 45 minutes    Irine Seal, MD [facility]  If 7PM-7AM, please contact night-coverage [email or web address] 01/06/2019, 9:56 AM

## 2019-01-06 NOTE — Progress Notes (Signed)
  Echocardiogram 2D Echocardiogram has been performed.  Philip Jackson 01/06/2019, 9:49 AM

## 2019-01-06 NOTE — Progress Notes (Signed)
Pt had short period of pink tinged urine, but it cleared up after an hour.  Leg bag changed to standard drainage bag on admission to unit per charge RN

## 2019-01-06 NOTE — Progress Notes (Signed)
Brief Pharmacy Consult Note - IV heparin  Labs: heparin level 0.48  A/P: heparin level continues to be therapeutic (goal 0.3-0.7) on current IV heparin rate of 1600 units/hr. No reported bleeding. Continue current heparin rate and will recheck labs in AM.   Adrian Saran, PharmD, BCPS 01/06/2019 6:51 PM
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
## 2019-01-06 NOTE — Telephone Encounter (Signed)
Thanks

## 2019-01-06 NOTE — Progress Notes (Signed)
OT Cancellation Note  Patient Details Name: Philip Jackson MRN: [id number] DOB: 14-May-1950   Cancelled Treatment:    Reason Eval/Treat Not Completed: Medical issues which prohibited therapy.  Order received. Noted pt found to have multiple bil PEs yesterday afternoon.  Will check back tomorrow (after 24 hours from dx) and see him when therapeutic.  [place] 01/06/2019, 7:07 AM  Lesle Chris, OTR/L Acute Rehabilitation Services [phone] [facility] pager [phone] office 01/06/2019

## 2019-01-06 NOTE — Progress Notes (Signed)
PT Cancellation Note  Patient Details Name: Philip Jackson MRN: [id number] DOB: 06/26/1950   Cancelled Treatment:     PT order received but eval deferred. Noted pt found to have multiple bil PEs yesterday afternoon.  Will check back tomorrow (after 24 hours from dx) and see him when therapeutic.   Kessie Croston 01/06/2019, 7:30 AM

## 2019-01-06 NOTE — Progress Notes (Signed)
LE venous duplex       has been completed. Preliminary results can be found under CV proc through chart review. Christepher Melchior, BS, RDMS, RVT   

## 2019-01-07 ENCOUNTER — Encounter (HOSPITAL_COMMUNITY): Payer: Self-pay | Admitting: *Deleted

## 2019-01-07 DIAGNOSIS — I82403 Acute embolism and thrombosis of unspecified deep veins of lower extremity, bilateral: Secondary | ICD-10-CM | POA: Diagnosis present

## 2019-01-07 DIAGNOSIS — I824Z3 Acute embolism and thrombosis of unspecified deep veins of distal lower extremity, bilateral: Secondary | ICD-10-CM

## 2019-01-07 LAB — BASIC METABOLIC PANEL
Anion gap: 9 (ref 5–15)
BUN: 9 mg/dL (ref 8–23)
CO2: 20 mmol/L — ABNORMAL LOW (ref 22–32)
Calcium: 8.4 mg/dL — ABNORMAL LOW (ref 8.9–10.3)
Chloride: 111 mmol/L (ref 98–111)
Creatinine, Ser: 0.83 mg/dL (ref 0.61–1.24)
GFR calc Af Amer: >60 mL/min (ref >60)
GFR calc non Af Amer: >60 mL/min (ref >60)
Glucose, Bld: 97 mg/dL (ref 70–99)
Potassium: 3.9 mmol/L (ref 3.5–5.1)
Sodium: 140 mmol/L (ref 135–145)

## 2019-01-07 LAB — URINE CULTURE: Culture: NO GROWTH

## 2019-01-07 LAB — CBC
HCT: 30.2 % — ABNORMAL LOW (ref 39.0–52.0)
Hemoglobin: 9 g/dL — ABNORMAL LOW (ref 13.0–17.0)
MCH: 28 pg (ref 26.0–34.0)
MCHC: 29.8 g/dL — ABNORMAL LOW (ref 30.0–36.0)
MCV: 93.8 fL (ref 80.0–100.0)
Platelets: 361 10*3/uL (ref 150–400)
RBC: 3.22 MIL/uL — ABNORMAL LOW (ref 4.22–5.81)
RDW: 14.7 % (ref 11.5–15.5)
WBC: 11.2 10*3/uL — ABNORMAL HIGH (ref 4.0–10.5)
nRBC: 0 % (ref 0.0–0.2)

## 2019-01-07 LAB — HEPARIN LEVEL (UNFRACTIONATED)
Heparin Unfractionated: 0.75 IU/mL — ABNORMAL HIGH (ref 0.30–0.70)
Heparin Unfractionated: 0.73 IU/mL — ABNORMAL HIGH (ref 0.30–0.70)

## 2019-01-07 MED ORDER — HEPARIN (PORCINE) 25000 UT/250ML-% IV SOLN: 1450.0000 [IU]/h | INTRAVENOUS | Status: DC

## 2019-01-07 MED ORDER — HEPARIN (PORCINE) 25000 UT/250ML-% IV SOLN
1400.0000 [IU]/h | INTRAVENOUS | Status: DC
  Administered 2019-01-07 – 2019-01-08 (×2): 1300 [IU]/h via INTRAVENOUS
  Administered 2019-01-09 – 2019-01-11 (×2): 1400 [IU]/h via INTRAVENOUS
  Filled 2019-01-07 (×5): qty 250

## 2019-01-07 MED ORDER — METOPROLOL SUCCINATE ER 25 MG PO TB24
100.0000 mg | ORAL_TABLET | Freq: Every day | ORAL | Status: DC
  Administered 2019-01-07: 100 mg via ORAL
  Filled 2019-01-07: qty 4
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
## 2019-01-07 NOTE — Progress Notes (Signed)
ANTICOAGULATION CONSULT NOTE - Consult  Pharmacy Consult for IV UFH Indication: pulmonary embolus  Allergies  Allergen Reactions  . 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 Nsaids     Duodenal ulcer.  anticoagulated    Patient Measurements: Height: 5\' 9"  (175.3 cm) Weight: 179 lb 0.2 oz (81.2 kg) IBW/kg (Calculated) : 70.7 Heparin Dosing Weight: 85.7  Vital Signs: Temp: 98.3 F (36.8 C) (09/20 0800) Temp Source: Oral (09/20 0400) BP: 120/63 (09/20 1100) Pulse Rate: 81 (09/20 1100)  Labs: Recent Labs    01/05/19 1730 01/05/19 2202 01/06/19 0157  01/06/19 1651 01/07/19 0225 01/07/19 1208  HGB 9.7*  --  8.9*  --   --  9.0*  --   HCT 32.6*  --  29.3*  --   --  30.2*  --   PLT 354  --  320  --   --  361  --   APTT 35  --   --   --   --   --   --   LABPROT 15.4*  --   --   --   --   --   --   INR 1.2  --   --   --   --   --   --   HEPARINUNFRC  --   --  0.23*   < > 0.48 0.75* 0.73*  CREATININE 1.08  --  0.97  --   --  0.83  --   TROPONINIHS  --  11 9  --   --   --   --    < > = values in this interval not displayed.    Estimated Creatinine Clearance: 85.2 mL/min (by C-G formula based on SCr of 0.83 mg/dL).     Assessment: 68 yo male with multiple PE's to start IV heparin per Rx dosing. Was previously on apixaban for hx Afib but was being held due to hospital admission for acute blood loss anemia/hemorrhagic shock secondary to hematuria. Baseline labs obtained Today, 9/19 1208 HL = 0.73 slightly above goal after rate decreased to 1450 units/hr, no bleeding or infusion issues per RN.  H/H, plts stable  Goal of Therapy:  Heparin level 0.3-0.7 units/ml Monitor platelets by anticoagulation protocol: Yes   Plan:   Decrease heparin drip to 1300 units/hr  Check heparin level 8 hours after heparin increase  Daily CBC and heparin level  Eudelia Bunch, Pharm.D [phone] 01/07/2019 1:23 PM

## 2019-01-07 NOTE — Evaluation (Signed)
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    Clinical Impression  Pt admitted as above and presenting with functional mobility limitations 2* generalized weakness and mild ambulatory balance deficits.  Pt should progress to dc home with family assist and would benefit from continued OP PT to further address recent R TKR limitations.    Follow Up Recommendations Outpatient PT    Equipment Recommendations  None recommended by PT    Recommendations for Other Services       Precautions / Restrictions Precautions Precautions: Fall Restrictions Weight Bearing Restrictions: No RLE Weight Bearing: Weight bearing as tolerated      Mobility  Bed Mobility Overal bed mobility: Modified Independent             General bed mobility comments: Pt unassisted to sitting EOB  Transfers Overall transfer level: Needs assistance Equipment used: Rolling walker (2 wheeled) Transfers: Sit to/from Stand Sit to Stand: Min guard         General transfer comment: cues for hand placement with steady assist  Ambulation/Gait Ambulation/Gait assistance: Min guard Gait Distance (Feet): 200 Feet Assistive device: Rolling walker (2 wheeled) Gait Pattern/deviations: Step-through pattern Gait velocity: decr   General Gait Details: min cues for posture, pace and position from [employer]            Wheelchair Mobility    Modified Rankin (Stroke Patients Only)       Balance Overall balance assessment: Mild deficits observed, not formally tested                                           Pertinent Vitals/Pain Pain Assessment: No/denies pain    Home Living Family/patient expects to be discharged to:: Private residence Living Arrangements: Spouse/significant  other Available Help at Discharge: Family;Available 24 hours/day Type of Home: House Home Access: Stairs to enter Entrance Stairs-Rails: Psychiatric nurse of Steps: 3 Home Layout: One level Home Equipment: Walker - 2 wheels;Bedside commode;Cane - single point      Prior Function Level of Independence: Independent with assistive device(s)         Comments: RW or cane since TKA     Hand Dominance   Dominant Hand: Right    Extremity/Trunk Assessment   Upper Extremity Assessment Upper Extremity Assessment: Generalized weakness    Lower Extremity Assessment Lower Extremity Assessment: Generalized weakness       Communication   Communication: No difficulties  Cognition Arousal/Alertness: Awake/alert Behavior During Therapy: WFL for tasks assessed/performed Overall Cognitive Status: Within Functional Limits for tasks assessed                                 General Comments: flat affect, hx of CHI      General Comments      Exercises     Assessment/Plan    PT Assessment Patient needs continued PT services  PT Problem List Decreased strength;Decreased range of motion;Decreased activity tolerance;Decreased mobility;Pain;Decreased knowledge of use of DME       PT Treatment Interventions Functional mobility training;Stair training;Balance training;Gait training;Therapeutic exercise;Patient/family education;Therapeutic activities;DME instruction    PT Goals (  Current goals can be found in the Care Plan section)  Acute Rehab PT Goals Patient Stated Goal: return to independence with mobility PT Goal Formulation: With patient/family Time For Goal Achievement: 12/15/18 Potential to Achieve Goals: Good    Frequency Min 3X/week   Barriers to discharge        Co-evaluation               AM-PAC PT "6 Clicks" Mobility  Outcome Measure Help needed turning from your back to your side while in a flat bed without using bedrails?:  None Help needed moving from lying on your back to sitting on the side of a flat bed without using bedrails?: None Help needed moving to and from a bed to a chair (including a wheelchair)?: A Little Help needed standing up from a chair using your arms (e.g., wheelchair or bedside chair)?: A Little Help needed to walk in hospital room?: A Little Help needed climbing 3-5 steps with a railing? : A Little 6 Click Score: 20    End of Session Equipment Utilized During Treatment: Gait belt Activity Tolerance: Patient limited by fatigue;Patient tolerated treatment well Patient left: in chair;with call bell/phone within reach;with family/visitor present Nurse Communication: Mobility status PT Visit Diagnosis: Difficulty in walking, not elsewhere classified (R26.2)    Time: [id number] PT Time Calculation (min) (ACUTE ONLY): 24 min   Charges:   PT Evaluation $PT Eval Low Complexity: 1 Low PT Treatments $Gait Training: 8-22 mins        [place] Pager [phone] Office [phone]   Krisa Blattner 01/07/2019, 3:21 PM

## 2019-01-07 NOTE — Progress Notes (Signed)
PROGRESS NOTE    Philip Jackson  [id number] DOB: 1950/06/13 DOA: 01/05/2019 PCP: Leonides Sake, MD    Brief Narrative:  HPI per Dr. Luan Moore Jackson is a 68 y.o. 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 Also had acute kidney injury with hematuria and urine retention. At that time urology saw the patient and he was discharged home. Of note patient had recent UTI diagnosis an outpatient was pansensitive he completed 5 days of Keflex HE returned with hypotension due to hematuria and needed emergent blood transfusion as there was 5 g drop in hemoglobin with frank hematuria. CT scan showed bladder mass versus clot. Urology was consulted and continuous bladder irrigation started. Hospital course complicated by atrial fibrillation with RVR therefore cardiology consulted. Cystoscopy, completed on 12/29/2018 with clot evacuation   He was discharged home on 9/14 at the time of discharge Hg 9.0 Continue Foley catheter, and finasteride. He was no longer on Eliquis  Since then he had worsening shortness of breath family states he was shortness of breath even prior to discharge No leg swelling, no chest pain  No fever  Urology have seen him as an outpatient he was able to urinate on his own  He still got a foley catheter  His last coloscopy was 68 yo and was normal  He quit smoking in 10 years   Assessment & Plan:   Principal Problem:   Bilateral pulmonary embolism (HCC) Active Problems:   DVT of lower extremity, bilateral ([place])   HTN (hypertension)   HLD (hyperlipidemia)   OSA  treated with BiPAP   Postoperative urinary retention   PAF (paroxysmal atrial fibrillation) ([place])   Pulmonary embolism (HCC)   Hypokalemia  1 multiple bilateral pulmonary emboli Per CT angiogram which was done prior to presentation to the ED.  Patient with no overt bleeding this morning.  Foley catheter clear.  2D echo with EF of 60 to 65%, mildly increased LVH, left ventricular diastolic Doppler parameters consistent with impaired relaxation pattern of LV diastolic filling, global right ventricle has moderately reduced systolic function, right ventricular size severely enlarged.  Lower extremity Dopplers consistent with bilateral DVT. Risk factors for PE include hypercoagulable state, recent surgery, prolonged illness with decreased mobility.  Patient with no signs of hypotension.  Patient with prior history of severe hematuria requiring emergent transfusion during prior hospitalization with evacuation of clot noted in the bladder.  Continue IV heparin for now and monitor closely for rebleeding.  If patient does rebleed may need IVC filter placement.  Will likely need to consider oral anticoagulation that can easily be reversed due to recent history of bleeding.  Will follow for now.  2.  Hypertension Patient's Toprol on hold.  Blood pressure currently stable at 130/75.  Heart rate of 78.  Resume home regimen Toprol and monitor.  Monitor.  3.  OSA  BiPAP nightly.  4.  Hypokalemia Repleted.  5.  Hyperlipidemia Continue statin.  6.  Paroxysmal atrial fibrillation Currently rate controlled on amiodarone.  Blood pressure currently stable.  Resume home regimen Toprol XL for now.  Continue  IV heparin secondary to problem #1.  Patient's Eliquis was discontinued during recent prior hospitalization due to significant hematuria.  Monitor closely for bleeding.  Follow.  7.  Postop urinary retention Status post Foley catheter placement.  Continue finasteride.  Patient was recently started on Bactrim  given persistent leukocytosis with WBC trending down.  Repeat UA done with moderate leukocytes, nitrite negative, 21-50 WBCs.  Urine cultures with no growth to date.  Continue Bactrim for now.    8. History of chronic diastolic CHF Patient noted to be on the dry side on admission.  Patient was on gentle hydration which is subsequently been discontinued.  Monitor closely for volume overload.  Resume home regimen Toprol-XL.  Follow.    DVT prophylaxis: Heparin Code Status: Full Family Communication: Updated patient.  No family at bedside. Disposition Plan: Remain in stepdown unit.   Consultants:   None  Procedures:   2D echo 01/06/2019  Lower extremity Dopplers 01/06/2019    Antimicrobials:  Bactrim 01/05/2019 was started preadmission.   Subjective: Patient sleeping however easily arousable.  Denies any worsening shortness of breath at rest.  Denies any chest pain.  Denies any bleeding.  Hesitant to get up to ambulate as he states his wife feels he needs to be completely at rest due to the clots.  Foley catheter in place with clear urine.  Objective: Vitals:   01/07/19 0500 01/07/19 0600 01/07/19 0700 01/07/19 0800  BP: 122/78 (!) 145/77 126/86 130/75  Pulse: 66 71 97 78  Resp: 17 19 (!) 24 20  Temp:    98.3 F (36.8 C)  TempSrc:      SpO2: 99% 97% 99% 94%  Weight:      Height:        Intake/Output Summary (Last 24 hours) at 01/07/2019 0927 Last data filed at 01/07/2019 [id number] Gross per 24 hour  Intake 1029.16 ml  Output 2125 ml  Net -1095.84 ml   Filed Weights   01/05/19 1900 01/05/19 2300  Weight: 85.7 kg 81.2 kg    Examination:  General exam: Appears calm and comfortable  Respiratory system: Lungs clear to auscultation bilaterally.  No wheezes, no crackles, no rhonchi.  Normal respiratory effort.  Speaking in full sentences.   Cardiovascular system: Regular rate rhythm no murmurs rubs or gallops.  No JVD.  No lower extremity edema.  Gastrointestinal system:  Abdomen is soft, nontender, nondistended, positive bowel sounds.  No rebound.  No guarding. Central nervous system: Alert and oriented. No focal neurological deficits. Extremities: Symmetric 5 x 5 power. Skin: No rashes, lesions or ulcers Psychiatry: Judgement and insight appear normal. Mood & affect appropriate.     Data Reviewed: I have personally reviewed following labs and imaging studies  CBC: Recent Labs  Lab 12/31/18 1404 01/01/19 0641 01/05/19 1730 01/06/19 0157 01/07/19 0225  WBC  --  16.3* 14.6* 11.9* 11.2*  NEUTROABS  --   --  11.2*  --   --   HGB 9.0* 9.0* 9.7* 8.9* 9.0*  HCT 29.3* 29.6* 32.6* 29.3* 30.2*  MCV  --  94.9 93.9 93.0 93.8  PLT  --  293 354 320 [id number]   Basic Metabolic Panel: Recent Labs  Lab 01/05/19 1730 01/06/19 0157 01/07/19 0225  NA 139 140 140  K 3.9 3.4* 3.9  CL 109 112* 111  CO2 21* 21* 20*  GLUCOSE 104* 95 97  BUN 10 9 9   CREATININE 1.08 0.97 0.83  CALCIUM 8.4* 8.1* 8.4*  MG  --  2.2  --  PHOS  --  4.1  --    GFR: Estimated Creatinine Clearance: 85.2 mL/min (by C-G formula based on SCr of 0.83 mg/dL). Liver Function Tests: Recent Labs  Lab 01/05/19 1730 01/06/19 0157  AST 18 16  ALT 21 18  ALKPHOS 56 52  BILITOT 0.4 0.5  PROT 7.0 6.3*  ALBUMIN 2.9* 2.8*   No results for input(s): LIPASE, AMYLASE in the last 168 hours. No results for input(s): AMMONIA in the last 168 hours. Coagulation Profile: Recent Labs  Lab 01/05/19 1730  INR 1.2   Cardiac Enzymes: No results for input(s): CKTOTAL, CKMB, CKMBINDEX, TROPONINI in the last 168 hours. BNP (last 3 results) No results for input(s): PROBNP in the last 8760 hours. HbA1C: No results for input(s): HGBA1C in the last 72 hours. CBG: No results for input(s): GLUCAP in the last 168 hours. Lipid Profile: No results for input(s): CHOL, HDL, LDLCALC, TRIG, CHOLHDL, LDLDIRECT in the last 72 hours. Thyroid Function Tests: Recent Labs    01/06/19 0157  TSH 1.654   Anemia  Panel: No results for input(s): VITAMINB12, FOLATE, FERRITIN, TIBC, IRON, RETICCTPCT in the last 72 hours. Sepsis Labs: No results for input(s): PROCALCITON, LATICACIDVEN in the last 168 hours.  Recent Results (from the past 240 hour(s))  SARS CORONAVIRUS 2 (TAT 6-24 HRS) Nasopharyngeal Nasopharyngeal Swab     Status: None   Collection Time: 01/05/19  6:20 PM   Specimen: Nasopharyngeal Swab  Result Value Ref Range Status   SARS Coronavirus 2 NEGATIVE NEGATIVE Final    Comment: (NOTE) SARS-CoV-2 target nucleic acids are NOT DETECTED. The SARS-CoV-2 RNA is generally detectable in upper and lower respiratory specimens during the acute phase of infection. Negative results do not preclude SARS-CoV-2 infection, do not rule out co-infections with other pathogens, and should not be used as the sole basis for treatment or other patient management decisions. Negative results must be combined with clinical observations, patient history, and epidemiological information. The expected result is Negative. Fact Sheet for Patients: SugarRoll.be Fact Sheet for Healthcare Providers: https://www.woods-mathews.com/ This test is not yet approved or cleared by the [place] FDA and  has been authorized for detection and/or diagnosis of SARS-CoV-2 by FDA under an Emergency Use Authorization (EUA). This EUA will remain  in effect (meaning this test can be used) for the duration of the COVID-19 declaration under Section 56 4(b)(1) of the Act, 21 U.S.C. section 360bbb-3(b)(1), unless the authorization is terminated or revoked sooner. Performed at [facility], [place] [address] [place], [place] [address]          Radiology Studies: Ct Angio Chest Pe W Or Wo Contrast  Result Date: 01/05/2019 CLINICAL DATA:  Shortness of breath. Dyspnea on exertion. EXAM: CT ANGIOGRAPHY CHEST WITH CONTRAST TECHNIQUE: Multidetector CT imaging of the chest was performed  using the standard protocol during bolus administration of intravenous contrast. Multiplanar CT image reconstructions and MIPs were obtained to evaluate the vascular anatomy. CONTRAST:  38mL ISOVUE-370 IOPAMIDOL (ISOVUE-370) INJECTION 76% COMPARISON:  Chest x-ray dated 12/22/2018 FINDINGS: Cardiovascular: There are multiple bilateral pulmonary emboli to all 3 lobes on the right and both lobes on the left. RV LV ratio is normal. The heart size is normal. No pericardial effusion. Mediastinum/Nodes: No enlarged mediastinal, hilar, or axillary lymph nodes. Thyroid gland, trachea, and esophagus demonstrate no significant findings. Lungs/Pleura: Tiny bilateral pleural effusions. Minimal atelectasis at the lung bases posteriorly. Lungs are otherwise clear. Upper Abdomen: Normal. Musculoskeletal: No chest wall abnormality. No acute or significant  osseous findings. Review of the MIP images confirms the above findings. IMPRESSION: 1. Multiple bilateral pulmonary emboli. 2. Tiny bilateral pleural effusions. Critical Value/emergent results were called by telephone at the time of interpretation on 01/05/2019 at 4:25 pm to providerKATHLEEN ZELLER , who verbally acknowledged these results. The patient was advised to go straight to the [facility] emergency room for treatment. Electronically Signed   By: Lorriane Shire M.D.   On: 01/05/2019 16:29   Vas [place] Lower Extremity Venous (dvt)  Result Date: 01/06/2019  Lower Venous Study Indications: Pulmonary embolism.  Comparison Study: no prior Performing Technologist: June Leap RDMS, RVT  Examination Guidelines: A complete evaluation includes B-mode imaging, spectral Doppler, color Doppler, and power Doppler as needed of all accessible portions of each vessel. Bilateral testing is considered an integral part of a complete examination. Limited examinations for reoccurring indications may be performed as noted.   +---------+---------------+---------+-----------+----------+-------------------+ RIGHT    CompressibilityPhasicitySpontaneityPropertiesThrombus Aging      +---------+---------------+---------+-----------+----------+-------------------+ CFV      Full           Yes      Yes                                      +---------+---------------+---------+-----------+----------+-------------------+ SFJ      Full                                                             +---------+---------------+---------+-----------+----------+-------------------+ FV Prox  Full                                                             +---------+---------------+---------+-----------+----------+-------------------+ FV Mid   Full                                                             +---------+---------------+---------+-----------+----------+-------------------+ FV DistalFull                                                             +---------+---------------+---------+-----------+----------+-------------------+ PFV      Full                                                             +---------+---------------+---------+-----------+----------+-------------------+ POP      Full           Yes      Yes                                      +---------+---------------+---------+-----------+----------+-------------------+  PTV      None                                         isolated DVT                                                              proximal segment    +---------+---------------+---------+-----------+----------+-------------------+ PERO     Full                                                             +---------+---------------+---------+-----------+----------+-------------------+   +---------+---------------+---------+-----------+----------+------------------+ LEFT     CompressibilityPhasicitySpontaneityPropertiesThrombus Aging      +---------+---------------+---------+-----------+----------+------------------+ CFV      Full           Yes      Yes                                     +---------+---------------+---------+-----------+----------+------------------+ SFJ      Full                                                            +---------+---------------+---------+-----------+----------+------------------+ FV Prox  Full                                                            +---------+---------------+---------+-----------+----------+------------------+ FV Mid   Full                                                            +---------+---------------+---------+-----------+----------+------------------+ FV DistalFull                                                            +---------+---------------+---------+-----------+----------+------------------+ PFV      Full                                                            +---------+---------------+---------+-----------+----------+------------------+ POP      Full  Yes      Yes                                     +---------+---------------+---------+-----------+----------+------------------+ PTV      Full                                                            +---------+---------------+---------+-----------+----------+------------------+ PERO     None                                         isolated DVT mid                                                         segment            +---------+---------------+---------+-----------+----------+------------------+     Summary: Right: Findings consistent with acute deep vein thrombosis involving the right posterior tibial veins. A cystic structure is found in the popliteal fossa. Left: Findings consistent with acute deep vein thrombosis involving the left peroneal veins. No cystic structure found in the popliteal fossa.  *See table(s) above for measurements  and observations.    Preliminary         Scheduled Meds: . amiodarone  200 mg Oral Daily  . Chlorhexidine Gluconate Cloth  6 each Topical Daily  . [START ON 01/09/2019] ferrous sulfate  325 mg Oral Once per day on Tue Thu  . finasteride  5 mg Oral Daily  . pantoprazole  40 mg Oral Daily  . rosuvastatin  20 mg Oral QHS  . senna-docusate  1 tablet Oral BID  . sulfamethoxazole-trimethoprim  1 tablet Oral BID   Continuous Infusions: . heparin 1,450 Units/hr (01/07/19 0450)     LOS: 2 days    Time spent: 40 minutes    Irine Seal, MD [facility]  If 7PM-7AM, please contact night-coverage [email or web address] 01/07/2019, 9:27 AM

## 2019-01-08 ENCOUNTER — Ambulatory Visit: Payer: BC Managed Care – PPO | Admitting: Cardiology

## 2019-01-08 LAB — CBC
HCT: 30.2 % — ABNORMAL LOW (ref 39.0–52.0)
Hemoglobin: 9.3 g/dL — ABNORMAL LOW (ref 13.0–17.0)
MCH: 28.4 pg (ref 26.0–34.0)
MCHC: 30.8 g/dL (ref 30.0–36.0)
MCV: 92.1 fL (ref 80.0–100.0)
Platelets: 362 10*3/uL (ref 150–400)
RBC: 3.28 MIL/uL — ABNORMAL LOW (ref 4.22–5.81)
RDW: 14.8 % (ref 11.5–15.5)
WBC: 10.7 10*3/uL — ABNORMAL HIGH (ref 4.0–10.5)
nRBC: 0 % (ref 0.0–0.2)

## 2019-01-08 LAB — BASIC METABOLIC PANEL
Anion gap: 9 (ref 5–15)
BUN: 10 mg/dL (ref 8–23)
CO2: 20 mmol/L — ABNORMAL LOW (ref 22–32)
Calcium: 8.5 mg/dL — ABNORMAL LOW (ref 8.9–10.3)
Chloride: 110 mmol/L (ref 98–111)
Creatinine, Ser: 0.94 mg/dL (ref 0.61–1.24)
GFR calc Af Amer: >60 mL/min (ref >60)
GFR calc non Af Amer: >60 mL/min (ref >60)
Glucose, Bld: 110 mg/dL — ABNORMAL HIGH (ref 70–99)
Potassium: 4.3 mmol/L (ref 3.5–5.1)
Sodium: 139 mmol/L (ref 135–145)

## 2019-01-08 LAB — PROTIME-INR
INR: 1.2 (ref 0.8–1.2)
Prothrombin Time: 14.8 seconds (ref 11.4–15.2)

## 2019-01-08 LAB — HEPARIN LEVEL (UNFRACTIONATED)
Heparin Unfractionated: 0.46 IU/mL (ref 0.30–0.70)
Heparin Unfractionated: 0.36 IU/mL (ref 0.30–0.70)

## 2019-01-08 LAB — MAGNESIUM: Magnesium: 2.1 mg/dL (ref 1.7–2.4)

## 2019-01-08 MED ORDER — WARFARIN - PHARMACIST DOSING INPATIENT: Freq: Every day | Status: DC

## 2019-01-08 MED ORDER — SULFAMETHOXAZOLE-TRIMETHOPRIM 800-160 MG PO TABS
1.0000 | ORAL_TABLET | Freq: Two times a day (BID) | ORAL | Status: AC
  Administered 2019-01-08 – 2019-01-13 (×11): 1 via ORAL
  Filled 2019-01-08 (×11): qty 1

## 2019-01-08 MED ORDER — WARFARIN SODIUM 5 MG PO TABS
7.5000 mg | ORAL_TABLET | Freq: Once | ORAL | Status: AC
  Administered 2019-01-08: 7.5 mg via ORAL
  Filled 2019-01-08: qty 1

## 2019-01-08 NOTE — Progress Notes (Signed)
ANTICOAGULATION CONSULT NOTE - Consult  Pharmacy Consult for IV UFH Indication: pulmonary embolus  Allergies  Allergen Reactions  . 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 Nsaids     Duodenal ulcer.  anticoagulated    Patient Measurements: Height: 5\' 9"  (175.3 cm) Weight: 179 lb 0.2 oz (81.2 kg) IBW/kg (Calculated) : 70.7 Heparin Dosing Weight: 85.7  Vital Signs: Temp: 98 F (36.7 C) (09/21 0000) Temp Source: Oral (09/21 0000) BP: 94/54 (09/21 0200) Pulse Rate: 51 (09/21 0200)  Labs: Recent Labs    01/05/19 1730 01/05/19 2202 01/06/19 0157  01/07/19 0225 01/07/19 1208 01/08/19 0242  HGB 9.7*  --  8.9*  --  9.0*  --  9.3*  HCT 32.6*  --  29.3*  --  30.2*  --  30.2*  PLT 354  --  320  --  361  --  362  APTT 35  --   --   --   --   --   --   LABPROT 15.4*  --   --   --   --   --   --   INR 1.2  --   --   --   --   --   --   HEPARINUNFRC  --   --  0.23*   < > 0.75* 0.73* 0.46  CREATININE 1.08  --  0.97  --  0.83  --  0.94  TROPONINIHS  --  11 9  --   --   --   --    < > = values in this interval not displayed.    Estimated Creatinine Clearance: 75.2 mL/min (by C-G formula based on SCr of 0.94 mg/dL).     Assessment: 68 yo male with multiple PE's to start IV heparin per Rx dosing. Was previously on apixaban for hx Afib but was being held due to hospital admission for acute blood loss anemia/hemorrhagic shock secondary to hematuria. Baseline labs obtained Today, 9/21 0242 HL = 0.46 at goal, no bleeding or infusion issues reported by RN.   H/H, plts stable  Goal of Therapy:  Heparin level 0.3-0.7 units/ml Monitor platelets by anticoagulation protocol: Yes   Plan:   Continue heparin drip at 1300  Units/hr  Recheck confirmatory level to ensure stays in range  Daily CBC and heparin level   Dorrene German 01/08/2019 4:28 AM

## 2019-01-08 NOTE — Evaluation (Signed)
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     Clinical Impression   Pt admitted with multiple PE and LE DVT. Pt currently with functional limitations due to the deficits listed below (see OT Problem List).  Pt will benefit from skilled OT to increase their safety and independence with ADL and functional mobility for ADL to facilitate discharge to venue listed below. Pt very motivated to get OOB for dinner for does report being nervous about moving around.  Assured him it was ok     Follow Up Recommendations  Supervision/Assistance - 24 hour    Equipment Recommendations  3 in 1 bedside commode    Recommendations for Other Services       Precautions / Restrictions Precautions Precautions: Fall Restrictions Weight Bearing Restrictions: No RLE Weight Bearing: Weight bearing as tolerated      Mobility Bed Mobility Overal bed mobility: Modified Independent             General bed mobility comments: Increased time to perform  Transfers Overall transfer level: Needs assistance Equipment used: Rolling walker (2 wheeled) Transfers: Sit to/from Stand Sit to Stand: Min guard;From elevated surface         General transfer comment: Min guard for safety, verbal cuing for hand placement. Pt with self-steadying upon standing.    Balance Overall balance assessment: Mild deficits observed, not formally tested                                         ADL either performed or assessed with clinical judgement   ADL Overall ADL's : Needs assistance/impaired Eating/Feeding: Set up;Sitting   Grooming: Set up;Sitting   Upper Body Bathing: Set up;Sitting   Lower Body Bathing: Sit to/from stand;Cueing for sequencing;Cueing for safety;Cueing for compensatory  techniques;Minimal assistance   Upper Body Dressing : Set up;Sitting   Lower Body Dressing: Sit to/from stand;Cueing for safety;Cueing for sequencing;Cueing for compensatory techniques;Minimal assistance   Toilet Transfer: Minimal assistance;Stand-pivot;RW;Cueing for sequencing;Cueing for safety Toilet Transfer Details (indicate cue type and reason): bed to chair Toileting- Clothing Manipulation and Hygiene: Min guard;Sit to/from stand;Cueing for compensatory techniques;Cueing for sequencing;Cueing for safety       Functional mobility during ADLs: Minimal assistance;Rolling walker                    Pertinent Vitals/Pain Pain Assessment: No/denies pain     Hand Dominance Right   Extremity/Trunk Assessment Upper Extremity Assessment Upper Extremity Assessment: Overall WFL for tasks assessed           Communication Communication Communication: No difficulties   Cognition Arousal/Alertness: Awake/alert Behavior During Therapy: WFL for tasks assessed/performed Overall Cognitive Status: Within Functional Limits for tasks assessed                                                Home Living Family/patient expects to be discharged to:: Private residence Living Arrangements: Spouse/significant other Available Help at Discharge: Family;Available 24 hours/day Type of Home: House Home Access: Stairs to enter [employer] of Steps: 3 Entrance Stairs-Rails:  Right;Left Home Layout: One level     Bathroom Shower/Tub: Tub/shower unit;Walk-in shower   Bathroom Toilet: Standard Bathroom Accessibility: Yes   Home Equipment: Walker - 2 wheels;Bedside commode;Cane - single point          Prior Functioning/Environment Level of Independence: Independent with assistive device(s)        Comments: RW or cane since TKA        OT Problem List: Decreased strength;Decreased activity tolerance      OT Treatment/Interventions: Self-care/ADL  training;Patient/family education;Therapeutic activities    OT Goals(Current goals can be found in the care plan section) Acute Rehab OT Goals Patient Stated Goal: return to independence with mobility OT Goal Formulation: With patient Time For Goal Achievement: 01/15/19 Potential to Achieve Goals: Good  OT Frequency: Min 2X/week    AM-PAC OT "6 Clicks" Daily Activity     Outcome Measure Help from another person eating meals?: None Help from another person taking care of personal grooming?: None Help from another person toileting, which includes using toliet, bedpan, or urinal?: A Little Help from another person bathing (including washing, rinsing, drying)?: A Little Help from another person to put on and taking off regular upper body clothing?: None Help from another person to put on and taking off regular lower body clothing?: A Little 6 Click Score: 21   End of Session Equipment Utilized During Treatment: Rolling walker;Gait belt Nurse Communication: Mobility status  Activity Tolerance: Patient tolerated treatment well Patient left: in chair;with call bell/phone within reach  OT Visit Diagnosis: Unsteadiness on feet (R26.81);Other abnormalities of gait and mobility (R26.89);Muscle weakness (generalized) (M62.81)                Time: [id number] OT Time Calculation (min): 18 min Charges:  OT General Charges $OT Visit: 1 Visit OT Evaluation $OT Eval Moderate Complexity: 1 Mod  Kari Baars, OT Acute Rehabilitation Services [phone] Office- [phone], Edwena Felty D 01/08/2019, 5:09 PM

## 2019-01-08 NOTE — Progress Notes (Signed)
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      PT Comments    Pt with good ambulation progression this session, limited somewhat by mild R knee pain. Pt concerned about maintaining R TKR and progressing, but pt assured he has good ROM and strength. PT encouraged pt to perform quad sets for full knee extension and heel slides for full ROM when returned to supine. Pt also concerned about "moving my clots" during PT today, but pt assured of his therapeutic anti-coagulation levels and safety for mobility. PT to continue to follow acutely.    Follow Up Recommendations  Outpatient PT     Equipment Recommendations  None recommended by PT    Recommendations for Other Services       Precautions / Restrictions Precautions Precautions: Fall Restrictions Weight Bearing Restrictions: No RLE Weight Bearing: Weight bearing as tolerated    Mobility  Bed Mobility Overal bed mobility: Modified Independent             General bed mobility comments: Increased time to perform  Transfers Overall transfer level: Needs assistance Equipment used: Rolling walker (2 wheeled) Transfers: Sit to/from Stand Sit to Stand: Min guard;From elevated surface         General transfer comment: Min guard for safety, verbal cuing for hand placement. Pt with self-steadying upon standing.  Ambulation/Gait Ambulation/Gait assistance: Min guard Gait Distance (Feet): 450 Feet Assistive device: Rolling walker (2 wheeled) Gait Pattern/deviations: Step-through pattern;Decreased stride length;Antalgic Gait velocity: slightly decr   General Gait Details: Min guard to supervision for safety, pt with slight antalgic gait due to mild R knee pain.   Stairs             Wheelchair Mobility    Modified  Rankin (Stroke Patients Only)       Balance Overall balance assessment: Mild deficits observed, not formally tested                                          Cognition Arousal/Alertness: Awake/alert Behavior During Therapy: WFL for tasks assessed/performed Overall Cognitive Status: Within Functional Limits for tasks assessed                                 General Comments: flat affect, hx of CHI      Exercises Total Joint Exercises Quad Sets: AROM;15 reps;Seated;Right Heel Slides: AAROM;Right;10 reps;Seated Long Arc Quad: AROM;Right;15 reps;Seated Goniometric ROM: knee AROM ~5-120*, limited by pain    General Comments        Pertinent Vitals/Pain Pain Assessment: 0-10 Pain Score: 2  Pain Location: R knee, posterior Pain Descriptors / Indicators: Sore Pain Intervention(s): Limited activity within patient's tolerance;Monitored during session;Repositioned    Home Living                      Prior Function            PT Goals (current goals can now be found in the care plan section) Acute Rehab PT Goals Patient Stated Goal: return to independence with mobility PT Goal Formulation: With patient/family Time For Goal Achievement: 12/15/18 Potential to  Achieve Goals: Good Progress towards PT goals: Progressing toward goals    Frequency    Min 3X/week      PT Plan Current plan remains appropriate    Co-evaluation              AM-PAC PT "6 Clicks" Mobility   Outcome Measure  Help needed turning from your back to your side while in a flat bed without using bedrails?: None Help needed moving from lying on your back to sitting on the side of a flat bed without using bedrails?: None Help needed moving to and from a bed to a chair (including a wheelchair)?: A Little Help needed standing up from a chair using your arms (e.g., wheelchair or bedside chair)?: A Little Help needed to walk in hospital room?: A Little Help  needed climbing 3-5 steps with a railing? : A Little 6 Click Score: 20    End of Session Equipment Utilized During Treatment: Gait belt Activity Tolerance: Patient tolerated treatment well Patient left: in chair;with call bell/phone within reach;with family/visitor present;with chair alarm set Nurse Communication: Mobility status PT Visit Diagnosis: Difficulty in walking, not elsewhere classified (R26.2)     Time: [id number] PT Time Calculation (min) (ACUTE ONLY): 32 min  Charges:  $Gait Training: 8-22 mins $Therapeutic Exercise: 8-22 mins                     Julien Girt, PT Acute Rehabilitation Services Pager [phone]  Office [phone]    Teaghan Formica D Elonda Husky 01/08/2019, 12:41 PM

## 2019-01-08 NOTE — Progress Notes (Signed)
PROGRESS NOTE    Philip Jackson  [id number] DOB: [birth year] DOA: 01/05/2019 PCP: Philip Sake, MD    Brief Narrative:  HPI per Dr. Luan Moore [name] is a 68 y.o. 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 Also had acute kidney injury with hematuria and urine retention. At that time urology saw the patient and he was discharged home. Of note patient had recent UTI diagnosis an outpatient was pansensitive he completed 5 days of Keflex HE returned with hypotension due to hematuria and needed emergent blood transfusion as there was 5 g drop in hemoglobin with frank hematuria. CT scan showed bladder mass versus clot. Urology was consulted and continuous bladder irrigation started. Hospital course complicated by atrial fibrillation with RVR therefore cardiology consulted. Cystoscopy, completed on 12/29/2018 with clot evacuation   He was discharged home on 9/14 at the time of discharge Hg 9.0 Continue Foley catheter, and finasteride. He was no longer on Eliquis  Since then he had worsening shortness of breath family states he was shortness of breath even prior to discharge No leg swelling, no chest pain  No fever  Urology have seen him as an outpatient he was able to urinate on his own  He still got a foley catheter  His last coloscopy was 68 yo and was normal  He quit smoking in 10 years   Assessment & Plan:   Principal Problem:   Bilateral pulmonary embolism (HCC) Active Problems:   DVT of lower extremity, bilateral ([place])   HTN (hypertension)   HLD (hyperlipidemia)   OSA  treated with BiPAP   Postoperative urinary retention   PAF (paroxysmal atrial fibrillation) ([place])   Pulmonary embolism (HCC)   Hypokalemia  1 multiple bilateral pulmonary emboli/bilateral lower extremity DVT. Per CT angiogram which was done prior to presentation to the ED.  Patient with no overt bleeding this morning.  Foley catheter clear.  2D echo with EF of 60 to 65%, mildly increased LVH, left ventricular diastolic Doppler parameters consistent with impaired relaxation pattern of LV diastolic filling, global right ventricle has moderately reduced systolic function, right ventricular size severely enlarged.  Lower extremity Dopplers consistent with bilateral DVT. Risk factors for PE include hypercoagulable state, recent surgery, prolonged illness with decreased mobility.  Patient with no signs of hypotension.  Patient with prior history of severe hematuria requiring emergent transfusion during prior hospitalization with evacuation of clot noted in the bladder.  Continue IV heparin for now and monitor closely for rebleeding.  If patient does rebleed may need IVC filter placement.  Will likely need to consider oral anticoagulation that can easily be reversed due to recent history of bleeding.  Will start patient on Coumadin with goal INR 2-3.  Follow.    2.  Hypertension Patient's was resumed yesterday however blood pressure borderline this morning and heart rate noted to be 49 overnight.  Discontinue Toprol-XL.  Continue Cardizem.  Follow.   3.  OSA  BiPAP nightly.  4.  Hypokalemia Repleted.  5.  Hyperlipidemia Continue statin.  6.  Paroxysmal atrial fibrillation Currently rate controlled on amiodarone.  Blood pressure noted to be somewhat borderline overnight and early this morning.  Patient also noted to have some bradycardia overnight with heart rate of 49.  Will discontinue home regimen Toprol-XL for now and if need be could likely resume at half home dose.  Continue IV heparin secondary  to problem #1.  Patient's Eliquis was discontinued during recent prior hospitalization due to significant hematuria.  Monitor closely for bleeding.  Follow.  7.  Postop urinary retention Status post Foley catheter placement.  Continue finasteride.  Patient was recently started on Bactrim given persistent leukocytosis with WBC trending down.  Repeat UA done with moderate leukocytes, nitrite negative, 21-50 WBCs.  Urine cultures with no growth to date.  Continue Bactrim for now.  Outpatient follow-up with urology.  8. History of chronic diastolic CHF Patient noted to be on the dry side on admission.  Patient was on gentle hydration which has subsequently been discontinued.  Monitor closely for volume overload.  Discontinue Toprol-XL due to episode of bradycardia overnight and borderline blood pressure.  Follow for now.    DVT prophylaxis: Heparin Code Status: Full Family Communication: Updated patient.  No family at bedside. Disposition Plan: Remain in stepdown unit.  If continued improvement could likely transfer to floor tomorrow.   Consultants:   None  Procedures:   2D echo 01/06/2019  Lower extremity Dopplers 01/06/2019    Antimicrobials:  Bactrim 01/05/2019 was started preadmission.   Subjective: Patient sitting in chair working with physical therapy.  Patient just finished ambulating in hallway with physical therapy.  Stated noted to be bradycardic overnight with some shortness of breath however shortness of breath better this morning.  Denies any chest pain.   Objective: Vitals:   01/08/19 0200 01/08/19 0400 01/08/19 0600 01/08/19 0800  BP: (!) 94/54 (!) 115/58 105/66 (!) 106/54  Pulse: (!) 51 (!) 55 (!) 49 64  Resp: 16 18 12 20   Temp:  98.6 F (37 C)  98 F (36.7 C)  TempSrc:  Oral  Axillary  SpO2: 99% 98% 100% 98%  Weight:      Height:        Intake/Output Summary (Last 24 hours) at 01/08/2019 0939 Last data filed at 01/08/2019 0600 Gross per 24 hour  Intake  529.64 ml  Output 3425 ml  Net -2895.36 ml   Filed Weights   01/05/19 1900 01/05/19 2300  Weight: 85.7 kg 81.2 kg    Examination:  General exam: NAD Respiratory system: Some coarse breath sounds in the bases otherwise clear.  No wheezing, no rhonchi, no crackles.  Speaking in full sentences.  Normal respiratory effort.  Cardiovascular system: RRR no murmurs rubs or gallops.  No JVD.  No lower extremity edema.  Gastrointestinal system: Abdomen is nontender, nondistended, soft, positive bowel sounds.  No rebound.  No guarding.  Central nervous system: Alert and oriented. No focal neurological deficits. Extremities: Symmetric 5 x 5 power. Skin: No rashes, lesions or ulcers Psychiatry: Judgement and insight appear normal. Mood & affect appropriate.     Data Reviewed: I have personally reviewed following labs and imaging studies  CBC: Recent Labs  Lab 01/05/19 1730 01/06/19 0157 01/07/19 0225 01/08/19 0242  WBC 14.6* 11.9* 11.2* 10.7*  NEUTROABS 11.2*  --   --   --   HGB 9.7* 8.9* 9.0* 9.3*  HCT 32.6* 29.3* 30.2* 30.2*  MCV 93.9 93.0 93.8 92.1  PLT 354 320 361 [id number]   Basic Metabolic Panel: Recent Labs  Lab 01/05/19 1730 01/06/19 0157 01/07/19  0225 01/08/19 0242  NA 139 140 140 139  K 3.9 3.4* 3.9 4.3  CL 109 112* 111 110  CO2 21* 21* 20* 20*  GLUCOSE 104* 95 97 110*  BUN 10 9 9 10   CREATININE 1.08 0.97 0.83 0.94  CALCIUM 8.4* 8.1* 8.4* 8.5*  MG  --  2.2  --  2.1  PHOS  --  4.1  --   --    GFR: Estimated Creatinine Clearance: 75.2 mL/min (by C-G formula based on SCr of 0.94 mg/dL). Liver Function Tests: Recent Labs  Lab 01/05/19 1730 01/06/19 0157  AST 18 16  ALT 21 18  ALKPHOS 56 52  BILITOT 0.4 0.5  PROT 7.0 6.3*  ALBUMIN 2.9* 2.8*   No results for input(s): LIPASE, AMYLASE in the last 168 hours. No results for input(s): AMMONIA in the last 168 hours. Coagulation Profile: Recent Labs  Lab 01/05/19 1730  INR 1.2   Cardiac Enzymes: No results  for input(s): CKTOTAL, CKMB, CKMBINDEX, TROPONINI in the last 168 hours. BNP (last 3 results) No results for input(s): PROBNP in the last 8760 hours. HbA1C: No results for input(s): HGBA1C in the last 72 hours. CBG: No results for input(s): GLUCAP in the last 168 hours. Lipid Profile: No results for input(s): CHOL, HDL, LDLCALC, TRIG, CHOLHDL, LDLDIRECT in the last 72 hours. Thyroid Function Tests: Recent Labs    01/06/19 0157  TSH 1.654   Anemia Panel: No results for input(s): VITAMINB12, FOLATE, FERRITIN, TIBC, IRON, RETICCTPCT in the last 72 hours. Sepsis Labs: No results for input(s): PROCALCITON, LATICACIDVEN in the last 168 hours.  Recent Results (from the past 240 hour(s))  SARS CORONAVIRUS 2 (TAT 6-24 HRS) Nasopharyngeal Nasopharyngeal Swab     Status: None   Collection Time: 01/05/19  6:20 PM   Specimen: Nasopharyngeal Swab  Result Value Ref Range Status   SARS Coronavirus 2 NEGATIVE NEGATIVE Final    Comment: (NOTE) SARS-CoV-2 target nucleic acids are NOT DETECTED. The SARS-CoV-2 RNA is generally detectable in upper and lower respiratory specimens during the acute phase of infection. Negative results do not preclude SARS-CoV-2 infection, do not rule out co-infections with other pathogens, and should not be used as the sole basis for treatment or other patient management decisions. Negative results must be combined with clinical observations, patient history, and epidemiological information. The expected result is Negative. Fact Sheet for Patients: SugarRoll.be Fact Sheet for Healthcare Providers: https://www.woods-mathews.com/ This test is not yet approved or cleared by the [place] FDA and  has been authorized for detection and/or diagnosis of SARS-CoV-2 by FDA under an Emergency Use Authorization (EUA). This EUA will remain  in effect (meaning this test can be used) for the duration of the COVID-19 declaration under  Section 56 4(b)(1) of the Act, 21 U.S.C. section 360bbb-3(b)(1), unless the authorization is terminated or revoked sooner. Performed at [facility], [place] [address], [place], [place] [address]   Culture, Urine     Status: None   Collection Time: 01/06/19  7:40 AM   Specimen: Urine, Clean Catch  Result Value Ref Range Status   Specimen Description   Final    URINE, CLEAN CATCH Performed at [facility], [place] [address], [place], [place] [address]    Special Requests   Final    NONE Performed at [facility], [place] [address], [place], [place] [address]    Culture   Final    NO GROWTH Performed at [facility],  [address], [place], [place] [address]    Report Status 01/07/2019 FINAL  Final         Radiology Studies: Vas [place] Lower Extremity Venous (dvt)  Result Date: 01/07/2019  Lower Venous Study Indications: Pulmonary embolism.  Comparison Study: no prior Performing Technologist: June Leap RDMS, RVT  Examination Guidelines: A complete evaluation includes B-mode imaging, spectral Doppler, color Doppler, and power Doppler as needed of all accessible portions of each vessel. Bilateral testing is considered an integral part of a complete examination. Limited examinations for reoccurring indications may be performed as noted.  +---------+---------------+---------+-----------+----------+-------------------+ RIGHT    CompressibilityPhasicitySpontaneityPropertiesThrombus Aging      +---------+---------------+---------+-----------+----------+-------------------+ CFV      Full           Yes      Yes                                      +---------+---------------+---------+-----------+----------+-------------------+ SFJ      Full                                                             +---------+---------------+---------+-----------+----------+-------------------+ FV Prox  Full                                                              +---------+---------------+---------+-----------+----------+-------------------+ FV Mid   Full                                                             +---------+---------------+---------+-----------+----------+-------------------+ FV DistalFull                                                             +---------+---------------+---------+-----------+----------+-------------------+ PFV      Full                                                             +---------+---------------+---------+-----------+----------+-------------------+ POP      Full           Yes      Yes                                      +---------+---------------+---------+-----------+----------+-------------------+ PTV      None  isolated DVT                                                              proximal segment    +---------+---------------+---------+-----------+----------+-------------------+ PERO     Full                                                             +---------+---------------+---------+-----------+----------+-------------------+   +---------+---------------+---------+-----------+----------+------------------+ LEFT     CompressibilityPhasicitySpontaneityPropertiesThrombus Aging     +---------+---------------+---------+-----------+----------+------------------+ CFV      Full           Yes      Yes                                     +---------+---------------+---------+-----------+----------+------------------+ SFJ      Full                                                            +---------+---------------+---------+-----------+----------+------------------+ FV Prox  Full                                                            +---------+---------------+---------+-----------+----------+------------------+ FV Mid   Full                                                             +---------+---------------+---------+-----------+----------+------------------+ FV DistalFull                                                            +---------+---------------+---------+-----------+----------+------------------+ PFV      Full                                                            +---------+---------------+---------+-----------+----------+------------------+ POP      Full           Yes      Yes                                     +---------+---------------+---------+-----------+----------+------------------+ PTV  Full                                                            +---------+---------------+---------+-----------+----------+------------------+ PERO     None                                         isolated DVT mid                                                         segment            +---------+---------------+---------+-----------+----------+------------------+     Summary: Right: Findings consistent with acute deep vein thrombosis involving the right posterior tibial veins. A cystic structure is found in the popliteal fossa. Left: Findings consistent with acute deep vein thrombosis involving the left peroneal veins. No cystic structure found in the popliteal fossa.  *See table(s) above for measurements and observations. Electronically signed by Ruta Hinds MD on 01/07/2019 at 11:05:32 AM.    Final         Scheduled Meds: . amiodarone  200 mg Oral Daily  . Chlorhexidine Gluconate Cloth  6 each Topical Daily  . [START ON 01/09/2019] ferrous sulfate  325 mg Oral Once per day on Tue Thu  . finasteride  5 mg Oral Daily  . pantoprazole  40 mg Oral Daily  . rosuvastatin  20 mg Oral QHS  . senna-docusate  1 tablet Oral BID  . sulfamethoxazole-trimethoprim  1 tablet Oral BID   Continuous Infusions: . heparin 1,300 Units/hr (01/07/19 2138)     LOS: 3 days    Time spent: 40 minutes    Irine Seal,  MD [facility]  If 7PM-7AM, please contact night-coverage [email or web address] 01/08/2019, 9:39 AM

## 2019-01-08 NOTE — Progress Notes (Addendum)
ANTICOAGULATION CONSULT NOTE - Consult  Pharmacy Consult for IV UFH, Warfarin Indication: pulmonary embolus  Allergies  Allergen Reactions  . 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 Nsaids     Duodenal ulcer.  anticoagulated   Patient Measurements: Height: 5\' 9"  (175.3 cm) Weight: 179 lb 0.2 oz (81.2 kg) IBW/kg (Calculated) : 70.7 Heparin Dosing Weight: 85.7  Vital Signs: Temp: 98 F (36.7 C) (09/21 0800) Temp Source: Axillary (09/21 0800) BP: 106/54 (09/21 0800) Pulse Rate: 64 (09/21 0800)  Labs: Recent Labs    01/05/19 1730 01/05/19 2202 01/06/19 0157  01/07/19 0225 01/07/19 1208 01/08/19 0242  HGB 9.7*  --  8.9*  --  9.0*  --  9.3*  HCT 32.6*  --  29.3*  --  30.2*  --  30.2*  PLT 354  --  320  --  361  --  362  APTT 35  --   --   --   --   --   --   LABPROT 15.4*  --   --   --   --   --   --   INR 1.2  --   --   --   --   --   --   HEPARINUNFRC  --   --  0.23*   < > 0.75* 0.73* 0.46  CREATININE 1.08  --  0.97  --  0.83  --  0.94  TROPONINIHS  --  11 9  --   --   --   --    < > = values in this interval not displayed.   Estimated Creatinine Clearance: 75.2 mL/min (by C-G formula based on SCr of 0.94 mg/dL).  Assessment: 41 yoM with multiple PE's to started IV heparin 9/18 per Rx dosing. Was previously on apixaban for hx Afib but was held due to hospital admission for acute blood loss anemia, hemorrhagic shock secondary to hematuria. Baseline labs: aPTT 35 sec, INR 1.2, Hgb 9.7  Today, 9/21 Day 4 of Heparin, adding Warfarin 1241 Hep level = 0.36 units/ml on 1300 units/hr INR 1.2 Hgb 9.3 (stable), Plt 362 Note concurrent Septra DS can increase INR  Goal of Therapy:  Heparin level 0.3-0.7 units/ml Monitor platelets by anticoagulation protocol: Yes   Plan:   Continue heparin drip at 1300 units/hr  Warfarin 7.5mg  today at 1800  Daily CBC, INR and heparin level  Minda Ditto PharmD Pager [phone] 01/08/2019, 1:46 PM

## 2019-01-09 ENCOUNTER — Ambulatory Visit: Payer: BC Managed Care – PPO | Admitting: Physical Therapy

## 2019-01-09 LAB — CBC
HCT: 32.3 % — ABNORMAL LOW (ref 39.0–52.0)
Hemoglobin: 9.7 g/dL — ABNORMAL LOW (ref 13.0–17.0)
MCH: 27.6 pg (ref 26.0–34.0)
MCHC: 30 g/dL (ref 30.0–36.0)
MCV: 92 fL (ref 80.0–100.0)
Platelets: 398 10*3/uL (ref 150–400)
RBC: 3.51 MIL/uL — ABNORMAL LOW (ref 4.22–5.81)
RDW: 14.9 % (ref 11.5–15.5)
WBC: 11.6 10*3/uL — ABNORMAL HIGH (ref 4.0–10.5)
nRBC: 0 % (ref 0.0–0.2)

## 2019-01-09 LAB — HEPARIN LEVEL (UNFRACTIONATED)
Heparin Unfractionated: 0.24 IU/mL — ABNORMAL LOW (ref 0.30–0.70)
Heparin Unfractionated: 0.6 IU/mL (ref 0.30–0.70)

## 2019-01-09 LAB — PROTIME-INR
INR: 1.2 (ref 0.8–1.2)
Prothrombin Time: 14.9 seconds (ref 11.4–15.2)

## 2019-01-09 LAB — BASIC METABOLIC PANEL
Anion gap: 9 (ref 5–15)
BUN: 12 mg/dL (ref 8–23)
CO2: 22 mmol/L (ref 22–32)
Calcium: 8.7 mg/dL — ABNORMAL LOW (ref 8.9–10.3)
Chloride: 105 mmol/L (ref 98–111)
Creatinine, Ser: 0.91 mg/dL (ref 0.61–1.24)
GFR calc Af Amer: >60 mL/min (ref >60)
GFR calc non Af Amer: >60 mL/min (ref >60)
Glucose, Bld: 116 mg/dL — ABNORMAL HIGH (ref 70–99)
Potassium: 4.2 mmol/L (ref 3.5–5.1)
Sodium: 136 mmol/L (ref 135–145)

## 2019-01-09 MED ORDER — WARFARIN SODIUM 5 MG PO TABS
7.5000 mg | ORAL_TABLET | Freq: Once | ORAL | Status: AC
  Administered 2019-01-09: 7.5 mg via ORAL
  Filled 2019-01-09: qty 1

## 2019-01-09 MED ORDER — COUMADIN BOOK
Freq: Once | Status: AC
  Administered 2019-01-09: 16:00:00
  Filled 2019-01-09: qty 1

## 2019-01-09 MED ORDER — METOPROLOL SUCCINATE ER 50 MG PO TB24
50.0000 mg | ORAL_TABLET | Freq: Every day | ORAL | Status: DC
  Administered 2019-01-09 – 2019-01-14 (×6): 50 mg via ORAL
  Filled 2019-01-09 (×4): qty 1
  Filled 2019-01-09: qty 2
  Filled 2019-01-09: qty 1

## 2019-01-09 NOTE — Progress Notes (Signed)
ANTICOAGULATION CONSULT NOTE - Consult  Pharmacy Consult for IV UFH, Warfarin Indication: pulmonary embolus  Allergies  Allergen Reactions  . 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 Nsaids     Duodenal ulcer.  anticoagulated   Patient Measurements: Height: 5\' 8"  (172.7 cm) Weight: 179 lb 0.2 oz (81.2 kg) IBW/kg (Calculated) : 68.4 Heparin Dosing Weight: 85.7  Vital Signs: Temp: 97.5 F (36.4 C) (09/22 1327) Temp Source: Oral (09/22 1327) BP: 122/63 (09/22 1327) Pulse Rate: 75 (09/22 1327)  Labs: Recent Labs    01/07/19 0225  01/08/19 0242 01/08/19 1241 01/09/19 0211 01/09/19 1336  HGB 9.0*  --  9.3*  --  9.7*  --   HCT 30.2*  --  30.2*  --  32.3*  --   PLT 361  --  362  --  398  --   LABPROT  --   --   --  14.8 14.9  --   INR  --   --   --  1.2 1.2  --   HEPARINUNFRC 0.75*   < > 0.46 0.36 0.24* 0.60  CREATININE 0.83  --  0.94  --  0.91  --    < > = values in this interval not displayed.   Estimated Creatinine Clearance: 75.2 mL/min (by C-G formula based on SCr of 0.91 mg/dL).  Assessment: 63 yoM with multiple PE's to started IV heparin 9/18 per Rx dosing. Was previously on apixaban for hx Afib but was held due to hospital admission for acute blood loss anemia, hemorrhagic shock secondary to hematuria. Baseline labs: aPTT 35 sec, INR 1.2, Hgb 9.7  Today, 01/09/2019 Day 5 of Heparin, added Warfarin 9/21 Hep level 0211 = 0.24 units/ml, Heparin increased to 1400 units/hr Hep level 1336 = 0.60 units/ml, sample drawn appropriately from opposite arm  INR 1.2 after Warfarin 7.5mg  Hgb 9.7 (stable), Plt 398 Note concurrent Septra DS can increase INR  Goal of Therapy:  Heparin level 0.3-0.7 units/ml Monitor platelets by anticoagulation protocol: Yes   Plan:   Continue heparin drip at 1400 units/hr  Warfarin 7.5mg  today at 1800  Daily CBC, INR and heparin level  Minda Ditto PharmD Pager [phone] 01/09/2019, 2:29 PM

## 2019-01-09 NOTE — Progress Notes (Signed)
Report given to [facility], RN and all questions answered. Notified wife of transfer.

## 2019-01-09 NOTE — Progress Notes (Signed)
ANTICOAGULATION CONSULT NOTE - Follow Up Consult  Pharmacy Consult for Heparin Indication: pulmonary embolus  Allergies  Allergen Reactions  . 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 Nsaids     Duodenal ulcer.  anticoagulated    Patient Measurements: Height: 5\' 9"  (175.3 cm) Weight: 179 lb 0.2 oz (81.2 kg) IBW/kg (Calculated) : 70.7 Heparin Dosing Weight:   Vital Signs: Temp: 98.1 F (36.7 C) (09/22 0400) Temp Source: Oral (09/22 0400) BP: 121/84 (09/22 0400) Pulse Rate: 82 (09/22 0500)  Labs: Recent Labs    01/07/19 0225  01/08/19 0242 01/08/19 1241 01/09/19 0211  HGB 9.0*  --  9.3*  --  9.7*  HCT 30.2*  --  30.2*  --  32.3*  PLT 361  --  362  --  398  LABPROT  --   --   --  14.8 14.9  INR  --   --   --  1.2 1.2  HEPARINUNFRC 0.75*   < > 0.46 0.36 0.24*  CREATININE 0.83  --  0.94  --  0.91   < > = values in this interval not displayed.    Estimated Creatinine Clearance: 77.7 mL/min (by C-G formula based on SCr of 0.91 mg/dL).   Medications:  Infusions:  . heparin 1,300 Units/hr (01/09/19 0500)    Assessment: Patient with unchanged INR and low heparin level.  No heparin issues per RN.  Goal of Therapy:  INR 2-3 Heparin level 0.3-0.7 units/ml Monitor platelets by anticoagulation protocol: Yes   Plan:  Increase heparin to 1400 units/hr Recheck heparin level at 1400  Warfarin 7.5mg  po x1 at [address] [place] 01/09/2019,5:44 AM

## 2019-01-09 NOTE — Progress Notes (Signed)
PROGRESS NOTE    Philip Jackson  [id number] DOB: [birth year] DOA: 01/05/2019 PCP: Leonides Sake, MD    Brief Narrative:  HPI per Dr. Luan Moore [name] is a 69 y.o. 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 Also had acute kidney injury with hematuria and urine retention. At that time urology saw the patient and he was discharged home. Of note patient had recent UTI diagnosis an outpatient was pansensitive he completed 5 days of Keflex HE returned with hypotension due to hematuria and needed emergent blood transfusion as there was 5 g drop in hemoglobin with frank hematuria. CT scan showed bladder mass versus clot. Urology was consulted and continuous bladder irrigation started. Hospital course complicated by atrial fibrillation with RVR therefore cardiology consulted. Cystoscopy, completed on 12/29/2018 with clot evacuation   He was discharged home on 9/14 at the time of discharge Hg 9.0 Continue Foley catheter, and finasteride. He was no longer on Eliquis  Since then he had worsening shortness of breath family states he was shortness of breath even prior to discharge No leg swelling, no chest pain  No fever  Urology have seen him as an outpatient he was able to urinate on his own  He still got a foley catheter  His last coloscopy was 68 yo and was normal  He quit smoking in 10 years   Assessment & Plan:   Principal Problem:   Bilateral pulmonary embolism (HCC) Active Problems:   DVT of lower extremity, bilateral ([place])   HTN (hypertension)   HLD (hyperlipidemia)   OSA  treated with BiPAP   Postoperative urinary retention   PAF (paroxysmal atrial fibrillation) ([place])   Pulmonary embolism (HCC)   Hypokalemia  1 multiple bilateral pulmonary emboli/bilateral lower extremity DVT. Per CT angiogram which was done prior to presentation to the ED.  Patient with no overt bleeding this morning.  Foley catheter clear.  2D echo with EF of 60 to 65%, mildly increased LVH, left ventricular diastolic Doppler parameters consistent with impaired relaxation pattern of LV diastolic filling, global right ventricle has moderately reduced systolic function, right ventricular size severely enlarged.  Lower extremity Dopplers consistent with bilateral DVT. Risk factors for PE include hypercoagulable state, recent surgery, prolonged illness with decreased mobility.  Patient with no signs of hypotension.  Patient with prior history of severe hematuria requiring emergent transfusion during prior hospitalization with evacuation of clot noted in the bladder.  Continue IV heparin and Coumadin started on 01/08/2019 as can easily be reversed due to recent history of bleeding.  Goal INR 2-3.  Outpatient follow-up and if no further bleeding in the future may consider transitioning back to NOAC.  If patient does rebleed may need IVC filter placement.   2.  Hypertension Patient's borderline blood pressure improved.  Resume half home dose Toprol-XL.   3.  OSA  BiPAP nightly.  4.  Hypokalemia Repleted.  5.  Hyperlipidemia Continue statin.  6.  Paroxysmal atrial fibrillation Currently rate controlled on amiodarone.  Heart rate in the high 90s.  Blood pressure improved.  No bradycardia noted overnight.  Continue  amiodarone.  Resume half home dose Toprol-XL.  Continue IV heparin secondary to problem #1.  Patient started on Coumadin for anticoagulation as patient had presented prior hospitalization with significant hematuria.  Outpatient follow-up with cardiology.   7.  Postop urinary retention  Status post Foley catheter placement.  Continue finasteride.  Patient was recently started on Bactrim given persistent leukocytosis with WBC trending down.  Repeat UA done with moderate leukocytes, nitrite negative, 21-50 WBCs.  Urine cultures with no growth to date.  Continue Bactrim for now.  Outpatient follow-up with urology.  8. History of chronic diastolic CHF Patient noted to be on the dry side on admission.  Patient was on gentle hydration which has subsequently been discontinued.  Monitor closely for volume overload.  Resume half home dose of Toprol-XL as heart rate improved and no longer bradycardic with improvement with blood pressure.  Follow.    DVT prophylaxis: Heparin Code Status: Full Family Communication: Updated patient.  No family at bedside. Disposition Plan: Transfer to telemetry.  Likely home once INR is therapeutic.    Consultants:   None  Procedures:   2D echo 01/06/2019  Lower extremity Dopplers 01/06/2019    Antimicrobials:  Bactrim 01/05/2019 was started preadmission.   Subjective: Patient sitting up in chair.  Denies any chest pain or shortness of breath.  Feeling better.  No bleeding noted.  Patient states slept well on BiPAP.  Objective: Vitals:   01/09/19 0500 01/09/19 0600 01/09/19 0700 01/09/19 0800  BP:  126/82  140/84  Pulse: 82 75  80  Resp: 19 13  18   Temp:   97.9 F (36.6 C)   TempSrc:   Axillary   SpO2: 96% 97%  96%  Weight:      Height:        Intake/Output Summary (Last 24 hours) at 01/09/2019 0846 Last data filed at 01/09/2019 0604 Gross per 24 hour  Intake 552.57 ml  Output 3050 ml  Net -2497.43 ml   Filed Weights   01/05/19 1900 01/05/19 2300  Weight: 85.7 kg 81.2 kg    Examination:  General exam: NAD Respiratory system: Lungs clear to auscultation bilaterally.  No wheezes, no crackles, no rhonchi.  Normal respiratory effort.  Speaking in full sentences.  Cardiovascular system: Regular rate rhythm no murmurs rubs or  gallops.  No JVD.  No lower extremity edema.  Gastrointestinal system: Abdomen is soft, nontender, nondistended, positive bowel sounds.  No rebound.  No guarding.  Central nervous system: Alert and oriented. No focal neurological deficits. Extremities: Symmetric 5 x 5 power. Skin: No rashes, lesions or ulcers Psychiatry: Judgement and insight appear normal. Mood & affect appropriate.     Data Reviewed: I have personally reviewed following labs and imaging studies  CBC: Recent Labs  Lab 01/05/19 1730 01/06/19 0157 01/07/19 0225 01/08/19 0242 01/09/19 0211  WBC 14.6* 11.9* 11.2* 10.7* 11.6*  NEUTROABS 11.2*  --   --   --   --   HGB 9.7* 8.9* 9.0* 9.3* 9.7*  HCT 32.6* 29.3* 30.2* 30.2* 32.3*  MCV 93.9 93.0 93.8 92.1 92.0  PLT 354 320 361 362 [id number]   Basic Metabolic Panel: Recent Labs  Lab 01/05/19 1730 01/06/19 0157 01/07/19 0225 01/08/19 0242 01/09/19 0211  NA 139 140 140 139 136  K 3.9 3.4* 3.9 4.3 4.2  CL 109 112* 111 110 105  CO2 21* 21* 20* 20* 22  GLUCOSE 104* 95 97 110* 116*  BUN 10 9 9 10 12   CREATININE 1.08  0.97 0.83 0.94 0.91  CALCIUM 8.4* 8.1* 8.4* 8.5* 8.7*  MG  --  2.2  --  2.1  --   PHOS  --  4.1  --   --   --    GFR: Estimated Creatinine Clearance: 77.7 mL/min (by C-G formula based on SCr of 0.91 mg/dL). Liver Function Tests: Recent Labs  Lab 01/05/19 1730 01/06/19 0157  AST 18 16  ALT 21 18  ALKPHOS 56 52  BILITOT 0.4 0.5  PROT 7.0 6.3*  ALBUMIN 2.9* 2.8*   No results for input(s): LIPASE, AMYLASE in the last 168 hours. No results for input(s): AMMONIA in the last 168 hours. Coagulation Profile: Recent Labs  Lab 01/05/19 1730 01/08/19 1241 01/09/19 0211  INR 1.2 1.2 1.2   Cardiac Enzymes: No results for input(s): CKTOTAL, CKMB, CKMBINDEX, TROPONINI in the last 168 hours. BNP (last 3 results) No results for input(s): PROBNP in the last 8760 hours. HbA1C: No results for input(s): HGBA1C in the last 72 hours. CBG: No results for  input(s): GLUCAP in the last 168 hours. Lipid Profile: No results for input(s): CHOL, HDL, LDLCALC, TRIG, CHOLHDL, LDLDIRECT in the last 72 hours. Thyroid Function Tests: No results for input(s): TSH, T4TOTAL, FREET4, T3FREE, THYROIDAB in the last 72 hours. Anemia Panel: No results for input(s): VITAMINB12, FOLATE, FERRITIN, TIBC, IRON, RETICCTPCT in the last 72 hours. Sepsis Labs: No results for input(s): PROCALCITON, LATICACIDVEN in the last 168 hours.  Recent Results (from the past 240 hour(s))  SARS CORONAVIRUS 2 (TAT 6-24 HRS) Nasopharyngeal Nasopharyngeal Swab     Status: None   Collection Time: 01/05/19  6:20 PM   Specimen: Nasopharyngeal Swab  Result Value Ref Range Status   SARS Coronavirus 2 NEGATIVE NEGATIVE Final    Comment: (NOTE) SARS-CoV-2 target nucleic acids are NOT DETECTED. The SARS-CoV-2 RNA is generally detectable in upper and lower respiratory specimens during the acute phase of infection. Negative results do not preclude SARS-CoV-2 infection, do not rule out co-infections with other pathogens, and should not be used as the sole basis for treatment or other patient management decisions. Negative results must be combined with clinical observations, patient history, and epidemiological information. The expected result is Negative. Fact Sheet for Patients: SugarRoll.be Fact Sheet for Healthcare Providers: https://www.woods-mathews.com/ This test is not yet approved or cleared by the [place] FDA and  has been authorized for detection and/or diagnosis of SARS-CoV-2 by FDA under an Emergency Use Authorization (EUA). This EUA will remain  in effect (meaning this test can be used) for the duration of the COVID-19 declaration under Section 56 4(b)(1) of the Act, 21 U.S.C. section 360bbb-3(b)(1), unless the authorization is terminated or revoked sooner. Performed at [facility], [place] [address] [place], [place]  [address]   Culture, Urine     Status: None   Collection Time: 01/06/19  7:40 AM   Specimen: Urine, Clean Catch  Result Value Ref Range Status   Specimen Description   Final    URINE, CLEAN CATCH Performed at [facility], [place] [address], [place], [place] [address]    Special Requests   Final    NONE Performed at [facility], [place] [address] [place], [place] [address]    Culture   Final    NO GROWTH Performed at [facility], [place] [address], [place], [place] [address]    Report Status 01/07/2019 FINAL  Final         Radiology Studies:  No results found.      Scheduled Meds: . amiodarone  200 mg Oral Daily  . Chlorhexidine Gluconate Cloth  6 each Topical Daily  . ferrous sulfate  325 mg Oral Once per day on Tue Thu  . finasteride  5 mg Oral Daily  . pantoprazole  40 mg Oral Daily  . rosuvastatin  20 mg Oral QHS  . senna-docusate  1 tablet Oral BID  . sulfamethoxazole-trimethoprim  1 tablet Oral BID  . warfarin  7.5 mg Oral ONCE-1800  . Warfarin - Pharmacist Dosing Inpatient   Does not apply q1800   Continuous Infusions: . heparin 1,400 Units/hr (01/09/19 0604)     LOS: 4 days    Time spent: 40 minutes    Irine Seal, MD [facility]  If 7PM-7AM, please contact night-coverage [email or web address] 01/09/2019, 8:46 AM

## 2019-01-09 NOTE — Progress Notes (Signed)
Occupational Therapy Treatment Patient Details Name: Philip Jackson MRN: [id number] DOB: Jun 30, 1950 Today's Date: 01/09/2019    History of present illness Pt admitted with bil multiple PE and LE DVT; Pt with hx of CHI, PAF, a-fib, and R TKR (8/20).     OT comments   Energy conservation handout provided and gone over with pt.  Pt stated this was very helpful and he would use these suggestions.  Wife present for part of OT session.   Follow Up Recommendations  Supervision/Assistance - 24 hour    Equipment Recommendations  3 in 1 bedside commode    Recommendations for Other Services      Precautions / Restrictions Precautions Precautions: Fall Restrictions Weight Bearing Restrictions: No RLE Weight Bearing: Weight bearing as tolerated       Mobility Bed Mobility Overal bed mobility: Modified Independent             General bed mobility comments: Increased time to perform  Transfers Overall transfer level: Needs assistance Equipment used: Rolling walker (2 wheeled) Transfers: Sit to/from [employer] Transfers Sit to Stand: From elevated surface;Supervision Stand pivot transfers: Supervision       General transfer comment: Min guard for safety, verbal cuing for hand placement. Pt with self-steadying upon standing.    Balance Overall balance assessment: Mild deficits observed, not formally tested                                         ADL either performed or assessed with clinical judgement   ADL Overall ADL's : Needs assistance/impaired     Grooming: Standing;Set up               Lower Body Dressing: Sit to/from stand;Cueing for safety;Cueing for sequencing;Cueing for compensatory techniques;Supervision/safety Lower Body Dressing Details (indicate cue type and reason): socks Toilet Transfer: Supervision/safety;Ambulation Toilet Transfer Details (indicate cue type and reason): IV pokle Toileting- Clothing Manipulation and Hygiene:  Supervision/safety;Sit to/from stand;Cueing for compensatory techniques;Cueing for safety       Functional mobility during ADLs: Supervision/safety;Cueing for sequencing General ADL Comments: Pt overall S with IV pole in room.  OT session focused on energy conservation as well as fxal mobility in the room with IV pole.     Vision Patient Visual Report: No change from baseline            Cognition Arousal/Alertness: Awake/alert Behavior During Therapy: WFL for tasks assessed/performed Overall Cognitive Status: Within Functional Limits for tasks assessed                                                     Pertinent Vitals/ Pain       Pain Assessment: No/denies pain         Frequency  Min 2X/week        Progress Toward Goals  OT Goals(current goals can now be found in the care plan section)  Progress towards OT goals: Progressing toward goals     Plan Discharge plan remains appropriate       AM-PAC OT "6 Clicks" Daily Activity     Outcome Measure   Help from another person eating meals?: None Help from another person taking care of personal grooming?: None Help from another person toileting,  which includes using toliet, bedpan, or urinal?: A Little Help from another person bathing (including washing, rinsing, drying)?: A Little Help from another person to put on and taking off regular upper body clothing?: None Help from another person to put on and taking off regular lower body clothing?: A Little 6 Click Score: 21    End of Session Equipment Utilized During Treatment: Rolling walker;Gait belt  OT Visit Diagnosis: Unsteadiness on feet (R26.81);Other abnormalities of gait and mobility (R26.89);Muscle weakness (generalized) (M62.81)   Activity Tolerance Patient tolerated treatment well   Patient Left in chair;with call bell/phone within reach   Nurse Communication Mobility status        Time: [id number] OT Time Calculation (min): 13  min  Charges: OT General Charges $OT Visit: 1 Visit OT Treatments $Self Care/Home Management : 8-22 mins  Kari Baars, [place] [phone] Office- [phone]      Reichen Hutzler, Edwena Felty D 01/09/2019, 4:59 PM

## 2019-01-10 ENCOUNTER — Telehealth: Payer: Self-pay | Admitting: Interventional Cardiology

## 2019-01-10 LAB — PROTIME-INR
INR: 1.4 — ABNORMAL HIGH (ref 0.8–1.2)
Prothrombin Time: 16.5 seconds — ABNORMAL HIGH (ref 11.4–15.2)

## 2019-01-10 LAB — CBC
HCT: 33.6 % — ABNORMAL LOW (ref 39.0–52.0)
Hemoglobin: 10.2 g/dL — ABNORMAL LOW (ref 13.0–17.0)
MCH: 27.7 pg (ref 26.0–34.0)
MCHC: 30.4 g/dL (ref 30.0–36.0)
MCV: 91.3 fL (ref 80.0–100.0)
Platelets: 421 10*3/uL — ABNORMAL HIGH (ref 150–400)
RBC: 3.68 MIL/uL — ABNORMAL LOW (ref 4.22–5.81)
RDW: 15 % (ref 11.5–15.5)
WBC: 10.9 10*3/uL — ABNORMAL HIGH (ref 4.0–10.5)
nRBC: 0 % (ref 0.0–0.2)

## 2019-01-10 LAB — BASIC METABOLIC PANEL
Anion gap: 10 (ref 5–15)
BUN: 10 mg/dL (ref 8–23)
CO2: 21 mmol/L — ABNORMAL LOW (ref 22–32)
Calcium: 9.1 mg/dL (ref 8.9–10.3)
Chloride: 104 mmol/L (ref 98–111)
Creatinine, Ser: 0.95 mg/dL (ref 0.61–1.24)
GFR calc Af Amer: >60 mL/min (ref >60)
GFR calc non Af Amer: >60 mL/min (ref >60)
Glucose, Bld: 105 mg/dL — ABNORMAL HIGH (ref 70–99)
Potassium: 4.4 mmol/L (ref 3.5–5.1)
Sodium: 135 mmol/L (ref 135–145)

## 2019-01-10 LAB — HEPARIN LEVEL (UNFRACTIONATED)
Heparin Unfractionated: 0.37 IU/mL (ref 0.30–0.70)
Heparin Unfractionated: 0.54 IU/mL (ref 0.30–0.70)

## 2019-01-10 MED ORDER — WARFARIN SODIUM 5 MG PO TABS
5.0000 mg | ORAL_TABLET | Freq: Once | ORAL | Status: AC
  Administered 2019-01-10: 5 mg via ORAL
  Filled 2019-01-10: qty 1

## 2019-01-10 NOTE — Progress Notes (Signed)
[place] for IV UFH, Warfarin Indication: PE/DVT  Allergies  Allergen Reactions  . 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 Nsaids     Duodenal ulcer.  anticoagulated   Patient Measurements: Height: 5\' 8"  (172.7 cm) Weight: 179 lb 0.2 oz (81.2 kg) IBW/kg (Calculated) : 68.4 Heparin Dosing Weight: actual body weight   Vital Signs: Temp: 98.8 F (37.1 C) (09/23 2054) Temp Source: Oral (09/23 2054) BP: 119/64 (09/23 2054) Pulse Rate: 64 (09/23 2054)  Labs: Recent Labs    01/08/19 0242 01/08/19 1241 01/09/19 0211 01/09/19 1336 01/10/19 0409 01/10/19 1949  HGB 9.3*  --  9.7*  --  10.2*  --   HCT 30.2*  --  32.3*  --  33.6*  --   PLT 362  --  398  --  421*  --   LABPROT  --  14.8 14.9  --  16.5*  --   INR  --  1.2 1.2  --  1.4*  --   HEPARINUNFRC 0.46 0.36 0.24* 0.60 0.37 0.54  CREATININE 0.94  --  0.91  --  0.95  --    Estimated Creatinine Clearance: 72 mL/min (by C-G formula based on SCr of 0.95 mg/dL).  Assessment:  41 yoM with multiple PEs per CTa on 01/05/2019. LE dopplers also consistent with bilateral DVT. Pharmacy consulted for IV heparin dosing. Patient was previously on apixaban for hx of a-fib but was held due to hospital admission for acute blood loss anemia, hemorrhagic shock secondary to hematuria. Baseline labs: aPTT 35 sec, INR 1.2, Hgb 9.7.   Today, 01/10/2019:  Day 6 of Heparin, Day 3 of Warfarin   Confirmatory Heparin level = 0.54 units/mL, therapeutic on 1400 units/hr  INR 1.4, remains subtherapeutic but trending up  Hgb low/improved to 10.2, Pltc elevated   No bleeding or infusion issues noted per nursing  Major DDIs with Warfarin: Septra DS, Amiodarone  Goal of Therapy:  Heparin level 0.3-0.7 units/ml  INR 2-3 Monitor platelets by anticoagulation protocol: Yes   Plan:   Continue heparin infusion at 1400 units/hr  Warfarin 5 mg PO x 1 today (given at [place])  Daily CBC, INR, and heparin  level  Monitor closely for s/sx of bleeding  Eudelia Bunch, Pharm.D [phone] 01/10/2019 9:04 PM

## 2019-01-10 NOTE — Telephone Encounter (Signed)
New message  Patient is returning call. Please give patient a call back.

## 2019-01-10 NOTE — Progress Notes (Signed)
Physical Therapy Treatment Patient Details Name: Philip Jackson MRN: [id number] DOB: 09-26-1950 Today's Date: 01/10/2019    History of Present Illness Pt admitted with bil multiple PE and LE DVT; Pt with hx of CHI, PAF, a-fib, and R TKR (8/20).      PT Comments    Pt continues to participate well.    Follow Up Recommendations  Outpatient PT(once cleared by MDs)     Equipment Recommendations  None recommended by PT    Recommendations for Other Services       Precautions / Restrictions Precautions Precautions: Fall Restrictions Weight Bearing Restrictions: No    Mobility  Bed Mobility               General bed mobility comments: oob in recliner  Transfers Overall transfer level: Needs assistance Equipment used: Rolling walker (2 wheeled) Transfers: Sit to/from Stand Sit to Stand: Supervision            Ambulation/Gait Ambulation/Gait assistance: Supervision Gait Distance (Feet): 500 Feet Assistive device: Rolling walker (2 wheeled)           Stairs             Wheelchair Mobility    Modified Rankin (Stroke Patients Only)       Balance Overall balance assessment: Mild deficits observed, not formally tested                                          Cognition Arousal/Alertness: Awake/alert Behavior During Therapy: WFL for tasks assessed/performed Overall Cognitive Status: Within Functional Limits for tasks assessed                                        Exercises Total Joint Exercises Long Arc Quad: AROM;Right;10 reps;Seated Knee Flexion: AROM;Right;10 reps;Standing Marching in Standing: AROM;Both;10 reps;Standing General Exercises - Lower Extremity Heel Raises: AROM;Both;10 reps;Standing    General Comments        Pertinent Vitals/Pain Pain Assessment: No/denies pain    Home Living                      Prior Function            PT Goals (current goals can now be found in the  care plan section) Progress towards PT goals: Progressing toward goals    Frequency    Min 3X/week      PT Plan Current plan remains appropriate    Co-evaluation              AM-PAC PT "6 Clicks" Mobility   Outcome Measure  Help needed turning from your back to your side while in a flat bed without using bedrails?: None Help needed moving from lying on your back to sitting on the side of a flat bed without using bedrails?: None Help needed moving to and from a bed to a chair (including a wheelchair)?: A Little Help needed standing up from a chair using your arms (e.g., wheelchair or bedside chair)?: A Little Help needed to walk in hospital room?: A Little Help needed climbing 3-5 steps with a railing? : A Little 6 Click Score: 20    End of Session Equipment Utilized During Treatment: Gait belt Activity Tolerance: Patient tolerated treatment well Patient left: in chair;with call bell/phone within  reach;with family/visitor present   PT Visit Diagnosis: Other abnormalities of gait and mobility (R26.89)     Time: [id number] PT Time Calculation (min) (ACUTE ONLY): 15 min  Charges:  $Gait Training: 8-22 mins                        Weston Anna, PT Acute Rehabilitation Services Pager: [phone] Office: [phone]

## 2019-01-10 NOTE — Progress Notes (Signed)
[place] for IV UFH, Warfarin Indication: PE/DVT  Allergies  Allergen Reactions  . 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 Nsaids     Duodenal ulcer.  anticoagulated   Patient Measurements: Height: 5\' 8"  (172.7 cm) Weight: 179 lb 0.2 oz (81.2 kg) IBW/kg (Calculated) : 68.4 Heparin Dosing Weight: actual body weight   Vital Signs: Temp: 97.8 F (36.6 C) (09/23 1359) BP: 112/66 (09/23 1359) Pulse Rate: 67 (09/23 1359)  Labs: Recent Labs    01/08/19 0242 01/08/19 1241 01/09/19 0211 01/09/19 1336 01/10/19 0409  HGB 9.3*  --  9.7*  --  10.2*  HCT 30.2*  --  32.3*  --  33.6*  PLT 362  --  398  --  421*  LABPROT  --  14.8 14.9  --  16.5*  INR  --  1.2 1.2  --  1.4*  HEPARINUNFRC 0.46 0.36 0.24* 0.60 0.37  CREATININE 0.94  --  0.91  --  0.95   Estimated Creatinine Clearance: 72 mL/min (by C-G formula based on SCr of 0.95 mg/dL).  Assessment:  37 yoM with multiple PEs per CTa on 01/05/2019. LE dopplers also consistent with bilateral DVT. Pharmacy consulted for IV heparin dosing. Patient was previously on apixaban for hx of a-fib but was held due to hospital admission for acute blood loss anemia, hemorrhagic shock secondary to hematuria. Baseline labs: aPTT 35 sec, INR 1.2, Hgb 9.7.   Today, 01/10/2019:  Day 6 of Heparin, Day 3 of Warfarin   Heparin level = 0.37 units/mL, on lower end of goal range  INR 1.4, remains subtherapeutic but trending up  Hgb low/improved to 10.2, Pltc elevated   No bleeding or infusion issues noted per nursing  Major DDIs with Warfarin: Septra DS, Amiodarone  Goal of Therapy:  Heparin level 0.3-0.7 units/ml  INR 2-3 Monitor platelets by anticoagulation protocol: Yes   Plan:   Continue heparin infusion at 1400 units/hr  Warfarin 5 mg PO x 1 today   Re-check heparin level this PM to ensure remains within goal range  Daily CBC, INR, and heparin level  Monitor closely for s/sx of  bleeding   Lindell Spar, PharmD, BCPS Clinical Pharmacist  01/10/2019, 6:02 PM

## 2019-01-10 NOTE — Progress Notes (Signed)
PROGRESS NOTE    Philip Jackson  [id number] DOB: November 11, 1950 DOA: 01/05/2019 PCP: Leonides Sake, MD    Brief Narrative:  HPI per Dr. Luan Moore Sarti is a 68 y.o. 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 Also had acute kidney injury with hematuria and urine retention. At that time urology saw the patient and he was discharged home. Of note patient had recent UTI diagnosis an outpatient was pansensitive he completed 5 days of Keflex HE returned with hypotension due to hematuria and needed emergent blood transfusion as there was 5 g drop in hemoglobin with frank hematuria. CT scan showed bladder mass versus clot. Urology was consulted and continuous bladder irrigation started. Hospital course complicated by atrial fibrillation with RVR therefore cardiology consulted. Cystoscopy, completed on 12/29/2018 with clot evacuation   He was discharged home on 9/14 at the time of discharge Hg 9.0 Continue Foley catheter, and finasteride. -------He was no longer on Eliquis  Since then he had worsening shortness of breath family states he was shortness of breath even prior to discharge, No leg swelling, no chest pain   Urology have seen him as an outpatient he was able to urinate on his own  He still got a foley catheter ---------------------------------------------------------------------------------------------------------------------------  Subjective:  The patient was seen and examined this morning, sitting up in chair in no acute distress reporting improved shortness of breath denies any  chest pain.  Was compliant with BiPAP overnight. Apnea 96% on room air.   Assessment & Plan:   Principal Problem:   Bilateral pulmonary embolism (HCC) Active Problems:   HTN (hypertension)   HLD (hyperlipidemia)   OSA treated with BiPAP   Postoperative urinary retention   PAF (paroxysmal atrial fibrillation) (HCC)   Pulmonary embolism (HCC)   Hypokalemia   DVT of lower extremity, bilateral (HCC)  PE 1.  multiple bilateral pulmonary emboli/bilateral lower extremity DVT. --Remained stable on heparin drip, on Coumadin, INR subtherapeutic -Improved shortness of breath, denies any chest pain  Per CT angiogram which was done prior to presentation to the ED.  Patient with no overt bleeding this morning.  Foley catheter clear.   -2D echo with EF of 60 to 65%, mildly increased LVH, left ventricular diastolic Doppler parameters consistent with impaired relaxation pattern of LV diastolic filling, global right ventricle has moderately reduced systolic function, right ventricular size severely enlarged.  -Lower extremity Dopplers consistent with bilateral DVT. Risk factors for PE include hypercoagulable state, recent surgery, prolonged illness with decreased mobility.  Patient with no signs of hypotension.  Patient with prior history of severe hematuria requiring emergent transfusion during prior hospitalization with evacuation of clot noted in the bladder.  - Continue IV heparin and Coumadin started on 01/08/2019 as can easily be reversed due to recent history of bleeding.  Goal INR 2-3.   -Outpatient follow-up and if No further bleeding in the future may consider transitioning back to NOAC.  If patient does rebleed may need IVC filter placement.   -The finding plan of care as above has been discussed with the patient in detail he expressed understanding  and agreement with current plan.  2.  Hypertension -Blood pressure remained stable, improved -Back on half home dose Toprol-XL.   3.  OSA   BiPAP nightly. Stable, on room air currently  4.  Hypokalemia Repleted.  5.  Hyperlipidemia Continue  statin.  6.  Paroxysmal atrial fibrillation -Heart rate is well controlled -On amiodarone,  No bradycardia noted overnight.  Continue amiodarone.   -Resume half home dose Toprol-XL.  - Continue IV heparin secondary to problem #1.   -Patient started on Coumadin for anticoagulation as patient had presented prior hospitalization with significant hematuria.   Outpatient follow-up with cardiology to discuss chronic anticoagulation options  7.  Postop urinary retention -Clear urine, Foley catheter in place  Status post Foley catheter placement.  Continue finasteride.  Patient was recently started on Bactrim given persistent leukocytosis with WBC trending down.  Repeat UA done with moderate leukocytes, nitrite negative, 21-50 WBCs.  Urine cultures with no growth to date.  Continue Bactrim for now.  Outpatient follow-up with urology.  8. History of chronic diastolic CHF -Remained stable, continue daily weights, I's and O's -Patient noted to be on the dry side on admission.  Patient was on gentle hydration which has subsequently been discontinued.  Monitor closely for volume overload.  Resume half home dose of Toprol-XL as heart rate improved and no longer bradycardic with improvement with blood pressure.  Follow.    DVT prophylaxis: Heparin Code Status: Full Family Communication: Updated patient.  No family at bedside. Disposition Plan: Transfer to telemetry.  Likely home once INR is therapeutic.  INR today 1.4, on heparin drip   Consultants:   None  Procedures:   2D echo 01/06/2019  Lower extremity Dopplers 01/06/2019    Antimicrobials:  Bactrim 01/05/2019 was started preadmission.  Objective: Vitals:   01/09/19 1957 01/09/19 2117 01/10/19 0540 01/10/19 1359  BP:  115/72 112/71 112/66  Pulse: 72 62 65 67  Resp: 15 16 16 16   Temp:  98.6 F (37 C) 98.5 F (36.9 C)  97.8 F (36.6 C)  TempSrc:  Oral Oral   SpO2: 96% 99% 97% 96%  Weight:      Height:        Intake/Output Summary (Last 24 hours) at 01/10/2019 1427 Last data filed at 01/10/2019 1400 Gross per 24 hour  Intake 2853.69 ml  Output 3525 ml  Net -671.31 ml   Filed Weights   01/05/19 1900 01/05/19 2300  Weight: 85.7 kg 81.2 kg    BP 112/66 (BP Location: Left Arm)   Pulse 67   Temp 97.8 F (36.6 C)   Resp 16   Ht 5\' 8"  (1.727 m)   Wt 81.2 kg   SpO2 96%   BMI 27.22 kg/m    Physical Exam  Constitution:  Alert, cooperative, no distress,  Psychiatric: Normal and stable mood and affect, cognition intact,   HEENT: Normocephalic, PERRL, otherwise with in Normal limits  Chest:Chest symmetric Cardio vascular:  S1/S2, RRR, No murmure, No Rubs or Gallops  pulmonary: Clear to auscultation bilaterally, respirations unlabored, negative wheezes / crackles Abdomen: Soft, non-tender, non-distended, bowel sounds,no masses, no organomegaly Muscular skeletal: Limited exam - in bed, able to move all 4 extremities, Normal strength,  Neuro: CNII-XII intact. , normal motor and sensation, reflexes intact  Extremities: No pitting edema lower extremities, +2 pulses  Skin: Dry, warm to touch, negative for any Rashes, No open wounds Wounds: per nursing documentation      Data Reviewed: I have personally reviewed following  labs and imaging studies  CBC: Recent Labs  Lab 01/05/19 1730 01/06/19 0157 01/07/19 0225 01/08/19 0242 01/09/19 0211 01/10/19 0409  WBC 14.6* 11.9* 11.2* 10.7* 11.6* 10.9*  NEUTROABS 11.2*  --   --   --   --   --   HGB 9.7* 8.9* 9.0* 9.3* 9.7* 10.2*  HCT 32.6* 29.3* 30.2* 30.2* 32.3* 33.6*  MCV 93.9 93.0 93.8 92.1 92.0 91.3  PLT 354 320 361 362 398 [id number]*   Basic Metabolic Panel: Recent Labs  Lab 01/06/19 0157 01/07/19 0225 01/08/19 0242 01/09/19 0211 01/10/19 0409  NA 140 140 139 136 135  K 3.4* 3.9 4.3 4.2 4.4  CL 112* 111 110 105 104  CO2 21* 20* 20* 22 21*   GLUCOSE 95 97 110* 116* 105*  BUN 9 9 10 12 10   CREATININE 0.97 0.83 0.94 0.91 0.95  CALCIUM 8.1* 8.4* 8.5* 8.7* 9.1  MG 2.2  --  2.1  --   --   PHOS 4.1  --   --   --   --    GFR: Estimated Creatinine Clearance: 72 mL/min (by C-G formula based on SCr of 0.95 mg/dL). Liver Function Tests: Recent Labs  Lab 01/05/19 1730 01/06/19 0157  AST 18 16  ALT 21 18  ALKPHOS 56 52  BILITOT 0.4 0.5  PROT 7.0 6.3*  ALBUMIN 2.9* 2.8*   No results for input(s): LIPASE, AMYLASE in the last 168 hours. No results for input(s): AMMONIA in the last 168 hours. Coagulation Profile: Recent Labs  Lab 01/05/19 1730 01/08/19 1241 01/09/19 0211 01/10/19 0409  INR 1.2 1.2 1.2 1.4*    Recent Results (from the past 240 hour(s))  SARS CORONAVIRUS 2 (TAT 6-24 HRS) Nasopharyngeal Nasopharyngeal Swab     Status: None   Collection Time: 01/05/19  6:20 PM   Specimen: Nasopharyngeal Swab  Result Value Ref Range Status   SARS Coronavirus 2 NEGATIVE NEGATIVE Final    Comment: (NOTE) SARS-CoV-2 target nucleic acids are NOT DETECTED. The SARS-CoV-2 RNA is generally detectable in upper and lower respiratory specimens during the acute phase of infection. Negative results do not preclude SARS-CoV-2 infection, do not rule out co-infections with other pathogens, and should not be used as the sole basis for treatment or other patient management decisions. Negative results must be combined with clinical observations, patient history, and epidemiological information. The expected result is Negative. Fact Sheet for Patients: SugarRoll.be Fact Sheet for Healthcare Providers: https://www.woods-mathews.com/ This test is not yet approved or cleared by the [place] FDA and  has been authorized for detection and/or diagnosis of SARS-CoV-2 by FDA under an Emergency Use Authorization (EUA). This EUA will remain  in effect (meaning this test can be used) for the duration of  the COVID-19 declaration under Section 56 4(b)(1) of the Act, 21 U.S.C. section 360bbb-3(b)(1), unless the authorization is terminated or revoked sooner. Performed at [facility], [place] [address] [place], [place] [address]   Culture, Urine     Status: None   Collection Time: 01/06/19  7:40 AM   Specimen: Urine, Clean Catch  Result Value Ref Range Status   Specimen Description   Final    URINE, CLEAN CATCH Performed at [facility], [place] [address] [place], [place] [address]    Special Requests   Final    NONE Performed at [facility], [place] [address], [place], [place] [address]    Culture   Final    NO GROWTH  Performed at [facility], [place] [address] [place], [place] [address]    Report Status 01/07/2019 FINAL  Final         Radiology Studies: No results found.      Scheduled Meds: . amiodarone  200 mg Oral Daily  . Chlorhexidine Gluconate Cloth  6 each Topical Daily  . ferrous sulfate  325 mg Oral Once per day on Tue Thu  . finasteride  5 mg Oral Daily  . metoprolol succinate  50 mg Oral Daily  . pantoprazole  40 mg Oral Daily  . rosuvastatin  20 mg Oral QHS  . senna-docusate  1 tablet Oral BID  . sulfamethoxazole-trimethoprim  1 tablet Oral BID  . Warfarin - Pharmacist Dosing Inpatient   Does not apply q1800   Continuous Infusions: . heparin 1,400 Units/hr (01/10/19 0600)     LOS: 5 days    Time spent: 40 minutes    Deatra James, MD [facility]  If 7PM-7AM, please contact night-coverage [email or web address] 01/10/2019, 2:27 PM

## 2019-01-10 NOTE — Telephone Encounter (Signed)
Pt is currently admitted. I will route to Guinevere Ferrari, CMA as she did try to reach pt 01/05/19.

## 2019-01-11 ENCOUNTER — Ambulatory Visit: Payer: BC Managed Care – PPO | Admitting: Physical Therapy

## 2019-01-11 DIAGNOSIS — I82403 Acute embolism and thrombosis of unspecified deep veins of lower extremity, bilateral: Secondary | ICD-10-CM

## 2019-01-11 DIAGNOSIS — E7800 Pure hypercholesterolemia, unspecified: Secondary | ICD-10-CM

## 2019-01-11 LAB — PROTIME-INR
INR: 1.8 — ABNORMAL HIGH (ref 0.8–1.2)
Prothrombin Time: 20.8 seconds — ABNORMAL HIGH (ref 11.4–15.2)

## 2019-01-11 LAB — HEPARIN LEVEL (UNFRACTIONATED): Heparin Unfractionated: 0.7 IU/mL (ref 0.30–0.70)

## 2019-01-11 LAB — CBC
HCT: 34.6 % — ABNORMAL LOW (ref 39.0–52.0)
Hemoglobin: 10.4 g/dL — ABNORMAL LOW (ref 13.0–17.0)
MCH: 27.6 pg (ref 26.0–34.0)
MCHC: 30.1 g/dL (ref 30.0–36.0)
MCV: 91.8 fL (ref 80.0–100.0)
Platelets: 426 10*3/uL — ABNORMAL HIGH (ref 150–400)
RBC: 3.77 MIL/uL — ABNORMAL LOW (ref 4.22–5.81)
RDW: 15 % (ref 11.5–15.5)
WBC: 8.9 10*3/uL (ref 4.0–10.5)
nRBC: 0 % (ref 0.0–0.2)

## 2019-01-11 LAB — GLUCOSE, CAPILLARY: Glucose-Capillary: 134 mg/dL — ABNORMAL HIGH (ref 70–99)

## 2019-01-11 MED ORDER — WARFARIN SODIUM 2.5 MG PO TABS
2.5000 mg | ORAL_TABLET | Freq: Once | ORAL | Status: AC
  Administered 2019-01-11: 2.5 mg via ORAL
  Filled 2019-01-11: qty 1

## 2019-01-11 MED ORDER — HEPARIN (PORCINE) 25000 UT/250ML-% IV SOLN
1300.0000 [IU]/h | INTRAVENOUS | Status: DC
  Administered 2019-01-11: 1350 [IU]/h via INTRAVENOUS
  Administered 2019-01-12: 1300 [IU]/h via INTRAVENOUS
  Filled 2019-01-11: qty 250

## 2019-01-11 NOTE — Progress Notes (Signed)
[facility]                                                                              Patient Demographics  Philip Jackson, is a 68 y.o. male, DOB - 1950/07/14, [id number]  Admit date - 01/05/2019   Admitting Physician Toy Baker, MD  Outpatient Primary MD for the patient is Hamrick, Lorin Mercy, MD  Outpatient specialists:   LOS - 6  days   Medical records reviewed and are as summarized below:    Chief Complaint  Patient presents with   pulmonary emboli       Brief summary   HPI per Dr. Luan Moore Stewartis a 68 y.o.malewith medical history significant of PSVT, HTN, OSAon CPAP hx of pA.fib on Eliquis in the past, GERD, Knee replacement on August 3rd Presented withworsening shortness of breath patient was seen by primary care provider had a CAT scan done which showed evidence of PE he was sent to emergency department  Prior to that has been admitted forsepsis from intra-abdominal cause at that time he had possible contained duodenal perforation. Also had acute kidney injury with hematuria and urine retention. At that time urology saw the patient and he was discharged home. Of note patient had recent UTI diagnosis an outpatient was pansensitive he completed 5 days of Keflex HE returned with hypotension due to hematuria and neededemergent blood transfusion as there was 5 g drop in hemoglobin with frank hematuria. CT scan showed bladder mass versus clot. Urology was consulted and continuous bladder irrigation started. Hospital course complicated by atrial fibrillation with RVR therefore cardiology consulted. Cystoscopy, completed on 9/11/2020with clot evacuation   He was discharged home on 9/14 at the time of discharge Hg 9.0Continue Foley catheter, and finasteride. -------He was no longer on Eliquis  Since then he had worsening shortness of breath family states he was shortness of breath even prior to discharge, No leg swelling, no chest  pain   Urology have seen him as an outpatient he was able to urinate on his own  He still got a foley catheter   Assessment & Plan    Principal Problem:   Bilateral pulmonary embolism ([place]), acute bilateral lower extremity DVT -INR still subtherapeutic, continue heparin drip, Coumadin -Foley catheter clear, no overt bleeding -2D echo with EF of 6065%, mildly increased LVH left ventricular diastolic Doppler parameters consistent with impaired relaxation pattern of LV diastolic filling, global right ventricle has moderately reduced systolic function, right ventricular size severely enlarged. -Lower extremity Dopplers consistent with bilateral DVT. -Goal INR 2-3 -Outpatient follow-up and if no further bleeding episodes in future, may transition back to NOAC.  If he starts rebleeding, may need IVC filter placement   Active Problems: Hypertension Currently stable, continue Toprol-XL  OSA Stable, continue BiPAP nightly  Hyperlipidemia Continue statin  Paroxysmal atrial fibrillation Rate controlled, continue half dose of Toprol-XL, amiodarone.  No bradycardia noted overnight. -Continue Coumadin per pharmacy Outpatient follow-up with cardiology to discuss chronic anticoagulation options  Postop urinary retention with hematuria -Currently clear urine no hematuria, Foley catheter in place -Continue finasteride, continue Bactrim for now -Patient has out patient follow-up with urology on  Tuesday, 0000000  Chronic diastolic CHF Currently stable, euvolemic  Code Status: Full code DVT Prophylaxis: Heparin plus Coumadin Family Communication: Discussed all imaging results, lab results, explained to the patient   Disposition Plan: Hopefully INR above 2 tomorrow, possible DC home  Time Spent in minutes 35 minutes  Procedures:  2D echo  Consultants:   None  Antimicrobials:   Anti-infectives (From admission, onward)   Start     Dose/Rate Route Frequency Ordered Stop    01/08/19 2200  sulfamethoxazole-trimethoprim (BACTRIM DS) 800-160 MG per tablet 1 tablet     1 tablet Oral 2 times daily 01/08/19 1012 01/14/19 0959   01/05/19 2200  sulfamethoxazole-trimethoprim (BACTRIM DS) 800-160 MG per tablet 1 tablet  Status:  Discontinued     1 tablet Oral 2 times daily 01/05/19 2152 01/08/19 1012          Medications  Scheduled Meds:  amiodarone  200 mg Oral Daily   Chlorhexidine Gluconate Cloth  6 each Topical Daily   ferrous sulfate  325 mg Oral Once per day on Tue Thu   finasteride  5 mg Oral Daily   metoprolol succinate  50 mg Oral Daily   pantoprazole  40 mg Oral Daily   rosuvastatin  20 mg Oral QHS   senna-docusate  1 tablet Oral BID   sulfamethoxazole-trimethoprim  1 tablet Oral BID   Warfarin - Pharmacist Dosing Inpatient   Does not apply q1800   Continuous Infusions:  heparin 1,350 Units/hr (01/11/19 0748)   PRN Meds:.acetaminophen **OR** acetaminophen, clobetasol cream, HYDROcodone-acetaminophen, methocarbamol, ondansetron **OR** ondansetron (ZOFRAN) IV, oxyCODONE, polyvinyl alcohol      Subjective:   Philip Jackson was seen and examined today.  No complaints per patient, no chest pain at this time.  Heart rate controlled.  Patient denies dizziness,  abdominal pain, N/V/D/C, new weakness, numbess, tingling. No acute events overnight.    Objective:   Vitals:   01/10/19 1359 01/10/19 2054 01/11/19 0552 01/11/19 1052  BP: 112/66 119/64 114/64 117/89  Pulse: 67 64 60 69  Resp: 16 18 16    Temp: 97.8 F (36.6 C) 98.8 F (37.1 C) 98.1 F (36.7 C)   TempSrc:  Oral Oral   SpO2: 96% 97% 91%   Weight:      Height:        Intake/Output Summary (Last 24 hours) at 01/11/2019 1525 Last data filed at 01/11/2019 1250 Gross per 24 hour  Intake 1255.44 ml  Output 2375 ml  Net -1119.56 ml     Wt Readings from Last 3 Encounters:  01/05/19 81.2 kg  12/22/18 85.7 kg  12/19/18 86.1 kg     Exam  General: Alert and oriented x 3,  NAD  Eyes:   HEENT:  Atraumatic, normocephalic  Cardiovascular: S1 S2 auscultated, no murmurs, RRR  Respiratory: Clear to auscultation bilaterally, no wheezing, rales or rhonchi  Gastrointestinal: Soft, nontender, nondistended, + bowel sounds  Ext: no pedal edema bilaterally  Neuro: AAOx3, Cr N's II- XII. Strength 5/5 upper and lower extremities bilaterally  Musculoskeletal: No digital cyanosis, clubbing  Skin: No rashes  Psych: Normal affect and demeanor, alert and oriented x3    Data Reviewed:  I have personally reviewed following labs and imaging studies  Micro Results Recent Results (from the past 240 hour(s))  SARS CORONAVIRUS 2 (TAT 6-24 HRS) Nasopharyngeal Nasopharyngeal Swab     Status: None   Collection Time: 01/05/19  6:20 PM   Specimen: Nasopharyngeal Swab  Result Value Ref Range Status  SARS Coronavirus 2 NEGATIVE NEGATIVE Final    Comment: (NOTE) SARS-CoV-2 target nucleic acids are NOT DETECTED. The SARS-CoV-2 RNA is generally detectable in upper and lower respiratory specimens during the acute phase of infection. Negative results do not preclude SARS-CoV-2 infection, do not rule out co-infections with other pathogens, and should not be used as the sole basis for treatment or other patient management decisions. Negative results must be combined with clinical observations, patient history, and epidemiological information. The expected result is Negative. Fact Sheet for Patients: SugarRoll.be Fact Sheet for Healthcare Providers: https://www.woods-mathews.com/ This test is not yet approved or cleared by the [place] FDA and  has been authorized for detection and/or diagnosis of SARS-CoV-2 by FDA under an Emergency Use Authorization (EUA). This EUA will remain  in effect (meaning this test can be used) for the duration of the COVID-19 declaration under Section 56 4(b)(1) of the Act, 21 U.S.C. section  360bbb-3(b)(1), unless the authorization is terminated or revoked sooner. Performed at [facility], [place] [address] [place], [place] [address]   Culture, Urine     Status: None   Collection Time: 01/06/19  7:40 AM   Specimen: Urine, Clean Catch  Result Value Ref Range Status   Specimen Description   Final    URINE, CLEAN CATCH Performed at [facility], [place] [address], [place], [place] [address]    Special Requests   Final    NONE Performed at [facility], [place] [address], [place], [place] [address]    Culture   Final    NO GROWTH Performed at [facility], [place] [address] [place], [place] [address]    Report Status 01/07/2019 FINAL  Final    Radiology Reports Ct Angio Chest Pe W Or Wo Contrast  Result Date: 01/05/2019 CLINICAL DATA:  Shortness of breath. Dyspnea on exertion. EXAM: CT ANGIOGRAPHY CHEST WITH CONTRAST TECHNIQUE: Multidetector CT imaging of the chest was performed using the standard protocol during bolus administration of intravenous contrast. Multiplanar CT image reconstructions and MIPs were obtained to evaluate the vascular anatomy. CONTRAST:  49mL ISOVUE-370 IOPAMIDOL (ISOVUE-370) INJECTION 76% COMPARISON:  Chest x-ray dated 12/22/2018 FINDINGS: Cardiovascular: There are multiple bilateral pulmonary emboli to all 3 lobes on the right and both lobes on the left. RV LV ratio is normal. The heart size is normal. No pericardial effusion. Mediastinum/Nodes: No enlarged mediastinal, hilar, or axillary lymph nodes. Thyroid gland, trachea, and esophagus demonstrate no significant findings. Lungs/Pleura: Tiny bilateral pleural effusions. Minimal atelectasis at the lung bases posteriorly. Lungs are otherwise clear. Upper Abdomen: Normal. Musculoskeletal: No chest wall abnormality. No acute or significant osseous findings. Review of the MIP images confirms the above findings. IMPRESSION: 1. Multiple bilateral pulmonary emboli. 2.  Tiny bilateral pleural effusions. Critical Value/emergent results were called by telephone at the time of interpretation on 01/05/2019 at 4:25 pm to providerKATHLEEN ZELLER , who verbally acknowledged these results. The patient was advised to go straight to the [facility] emergency room for treatment. Electronically Signed   By: Lorriane Shire M.D.   On: 01/05/2019 16:29   Ct Abdomen Pelvis W Contrast  Result Date: 12/22/2018 CLINICAL DATA:  Abdominal distention EXAM: CT ABDOMEN AND PELVIS WITH CONTRAST TECHNIQUE: Multidetector CT imaging of the abdomen and pelvis was performed using the standard protocol following bolus administration of intravenous contrast. CONTRAST:  197mL OMNIPAQUE IOHEXOL 300 MG/ML  SOLN COMPARISON:  12/03/2018 FINDINGS: Lower chest: Coronary artery calcifications.  No acute abnormality. Hepatobiliary: No  focal hepatic abnormality. Gallbladder unremarkable. Pancreas: No focal abnormality or ductal dilatation. Spleen: No focal abnormality.  Normal size. Adrenals/Urinary Tract: Mild left hydronephrosis. No hydronephrosis on the right. Adrenal glands unremarkable. There is a large abnormal filling defect filling much of the urinary bladder. This could reflect mass or blood clot. Foley catheter is present within the bladder. Stomach/Bowel: Normal appendix. Scattered colonic diverticulosis. No active diverticulitis. Stomach and small bowel decompressed, unremarkable. Vascular/Lymphatic: Aortic atherosclerosis. No enlarged abdominal or pelvic lymph nodes. Reproductive: Prostate enlargement. Other: No free fluid or free air. Musculoskeletal: No acute bony abnormality. IMPRESSION: Large irregular filling deflect within the urinary bladder which could reflect large bladder wall mass or blood clot. There is mild left hydroureteronephrosis. This could be further evaluated with cystoscopy. Aortic atherosclerosis.  Coronary artery disease. Colonic diverticulosis. Electronically Signed   By: Rolm Baptise  M.D.   On: 12/22/2018 21:47   Dg Chest Portable 1 View  Result Date: 12/22/2018 CLINICAL DATA:  Hypotension EXAM: PORTABLE CHEST 1 VIEW COMPARISON:  November 30, 2018 FINDINGS: The heart size and mediastinal contours are within normal limits. Both lungs are clear. The visualized skeletal structures are unremarkable. IMPRESSION: No active disease. Electronically Signed   By: Constance Holster M.D.   On: 12/22/2018 20:28   Dg Abd Portable 1v  Result Date: 12/24/2018 CLINICAL DATA:  68 year old male with abdominal distention. EXAM: PORTABLE ABDOMEN - 1 VIEW COMPARISON:  CT of the abdomen pelvis dated 12/22/2018. FINDINGS: Borderline dilated air-filled loops of small bowel in the upper abdomen may represent mild ileus. Clinical correlation is recommended. Stool noted throughout the colon. No free air identified. There is degenerative changes of the spine. Rounded density over the pelvis most consistent with blood clot full urinary bladder seen on the recent CT. IMPRESSION: 1. Borderline dilated air-filled loops of small bowel in the upper abdomen may represent mild ileus. 2. Density over the pelvis in keeping with blood filled urinary bladder. Electronically Signed   By: Anner Crete M.D.   On: 12/24/2018 23:27   Vas [place] Lower Extremity Venous (dvt)  Result Date: 01/07/2019  Lower Venous Study Indications: Pulmonary embolism.  Comparison Study: no prior Performing Technologist: June Leap RDMS, RVT  Examination Guidelines: A complete evaluation includes B-mode imaging, spectral Doppler, color Doppler, and power Doppler as needed of all accessible portions of each vessel. Bilateral testing is considered an integral part of a complete examination. Limited examinations for reoccurring indications may be performed as noted.  +---------+---------------+---------+-----------+----------+-------------------+  RIGHT     Compressibility Phasicity Spontaneity Properties Thrombus Aging        +---------+---------------+---------+-----------+----------+-------------------+  CFV       Full            Yes       Yes                                         +---------+---------------+---------+-----------+----------+-------------------+  SFJ       Full                                                                  +---------+---------------+---------+-----------+----------+-------------------+  FV Prox   Full                                                                  +---------+---------------+---------+-----------+----------+-------------------+  FV Mid    Full                                                                  +---------+---------------+---------+-----------+----------+-------------------+  FV Distal Full                                                                  +---------+---------------+---------+-----------+----------+-------------------+  PFV       Full                                                                  +---------+---------------+---------+-----------+----------+-------------------+  POP       Full            Yes       Yes                                         +---------+---------------+---------+-----------+----------+-------------------+  PTV       None                                             isolated DVT                                                                     proximal segment     +---------+---------------+---------+-----------+----------+-------------------+  PERO      Full                                                                  +---------+---------------+---------+-----------+----------+-------------------+   +---------+---------------+---------+-----------+----------+------------------+  LEFT      Compressibility Phasicity Spontaneity Properties Thrombus Aging      +---------+---------------+---------+-----------+----------+------------------+  CFV       Full            Yes       Yes                                         +---------+---------------+---------+-----------+----------+------------------+  SFJ       Full                                                                 +---------+---------------+---------+-----------+----------+------------------+  FV Prox   Full                                                                 +---------+---------------+---------+-----------+----------+------------------+  FV Mid    Full                                                                 +---------+---------------+---------+-----------+----------+------------------+  FV Distal Full                                                                 +---------+---------------+---------+-----------+----------+------------------+  PFV       Full                                                                 +---------+---------------+---------+-----------+----------+------------------+  POP       Full            Yes       Yes                                        +---------+---------------+---------+-----------+----------+------------------+  PTV       Full                                                                 +---------+---------------+---------+-----------+----------+------------------+  PERO      None                                             isolated DVT mid                                                                segment             +---------+---------------+---------+-----------+----------+------------------+     Summary: Right: Findings consistent with acute deep vein thrombosis involving the right posterior tibial veins. A cystic structure is found in the popliteal fossa. Left: Findings consistent with acute deep vein thrombosis involving the left peroneal veins.  No cystic structure found in the popliteal fossa.  *See table(s) above for measurements and observations. Electronically signed by Ruta Hinds MD on 01/07/2019 at 11:05:32 AM.    Final     Lab Data:  CBC: Recent Labs  Lab 01/05/19 1730   01/07/19 0225 01/08/19 0242 01/09/19 0211 01/10/19 0409 01/11/19 0448  WBC 14.6*   < > 11.2* 10.7* 11.6* 10.9* 8.9  NEUTROABS 11.2*  --   --   --   --   --   --   HGB 9.7*   < > 9.0* 9.3* 9.7* 10.2* 10.4*  HCT 32.6*   < > 30.2* 30.2* 32.3* 33.6* 34.6*  MCV 93.9   < > 93.8 92.1 92.0 91.3 91.8  PLT 354   < > 361 362 398 421* 426*   < > = values in this interval not displayed.   Basic Metabolic Panel: Recent Labs  Lab 01/06/19 0157 01/07/19 0225 01/08/19 0242 01/09/19 0211 01/10/19 0409  NA 140 140 139 136 135  K 3.4* 3.9 4.3 4.2 4.4  CL 112* 111 110 105 104  CO2 21* 20* 20* 22 21*  GLUCOSE 95 97 110* 116* 105*  BUN 9 9 10 12 10   CREATININE 0.97 0.83 0.94 0.91 0.95  CALCIUM 8.1* 8.4* 8.5* 8.7* 9.1  MG 2.2  --  2.1  --   --   PHOS 4.1  --   --   --   --    GFR: Estimated Creatinine Clearance: 72 mL/min (by C-G formula based on SCr of 0.95 mg/dL). Liver Function Tests: Recent Labs  Lab 01/05/19 1730 01/06/19 0157  AST 18 16  ALT 21 18  ALKPHOS 56 52  BILITOT 0.4 0.5  PROT 7.0 6.3*  ALBUMIN 2.9* 2.8*   No results for input(s): LIPASE, AMYLASE in the last 168 hours. No results for input(s): AMMONIA in the last 168 hours. Coagulation Profile: Recent Labs  Lab 01/05/19 1730 01/08/19 1241 01/09/19 0211 01/10/19 0409 01/11/19 0448  INR 1.2 1.2 1.2 1.4* 1.8*   Cardiac Enzymes: No results for input(s): CKTOTAL, CKMB, CKMBINDEX, TROPONINI in the last 168 hours. BNP (last 3 results) No results for input(s): PROBNP in the last 8760 hours. HbA1C: No results for input(s): HGBA1C in the last 72 hours. CBG: No results for input(s): GLUCAP in the last 168 hours. Lipid Profile: No results for input(s): CHOL, HDL, LDLCALC, TRIG, CHOLHDL, LDLDIRECT in the last 72 hours. Thyroid Function Tests: No results for input(s): TSH, T4TOTAL, FREET4, T3FREE, THYROIDAB in the last 72 hours. Anemia Panel: No results for input(s): VITAMINB12, FOLATE, FERRITIN, TIBC, IRON, RETICCTPCT  in the last 72 hours. Urine analysis:    Component Value Date/Time   COLORURINE YELLOW 01/05/2019 2047   APPEARANCEUR CLEAR 01/05/2019 2047   LABSPEC >1.046 (H) 01/05/2019 2047   PHURINE 6.0 01/05/2019 2047   GLUCOSEU NEGATIVE 01/05/2019 2047   HGBUR SMALL (A) 01/05/2019 2047   BILIRUBINUR NEGATIVE 01/05/2019 2047   KETONESUR NEGATIVE 01/05/2019 2047   PROTEINUR 100 (A) 01/05/2019 2047   NITRITE NEGATIVE 01/05/2019 2047   LEUKOCYTESUR MODERATE (A) 01/05/2019 2047     Brittony Billick M.D. [facility] 01/11/2019, 3:25 PM  Pager: [phone] Between 7am to 7pm - call Pager - [phone]  After 7pm go to [email or web address] - password [email or web address]  Call night coverage person covering after 7pm

## 2019-01-11 NOTE — TOC Progression Note (Signed)
Transition of Care [facility]) - Progression Note    Patient Details  Name: Philip Jackson MRN: [id number] Date of Birth: May 19, 1950  [facility] CM/SW Contact  Purcell Mouton, RN Phone Number: 01/11/2019, 2:25 PM  Clinical Narrative:    Pt plan to discharge to home. OP/PT is recommended to pt.         Expected Discharge Plan and Services                                                 Social Determinants of Health (SDOH) Interventions    Readmission Risk Interventions No flowsheet data found.

## 2019-01-11 NOTE — Progress Notes (Addendum)
[place] for IV UFH, Warfarin Indication: PE/DVT  Allergies  Allergen Reactions  . 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 Nsaids     Duodenal ulcer.  anticoagulated   Patient Measurements: Height: 5\' 8"  (172.7 cm) Weight: 179 lb 0.2 oz (81.2 kg) IBW/kg (Calculated) : 68.4 Heparin Dosing Weight: actual body weight   Vital Signs: Temp: 98.1 F (36.7 C) (09/24 0552) Temp Source: Oral (09/24 0552) BP: 114/64 (09/24 0552) Pulse Rate: 60 (09/24 0552)  Labs: Recent Labs    01/09/19 0211  01/10/19 0409 01/10/19 1949 01/11/19 0448  HGB 9.7*  --  10.2*  --  10.4*  HCT 32.3*  --  33.6*  --  34.6*  PLT 398  --  421*  --  426*  LABPROT 14.9  --  16.5*  --  20.8*  INR 1.2  --  1.4*  --  1.8*  HEPARINUNFRC 0.24*   < > 0.37 0.54 0.70  CREATININE 0.91  --  0.95  --   --    < > = values in this interval not displayed.   Estimated Creatinine Clearance: 72 mL/min (by C-G formula based on SCr of 0.95 mg/dL).  Assessment:  53 yoM with multiple PEs per CTa on 01/05/2019. LE dopplers also consistent with bilateral DVT. Pharmacy consulted for IV heparin dosing. Patient was previously on apixaban for hx of a-fib but was held due to hospital admission for acute blood loss anemia, hemorrhagic shock secondary to hematuria. Baseline labs: aPTT 35 sec, INR 1.2, Hgb 9.7.   Today, 01/11/2019:  Day 7 of Heparin, Day 4 of Warfarin   Heparin level = 0.7 units/mL, now on upper end of goal range  INR 1.8, remains subtherapeutic but trending up  Hgb low/improved to 10.4, Pltc elevated   No infusion issues noted per nursing  Per RN, small amount of blood present in urine. MD made aware. No further issues since then.   Major DDIs with Warfarin: Septra DS (through 9/26), Amiodarone  Goal of Therapy:  Heparin level 0.3-0.7 units/ml  INR 2-3 Monitor platelets by anticoagulation protocol: Yes   Plan:   Decrease heparin infusion slightly to 1350  units/hr  Warfarin 2.5mg  PO x 1 today   Daily CBC, INR, and heparin level  Monitor closely for any further s/sx of bleeding   Lindell Spar, PharmD, BCPS Clinical Pharmacist  01/11/2019, 7:29 AM

## 2019-01-11 NOTE — Progress Notes (Signed)
Small amount of blood present in urine.  Notified MD.  Will monitor for increased bleeding. Andre Lefort

## 2019-01-11 NOTE — Plan of Care (Signed)
  Problem: Clinical Measurements: Goal: Respiratory complications will improve Outcome: Progressing Goal: Cardiovascular complication will be avoided Outcome: Progressing   Problem: Activity: Goal: Risk for activity intolerance will decrease Outcome: Progressing   Problem: Coping: Goal: Level of anxiety will decrease Outcome: Progressing   Problem: Pain Managment: Goal: General experience of comfort will improve Outcome: Progressing   Problem: Safety: Goal: Ability to remain free from injury will improve Outcome: Progressing   

## 2019-01-12 LAB — CBC
HCT: 34.4 % — ABNORMAL LOW (ref 39.0–52.0)
Hemoglobin: 10.6 g/dL — ABNORMAL LOW (ref 13.0–17.0)
MCH: 27.9 pg (ref 26.0–34.0)
MCHC: 30.8 g/dL (ref 30.0–36.0)
MCV: 90.5 fL (ref 80.0–100.0)
Platelets: 403 10*3/uL — ABNORMAL HIGH (ref 150–400)
RBC: 3.8 MIL/uL — ABNORMAL LOW (ref 4.22–5.81)
RDW: 14.9 % (ref 11.5–15.5)
WBC: 10 10*3/uL (ref 4.0–10.5)
nRBC: 0 % (ref 0.0–0.2)

## 2019-01-12 LAB — PROTIME-INR
INR: 2.1 — ABNORMAL HIGH (ref 0.8–1.2)
Prothrombin Time: 23 seconds — ABNORMAL HIGH (ref 11.4–15.2)

## 2019-01-12 LAB — HEPARIN LEVEL (UNFRACTIONATED): Heparin Unfractionated: 0.69 IU/mL (ref 0.30–0.70)

## 2019-01-12 MED ORDER — WARFARIN SODIUM 2.5 MG PO TABS
2.5000 mg | ORAL_TABLET | Freq: Once | ORAL | Status: AC
  Administered 2019-01-12: 2.5 mg via ORAL
  Filled 2019-01-12: qty 1

## 2019-01-12 MED ORDER — ZOLPIDEM TARTRATE 5 MG PO TABS
5.0000 mg | ORAL_TABLET | Freq: Every day | ORAL | Status: DC
  Administered 2019-01-12 – 2019-01-13 (×2): 5 mg via ORAL
  Filled 2019-01-12 (×2): qty 1

## 2019-01-12 NOTE — Progress Notes (Signed)
[facility]                                                                              Patient Demographics  Philip Jackson, is a 68 y.o. male, DOB - 02-14-1951, [id number]  Admit date - 01/05/2019   Admitting Physician Toy Baker, MD  Outpatient Primary MD for the patient is Hamrick, Lorin Mercy, MD  Outpatient specialists:   LOS - 7  days   Medical records reviewed and are as summarized below:    Chief Complaint  Patient presents with   pulmonary emboli       Brief summary   HPI per Dr. Luan Moore Stewartis a 68 y.o.malewith medical history significant of PSVT, HTN, OSAon CPAP hx of pA.fib on Eliquis in the past, GERD, Knee replacement on August 3rd Presented withworsening shortness of breath patient was seen by primary care provider had a CAT scan done which showed evidence of PE he was sent to emergency department  Prior to that has been admitted forsepsis from intra-abdominal cause at that time he had possible contained duodenal perforation. Also had acute kidney injury with hematuria and urine retention. At that time urology saw the patient and he was discharged home. Of note patient had recent UTI diagnosis an outpatient was pansensitive he completed 5 days of Keflex HE returned with hypotension due to hematuria and neededemergent blood transfusion as there was 5 g drop in hemoglobin with frank hematuria. CT scan showed bladder mass versus clot. Urology was consulted and continuous bladder irrigation started. Hospital course complicated by atrial fibrillation with RVR therefore cardiology consulted. Cystoscopy, completed on 9/11/2020with clot evacuation   He was discharged home on 9/14 at the time of discharge Hg 9.0Continue Foley catheter, and finasteride. -------He was no longer on Eliquis  Since then he had worsening shortness of breath family states he was shortness of breath even prior to discharge, No leg swelling, no chest  pain   Urology have seen him as an outpatient he was able to urinate on his own  He still got a foley catheter   Assessment & Plan    Principal Problem:   Bilateral pulmonary embolism ([place]), acute bilateral lower extremity DVT -Patient was started on IV heparin and Coumadin, goal INR 2-3 -Foley catheter clear, no overt bleeding -2D echo with EF of 6065%, mildly increased LVH left ventricular diastolic Doppler parameters consistent with impaired relaxation pattern of LV diastolic filling, global right ventricle has moderately reduced systolic function, right ventricular size severely enlarged. -Lower extremity Dopplers consistent with bilateral DVT. -Outpatient follow-up and if no further bleeding episodes in future, may transition back to NOAC.  If he starts rebleeding, may need IVC filter placement -INR 2.1 today however needs 5 days of overlap with Coumadin, also on Bactrim which can affect INR.  Discussed in detail with pharmacy, will continue IV heparin today, the patient will finish Bactrim dosing tomorrow.  If INR between 2 and 3 tomorrow, will DC home on Coumadin.   Active Problems: Hypertension BP stable, continue Toprol-XL  OSA Stable, continue BiPAP nightly  Hyperlipidemia Continue statin  Paroxysmal atrial fibrillation Rate currently controlled, continue half  dose of Toprol-XL, amiodarone.  No further bradycardia.  -Continue Coumadin per pharmacy Outpatient follow-up with cardiology to discuss chronic anticoagulation options  Postop urinary retention with hematuria - Foley catheter in place.  Urology following. -Continue finasteride, continue Bactrim for now, will finish Bactrim dosing tomorrow -States overnight had flecks of hematuria, but cleared now (per patient, he was told by urology that this is expected, he has urology appointment on Tuesday 9/29).  Chronic diastolic CHF Currently stable, euvolemic  Code Status: Full code DVT Prophylaxis: Heparin plus  Coumadin Family Communication: Discussed all imaging results, lab results, explained to the patient   Disposition Plan: Possible DC home tomorrow  Time Spent in minutes 35 minutes  Procedures:  2D echo  Consultants:   None  Antimicrobials:   Anti-infectives (From admission, onward)   Start     Dose/Rate Route Frequency Ordered Stop   01/08/19 2200  sulfamethoxazole-trimethoprim (BACTRIM DS) 800-160 MG per tablet 1 tablet     1 tablet Oral 2 times daily 01/08/19 1012 01/14/19 0959   01/05/19 2200  sulfamethoxazole-trimethoprim (BACTRIM DS) 800-160 MG per tablet 1 tablet  Status:  Discontinued     1 tablet Oral 2 times daily 01/05/19 2152 01/08/19 1012         Medications  Scheduled Meds:  amiodarone  200 mg Oral Daily   Chlorhexidine Gluconate Cloth  6 each Topical Daily   ferrous sulfate  325 mg Oral Once per day on Tue Thu   finasteride  5 mg Oral Daily   metoprolol succinate  50 mg Oral Daily   pantoprazole  40 mg Oral Daily   rosuvastatin  20 mg Oral QHS   senna-docusate  1 tablet Oral BID   sulfamethoxazole-trimethoprim  1 tablet Oral BID   Warfarin - Pharmacist Dosing Inpatient   Does not apply q1800   Continuous Infusions:  heparin 1,300 Units/hr (01/12/19 1030)   PRN Meds:.acetaminophen **OR** acetaminophen, clobetasol cream, HYDROcodone-acetaminophen, methocarbamol, ondansetron **OR** ondansetron (ZOFRAN) IV, oxyCODONE, polyvinyl alcohol      Subjective:   Philip Jackson was seen and examined today.  Tired and fatigued, did not sleep enough last night.  No bradycardia overnight.  No chest pain.   Patient denies dizziness,  abdominal pain, N/V/D/C, new weakness, numbess, tingling.   Objective:   Vitals:   01/11/19 2048 01/11/19 2054 01/12/19 0522 01/12/19 0909  BP:  125/78 105/67   Pulse: 78 65 (!) 59 70  Resp: 15  18   Temp:  99.5 F (37.5 C) 98 F (36.7 C)   TempSrc:  Oral Oral   SpO2: 96% 95% 97%   Weight:      Height:         Intake/Output Summary (Last 24 hours) at 01/12/2019 1559 Last data filed at 01/12/2019 1500 Gross per 24 hour  Intake 1175.97 ml  Output 1675 ml  Net -499.03 ml     Wt Readings from Last 3 Encounters:  01/05/19 81.2 kg  12/22/18 85.7 kg  12/19/18 86.1 kg   Physical Exam  General: Alert and oriented x 3, NAD  Eyes:   HEENT:  Atraumatic, normocephalic  Cardiovascular: S1 S2 clear,, RRR. No pedal edema b/l  Respiratory: CTAB, no wheezing, rales or rhonchi  Gastrointestinal: Soft, nontender, nondistended, NBS  Ext: no pedal edema bilaterally  Neuro: no new deficits  Musculoskeletal: No cyanosis, clubbing  Skin: No rashes  Psych: Normal affect and demeanor, alert and oriented x3   Data Reviewed:  I have personally reviewed following labs  and imaging studies  Micro Results Recent Results (from the past 240 hour(s))  SARS CORONAVIRUS 2 (TAT 6-24 HRS) Nasopharyngeal Nasopharyngeal Swab     Status: None   Collection Time: 01/05/19  6:20 PM   Specimen: Nasopharyngeal Swab  Result Value Ref Range Status   SARS Coronavirus 2 NEGATIVE NEGATIVE Final    Comment: (NOTE) SARS-CoV-2 target nucleic acids are NOT DETECTED. The SARS-CoV-2 RNA is generally detectable in upper and lower respiratory specimens during the acute phase of infection. Negative results do not preclude SARS-CoV-2 infection, do not rule out co-infections with other pathogens, and should not be used as the sole basis for treatment or other patient management decisions. Negative results must be combined with clinical observations, patient history, and epidemiological information. The expected result is Negative. Fact Sheet for Patients: SugarRoll.be Fact Sheet for Healthcare Providers: https://www.woods-mathews.com/ This test is not yet approved or cleared by the [place] FDA and  has been authorized for detection and/or diagnosis of SARS-CoV-2 by FDA  under an Emergency Use Authorization (EUA). This EUA will remain  in effect (meaning this test can be used) for the duration of the COVID-19 declaration under Section 56 4(b)(1) of the Act, 21 U.S.C. section 360bbb-3(b)(1), unless the authorization is terminated or revoked sooner. Performed at [facility], [place] [address] [place], [place] [address]   Culture, Urine     Status: None   Collection Time: 01/06/19  7:40 AM   Specimen: Urine, Clean Catch  Result Value Ref Range Status   Specimen Description   Final    URINE, CLEAN CATCH Performed at [facility], [place] [address], [place], [place] [address]    Special Requests   Final    NONE Performed at [facility], [place] [address], [place], [place] [address]    Culture   Final    NO GROWTH Performed at [facility], [place] [address], [place], [place] [address]    Report Status 01/07/2019 FINAL  Final    Radiology Reports Ct Angio Chest Pe W Or Wo Contrast  Result Date: 01/05/2019 CLINICAL DATA:  Shortness of breath. Dyspnea on exertion. EXAM: CT ANGIOGRAPHY CHEST WITH CONTRAST TECHNIQUE: Multidetector CT imaging of the chest was performed using the standard protocol during bolus administration of intravenous contrast. Multiplanar CT image reconstructions and MIPs were obtained to evaluate the vascular anatomy. CONTRAST:  36mL ISOVUE-370 IOPAMIDOL (ISOVUE-370) INJECTION 76% COMPARISON:  Chest x-ray dated 12/22/2018 FINDINGS: Cardiovascular: There are multiple bilateral pulmonary emboli to all 3 lobes on the right and both lobes on the left. RV LV ratio is normal. The heart size is normal. No pericardial effusion. Mediastinum/Nodes: No enlarged mediastinal, hilar, or axillary lymph nodes. Thyroid gland, trachea, and esophagus demonstrate no significant findings. Lungs/Pleura: Tiny bilateral pleural effusions. Minimal atelectasis at the lung bases posteriorly. Lungs are otherwise clear. Upper  Abdomen: Normal. Musculoskeletal: No chest wall abnormality. No acute or significant osseous findings. Review of the MIP images confirms the above findings. IMPRESSION: 1. Multiple bilateral pulmonary emboli. 2. Tiny bilateral pleural effusions. Critical Value/emergent results were called by telephone at the time of interpretation on 01/05/2019 at 4:25 pm to providerKATHLEEN ZELLER , who verbally acknowledged these results. The patient was advised to go straight to the [facility] emergency room for treatment. Electronically Signed   By: Lorriane Shire M.D.   On: 01/05/2019 16:29   Ct Abdomen Pelvis W Contrast  Result Date: 12/22/2018 CLINICAL DATA:  Abdominal distention EXAM: CT  ABDOMEN AND PELVIS WITH CONTRAST TECHNIQUE: Multidetector CT imaging of the abdomen and pelvis was performed using the standard protocol following bolus administration of intravenous contrast. CONTRAST:  175mL OMNIPAQUE IOHEXOL 300 MG/ML  SOLN COMPARISON:  12/03/2018 FINDINGS: Lower chest: Coronary artery calcifications.  No acute abnormality. Hepatobiliary: No focal hepatic abnormality. Gallbladder unremarkable. Pancreas: No focal abnormality or ductal dilatation. Spleen: No focal abnormality.  Normal size. Adrenals/Urinary Tract: Mild left hydronephrosis. No hydronephrosis on the right. Adrenal glands unremarkable. There is a large abnormal filling defect filling much of the urinary bladder. This could reflect mass or blood clot. Foley catheter is present within the bladder. Stomach/Bowel: Normal appendix. Scattered colonic diverticulosis. No active diverticulitis. Stomach and small bowel decompressed, unremarkable. Vascular/Lymphatic: Aortic atherosclerosis. No enlarged abdominal or pelvic lymph nodes. Reproductive: Prostate enlargement. Other: No free fluid or free air. Musculoskeletal: No acute bony abnormality. IMPRESSION: Large irregular filling deflect within the urinary bladder which could reflect large bladder wall mass or blood  clot. There is mild left hydroureteronephrosis. This could be further evaluated with cystoscopy. Aortic atherosclerosis.  Coronary artery disease. Colonic diverticulosis. Electronically Signed   By: Rolm Baptise M.D.   On: 12/22/2018 21:47   Dg Chest Portable 1 View  Result Date: 12/22/2018 CLINICAL DATA:  Hypotension EXAM: PORTABLE CHEST 1 VIEW COMPARISON:  November 30, 2018 FINDINGS: The heart size and mediastinal contours are within normal limits. Both lungs are clear. The visualized skeletal structures are unremarkable. IMPRESSION: No active disease. Electronically Signed   By: Constance Holster M.D.   On: 12/22/2018 20:28   Dg Abd Portable 1v  Result Date: 12/24/2018 CLINICAL DATA:  68 year old male with abdominal distention. EXAM: PORTABLE ABDOMEN - 1 VIEW COMPARISON:  CT of the abdomen pelvis dated 12/22/2018. FINDINGS: Borderline dilated air-filled loops of small bowel in the upper abdomen may represent mild ileus. Clinical correlation is recommended. Stool noted throughout the colon. No free air identified. There is degenerative changes of the spine. Rounded density over the pelvis most consistent with blood clot full urinary bladder seen on the recent CT. IMPRESSION: 1. Borderline dilated air-filled loops of small bowel in the upper abdomen may represent mild ileus. 2. Density over the pelvis in keeping with blood filled urinary bladder. Electronically Signed   By: Anner Crete M.D.   On: 12/24/2018 23:27   Vas [place] Lower Extremity Venous (dvt)  Result Date: 01/07/2019  Lower Venous Study Indications: Pulmonary embolism.  Comparison Study: no prior Performing Technologist: June Leap RDMS, RVT  Examination Guidelines: A complete evaluation includes B-mode imaging, spectral Doppler, color Doppler, and power Doppler as needed of all accessible portions of each vessel. Bilateral testing is considered an integral part of a complete examination. Limited examinations for reoccurring indications may  be performed as noted.  +---------+---------------+---------+-----------+----------+-------------------+  RIGHT     Compressibility Phasicity Spontaneity Properties Thrombus Aging       +---------+---------------+---------+-----------+----------+-------------------+  CFV       Full            Yes       Yes                                         +---------+---------------+---------+-----------+----------+-------------------+  SFJ       Full                                                                  +---------+---------------+---------+-----------+----------+-------------------+  FV Prox   Full                                                                  +---------+---------------+---------+-----------+----------+-------------------+  FV Mid    Full                                                                  +---------+---------------+---------+-----------+----------+-------------------+  FV Distal Full                                                                  +---------+---------------+---------+-----------+----------+-------------------+  PFV       Full                                                                  +---------+---------------+---------+-----------+----------+-------------------+  POP       Full            Yes       Yes                                         +---------+---------------+---------+-----------+----------+-------------------+  PTV       None                                             isolated DVT                                                                     proximal segment     +---------+---------------+---------+-----------+----------+-------------------+  PERO      Full                                                                  +---------+---------------+---------+-----------+----------+-------------------+   +---------+---------------+---------+-----------+----------+------------------+  LEFT       Compressibility Phasicity Spontaneity Properties Thrombus Aging      +---------+---------------+---------+-----------+----------+------------------+  CFV       Full  Yes       Yes                                        +---------+---------------+---------+-----------+----------+------------------+  SFJ       Full                                                                 +---------+---------------+---------+-----------+----------+------------------+  FV Prox   Full                                                                 +---------+---------------+---------+-----------+----------+------------------+  FV Mid    Full                                                                 +---------+---------------+---------+-----------+----------+------------------+  FV Distal Full                                                                 +---------+---------------+---------+-----------+----------+------------------+  PFV       Full                                                                 +---------+---------------+---------+-----------+----------+------------------+  POP       Full            Yes       Yes                                        +---------+---------------+---------+-----------+----------+------------------+  PTV       Full                                                                 +---------+---------------+---------+-----------+----------+------------------+  PERO      None  isolated DVT mid                                                                segment             +---------+---------------+---------+-----------+----------+------------------+     Summary: Right: Findings consistent with acute deep vein thrombosis involving the right posterior tibial veins. A cystic structure is found in the popliteal fossa. Left: Findings consistent with acute deep vein thrombosis involving the left peroneal veins. No cystic  structure found in the popliteal fossa.  *See table(s) above for measurements and observations. Electronically signed by Ruta Hinds MD on 01/07/2019 at 11:05:32 AM.    Final     Lab Data:  CBC: Recent Labs  Lab 01/05/19 1730  01/08/19 0242 01/09/19 0211 01/10/19 0409 01/11/19 0448 01/12/19 0429  WBC 14.6*   < > 10.7* 11.6* 10.9* 8.9 10.0  NEUTROABS 11.2*  --   --   --   --   --   --   HGB 9.7*   < > 9.3* 9.7* 10.2* 10.4* 10.6*  HCT 32.6*   < > 30.2* 32.3* 33.6* 34.6* 34.4*  MCV 93.9   < > 92.1 92.0 91.3 91.8 90.5  PLT 354   < > 362 398 421* 426* 403*   < > = values in this interval not displayed.   Basic Metabolic Panel: Recent Labs  Lab 01/06/19 0157 01/07/19 0225 01/08/19 0242 01/09/19 0211 01/10/19 0409  NA 140 140 139 136 135  K 3.4* 3.9 4.3 4.2 4.4  CL 112* 111 110 105 104  CO2 21* 20* 20* 22 21*  GLUCOSE 95 97 110* 116* 105*  BUN 9 9 10 12 10   CREATININE 0.97 0.83 0.94 0.91 0.95  CALCIUM 8.1* 8.4* 8.5* 8.7* 9.1  MG 2.2  --  2.1  --   --   PHOS 4.1  --   --   --   --    GFR: Estimated Creatinine Clearance: 72 mL/min (by C-G formula based on SCr of 0.95 mg/dL). Liver Function Tests: Recent Labs  Lab 01/05/19 1730 01/06/19 0157  AST 18 16  ALT 21 18  ALKPHOS 56 52  BILITOT 0.4 0.5  PROT 7.0 6.3*  ALBUMIN 2.9* 2.8*   No results for input(s): LIPASE, AMYLASE in the last 168 hours. No results for input(s): AMMONIA in the last 168 hours. Coagulation Profile: Recent Labs  Lab 01/08/19 1241 01/09/19 0211 01/10/19 0409 01/11/19 0448 01/12/19 0429  INR 1.2 1.2 1.4* 1.8* 2.1*   Cardiac Enzymes: No results for input(s): CKTOTAL, CKMB, CKMBINDEX, TROPONINI in the last 168 hours. BNP (last 3 results) No results for input(s): PROBNP in the last 8760 hours. HbA1C: No results for input(s): HGBA1C in the last 72 hours. CBG: Recent Labs  Lab 01/11/19 2057  GLUCAP 134*   Lipid Profile: No results for input(s): CHOL, HDL, LDLCALC, TRIG, CHOLHDL,  LDLDIRECT in the last 72 hours. Thyroid Function Tests: No results for input(s): TSH, T4TOTAL, FREET4, T3FREE, THYROIDAB in the last 72 hours. Anemia Panel: No results for input(s): VITAMINB12, FOLATE, FERRITIN, TIBC, IRON, RETICCTPCT in the last 72 hours. Urine analysis:    Component Value Date/Time   COLORURINE YELLOW 01/05/2019 2047   APPEARANCEUR CLEAR 01/05/2019 2047   LABSPEC >  1.046 (H) 01/05/2019 2047   PHURINE 6.0 01/05/2019 2047   GLUCOSEU NEGATIVE 01/05/2019 2047   HGBUR SMALL (A) 01/05/2019 2047   BILIRUBINUR NEGATIVE 01/05/2019 2047   KETONESUR NEGATIVE 01/05/2019 2047   PROTEINUR 100 (A) 01/05/2019 2047   NITRITE NEGATIVE 01/05/2019 2047   LEUKOCYTESUR MODERATE (A) 01/05/2019 2047     Cherese Lozano M.D. [facility] 01/12/2019, 3:59 PM  Pager: [phone] Between 7am to 7pm - call Pager - [phone]  After 7pm go to [email or web address] - password [email or web address]  Call night coverage person covering after 7pm

## 2019-01-12 NOTE — Progress Notes (Signed)
PT Cancellation Note  Patient Details Name: Philip Jackson MRN: [id number] DOB: 12/22/1950   Cancelled Treatment:    Reason Eval/Treat Not Completed: Fatigue/lethargy limiting ability to participate - pt very fatigued, per RN pt did not sleep last night and has requested 2-3 hours of rest from staff. Will check back as schedule allows.  Julien Girt, PT Acute Rehabilitation Services Pager [phone]  Office [phone]    [place] 01/12/2019, 3:17 PM

## 2019-01-12 NOTE — Progress Notes (Signed)
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     OT comments  Pt. Seen for skilled OT session. Progressing well with ADLS. Able to complete toileting demo in b.room and simulated tub transfer. Moving well and demonstrates good safety awareness.    Follow Up Recommendations  Supervision/Assistance - 24 hour    Equipment Recommendations  3 in 1 bedside commode    Recommendations for Other Services      Precautions / Restrictions Precautions Precautions: Fall Restrictions Weight Bearing Restrictions: No RLE Weight Bearing: Weight bearing as tolerated       Mobility Bed Mobility Overal bed mobility: Modified Independent                Transfers Overall transfer level: Needs assistance Equipment used: Rolling walker (2 wheeled) Transfers: Sit to/from [facility] Sit to Stand: Supervision Stand pivot transfers: Supervision            Balance                                           ADL either performed or assessed with clinical judgement   ADL Overall ADL's : Needs assistance/impaired                         Toilet Transfer: Supervision/safety;Ambulation;RW;BSC;Regular Glass blower/designer Details (indicate cue type and reason): 3n1 over the commode (reports that is the same set up he uses at home) [place] and Hygiene: Supervision/safety;Sit to/from stand Toileting - Clothing Manipulation Details (indicate cue type and reason): simulated in b.room during transfer, pt. did not have to use the b.room and also currently has catheter in place Tub/ Shower Transfer: Tub transfer;Min guard;Ambulation;Rolling walker;Cueing for sequencing;Cueing for safety Tub/Shower Transfer Details (indicate cue type  and reason): pt. able to return demo of side step ove tub with L faucet. educated on wife to support opposite side of rw during transfer in/out of tub. he verbalized understanding and demonstrates safe technique Functional mobility during ADLs: Supervision/safety;Cueing for sequencing       Vision       Perception     Praxis      Cognition Arousal/Alertness: Awake/alert Behavior During Therapy: WFL for tasks assessed/performed Overall Cognitive Status: Within Functional Limits for tasks assessed                                          Exercises     Shoulder Instructions       General Comments      Pertinent Vitals/ Pain       Pain Assessment: No/denies pain  Home Living                                          Prior Functioning/Environment              Frequency  Min 2X/week        Progress Toward Goals  OT Goals(current goals can now  be found in the care plan section)  Progress towards OT goals: Progressing toward goals     Plan Discharge plan remains appropriate    Co-evaluation                 AM-PAC OT "6 Clicks" Daily Activity     Outcome Measure   Help from another person eating meals?: None Help from another person taking care of personal grooming?: None Help from another person toileting, which includes using toliet, bedpan, or urinal?: A Little Help from another person bathing (including washing, rinsing, drying)?: A Little Help from another person to put on and taking off regular upper body clothing?: None Help from another person to put on and taking off regular lower body clothing?: A Little 6 Click Score: 21    End of Session Equipment Utilized During Treatment: Rolling walker;Gait belt  OT Visit Diagnosis: Unsteadiness on feet (R26.81);Other abnormalities of gait and mobility (R26.89);Muscle weakness (generalized) (M62.81)   Activity Tolerance Patient tolerated treatment well   Patient  Left in chair;with call bell/phone within reach   Nurse Communication          Time: [id number] OT Time Calculation (min): 20 min  Charges: OT General Charges $OT Visit: 1 Visit OT Treatments $Self Care/Home Management : 8-22 mins   Janice Coffin, COTA/L 01/12/2019, 10:27 AM

## 2019-01-12 NOTE — Progress Notes (Signed)
[place] for IV UFH, Warfarin Indication: PE/DVT  Allergies  Allergen Reactions  . 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 Nsaids     Duodenal ulcer.  anticoagulated   Patient Measurements: Height: 5\' 8"  (172.7 cm) Weight: 179 lb 0.2 oz (81.2 kg) IBW/kg (Calculated) : 68.4 Heparin Dosing Weight: actual body weight   Vital Signs: Temp: 98 F (36.7 C) (09/25 0522) Temp Source: Oral (09/25 0522) BP: 105/67 (09/25 0522) Pulse Rate: 70 (09/25 0909)  Labs: Recent Labs    01/10/19 0409 01/10/19 1949 01/11/19 0448 01/12/19 0429  HGB 10.2*  --  10.4* 10.6*  HCT 33.6*  --  34.6* 34.4*  PLT 421*  --  426* 403*  LABPROT 16.5*  --  20.8* 23.0*  INR 1.4*  --  1.8* 2.1*  HEPARINUNFRC 0.37 0.54 0.70 0.69  CREATININE 0.95  --   --   --    Estimated Creatinine Clearance: 72 mL/min (by C-G formula based on SCr of 0.95 mg/dL).  Assessment:  46 yoM with multiple PEs per CTa on 01/05/2019. LE dopplers also consistent with bilateral DVT. Pharmacy consulted for IV heparin dosing. Patient was previously on apixaban for hx of a-fib but was held due to hospital admission for acute blood loss anemia, hemorrhagic shock secondary to hematuria. Baseline labs: aPTT 35 sec, INR 1.2, Hgb 9.7.   Today, 01/12/2019:  Day 8 of Heparin, Day 5 of Warfarin   Heparin level = 0.69 units/mL, remains on upper end of goal range  INR 2.1, up to goal range  Hgb low/improved to 10.6, Pltc elevated   No bleeding or infusion issues noted per nursing  Major DDIs with Warfarin: Septra DS (through 9/26), Amiodarone  Goal of Therapy:  Heparin level 0.3-0.7 units/ml  INR 2-3 Monitor platelets by anticoagulation protocol: Yes   Plan:   Decrease heparin infusion slightly to 1300 units/hr  Warfarin 2.5mg  PO x 1 today   Daily CBC, INR, and heparin level  Requires minimum 5 day overlap of heparin/warfarin and INR =/> 2 x 2 days prior to discontinuing bridge  therapy  Monitor closely for any further s/sx of bleeding   Lindell Spar, PharmD, BCPS Clinical Pharmacist  01/12/2019, 5:07 PM

## 2019-01-13 LAB — PROTIME-INR
INR: 2.2 — ABNORMAL HIGH (ref 0.8–1.2)
Prothrombin Time: 23.9 seconds — ABNORMAL HIGH (ref 11.4–15.2)

## 2019-01-13 LAB — BASIC METABOLIC PANEL
Anion gap: 8 (ref 5–15)
BUN: 13 mg/dL (ref 8–23)
CO2: 23 mmol/L (ref 22–32)
Calcium: 9 mg/dL (ref 8.9–10.3)
Chloride: 103 mmol/L (ref 98–111)
Creatinine, Ser: 1.06 mg/dL (ref 0.61–1.24)
GFR calc Af Amer: >60 mL/min (ref >60)
GFR calc non Af Amer: >60 mL/min (ref >60)
Glucose, Bld: 116 mg/dL — ABNORMAL HIGH (ref 70–99)
Potassium: 5.1 mmol/L (ref 3.5–5.1)
Sodium: 134 mmol/L — ABNORMAL LOW (ref 135–145)

## 2019-01-13 LAB — CBC
HCT: 36.2 % — ABNORMAL LOW (ref 39.0–52.0)
Hemoglobin: 11.1 g/dL — ABNORMAL LOW (ref 13.0–17.0)
MCH: 27.7 pg (ref 26.0–34.0)
MCHC: 30.7 g/dL (ref 30.0–36.0)
MCV: 90.3 fL (ref 80.0–100.0)
Platelets: 425 10*3/uL — ABNORMAL HIGH (ref 150–400)
RBC: 4.01 MIL/uL — ABNORMAL LOW (ref 4.22–5.81)
RDW: 15 % (ref 11.5–15.5)
WBC: 9.7 10*3/uL (ref 4.0–10.5)
nRBC: 0 % (ref 0.0–0.2)

## 2019-01-13 LAB — HEMOGLOBIN AND HEMATOCRIT, BLOOD
HCT: 38.2 % — ABNORMAL LOW (ref 39.0–52.0)
Hemoglobin: 11.4 g/dL — ABNORMAL LOW (ref 13.0–17.0)

## 2019-01-13 LAB — HEPARIN LEVEL (UNFRACTIONATED): Heparin Unfractionated: 0.55 IU/mL (ref 0.30–0.70)

## 2019-01-13 MED ORDER — WARFARIN SODIUM 2.5 MG PO TABS
2.5000 mg | ORAL_TABLET | Freq: Once | ORAL | Status: AC
  Administered 2019-01-13: 2.5 mg via ORAL
  Filled 2019-01-13: qty 1

## 2019-01-13 MED ORDER — SODIUM CHLORIDE 0.9 % IV SOLN
INTRAVENOUS | Status: DC
  Administered 2019-01-13 (×2): via INTRAVENOUS

## 2019-01-13 MED ORDER — COUMADIN BOOK
Freq: Once | Status: AC
  Administered 2019-01-13: 18:00:00
  Filled 2019-01-13: qty 1

## 2019-01-13 NOTE — Progress Notes (Signed)
Dr Alinda Money in to assess and irrigate f/c. Some small old clots out. Urine color improving. Will monitor.Dr Alinda Money discussed plan w/ pt and wife.

## 2019-01-13 NOTE — Progress Notes (Signed)
[place] for Warfarin Indication: PE/DVT  Allergies  Allergen Reactions  . 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 Nsaids     Duodenal ulcer.  anticoagulated   Patient Measurements: Height: 5\' 8"  (172.7 cm) Weight: 179 lb 0.2 oz (81.2 kg) IBW/kg (Calculated) : 68.4 Heparin Dosing Weight: actual body weight   Vital Signs: Temp: 98.6 F (37 C) (09/26 1345) Temp Source: Oral (09/26 1345) BP: 115/70 (09/26 1345) Pulse Rate: 71 (09/26 1345)  Labs: Recent Labs    01/11/19 0448 01/12/19 0429 01/13/19 0443 01/13/19 1444  HGB 10.4* 10.6* 11.1* 11.4*  HCT 34.6* 34.4* 36.2* 38.2*  PLT 426* 403* 425*  --   LABPROT 20.8* 23.0* 23.9*  --   INR 1.8* 2.1* 2.2*  --   HEPARINUNFRC 0.70 0.69 0.55  --   CREATININE  --   --   --  1.06   Estimated Creatinine Clearance: 64.5 mL/min (by C-G formula based on SCr of 1.06 mg/dL).  Assessment:  35 yoM with multiple PEs per CTa on 01/05/2019. LE dopplers also consistent with bilateral DVT. Pharmacy consulted for IV heparin dosing. Patient was previously on apixaban for hx of a-fib but was held due to hospital admission for acute blood loss anemia, hemorrhagic shock secondary to hematuria. Baseline labs: aPTT 35 sec, INR 1.2, Hgb 9.7.   Today, 01/13/2019:  Day 8 of Heparin, Day 5 of Warfarin   Patient with hematuria this morning. Urology consulted. MD irrigated catheter with a few small old clots removed. TRH discontinued IV heparin but said okay to continue with warfarin.   Per discussion with RN, urine color significantly improved throughout the day, almost clear at this time.   Heparin level this AM therapeutic  INR 2.2, now within therapeutic range   Hgb low/improved to 11.4, Pltc elevated   Major DDIs with Warfarin: Septra DS (through 9/26), Amiodarone  Goal of Therapy:  Heparin level 0.3-0.7 units/ml  INR 2-3 Monitor platelets by anticoagulation protocol: Yes   Plan:   Heparin  infusion discontinued per MD  Warfarin 2.5mg  PO x 1 today   Daily CBC, INR for now  Monitor closely for any further s/sx of bleeding  Likely will require larger dose of warfarin once Septra DS course completed this PM  Lindell Spar, PharmD, BCPS Clinical Pharmacist  01/13/2019, 5:36 PM

## 2019-01-13 NOTE — Progress Notes (Signed)
[facility]                                                                              Patient Demographics  Philip Jackson, is a 68 y.o. male, DOB - [birth year], BN:[id number]  Admit date - 01/05/2019   Admitting Physician Toy Baker, MD  Outpatient Primary MD for the patient is Hamrick, Lorin Mercy, MD  Outpatient specialists:   LOS - 8  days   Medical records reviewed and are as summarized below:    Chief Complaint  Patient presents with   pulmonary emboli       Brief summary   HPI per Dr. Luan Moore Stewartis a 68 y.o.malewith medical history significant of PSVT, HTN, OSAon CPAP hx of pA.fib on Eliquis in the past, GERD, Knee replacement on August 3rd Presented withworsening shortness of breath patient was seen by primary care provider had a CAT scan done which showed evidence of PE he was sent to emergency department  Prior to that has been admitted forsepsis from intra-abdominal cause at that time he had possible contained duodenal perforation. Also had acute kidney injury with hematuria and urine retention. At that time urology saw the patient and he was discharged home. Of note patient had recent UTI diagnosis an outpatient was pansensitive he completed 5 days of Keflex HE returned with hypotension due to hematuria and neededemergent blood transfusion as there was 5 g drop in hemoglobin with frank hematuria. CT scan showed bladder mass versus clot. Urology was consulted and continuous bladder irrigation started. Hospital course complicated by atrial fibrillation with RVR therefore cardiology consulted. Cystoscopy, completed on 9/11/2020with clot evacuation   He was discharged home on 9/14 at the time of discharge Hg 9.0Continue Foley catheter, and finasteride. -------He was no longer on Eliquis  Since then he had worsening shortness of breath family states he was shortness of breath even prior to discharge, No leg swelling, no chest  pain  He still got a foley catheter   Assessment & Plan    Principal Problem:   Bilateral pulmonary embolism ([place]), acute bilateral lower extremity DVT -Patient was started on IV heparin and Coumadin, goal INR 2-3 -2D echo with EF of 6065%, mildly increased LVH left ventricular diastolic Doppler parameters consistent with impaired relaxation pattern of LV diastolic filling, global right ventricle has moderately reduced systolic function, right ventricular size severely enlarged. -Lower extremity Dopplers consistent with bilateral DVT. -INR therapeutic today however patient having dark hematuria since last night.  Heparin drip discontinued this morning.  Urology was consulted, seen by Dr. Alinda Money.  Catheter was irrigated and few small old clots were removed.  -Per Dr. Alinda Money, hematuria is now improving, recommended to restart Coumadin tonight.  If no further bleeding tomorrow, H&H remained stable, can DC home tomorrow.  Patient has appointment on Monday with his cardiologist and with Dr. Tresa Moore urology on Tuesday. -Repeat H&H remained stable.  Hemoglobin 11.4   Active Problems: Hypertension BP stable, continue Toprol-XL  OSA Stable, continue BiPAP nightly  Hyperlipidemia Continue statin  Paroxysmal atrial fibrillation Rate currently controlled, continue half dose of Toprol-XL, amiodarone.  No further bradycardia.  Continue Coumadin, heparin  drip discontinued. Outpatient follow-up with cardiology to discuss chronic anticoagulation options  Postop urinary retention with hematuria - Foley catheter in place.  Urology following. -Patient will finish Bactrim dose today. -Appreciate urology recommendations, seen by Dr. Alinda Money today.  Patient has urology appointment with Dr. Tresa Moore on Tuesday 9/29.   Chronic diastolic CHF Currently stable, euvolemic  Code Status: Full code DVT Prophylaxis: Heparin drip discontinued, Coumadin Family Communication: Discussed all imaging results, lab  results, explained to the patient   Disposition Plan: Possible DC home tomorrow if no further hematuria  Time Spent in minutes 35 minutes  Procedures:  2D echo  Consultants:   Urology  Antimicrobials:   Anti-infectives (From admission, onward)   Start     Dose/Rate Route Frequency Ordered Stop   01/08/19 2200  sulfamethoxazole-trimethoprim (BACTRIM DS) 800-160 MG per tablet 1 tablet     1 tablet Oral 2 times daily 01/08/19 1012 01/14/19 0959   01/05/19 2200  sulfamethoxazole-trimethoprim (BACTRIM DS) 800-160 MG per tablet 1 tablet  Status:  Discontinued     1 tablet Oral 2 times daily 01/05/19 2152 01/08/19 1012         Medications  Scheduled Meds:  amiodarone  200 mg Oral Daily   Chlorhexidine Gluconate Cloth  6 each Topical Daily   ferrous sulfate  325 mg Oral Once per day on Tue Thu   finasteride  5 mg Oral Daily   metoprolol succinate  50 mg Oral Daily   pantoprazole  40 mg Oral Daily   rosuvastatin  20 mg Oral QHS   senna-docusate  1 tablet Oral BID   sulfamethoxazole-trimethoprim  1 tablet Oral BID   Warfarin - Pharmacist Dosing Inpatient   Does not apply q1800   zolpidem  5 mg Oral QHS   Continuous Infusions:  sodium chloride 75 mL/hr at 01/13/19 0946   PRN Meds:.acetaminophen **OR** acetaminophen, clobetasol cream, HYDROcodone-acetaminophen, methocarbamol, ondansetron **OR** ondansetron (ZOFRAN) IV, oxyCODONE, polyvinyl alcohol      Subjective:   Philip Jackson was seen and examined today.  At the time of my examination, patient having hematuria, heparin drip discontinued.  No chest pain or shortness of breath.  No dizziness or lightheadedness.  No bradycardia overnight.  Patient denies dizziness,  abdominal pain, N/V/D/C, new weakness, numbess, tingling.   Objective:   Vitals:   01/12/19 1500 01/12/19 2041 01/13/19 0439 01/13/19 1345  BP: 120/80 113/74 108/61 115/70  Pulse: 68 64 69 71  Resp: 16 16 18 18   Temp: 98 F (36.7 C) 98.3 F  (36.8 C) 98.1 F (36.7 C) 98.6 F (37 C)  TempSrc: Oral Oral Oral Oral  SpO2: 96% 96% 95% 96%  Weight:      Height:        Intake/Output Summary (Last 24 hours) at 01/13/2019 1927 Last data filed at 01/13/2019 1735 Gross per 24 hour  Intake 1918.77 ml  Output 2100 ml  Net -181.23 ml     Wt Readings from Last 3 Encounters:  01/05/19 81.2 kg  12/22/18 85.7 kg  12/19/18 86.1 kg   Physical Exam  General: Alert and oriented x 3, NAD  Eyes:   HEENT:  Atraumatic, normocephalic  Cardiovascular: S1 S2 clear, RRR no pedal edema b/l  Respiratory: CTAB, no wheezing, rales or rhonchi  Gastrointestinal: Soft, nontender, nondistended, NBS  Ext: no pedal edema bilaterally  Neuro: no new deficits  Musculoskeletal: No cyanosis, clubbing  Skin: No rashes  Psych: Normal affect and demeanor, alert and oriented x3  GU: Hematuria in the Foley   Data Reviewed:  I have personally reviewed following labs and imaging studies  Micro Results Recent Results (from the past 240 hour(s))  SARS CORONAVIRUS 2 (TAT 6-24 HRS) Nasopharyngeal Nasopharyngeal Swab     Status: None   Collection Time: 01/05/19  6:20 PM   Specimen: Nasopharyngeal Swab  Result Value Ref Range Status   SARS Coronavirus 2 NEGATIVE NEGATIVE Final    Comment: (NOTE) SARS-CoV-2 target nucleic acids are NOT DETECTED. The SARS-CoV-2 RNA is generally detectable in upper and lower respiratory specimens during the acute phase of infection. Negative results do not preclude SARS-CoV-2 infection, do not rule out co-infections with other pathogens, and should not be used as the sole basis for treatment or other patient management decisions. Negative results must be combined with clinical observations, patient history, and epidemiological information. The expected result is Negative. Fact Sheet for Patients: SugarRoll.be Fact Sheet for Healthcare  Providers: https://www.woods-mathews.com/ This test is not yet approved or cleared by the [place] FDA and  has been authorized for detection and/or diagnosis of SARS-CoV-2 by FDA under an Emergency Use Authorization (EUA). This EUA will remain  in effect (meaning this test can be used) for the duration of the COVID-19 declaration under Section 56 4(b)(1) of the Act, 21 U.S.C. section 360bbb-3(b)(1), unless the authorization is terminated or revoked sooner. Performed at [facility], [place] [address] [place], [place] [address]   Culture, Urine     Status: None   Collection Time: 01/06/19  7:40 AM   Specimen: Urine, Clean Catch  Result Value Ref Range Status   Specimen Description   Final    URINE, CLEAN CATCH Performed at [facility], [place] [address], [place], [place] [address]    Special Requests   Final    NONE Performed at [facility], [place] [address], [place], [place] [address]    Culture   Final    NO GROWTH Performed at [facility], [place] [address], [place], [place] [address]    Report Status 01/07/2019 FINAL  Final    Radiology Reports Ct Angio Chest Pe W Or Wo Contrast  Result Date: 01/05/2019 CLINICAL DATA:  Shortness of breath. Dyspnea on exertion. EXAM: CT ANGIOGRAPHY CHEST WITH CONTRAST TECHNIQUE: Multidetector CT imaging of the chest was performed using the standard protocol during bolus administration of intravenous contrast. Multiplanar CT image reconstructions and MIPs were obtained to evaluate the vascular anatomy. CONTRAST:  47mL ISOVUE-370 IOPAMIDOL (ISOVUE-370) INJECTION 76% COMPARISON:  Chest x-ray dated 12/22/2018 FINDINGS: Cardiovascular: There are multiple bilateral pulmonary emboli to all 3 lobes on the right and both lobes on the left. RV LV ratio is normal. The heart size is normal. No pericardial effusion. Mediastinum/Nodes: No enlarged mediastinal, hilar, or axillary lymph nodes. Thyroid  gland, trachea, and esophagus demonstrate no significant findings. Lungs/Pleura: Tiny bilateral pleural effusions. Minimal atelectasis at the lung bases posteriorly. Lungs are otherwise clear. Upper Abdomen: Normal. Musculoskeletal: No chest wall abnormality. No acute or significant osseous findings. Review of the MIP images confirms the above findings. IMPRESSION: 1. Multiple bilateral pulmonary emboli. 2. Tiny bilateral pleural effusions. Critical Value/emergent results were called by telephone at the time of interpretation on 01/05/2019 at 4:25 pm to providerKATHLEEN ZELLER , who verbally acknowledged these results. The patient was advised to go straight to the [facility] emergency room for treatment. Electronically Signed   By: Lorriane Shire M.D.   On: 01/05/2019 16:29  Ct Abdomen Pelvis W Contrast  Result Date: 12/22/2018 CLINICAL DATA:  Abdominal distention EXAM: CT ABDOMEN AND PELVIS WITH CONTRAST TECHNIQUE: Multidetector CT imaging of the abdomen and pelvis was performed using the standard protocol following bolus administration of intravenous contrast. CONTRAST:  143mL OMNIPAQUE IOHEXOL 300 MG/ML  SOLN COMPARISON:  12/03/2018 FINDINGS: Lower chest: Coronary artery calcifications.  No acute abnormality. Hepatobiliary: No focal hepatic abnormality. Gallbladder unremarkable. Pancreas: No focal abnormality or ductal dilatation. Spleen: No focal abnormality.  Normal size. Adrenals/Urinary Tract: Mild left hydronephrosis. No hydronephrosis on the right. Adrenal glands unremarkable. There is a large abnormal filling defect filling much of the urinary bladder. This could reflect mass or blood clot. Foley catheter is present within the bladder. Stomach/Bowel: Normal appendix. Scattered colonic diverticulosis. No active diverticulitis. Stomach and small bowel decompressed, unremarkable. Vascular/Lymphatic: Aortic atherosclerosis. No enlarged abdominal or pelvic lymph nodes. Reproductive: Prostate enlargement.  Other: No free fluid or free air. Musculoskeletal: No acute bony abnormality. IMPRESSION: Large irregular filling deflect within the urinary bladder which could reflect large bladder wall mass or blood clot. There is mild left hydroureteronephrosis. This could be further evaluated with cystoscopy. Aortic atherosclerosis.  Coronary artery disease. Colonic diverticulosis. Electronically Signed   By: Rolm Baptise M.D.   On: 12/22/2018 21:47   Dg Chest Portable 1 View  Result Date: 12/22/2018 CLINICAL DATA:  Hypotension EXAM: PORTABLE CHEST 1 VIEW COMPARISON:  November 30, 2018 FINDINGS: The heart size and mediastinal contours are within normal limits. Both lungs are clear. The visualized skeletal structures are unremarkable. IMPRESSION: No active disease. Electronically Signed   By: Constance Holster M.D.   On: 12/22/2018 20:28   Dg Abd Portable 1v  Result Date: 12/24/2018 CLINICAL DATA:  68 year old male with abdominal distention. EXAM: PORTABLE ABDOMEN - 1 VIEW COMPARISON:  CT of the abdomen pelvis dated 12/22/2018. FINDINGS: Borderline dilated air-filled loops of small bowel in the upper abdomen may represent mild ileus. Clinical correlation is recommended. Stool noted throughout the colon. No free air identified. There is degenerative changes of the spine. Rounded density over the pelvis most consistent with blood clot full urinary bladder seen on the recent CT. IMPRESSION: 1. Borderline dilated air-filled loops of small bowel in the upper abdomen may represent mild ileus. 2. Density over the pelvis in keeping with blood filled urinary bladder. Electronically Signed   By: Anner Crete M.D.   On: 12/24/2018 23:27   Vas [place] Lower Extremity Venous (dvt)  Result Date: 01/07/2019  Lower Venous Study Indications: Pulmonary embolism.  Comparison Study: no prior Performing Technologist: June Leap RDMS, RVT  Examination Guidelines: A complete evaluation includes B-mode imaging, spectral Doppler, color Doppler,  and power Doppler as needed of all accessible portions of each vessel. Bilateral testing is considered an integral part of a complete examination. Limited examinations for reoccurring indications may be performed as noted.  +---------+---------------+---------+-----------+----------+-------------------+  RIGHT     Compressibility Phasicity Spontaneity Properties Thrombus Aging       +---------+---------------+---------+-----------+----------+-------------------+  CFV       Full            Yes       Yes                                         +---------+---------------+---------+-----------+----------+-------------------+  SFJ       Full                                                                  +---------+---------------+---------+-----------+----------+-------------------+  FV Prox   Full                                                                  +---------+---------------+---------+-----------+----------+-------------------+  FV Mid    Full                                                                  +---------+---------------+---------+-----------+----------+-------------------+  FV Distal Full                                                                  +---------+---------------+---------+-----------+----------+-------------------+  PFV       Full                                                                  +---------+---------------+---------+-----------+----------+-------------------+  POP       Full            Yes       Yes                                         +---------+---------------+---------+-----------+----------+-------------------+  PTV       None                                             isolated DVT                                                                     proximal segment     +---------+---------------+---------+-----------+----------+-------------------+  PERO      Full                                                                   +---------+---------------+---------+-----------+----------+-------------------+   +---------+---------------+---------+-----------+----------+------------------+  LEFT      Compressibility Phasicity Spontaneity Properties Thrombus Aging      +---------+---------------+---------+-----------+----------+------------------+  CFV       Full  Yes       Yes                                        +---------+---------------+---------+-----------+----------+------------------+  SFJ       Full                                                                 +---------+---------------+---------+-----------+----------+------------------+  FV Prox   Full                                                                 +---------+---------------+---------+-----------+----------+------------------+  FV Mid    Full                                                                 +---------+---------------+---------+-----------+----------+------------------+  FV Distal Full                                                                 +---------+---------------+---------+-----------+----------+------------------+  PFV       Full                                                                 +---------+---------------+---------+-----------+----------+------------------+  POP       Full            Yes       Yes                                        +---------+---------------+---------+-----------+----------+------------------+  PTV       Full                                                                 +---------+---------------+---------+-----------+----------+------------------+  PERO      None  isolated DVT mid                                                                segment             +---------+---------------+---------+-----------+----------+------------------+     Summary: Right: Findings consistent with acute deep vein thrombosis involving the right posterior  tibial veins. A cystic structure is found in the popliteal fossa. Left: Findings consistent with acute deep vein thrombosis involving the left peroneal veins. No cystic structure found in the popliteal fossa.  *See table(s) above for measurements and observations. Electronically signed by Ruta Hinds MD on 01/07/2019 at 11:05:32 AM.    Final     Lab Data:  CBC: Recent Labs  Lab 01/09/19 0211 01/10/19 0409 01/11/19 0448 01/12/19 0429 01/13/19 0443 01/13/19 1444  WBC 11.6* 10.9* 8.9 10.0 9.7  --   HGB 9.7* 10.2* 10.4* 10.6* 11.1* 11.4*  HCT 32.3* 33.6* 34.6* 34.4* 36.2* 38.2*  MCV 92.0 91.3 91.8 90.5 90.3  --   PLT 398 421* 426* 403* 425*  --    Basic Metabolic Panel: Recent Labs  Lab 01/07/19 0225 01/08/19 0242 01/09/19 0211 01/10/19 0409 01/13/19 1444  NA 140 139 136 135 134*  K 3.9 4.3 4.2 4.4 5.1  CL 111 110 105 104 103  CO2 20* 20* 22 21* 23  GLUCOSE 97 110* 116* 105* 116*  BUN 9 10 12 10 13   CREATININE 0.83 0.94 0.91 0.95 1.06  CALCIUM 8.4* 8.5* 8.7* 9.1 9.0  MG  --  2.1  --   --   --    GFR: Estimated Creatinine Clearance: 64.5 mL/min (by C-G formula based on SCr of 1.06 mg/dL). Liver Function Tests: No results for input(s): AST, ALT, ALKPHOS, BILITOT, PROT, ALBUMIN in the last 168 hours. No results for input(s): LIPASE, AMYLASE in the last 168 hours. No results for input(s): AMMONIA in the last 168 hours. Coagulation Profile: Recent Labs  Lab 01/09/19 0211 01/10/19 0409 01/11/19 0448 01/12/19 0429 01/13/19 0443  INR 1.2 1.4* 1.8* 2.1* 2.2*   Cardiac Enzymes: No results for input(s): CKTOTAL, CKMB, CKMBINDEX, TROPONINI in the last 168 hours. BNP (last 3 results) No results for input(s): PROBNP in the last 8760 hours. HbA1C: No results for input(s): HGBA1C in the last 72 hours. CBG: Recent Labs  Lab 01/11/19 2057  GLUCAP 134*   Lipid Profile: No results for input(s): CHOL, HDL, LDLCALC, TRIG, CHOLHDL, LDLDIRECT in the last 72 hours. Thyroid  Function Tests: No results for input(s): TSH, T4TOTAL, FREET4, T3FREE, THYROIDAB in the last 72 hours. Anemia Panel: No results for input(s): VITAMINB12, FOLATE, FERRITIN, TIBC, IRON, RETICCTPCT in the last 72 hours. Urine analysis:    Component Value Date/Time   COLORURINE YELLOW 01/05/2019 2047   APPEARANCEUR CLEAR 01/05/2019 2047   LABSPEC >1.046 (H) 01/05/2019 2047   PHURINE 6.0 01/05/2019 2047   GLUCOSEU NEGATIVE 01/05/2019 2047   HGBUR SMALL (A) 01/05/2019 2047   BILIRUBINUR NEGATIVE 01/05/2019 2047   KETONESUR NEGATIVE 01/05/2019 2047   PROTEINUR 100 (A) 01/05/2019 2047   NITRITE NEGATIVE 01/05/2019 2047   LEUKOCYTESUR MODERATE (A) 01/05/2019 2047     Makya Yurko M.D. [facility] 01/13/2019, 7:27 PM  Pager: [phone] Between 7am to 7pm - call Pager - [phone]  After 7pm go to [email or web address] - password [email or web address]  Call night coverage person covering after 7pm
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
## 2019-01-14 LAB — PROTIME-INR
INR: 2.2 — ABNORMAL HIGH (ref 0.8–1.2)
Prothrombin Time: 23.9 s — ABNORMAL HIGH (ref 11.4–15.2)

## 2019-01-14 LAB — CBC
HCT: 36.5 % — ABNORMAL LOW (ref 39.0–52.0)
Hemoglobin: 10.9 g/dL — ABNORMAL LOW (ref 13.0–17.0)
MCH: 27.4 pg (ref 26.0–34.0)
MCHC: 29.9 g/dL — ABNORMAL LOW (ref 30.0–36.0)
MCV: 91.7 fL (ref 80.0–100.0)
Platelets: 394 10*3/uL (ref 150–400)
RBC: 3.98 MIL/uL — ABNORMAL LOW (ref 4.22–5.81)
RDW: 14.8 % (ref 11.5–15.5)
WBC: 9.5 10*3/uL (ref 4.0–10.5)
nRBC: 0 % (ref 0.0–0.2)

## 2019-01-14 MED ORDER — WARFARIN SODIUM 2.5 MG PO TABS: 5.0000 mg | ORAL_TABLET | Freq: Every day | ORAL | 0 refills | Status: DC

## 2019-01-14 MED ORDER — METOPROLOL SUCCINATE ER 50 MG PO TB24: 50.0000 mg | ORAL_TABLET | Freq: Every day | ORAL | 0 refills | Status: DC

## 2019-01-14 NOTE — Discharge Summary (Signed)
Physician Discharge Summary  WM RIVIELLO [id number] DOB: 08/27/1950 DOA: 01/05/2019  PCP: Leonides Sake, MD  Admit date: 01/05/2019 Discharge date: 01/14/2019  Admitted From: Home Disposition: Home  Recommendations for Outpatient Follow-up:  1. Follow up with PCP in 1 week 2. Please obtain BMP/CBC in one week 3. Follow-up at Coumadin clinic at cardiologist's office; consider switching back to Eliquis depending on discussions with patient and specialists 4. Please follow up on the following pending results: None  Home Health: None Equipment/Devices: None  Discharge Condition: Stable CODE STATUS: Full code Diet recommendation: Heart healthy   Brief/Interim Summary:  Admission HPI written by Toy Baker, MD   HPI: Philip Jackson is a 68 y.o. male with medical history significant of PSVT, HTN, OSA on CPAP hx of pA.fib on Eliquis in the past,  GERD, Knee replacement on August 3rd    Presented with   worsening shortness of breath patient was seen by primary care provider had a CAT scan done which showed evidence of PE he was sent to emergency department  Prior to that has been admitted for sepsis from intra-abdominal cause at that time he had possible contained duodenal perforation. Also had acute kidney injury with hematuria and urine retention. At that time urology saw the patient and he was discharged home. Of note patient had recent UTI diagnosis an outpatient was pansensitive he completed 5 days of Keflex HE returned with hypotension due to hematuria and needed emergent blood transfusion as there was 5 g drop in hemoglobin with frank hematuria. CT scan showed bladder mass versus clot. Urology was consulted and continuous bladder irrigation started. Hospital course complicated by atrial fibrillation with RVR therefore cardiology consulted. Cystoscopy, completed on 12/29/2018 with clot evacuation   He was discharged home on 9/14 at the time of discharge Hg 9.0  Continue Foley catheter, and finasteride. He was no longer on Eliquis  Since then he had worsening shortness of breath family states he was shortness of breath even prior to discharge No leg swelling, no chest pain  No fever  Urology have seen him as an outpatient he was able to urinate on his own  He still got a foley catheter  His last coloscopy was 68 yo and was normal  He quit smoking in 10 years    Hospital course:   Bilateral pulmonary embolism ([place]), acute bilateral lower extremity DVT -Patient was started on IV heparin and Coumadin, goal INR 2-3 -2D echo with EF of 6065%, mildly increased LVH left ventricular diastolic Doppler parameters consistent with impaired relaxation pattern of LV diastolic filling, global right ventricle has moderately reduced systolic function, right ventricular size severely enlarged. -Lower extremity Dopplers consistent with bilateral DVT. -Anticoagulation held momentarily secondary to significant hematuria. No continued bleeding prior to discharge and hemoglobin stable. -Discussed restarting Eliquis with patient but opted to continue Coumadin secondary to specialist discussions/recommendations around hematuria per patient.   Active Problems: Hypertension BP stable, continue Toprol-XL. Dose decreased to 50 mg daily secondary to bradycardia.  OSA Stable, continue BiPAP nightly  Hyperlipidemia Continue statin  Paroxysmal atrial fibrillation Rate currently controlled, continue half dose of Toprol-XL, amiodarone.  No further bradycardia. Started on Coumadin for anticoagulation as mentioned above (previously on Eliquis on prior hospitalization).  Postop urinary retention with hematuria - Foley catheter in place.  Urology consulted and irrigated bladder. -Patient completed Bactrim - Patient has urology appointment with Dr. Tresa Moore on Tuesday 9/29.   Chronic diastolic CHF Currently stable, euvolemic  Discharge Diagnoses:  Principal  Problem:   Bilateral pulmonary embolism (HCC) Active Problems:   HTN (hypertension)   HLD (hyperlipidemia)   OSA treated with BiPAP   Postoperative urinary retention   PAF (paroxysmal atrial fibrillation) (HCC)   Pulmonary embolism (HCC)   Hypokalemia   DVT of lower extremity, bilateral [facility])    Discharge Instructions  Discharge Instructions    Call MD for:  persistant nausea and vomiting   Complete by: As directed    Call MD for:  severe uncontrolled pain   Complete by: As directed    Diet - low sodium heart healthy   Complete by: As directed    Increase activity slowly   Complete by: As directed      Allergies as of 01/14/2019      Reactions   Bee Pollen Anaphylaxis   Allergic to bees   Nsaids    Duodenal ulcer.  anticoagulated      Medication List    STOP taking these medications   Bactrim DS 800-160 MG tablet Generic drug: sulfamethoxazole-trimethoprim     TAKE these medications   acetaminophen 500 MG tablet Commonly known as: TYLENOL Take two (2) tablets by mouth every 4 to 6 hours as needed for pain.   amiodarone 200 MG tablet Commonly known as: PACERONE Take 1 tablet (200 mg total) by mouth daily.   clobetasol cream 0.05 % Commonly known as: TEMOVATE Apply 1 application topically 2 (two) times daily as needed (psoriasis (elbows)).   ferrous sulfate 325 (65 FE) MG EC tablet Take 325 mg by mouth 2 (two) times a week. Tuesdays and Thursdays   finasteride 5 MG tablet Commonly known as: PROSCAR Take 1 tablet (5 mg total) by mouth daily.   Fish Oil 1200 MG Caps Take 1,200 mg by mouth 2 (two) times daily.   Lubricant Eye Drops 0.4-0.3 % Soln Generic drug: Polyethyl Glycol-Propyl Glycol Place 1 drop into both eyes daily as needed (dry/irritated eyes.).   methocarbamol 500 MG tablet Commonly known as: ROBAXIN Take 1 tablet (500 mg total) by mouth every 6 (six) hours as needed for muscle spasms.   metoprolol succinate 50 MG 24 hr tablet Commonly  known as: TOPROL-XL Take 1 tablet (50 mg total) by mouth daily. Take with or immediately following a meal. What changed:   medication strength  how much to take  additional instructions   omeprazole 40 MG capsule Commonly known as: PRILOSEC Take 40 mg by mouth at bedtime.   oxyCODONE 5 MG immediate release tablet Commonly known as: Oxy IR/ROXICODONE Take 1 tablet (5 mg total) by mouth daily as needed for severe pain or breakthrough pain.   rosuvastatin 20 MG tablet Commonly known as: CRESTOR Take 20 mg by mouth at bedtime.   senna-docusate 8.6-50 MG tablet Commonly known as: Senokot-S Take 1 tablet by mouth 2 (two) times daily.   warfarin 2.5 MG tablet Commonly known as: COUMADIN Take 2 tablets (5 mg total) by mouth daily at 6 PM.            Durable Medical Equipment  (From admission, onward)         Start     Ordered   01/08/19 1742  For home use only DME 3 n 1  Once     01/08/19 1741         Follow-up Information    Hamrick, Maura L, MD. Schedule an appointment as soon as possible for a visit in 1 week(s).   Specialty: Family Medicine Why:  Hospital follow-up Contact information: [place] [address] [phone]        Belva Crome, MD. Go on 01/15/2019.   Specialty: Cardiology Why: Regular scheduled appointment. Coumadin clinic. Contact information: [id number] [place] [address] [phone]        Alexis Frock, MD Follow up on 01/16/2019.   Specialty: Urology Why: Regular scheduled appointment. Contact information: [place] [address] [phone]          Allergies  Allergen Reactions   Bee Pollen Anaphylaxis    Allergic to bees   Nsaids     Duodenal ulcer.  anticoagulated    Consultations:  Urology   Procedures/Studies: Ct Angio Chest Pe W Or Wo Contrast  Result Date: 01/05/2019 CLINICAL DATA:  Shortness of breath. Dyspnea on exertion. EXAM: CT  ANGIOGRAPHY CHEST WITH CONTRAST TECHNIQUE: Multidetector CT imaging of the chest was performed using the standard protocol during bolus administration of intravenous contrast. Multiplanar CT image reconstructions and MIPs were obtained to evaluate the vascular anatomy. CONTRAST:  22mL ISOVUE-370 IOPAMIDOL (ISOVUE-370) INJECTION 76% COMPARISON:  Chest x-ray dated 12/22/2018 FINDINGS: Cardiovascular: There are multiple bilateral pulmonary emboli to all 3 lobes on the right and both lobes on the left. RV LV ratio is normal. The heart size is normal. No pericardial effusion. Mediastinum/Nodes: No enlarged mediastinal, hilar, or axillary lymph nodes. Thyroid gland, trachea, and esophagus demonstrate no significant findings. Lungs/Pleura: Tiny bilateral pleural effusions. Minimal atelectasis at the lung bases posteriorly. Lungs are otherwise clear. Upper Abdomen: Normal. Musculoskeletal: No chest wall abnormality. No acute or significant osseous findings. Review of the MIP images confirms the above findings. IMPRESSION: 1. Multiple bilateral pulmonary emboli. 2. Tiny bilateral pleural effusions. Critical Value/emergent results were called by telephone at the time of interpretation on 01/05/2019 at 4:25 pm to providerKATHLEEN ZELLER , who verbally acknowledged these results. The patient was advised to go straight to the [facility] emergency room for treatment. Electronically Signed   By: Lorriane Shire M.D.   On: 01/05/2019 16:29   Ct Abdomen Pelvis W Contrast  Result Date: 12/22/2018 CLINICAL DATA:  Abdominal distention EXAM: CT ABDOMEN AND PELVIS WITH CONTRAST TECHNIQUE: Multidetector CT imaging of the abdomen and pelvis was performed using the standard protocol following bolus administration of intravenous contrast. CONTRAST:  153mL OMNIPAQUE IOHEXOL 300 MG/ML  SOLN COMPARISON:  12/03/2018 FINDINGS: Lower chest: Coronary artery calcifications.  No acute abnormality. Hepatobiliary: No focal hepatic abnormality.  Gallbladder unremarkable. Pancreas: No focal abnormality or ductal dilatation. Spleen: No focal abnormality.  Normal size. Adrenals/Urinary Tract: Mild left hydronephrosis. No hydronephrosis on the right. Adrenal glands unremarkable. There is a large abnormal filling defect filling much of the urinary bladder. This could reflect mass or blood clot. Foley catheter is present within the bladder. Stomach/Bowel: Normal appendix. Scattered colonic diverticulosis. No active diverticulitis. Stomach and small bowel decompressed, unremarkable. Vascular/Lymphatic: Aortic atherosclerosis. No enlarged abdominal or pelvic lymph nodes. Reproductive: Prostate enlargement. Other: No free fluid or free air. Musculoskeletal: No acute bony abnormality. IMPRESSION: Large irregular filling deflect within the urinary bladder which could reflect large bladder wall mass or blood clot. There is mild left hydroureteronephrosis. This could be further evaluated with cystoscopy. Aortic atherosclerosis.  Coronary artery disease. Colonic diverticulosis. Electronically Signed   By: Rolm Baptise M.D.   On: 12/22/2018 21:47   Dg Chest Portable 1 View  Result Date: 12/22/2018 CLINICAL DATA:  Hypotension EXAM: PORTABLE CHEST 1 VIEW COMPARISON:  November 30, 2018 FINDINGS: The heart size and mediastinal contours are within normal limits. Both lungs are clear. The visualized skeletal structures are unremarkable. IMPRESSION: No active disease. Electronically Signed   By: Constance Holster M.D.   On: 12/22/2018 20:28   Dg Abd Portable 1v  Result Date: 12/24/2018 CLINICAL DATA:  68 year old male with abdominal distention. EXAM: PORTABLE ABDOMEN - 1 VIEW COMPARISON:  CT of the abdomen pelvis dated 12/22/2018. FINDINGS: Borderline dilated air-filled loops of small bowel in the upper abdomen may represent mild ileus. Clinical correlation is recommended. Stool noted throughout the colon. No free air identified. There is degenerative changes of the spine.  Rounded density over the pelvis most consistent with blood clot full urinary bladder seen on the recent CT. IMPRESSION: 1. Borderline dilated air-filled loops of small bowel in the upper abdomen may represent mild ileus. 2. Density over the pelvis in keeping with blood filled urinary bladder. Electronically Signed   By: Anner Crete M.D.   On: 12/24/2018 23:27   Vas [place] Lower Extremity Venous (dvt)  Result Date: 01/07/2019  Lower Venous Study Indications: Pulmonary embolism.  Comparison Study: no prior Performing Technologist: June Leap RDMS, RVT  Examination Guidelines: A complete evaluation includes B-mode imaging, spectral Doppler, color Doppler, and power Doppler as needed of all accessible portions of each vessel. Bilateral testing is considered an integral part of a complete examination. Limited examinations for reoccurring indications may be performed as noted.  +---------+---------------+---------+-----------+----------+-------------------+  RIGHT     Compressibility Phasicity Spontaneity Properties Thrombus Aging       +---------+---------------+---------+-----------+----------+-------------------+  CFV       Full            Yes       Yes                                         +---------+---------------+---------+-----------+----------+-------------------+  SFJ       Full                                                                  +---------+---------------+---------+-----------+----------+-------------------+  FV Prox   Full                                                                  +---------+---------------+---------+-----------+----------+-------------------+  FV Mid    Full                                                                  +---------+---------------+---------+-----------+----------+-------------------+  FV Distal Full                                                                  +---------+---------------+---------+-----------+----------+-------------------+  PFV        Full                                                                  +---------+---------------+---------+-----------+----------+-------------------+  POP       Full            Yes       Yes                                         +---------+---------------+---------+-----------+----------+-------------------+  PTV       None                                             isolated DVT                                                                     proximal segment     +---------+---------------+---------+-----------+----------+-------------------+  PERO      Full                                                                  +---------+---------------+---------+-----------+----------+-------------------+   +---------+---------------+---------+-----------+----------+------------------+  LEFT      Compressibility Phasicity Spontaneity Properties Thrombus Aging      +---------+---------------+---------+-----------+----------+------------------+  CFV       Full            Yes       Yes                                        +---------+---------------+---------+-----------+----------+------------------+  SFJ       Full                                                                 +---------+---------------+---------+-----------+----------+------------------+  FV Prox   Full                                                                 +---------+---------------+---------+-----------+----------+------------------+  FV Mid    Full                                                                 +---------+---------------+---------+-----------+----------+------------------+  FV Distal Full                                                                 +---------+---------------+---------+-----------+----------+------------------+  PFV       Full                                                                 +---------+---------------+---------+-----------+----------+------------------+  POP       Full             Yes       Yes                                        +---------+---------------+---------+-----------+----------+------------------+  PTV       Full                                                                 +---------+---------------+---------+-----------+----------+------------------+  PERO      None                                             isolated DVT mid                                                                segment             +---------+---------------+---------+-----------+----------+------------------+     Summary: Right: Findings consistent with acute deep vein thrombosis involving the right posterior tibial veins. A cystic structure is found in the popliteal fossa. Left: Findings consistent with acute deep vein thrombosis involving the left peroneal veins. No cystic structure found in the popliteal fossa.  *See table(s) above for measurements and observations. Electronically signed by Ruta Hinds MD on 01/07/2019 at 11:05:32 AM.    Final       Subjective: No bleeding overnight.  Discharge Exam: Vitals:   01/13/19 2100 01/14/19 0502  BP: 126/70 103/69  Pulse: 64 (!) 58  Resp: 18 18  Temp: 98.4 F (36.9 C) 97.9 F (36.6 C)  SpO2: 98% 96%   Vitals:   01/13/19 0439 01/13/19 1345 01/13/19 2100 01/14/19 0502  BP: 108/61 115/70 126/70 103/69  Pulse: 69 71 64 (!) 58  Resp: 18 18 18 18   Temp: 98.1 F (36.7 C) 98.6 F (37 C) 98.4 F (36.9 C) 97.9 F (36.6 C)  TempSrc: Oral Oral Oral Oral  SpO2: 95% 96% 98% 96%  Weight:  Height:        General: Pt is alert, awake, not in acute distress Cardiovascular: RRR, S1/S2 +, no rubs, no gallops Respiratory: CTA bilaterally, no wheezing, no rhonchi Abdominal: Soft, NT, ND, bowel sounds + Extremities: no edema, no cyanosis    The results of significant diagnostics from this hospitalization (including imaging, microbiology, ancillary and laboratory) are listed below for reference.     Microbiology: Recent  Results (from the past 240 hour(s))  SARS CORONAVIRUS 2 (TAT 6-24 HRS) Nasopharyngeal Nasopharyngeal Swab     Status: None   Collection Time: 01/05/19  6:20 PM   Specimen: Nasopharyngeal Swab  Result Value Ref Range Status   SARS Coronavirus 2 NEGATIVE NEGATIVE Final    Comment: (NOTE) SARS-CoV-2 target nucleic acids are NOT DETECTED. The SARS-CoV-2 RNA is generally detectable in upper and lower respiratory specimens during the acute phase of infection. Negative results do not preclude SARS-CoV-2 infection, do not rule out co-infections with other pathogens, and should not be used as the sole basis for treatment or other patient management decisions. Negative results must be combined with clinical observations, patient history, and epidemiological information. The expected result is Negative. Fact Sheet for Patients: SugarRoll.be Fact Sheet for Healthcare Providers: https://www.woods-mathews.com/ This test is not yet approved or cleared by the [place] FDA and  has been authorized for detection and/or diagnosis of SARS-CoV-2 by FDA under an Emergency Use Authorization (EUA). This EUA will remain  in effect (meaning this test can be used) for the duration of the COVID-19 declaration under Section 56 4(b)(1) of the Act, 21 U.S.C. section 360bbb-3(b)(1), unless the authorization is terminated or revoked sooner. Performed at [facility], [place] [address] [place], [place] [address]   Culture, Urine     Status: None   Collection Time: 01/06/19  7:40 AM   Specimen: Urine, Clean Catch  Result Value Ref Range Status   Specimen Description   Final    URINE, CLEAN CATCH Performed at [facility], [place] [address] [place], [place] [address]    Special Requests   Final    NONE Performed at [facility], [place] [address] [place], [place] [address]    Culture   Final    NO GROWTH Performed at [facility], [place] [address], [place],  [address]    Report Status 01/07/2019 FINAL  Final     Labs: BNP (last 3 results) Recent Labs    01/05/19 1730  BNP [id number]*   Basic Metabolic Panel: Recent Labs  Lab 01/08/19 0242 01/09/19 0211 01/10/19 0409 01/13/19 1444  NA 139 136 135 134*  K 4.3 4.2 4.4 5.1  CL 110 105 104 103  CO2 20* 22 21* 23  GLUCOSE 110* 116* 105* 116*  BUN 10 12 10 13   CREATININE 0.94 0.91 0.95 1.06  CALCIUM 8.5* 8.7* 9.1 9.0  MG 2.1  --   --   --    Liver Function Tests: No results for input(s): AST, ALT, ALKPHOS, BILITOT, PROT, ALBUMIN in the last 168 hours. No results for input(s): LIPASE, AMYLASE in the last 168 hours. No results for input(s): AMMONIA in the last 168 hours. CBC: Recent Labs  Lab 01/10/19 0409 01/11/19 0448 01/12/19 0429 01/13/19 0443 01/13/19 1444 01/14/19 0343  WBC 10.9* 8.9 10.0 9.7  --  9.5  HGB 10.2* 10.4* 10.6* 11.1* 11.4* 10.9*  HCT 33.6* 34.6* 34.4* 36.2* 38.2* 36.5*  MCV 91.3 91.8 90.5 90.3  --  91.7  PLT 421* 426* 403* 425*  --  394   Cardiac Enzymes: No results for input(s): CKTOTAL, CKMB, CKMBINDEX, TROPONINI in the last 168 hours. BNP: Invalid input(s): POCBNP CBG: Recent Labs  Lab 01/11/19 2057  GLUCAP 134*   D-Dimer No results for input(s): DDIMER in the last 72 hours. Hgb A1c No results for input(s): HGBA1C in the last 72 hours. Lipid Profile No results for input(s): CHOL, HDL, LDLCALC, TRIG, CHOLHDL, LDLDIRECT in the last 72 hours. Thyroid function studies No results for input(s): TSH, T4TOTAL, T3FREE, THYROIDAB in the last 72 hours.  Invalid input(s): FREET3 Anemia work up No results for input(s): VITAMINB12, FOLATE, FERRITIN, TIBC, IRON, RETICCTPCT in the last 72 hours. Urinalysis    Component Value Date/Time   COLORURINE YELLOW 01/05/2019 2047   APPEARANCEUR CLEAR 01/05/2019 2047   LABSPEC >1.046 (H) 01/05/2019 2047   PHURINE 6.0 01/05/2019 2047   GLUCOSEU NEGATIVE 01/05/2019  2047   HGBUR SMALL (A) 01/05/2019 2047   BILIRUBINUR NEGATIVE 01/05/2019 2047   KETONESUR NEGATIVE 01/05/2019 2047   PROTEINUR 100 (A) 01/05/2019 2047   NITRITE NEGATIVE 01/05/2019 2047   LEUKOCYTESUR MODERATE (A) 01/05/2019 2047   Sepsis Labs Invalid input(s): PROCALCITONIN,  WBC,  LACTICIDVEN Microbiology Recent Results (from the past 240 hour(s))  SARS CORONAVIRUS 2 (TAT 6-24 HRS) Nasopharyngeal Nasopharyngeal Swab     Status: None   Collection Time: 01/05/19  6:20 PM   Specimen: Nasopharyngeal Swab  Result Value Ref Range Status   SARS Coronavirus 2 NEGATIVE NEGATIVE Final    Comment: (NOTE) SARS-CoV-2 target nucleic acids are NOT DETECTED. The SARS-CoV-2 RNA is generally detectable in upper and lower respiratory specimens during the acute phase of infection. Negative results do not preclude SARS-CoV-2 infection, do not rule out co-infections with other pathogens, and should not be used as the sole basis for treatment or other patient management decisions. Negative results must be combined with clinical observations, patient history, and epidemiological information. The expected result is Negative. Fact Sheet for Patients: SugarRoll.be Fact Sheet for Healthcare Providers: https://www.woods-mathews.com/ This test is not yet approved or cleared by the [place] FDA and  has been authorized for detection and/or diagnosis of SARS-CoV-2 by FDA under an Emergency Use Authorization (EUA). This EUA will remain  in effect (meaning this test can be used) for the duration of the COVID-19 declaration under Section 56 4(b)(1) of the Act, 21 U.S.C. section 360bbb-3(b)(1), unless the authorization is terminated or revoked sooner. Performed at [facility], [place] [address], [place], [place] [address]   Culture, Urine     Status: None   Collection Time: 01/06/19  7:40 AM   Specimen: Urine, Clean Catch  Result Value Ref Range Status    Specimen Description   Final    URINE, CLEAN CATCH Performed at [facility], [place] [address] [place], [place] [address]    Special Requests   Final    NONE Performed at [facility], [place] [address], [place], [place] [address]    Culture   Final    NO GROWTH Performed at [facility], [place] [address], [place], [place] [address]    Report Status 01/07/2019 FINAL  Final     Time coordinating discharge: 35 minutes  SIGNED:   Cordelia Poche, MD [facility] 01/14/2019, 12:49 PM

## 2019-01-14 NOTE — Discharge Instructions (Signed)
Philip Jackson,  You were in the hospital with pulmonary emboli. This is being treated with blood thinners. You were started on Coumadin. This will have to be managed closely in the office to make sure your levels are correct. Please keep your appointment with your heart doctor so he can arrange for your INR levels to be checked while on Coumadin. Please also follow-up with the urologist for your foley catheter.   Information on my medicine - Coumadin   (Warfarin)  This medication education was reviewed with me or my healthcare representative as part of my discharge preparation.  The pharmacist that spoke with me during my hospital stay was:  Mohammed  Abuzuaiter, Student-PharmD  Why was Coumadin prescribed for you? Coumadin was prescribed for you because you have a blood clot or a medical condition that can cause an increased risk of forming blood clots. Blood clots can cause serious health problems by blocking the flow of blood to the heart, lung, or brain. Coumadin can prevent harmful blood clots from forming. As a reminder your indication for Coumadin is:   Pulmonary Embolism Treatment You are also taking this for Deep Vein Thrombosis (DVT) Treatment and your history of Atrial Fibrillation (Afib).  What test will check on my response to Coumadin? While on Coumadin (warfarin) you will need to have an INR test regularly to ensure that your dose is keeping you in the desired range. The INR (international normalized ratio) number is calculated from the result of the laboratory test called prothrombin time (PT).  If an INR APPOINTMENT HAS NOT ALREADY BEEN MADE FOR YOU please schedule an appointment to have this lab work done by your health care provider within 7 days. Your INR goal is usually a number between:  2 to 3 or your provider may give you a more narrow range like 2-2.5.  Ask your health care provider during an office visit what your goal INR is.   What  do you need to  know  About   COUMADIN? Take Coumadin (warfarin) exactly as prescribed by your healthcare provider about the same time each day.  DO NOT stop taking without talking to the doctor who prescribed the medication.  Stopping without other blood clot prevention medication to take the place of Coumadin may increase your risk of developing a new clot or stroke.  Get refills before you run out.  What do you do if you miss a dose? If you miss a dose, take it as soon as you remember on the same day then continue your regularly scheduled regimen the next day.  Do not take two doses of Coumadin at the same time.  Important Safety Information A possible side effect of Coumadin (Warfarin) is an increased risk of bleeding. You should call your healthcare provider right away if you experience any of the following: ? Bleeding from an injury or your nose that does not stop. ? Unusual colored urine (red or dark brown) or unusual colored stools (red or black). ? Unusual bruising for unknown reasons. ? A serious fall or if you hit your head (even if there is no bleeding).  Some foods or medicines interact with Coumadin (warfarin) and might alter your response to warfarin. To help avoid this: ? Eat a balanced diet, maintaining a consistent amount of Vitamin K. ? Notify your provider about major diet changes you plan to make. ? Avoid alcohol or limit your intake to 1 drink for women and 2 drinks for men per day. (1  drink is 5 oz. wine, 12 oz. beer, or 1.5 oz. liquor.)  Make sure that ANY health care provider who prescribes medication for you knows that you are taking Coumadin (warfarin).  Also make sure the healthcare provider who is monitoring your Coumadin knows when you have started a new medication including herbals and non-prescription products.  Coumadin (Warfarin)  Major Drug Interactions  Increased Warfarin Effect Decreased Warfarin Effect  Alcohol (large quantities) Antibiotics (esp. Septra/Bactrim, Flagyl,  Cipro) Amiodarone (Cordarone) Aspirin (ASA) Cimetidine (Tagamet) Megestrol (Megace) NSAIDs (ibuprofen, naproxen, etc.) Piroxicam (Feldene) Propafenone (Rythmol SR) Propranolol (Inderal) Isoniazid (INH) Posaconazole (Noxafil) Barbiturates (Phenobarbital) Carbamazepine (Tegretol) Chlordiazepoxide (Librium) Cholestyramine (Questran) Griseofulvin Oral Contraceptives Rifampin Sucralfate (Carafate) Vitamin K   Coumadin (Warfarin) Major Herbal Interactions  Increased Warfarin Effect Decreased Warfarin Effect  Garlic Ginseng Ginkgo biloba Coenzyme Q10 Green tea St. Johns wort    Coumadin (Warfarin) FOOD Interactions  Eat a consistent number of servings per week of foods HIGH in Vitamin K (1 serving =  cup)  Collards (cooked, or boiled & drained) Kale (cooked, or boiled & drained) Mustard greens (cooked, or boiled & drained) Parsley *serving size only =  cup Spinach (cooked, or boiled & drained) Swiss chard (cooked, or boiled & drained) Turnip greens (cooked, or boiled & drained)  Eat a consistent number of servings per week of foods MEDIUM-HIGH in Vitamin K (1 serving = 1 cup)  Asparagus (cooked, or boiled & drained) Broccoli (cooked, boiled & drained, or raw & chopped) Brussel sprouts (cooked, or boiled & drained) *serving size only =  cup Lettuce, raw (green leaf, endive, romaine) Spinach, raw Turnip greens, raw & chopped   These websites have more information on Coumadin (warfarin):  FailFactory.se; VeganReport.com.au;

## 2019-01-14 NOTE — Progress Notes (Signed)
Patient discharged to home with family. Discharge instructions reviewed with patient who verbalized understanding. New RX sent to pharmacy electronically.

## 2019-01-14 NOTE — Progress Notes (Signed)
Patient ID: Philip Jackson, male   DOB: [birth year], 68 y.o.   MRN: [id number]    Subjective: Pt doing well over last 16 hours. No issues with catheter.  Objective: Vital signs in last 24 hours: Temp:  [97.9 F (36.6 C)-98.6 F (37 C)] 97.9 F (36.6 C) (09/27 0502) Pulse Rate:  [58-71] 58 (09/27 0502) Resp:  [18] 18 (09/27 0502) BP: (103-126)/(69-70) 103/69 (09/27 0502) SpO2:  [96 %-98 %] 96 % (09/27 0502)  Intake/Output from previous day: 09/26 0701 - 09/27 0700 In: 1802.5 [P.O.:1560; I.V.:242.5] Out: 3700 [Urine:3700] Intake/Output this shift: No intake/output data recorded.  Physical Exam:  GU: Urine is completely clear with excellent output  Lab Results: Recent Labs    01/13/19 0443 01/13/19 1444 01/14/19 0343  HGB 11.1* 11.4* 10.9*  HCT 36.2* 38.2* 36.5*   BMET Recent Labs    01/13/19 1444  NA 134*  K 5.1  CL 103  CO2 23  GLUCOSE 116*  BUN 13  CREATININE 1.06  CALCIUM 9.0     Studies/Results: INR 2.2  Assessment/Plan: Hematuria: Resolved. INR still therapeutic.  Ok to discharge with catheter from urologic standpoint.  F/U with Dr. Tresa Moore Tuesday as scheduled.   LOS: 9 days   Dutch Gray 01/14/2019, 7:45 AM

## 2019-01-15 ENCOUNTER — Ambulatory Visit (INDEPENDENT_AMBULATORY_CARE_PROVIDER_SITE_OTHER): Payer: BC Managed Care – PPO | Admitting: Internal Medicine

## 2019-01-15 ENCOUNTER — Other Ambulatory Visit: Payer: Self-pay

## 2019-01-15 ENCOUNTER — Encounter: Payer: Self-pay | Admitting: Internal Medicine

## 2019-01-15 VITALS — BP 138/84 | HR 96 | Ht 69.0 in | Wt 177.2 lb

## 2019-01-15 DIAGNOSIS — Z7901 Long term (current) use of anticoagulants: Secondary | ICD-10-CM

## 2019-01-15 DIAGNOSIS — I471 Supraventricular tachycardia: Secondary | ICD-10-CM | POA: Diagnosis not present

## 2019-01-15 DIAGNOSIS — I480 Paroxysmal atrial fibrillation: Secondary | ICD-10-CM

## 2019-01-15 DIAGNOSIS — I10 Essential (primary) hypertension: Secondary | ICD-10-CM | POA: Diagnosis not present

## 2019-01-15 NOTE — Patient Instructions (Addendum)
Medication Instructions:  Your physician recommends that you continue on your current medications as directed. Please refer to the Current Medication list given to you today.  Labwork: None ordered.  Testing/Procedures: None ordered.  Follow-Up: Your physician wants you to follow-up in: 4 months with Dr. Lovena Le.   You will receive a reminder letter in the mail two months in advance. If you don't receive a letter, please call our office to schedule the follow-up appointment.  Any Other Special Instructions Will Be Listed Below (If Applicable).  If you need a refill on your cardiac medications before your next appointment, please call your pharmacy.
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
## 2019-01-16 DIAGNOSIS — R338 Other retention of urine: Secondary | ICD-10-CM | POA: Diagnosis not present

## 2019-01-16 DIAGNOSIS — R972 Elevated prostate specific antigen [PSA]: Secondary | ICD-10-CM | POA: Diagnosis not present

## 2019-01-16 DIAGNOSIS — R310 Gross hematuria: Secondary | ICD-10-CM | POA: Diagnosis not present
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
## 2019-01-17 ENCOUNTER — Ambulatory Visit: Payer: BC Managed Care – PPO | Admitting: Cardiology

## 2019-01-17 DIAGNOSIS — G4733 Obstructive sleep apnea (adult) (pediatric): Secondary | ICD-10-CM | POA: Diagnosis not present

## 2019-01-18 ENCOUNTER — Other Ambulatory Visit: Payer: Self-pay

## 2019-01-18 ENCOUNTER — Ambulatory Visit (INDEPENDENT_AMBULATORY_CARE_PROVIDER_SITE_OTHER): Payer: BC Managed Care – PPO | Admitting: *Deleted

## 2019-01-18 DIAGNOSIS — I480 Paroxysmal atrial fibrillation: Secondary | ICD-10-CM

## 2019-01-18 DIAGNOSIS — I82403 Acute embolism and thrombosis of unspecified deep veins of lower extremity, bilateral: Secondary | ICD-10-CM | POA: Diagnosis not present

## 2019-01-18 DIAGNOSIS — Z5181 Encounter for therapeutic drug level monitoring: Secondary | ICD-10-CM | POA: Diagnosis not present

## 2019-01-18 LAB — POCT INR: INR: 2.5 (ref 2.0–3.0)

## 2019-01-18 NOTE — Patient Instructions (Addendum)
A full discussion of the nature of anticoagulants has been carried out.  A benefit risk analysis has been presented to the patient, so that they understand the justification for choosing anticoagulation at this time. The need for frequent and regular monitoring, precise dosage adjustment and compliance is stressed.  Side effects of potential bleeding are discussed.  The patient should avoid any OTC items containing aspirin or ibuprofen, and should avoid great swings in general diet.  Avoid alcohol consumption.  Call if any signs of abnormal bleeding.    Description   Take 2 tablets daily except for 1 tablet on Mondays and Fridays. Recheck INR in 1 week. [facility] [phone]

## 2019-01-23 ENCOUNTER — Encounter: Payer: Self-pay | Admitting: Physical Therapy

## 2019-01-23 ENCOUNTER — Ambulatory Visit: Payer: BC Managed Care – PPO | Attending: Student | Admitting: Physical Therapy

## 2019-01-23 ENCOUNTER — Other Ambulatory Visit: Payer: Self-pay

## 2019-01-23 DIAGNOSIS — M25561 Pain in right knee: Secondary | ICD-10-CM | POA: Diagnosis not present

## 2019-01-23 DIAGNOSIS — R600 Localized edema: Secondary | ICD-10-CM | POA: Diagnosis not present

## 2019-01-23 DIAGNOSIS — M25661 Stiffness of right knee, not elsewhere classified: Secondary | ICD-10-CM | POA: Diagnosis not present

## 2019-01-23 DIAGNOSIS — R262 Difficulty in walking, not elsewhere classified: Secondary | ICD-10-CM | POA: Diagnosis not present
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
## 2019-01-25 ENCOUNTER — Other Ambulatory Visit: Payer: Self-pay

## 2019-01-25 ENCOUNTER — Ambulatory Visit (INDEPENDENT_AMBULATORY_CARE_PROVIDER_SITE_OTHER): Payer: BC Managed Care – PPO

## 2019-01-25 DIAGNOSIS — I82403 Acute embolism and thrombosis of unspecified deep veins of lower extremity, bilateral: Secondary | ICD-10-CM | POA: Diagnosis not present

## 2019-01-25 DIAGNOSIS — I480 Paroxysmal atrial fibrillation: Secondary | ICD-10-CM | POA: Diagnosis not present

## 2019-01-25 DIAGNOSIS — Z5181 Encounter for therapeutic drug level monitoring: Secondary | ICD-10-CM

## 2019-01-25 LAB — POCT INR: INR: 2.9 (ref 2.0–3.0)

## 2019-01-25 NOTE — Patient Instructions (Signed)
Description   Continue on same dosage 2 tablets daily except for 1 tablet on Mondays and Fridays. Recheck INR in 1 week. Coumadin Clinic [phone]

## 2019-01-29 ENCOUNTER — Other Ambulatory Visit: Payer: Self-pay | Admitting: Interventional Cardiology

## 2019-01-29 ENCOUNTER — Telehealth: Payer: Self-pay | Admitting: Interventional Cardiology

## 2019-01-29 MED ORDER — WARFARIN SODIUM 2.5 MG PO TABS: 5.0000 mg | ORAL_TABLET | Freq: Every day | ORAL | 0 refills | Status: DC

## 2019-01-29 NOTE — Telephone Encounter (Signed)
° ° °*  STAT* If patient is at the pharmacy, call can be transferred to refill team.   1. Which medications need to be refilled? (please list name of each medication and dose if known) warfarin (COUMADIN) 2.5 MG tablet  2. Which pharmacy/location (including street and city if local pharmacy) is medication to be sent to? [facility] - [address] [place] 3. Do they need a 30 day or 90 day supply? [place]

## 2019-01-29 NOTE — Telephone Encounter (Signed)
° ° ° ° °  Pt c/o medication issue:  1. Name of Medication: Amoxi-Clav 875mg   2. How are you currently taking this medication (dosage and times per day)?   3. Are you having a reaction (difficulty breathing--STAT)? no  4. What is your medication issue? Spouse calling to report patient started taking Amoxi- Clav after a cat bite.

## 2019-01-29 NOTE — Telephone Encounter (Signed)
Wife is calling into the office on behalf of the pt to inform coumadin clinic that the pt will need a refill on his coumadin (see additional note open from today, from the operator), and they wanted to inform our Clinic that the pt was started on Amoxicillin for a cat bite, and they want to make sure this does not affect his coumadin.  Informed the pts wife Philip Jackson, that I will forward this message to our Coumadin clinic to further review these request, and follow-up with the pt and/or wife thereafter.  Wife verbalized understanding and agrees with this plan.

## 2019-01-29 NOTE — Telephone Encounter (Signed)
Called and spoke with patient. Advised that augmentin does not typically interact with warfarin and not changes need to be made. Keep follow up in clinic on 10/15. Informed patient that refill for warfarin has been sent to pharmacy.

## 2019-01-31 ENCOUNTER — Ambulatory Visit: Payer: BC Managed Care – PPO | Admitting: Physical Therapy

## 2019-01-31 ENCOUNTER — Other Ambulatory Visit: Payer: Self-pay

## 2019-01-31 ENCOUNTER — Encounter: Payer: Self-pay | Admitting: Physical Therapy

## 2019-01-31 DIAGNOSIS — M25661 Stiffness of right knee, not elsewhere classified: Secondary | ICD-10-CM | POA: Diagnosis not present

## 2019-01-31 DIAGNOSIS — R600 Localized edema: Secondary | ICD-10-CM

## 2019-01-31 DIAGNOSIS — R262 Difficulty in walking, not elsewhere classified: Secondary | ICD-10-CM | POA: Diagnosis not present

## 2019-01-31 DIAGNOSIS — M25561 Pain in right knee: Secondary | ICD-10-CM | POA: Diagnosis not present
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
## 2019-02-01 ENCOUNTER — Ambulatory Visit (INDEPENDENT_AMBULATORY_CARE_PROVIDER_SITE_OTHER): Payer: BC Managed Care – PPO

## 2019-02-01 DIAGNOSIS — Z5181 Encounter for therapeutic drug level monitoring: Secondary | ICD-10-CM | POA: Diagnosis not present

## 2019-02-01 DIAGNOSIS — I480 Paroxysmal atrial fibrillation: Secondary | ICD-10-CM | POA: Diagnosis not present

## 2019-02-01 DIAGNOSIS — I82403 Acute embolism and thrombosis of unspecified deep veins of lower extremity, bilateral: Secondary | ICD-10-CM

## 2019-02-01 LAB — POCT INR: INR: 2.5 (ref 2.0–3.0)

## 2019-02-01 NOTE — Patient Instructions (Signed)
Description   Continue on same dosage 2 tablets daily except for 1 tablet on Mondays and Fridays. Recheck INR in 2 weeks. Coumadin Clinic [phone]

## 2019-02-02 ENCOUNTER — Ambulatory Visit: Payer: BC Managed Care – PPO | Admitting: Physical Therapy

## 2019-02-02 ENCOUNTER — Other Ambulatory Visit: Payer: Self-pay

## 2019-02-02 DIAGNOSIS — M25661 Stiffness of right knee, not elsewhere classified: Secondary | ICD-10-CM

## 2019-02-02 DIAGNOSIS — R262 Difficulty in walking, not elsewhere classified: Secondary | ICD-10-CM | POA: Diagnosis not present

## 2019-02-02 DIAGNOSIS — M25561 Pain in right knee: Secondary | ICD-10-CM | POA: Diagnosis not present

## 2019-02-02 DIAGNOSIS — R600 Localized edema: Secondary | ICD-10-CM | POA: Diagnosis not present
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
## 2019-02-05 ENCOUNTER — Other Ambulatory Visit: Payer: Self-pay

## 2019-02-05 ENCOUNTER — Encounter: Payer: Self-pay | Admitting: Physical Therapy

## 2019-02-05 ENCOUNTER — Ambulatory Visit: Payer: BC Managed Care – PPO | Admitting: Physical Therapy

## 2019-02-05 DIAGNOSIS — M25561 Pain in right knee: Secondary | ICD-10-CM | POA: Diagnosis not present

## 2019-02-05 DIAGNOSIS — M25661 Stiffness of right knee, not elsewhere classified: Secondary | ICD-10-CM

## 2019-02-05 DIAGNOSIS — R262 Difficulty in walking, not elsewhere classified: Secondary | ICD-10-CM

## 2019-02-05 DIAGNOSIS — R600 Localized edema: Secondary | ICD-10-CM
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
## 2019-02-07 ENCOUNTER — Encounter: Payer: Self-pay | Admitting: Physical Therapy

## 2019-02-07 ENCOUNTER — Other Ambulatory Visit: Payer: Self-pay

## 2019-02-07 ENCOUNTER — Ambulatory Visit: Payer: BC Managed Care – PPO | Admitting: Physical Therapy

## 2019-02-07 DIAGNOSIS — M25661 Stiffness of right knee, not elsewhere classified: Secondary | ICD-10-CM

## 2019-02-07 DIAGNOSIS — M25561 Pain in right knee: Secondary | ICD-10-CM | POA: Diagnosis not present

## 2019-02-07 DIAGNOSIS — R600 Localized edema: Secondary | ICD-10-CM | POA: Diagnosis not present

## 2019-02-07 DIAGNOSIS — R262 Difficulty in walking, not elsewhere classified: Secondary | ICD-10-CM
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
## 2019-02-09 ENCOUNTER — Other Ambulatory Visit: Payer: Self-pay

## 2019-02-09 ENCOUNTER — Other Ambulatory Visit: Payer: Self-pay | Admitting: Interventional Cardiology

## 2019-02-09 ENCOUNTER — Ambulatory Visit: Payer: BC Managed Care – PPO | Admitting: Physical Therapy

## 2019-02-09 DIAGNOSIS — M25561 Pain in right knee: Secondary | ICD-10-CM | POA: Diagnosis not present

## 2019-02-09 DIAGNOSIS — R262 Difficulty in walking, not elsewhere classified: Secondary | ICD-10-CM | POA: Diagnosis not present

## 2019-02-09 DIAGNOSIS — M25661 Stiffness of right knee, not elsewhere classified: Secondary | ICD-10-CM

## 2019-02-09 DIAGNOSIS — R600 Localized edema: Secondary | ICD-10-CM | POA: Diagnosis not present

## 2019-02-09 MED ORDER — AMIODARONE HCL 200 MG PO TABS: 200.0000 mg | ORAL_TABLET | Freq: Every day | ORAL | 3 refills | Status: DC
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
## 2019-02-09 NOTE — Therapy (Deleted)
[place], [place], [address] Phone: [phone]   Fax:  [phone]  Patient Details  Name: Philip Jackson MRN: [id number] Date of Birth: Apr 05, 1951 Referring Provider:  [facility]  Encounter Date: 02/09/2019   [facility] 02/09/2019, 9:37 AM  [place], [place], [address] Phone: [phone]   Fax:  [phone]

## 2019-02-09 NOTE — Telephone Encounter (Signed)
Pt's medication was sent to pt's pharmacy as requested. Confirmation received.  °

## 2019-02-13 DIAGNOSIS — R338 Other retention of urine: Secondary | ICD-10-CM | POA: Diagnosis not present

## 2019-02-15 ENCOUNTER — Other Ambulatory Visit: Payer: Self-pay

## 2019-02-15 ENCOUNTER — Ambulatory Visit: Payer: BC Managed Care – PPO | Admitting: Physical Therapy

## 2019-02-15 ENCOUNTER — Ambulatory Visit (INDEPENDENT_AMBULATORY_CARE_PROVIDER_SITE_OTHER): Payer: BC Managed Care – PPO | Admitting: *Deleted

## 2019-02-15 DIAGNOSIS — M25561 Pain in right knee: Secondary | ICD-10-CM | POA: Diagnosis not present

## 2019-02-15 DIAGNOSIS — I2699 Other pulmonary embolism without acute cor pulmonale: Secondary | ICD-10-CM

## 2019-02-15 DIAGNOSIS — I82403 Acute embolism and thrombosis of unspecified deep veins of lower extremity, bilateral: Secondary | ICD-10-CM | POA: Diagnosis not present

## 2019-02-15 DIAGNOSIS — R600 Localized edema: Secondary | ICD-10-CM | POA: Diagnosis not present

## 2019-02-15 DIAGNOSIS — Z5181 Encounter for therapeutic drug level monitoring: Secondary | ICD-10-CM

## 2019-02-15 DIAGNOSIS — R262 Difficulty in walking, not elsewhere classified: Secondary | ICD-10-CM | POA: Diagnosis not present

## 2019-02-15 DIAGNOSIS — M25661 Stiffness of right knee, not elsewhere classified: Secondary | ICD-10-CM | POA: Diagnosis not present

## 2019-02-15 DIAGNOSIS — I480 Paroxysmal atrial fibrillation: Secondary | ICD-10-CM

## 2019-02-15 LAB — POCT INR: INR: 1.9 — AB (ref 2.0–3.0)

## 2019-02-15 NOTE — Patient Instructions (Signed)
Description   Today take 2.5 tablets then continue on same dosage 2 tablets daily except for 1 tablet on Mondays and Fridays. Recheck INR in 2 weeks. [facility] [phone]
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
## 2019-02-16 ENCOUNTER — Ambulatory Visit: Payer: BC Managed Care – PPO | Admitting: Physical Therapy

## 2019-02-19 ENCOUNTER — Telehealth: Payer: Self-pay | Admitting: Interventional Cardiology

## 2019-02-19 ENCOUNTER — Other Ambulatory Visit: Payer: Self-pay

## 2019-02-19 ENCOUNTER — Encounter: Payer: Self-pay | Admitting: Physical Therapy

## 2019-02-19 ENCOUNTER — Ambulatory Visit: Payer: BC Managed Care – PPO | Attending: Student | Admitting: Physical Therapy

## 2019-02-19 DIAGNOSIS — R262 Difficulty in walking, not elsewhere classified: Secondary | ICD-10-CM | POA: Diagnosis not present

## 2019-02-19 DIAGNOSIS — M25661 Stiffness of right knee, not elsewhere classified: Secondary | ICD-10-CM | POA: Diagnosis not present

## 2019-02-19 DIAGNOSIS — M25561 Pain in right knee: Secondary | ICD-10-CM

## 2019-02-19 DIAGNOSIS — R600 Localized edema: Secondary | ICD-10-CM
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
## 2019-02-19 NOTE — Telephone Encounter (Signed)
Left message to call back  

## 2019-02-19 NOTE — Telephone Encounter (Signed)
  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 He needs to know if he is medically able to continue driving.

## 2019-02-19 NOTE — Telephone Encounter (Signed)
Will route to Dr. Smith for review.  

## 2019-02-19 NOTE — Telephone Encounter (Signed)
Patient called back to make sure Dr. Tamala Julian was aware that his medical card expires on 02/24/19.

## 2019-02-20 NOTE — Telephone Encounter (Signed)
I spoke to the patient who said that he will reach out to his PCP for the letter and he will have them fax to Dr Tamala Julian to review.

## 2019-02-20 NOTE — Telephone Encounter (Signed)
Patient returned call

## 2019-02-20 NOTE — Telephone Encounter (Signed)
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  Letter should be faxed to [phone]

## 2019-02-20 NOTE — Telephone Encounter (Signed)
Pt aware will discuss with PCP .Philip Jackson

## 2019-02-21 ENCOUNTER — Ambulatory Visit: Payer: BC Managed Care – PPO | Admitting: Physical Therapy

## 2019-02-21 ENCOUNTER — Other Ambulatory Visit: Payer: Self-pay

## 2019-02-21 DIAGNOSIS — M25561 Pain in right knee: Secondary | ICD-10-CM | POA: Diagnosis not present

## 2019-02-21 DIAGNOSIS — R262 Difficulty in walking, not elsewhere classified: Secondary | ICD-10-CM | POA: Diagnosis not present

## 2019-02-21 DIAGNOSIS — M25661 Stiffness of right knee, not elsewhere classified: Secondary | ICD-10-CM | POA: Diagnosis not present

## 2019-02-21 DIAGNOSIS — R600 Localized edema: Secondary | ICD-10-CM | POA: Diagnosis not present
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
## 2019-02-23 ENCOUNTER — Ambulatory Visit: Payer: BC Managed Care – PPO | Admitting: Physical Therapy

## 2019-02-23 ENCOUNTER — Other Ambulatory Visit: Payer: Self-pay

## 2019-02-23 DIAGNOSIS — M25661 Stiffness of right knee, not elsewhere classified: Secondary | ICD-10-CM

## 2019-02-23 DIAGNOSIS — R262 Difficulty in walking, not elsewhere classified: Secondary | ICD-10-CM | POA: Diagnosis not present

## 2019-02-23 DIAGNOSIS — R600 Localized edema: Secondary | ICD-10-CM | POA: Diagnosis not present

## 2019-02-23 DIAGNOSIS — M25561 Pain in right knee: Secondary | ICD-10-CM | POA: Diagnosis not present
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
## 2019-02-27 ENCOUNTER — Other Ambulatory Visit: Payer: Self-pay

## 2019-02-27 ENCOUNTER — Ambulatory Visit: Payer: BC Managed Care – PPO | Admitting: Physical Therapy

## 2019-02-27 DIAGNOSIS — M25661 Stiffness of right knee, not elsewhere classified: Secondary | ICD-10-CM | POA: Diagnosis not present

## 2019-02-27 DIAGNOSIS — R262 Difficulty in walking, not elsewhere classified: Secondary | ICD-10-CM

## 2019-02-27 DIAGNOSIS — M25561 Pain in right knee: Secondary | ICD-10-CM | POA: Diagnosis not present

## 2019-02-27 DIAGNOSIS — R600 Localized edema: Secondary | ICD-10-CM | POA: Diagnosis not present
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
## 2019-02-28 ENCOUNTER — Encounter: Payer: Self-pay | Admitting: Interventional Cardiology

## 2019-02-28 ENCOUNTER — Ambulatory Visit: Payer: BC Managed Care – PPO | Admitting: Interventional Cardiology

## 2019-02-28 ENCOUNTER — Encounter: Payer: Self-pay | Admitting: Physical Therapy

## 2019-02-28 ENCOUNTER — Ambulatory Visit: Payer: BC Managed Care – PPO | Admitting: Physical Therapy

## 2019-02-28 VITALS — BP 138/74 | HR 72 | Ht 69.0 in | Wt 190.1 lb

## 2019-02-28 DIAGNOSIS — Z5181 Encounter for therapeutic drug level monitoring: Secondary | ICD-10-CM

## 2019-02-28 DIAGNOSIS — Z7901 Long term (current) use of anticoagulants: Secondary | ICD-10-CM

## 2019-02-28 DIAGNOSIS — R262 Difficulty in walking, not elsewhere classified: Secondary | ICD-10-CM

## 2019-02-28 DIAGNOSIS — G4733 Obstructive sleep apnea (adult) (pediatric): Secondary | ICD-10-CM | POA: Diagnosis not present

## 2019-02-28 DIAGNOSIS — M25561 Pain in right knee: Secondary | ICD-10-CM | POA: Diagnosis not present

## 2019-02-28 DIAGNOSIS — M25661 Stiffness of right knee, not elsewhere classified: Secondary | ICD-10-CM | POA: Diagnosis not present

## 2019-02-28 DIAGNOSIS — I480 Paroxysmal atrial fibrillation: Secondary | ICD-10-CM | POA: Diagnosis not present

## 2019-02-28 DIAGNOSIS — R600 Localized edema: Secondary | ICD-10-CM | POA: Diagnosis not present

## 2019-02-28 DIAGNOSIS — I10 Essential (primary) hypertension: Secondary | ICD-10-CM | POA: Diagnosis not present

## 2019-02-28 DIAGNOSIS — Z7189 Other specified counseling: Secondary | ICD-10-CM

## 2019-02-28 NOTE — Patient Instructions (Signed)
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
## 2019-03-01 ENCOUNTER — Ambulatory Visit (INDEPENDENT_AMBULATORY_CARE_PROVIDER_SITE_OTHER): Payer: BC Managed Care – PPO | Admitting: *Deleted

## 2019-03-01 ENCOUNTER — Other Ambulatory Visit: Payer: Self-pay

## 2019-03-01 DIAGNOSIS — I480 Paroxysmal atrial fibrillation: Secondary | ICD-10-CM | POA: Diagnosis not present

## 2019-03-01 DIAGNOSIS — Z5181 Encounter for therapeutic drug level monitoring: Secondary | ICD-10-CM | POA: Diagnosis not present

## 2019-03-01 DIAGNOSIS — I82403 Acute embolism and thrombosis of unspecified deep veins of lower extremity, bilateral: Secondary | ICD-10-CM

## 2019-03-01 LAB — POCT INR: INR: 2 (ref 2.0–3.0)

## 2019-03-01 NOTE — Patient Instructions (Signed)
Description   Continue on same dosage 2 tablets daily except for 1 tablet on Mondays and Fridays. Recheck INR in 3 weeks. Coumadin Clinic [phone]

## 2019-03-05 ENCOUNTER — Other Ambulatory Visit: Payer: Self-pay | Admitting: Interventional Cardiology

## 2019-03-05 MED ORDER — WARFARIN SODIUM 2.5 MG PO TABS: ORAL_TABLET | ORAL | 1 refills | Status: DC

## 2019-03-14 DIAGNOSIS — R338 Other retention of urine: Secondary | ICD-10-CM | POA: Diagnosis not present

## 2019-03-22 ENCOUNTER — Ambulatory Visit (INDEPENDENT_AMBULATORY_CARE_PROVIDER_SITE_OTHER): Payer: BC Managed Care – PPO

## 2019-03-22 ENCOUNTER — Other Ambulatory Visit: Payer: Self-pay

## 2019-03-22 DIAGNOSIS — Z5181 Encounter for therapeutic drug level monitoring: Secondary | ICD-10-CM

## 2019-03-22 DIAGNOSIS — I82403 Acute embolism and thrombosis of unspecified deep veins of lower extremity, bilateral: Secondary | ICD-10-CM | POA: Diagnosis not present

## 2019-03-22 DIAGNOSIS — I2699 Other pulmonary embolism without acute cor pulmonale: Secondary | ICD-10-CM | POA: Diagnosis not present

## 2019-03-22 DIAGNOSIS — I480 Paroxysmal atrial fibrillation: Secondary | ICD-10-CM

## 2019-03-22 LAB — POCT INR: INR: 1.5 — AB (ref 2.0–3.0)

## 2019-03-22 NOTE — Patient Instructions (Signed)
Description   Take 3 tablets today, then start taking 2 tablets daily except for 1 tablet on Mondays. Recheck INR in 2 weeks. Coumadin Clinic [phone]

## 2019-03-26 ENCOUNTER — Telehealth: Payer: Self-pay | Admitting: Interventional Cardiology

## 2019-03-26 NOTE — Telephone Encounter (Signed)
Spoke with pt and made him aware, per our last OV note he was taking 50mg  QD and we did not want to change anything.  Pt verbalized understanding and thanked me for the call.

## 2019-03-26 NOTE — Telephone Encounter (Signed)
New Message   Pt c/o medication issue:  1. Name of Medication: metoprolol succinate (TOPROL-XL) 50 MG 24 hr tablet   2. How are you currently taking this medication (dosage and times per day)?   3. Are you having a reaction (difficulty breathing--STAT)?   4. What is your medication issue? Patient is wanting to confirm what dosage and times per day should he be taking this medication. Please call.

## 2019-04-05 ENCOUNTER — Other Ambulatory Visit: Payer: Self-pay

## 2019-04-05 ENCOUNTER — Ambulatory Visit (INDEPENDENT_AMBULATORY_CARE_PROVIDER_SITE_OTHER): Payer: BC Managed Care – PPO | Admitting: *Deleted

## 2019-04-05 DIAGNOSIS — I480 Paroxysmal atrial fibrillation: Secondary | ICD-10-CM | POA: Diagnosis not present

## 2019-04-05 DIAGNOSIS — I2699 Other pulmonary embolism without acute cor pulmonale: Secondary | ICD-10-CM

## 2019-04-05 DIAGNOSIS — Z5181 Encounter for therapeutic drug level monitoring: Secondary | ICD-10-CM

## 2019-04-05 DIAGNOSIS — I82403 Acute embolism and thrombosis of unspecified deep veins of lower extremity, bilateral: Secondary | ICD-10-CM

## 2019-04-05 LAB — POCT INR: INR: 1.8 — AB (ref 2.0–3.0)

## 2019-04-05 NOTE — Patient Instructions (Signed)
Description   Take 3 tablets today, then start taking 2 tablets daily. Recheck INR in 2 weeks. Coumadin Clinic [phone]

## 2019-04-17 DIAGNOSIS — R310 Gross hematuria: Secondary | ICD-10-CM | POA: Diagnosis not present

## 2019-04-17 DIAGNOSIS — R972 Elevated prostate specific antigen [PSA]: Secondary | ICD-10-CM | POA: Diagnosis not present

## 2019-04-17 DIAGNOSIS — R338 Other retention of urine: Secondary | ICD-10-CM | POA: Diagnosis not present

## 2019-04-25 ENCOUNTER — Other Ambulatory Visit: Payer: Self-pay

## 2019-04-25 ENCOUNTER — Ambulatory Visit (INDEPENDENT_AMBULATORY_CARE_PROVIDER_SITE_OTHER): Payer: Self-pay | Admitting: *Deleted

## 2019-04-25 DIAGNOSIS — Z5181 Encounter for therapeutic drug level monitoring: Secondary | ICD-10-CM

## 2019-04-25 DIAGNOSIS — I82403 Acute embolism and thrombosis of unspecified deep veins of lower extremity, bilateral: Secondary | ICD-10-CM

## 2019-04-25 DIAGNOSIS — I480 Paroxysmal atrial fibrillation: Secondary | ICD-10-CM

## 2019-04-25 DIAGNOSIS — I2699 Other pulmonary embolism without acute cor pulmonale: Secondary | ICD-10-CM

## 2019-04-25 LAB — POCT INR: INR: 2.5 (ref 2.0–3.0)

## 2019-04-25 NOTE — Patient Instructions (Addendum)
Description   Continue taking 2 tablets daily. Recheck INR in 3 weeks with Renee's appt. Coumadin Clinic [phone]

## 2019-05-08 ENCOUNTER — Other Ambulatory Visit: Payer: Self-pay

## 2019-05-08 MED ORDER — WARFARIN SODIUM 2.5 MG PO TABS: ORAL_TABLET | ORAL | 1 refills | Status: DC
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
## 2019-05-16 ENCOUNTER — Ambulatory Visit: Payer: BC Managed Care – PPO | Admitting: Physician Assistant

## 2019-05-16 ENCOUNTER — Other Ambulatory Visit: Payer: Self-pay

## 2019-05-16 ENCOUNTER — Ambulatory Visit (INDEPENDENT_AMBULATORY_CARE_PROVIDER_SITE_OTHER): Payer: BC Managed Care – PPO

## 2019-05-16 ENCOUNTER — Encounter (INDEPENDENT_AMBULATORY_CARE_PROVIDER_SITE_OTHER): Payer: Self-pay

## 2019-05-16 VITALS — BP 150/82 | HR 62 | Ht 69.0 in | Wt 200.0 lb

## 2019-05-16 DIAGNOSIS — I82403 Acute embolism and thrombosis of unspecified deep veins of lower extremity, bilateral: Secondary | ICD-10-CM | POA: Diagnosis not present

## 2019-05-16 DIAGNOSIS — I471 Supraventricular tachycardia, unspecified: Secondary | ICD-10-CM

## 2019-05-16 DIAGNOSIS — Z5181 Encounter for therapeutic drug level monitoring: Secondary | ICD-10-CM

## 2019-05-16 DIAGNOSIS — I480 Paroxysmal atrial fibrillation: Secondary | ICD-10-CM

## 2019-05-16 DIAGNOSIS — Z79899 Other long term (current) drug therapy: Secondary | ICD-10-CM | POA: Diagnosis not present

## 2019-05-16 DIAGNOSIS — I10 Essential (primary) hypertension: Secondary | ICD-10-CM | POA: Diagnosis not present

## 2019-05-16 DIAGNOSIS — I2699 Other pulmonary embolism without acute cor pulmonale: Secondary | ICD-10-CM

## 2019-05-16 LAB — POCT INR: INR: 2.3 (ref 2.0–3.0)

## 2019-05-16 MED ORDER — AMIODARONE HCL 100 MG PO TABS: 100.0000 mg | ORAL_TABLET | Freq: Every day | ORAL | 1 refills | Status: DC

## 2019-05-16 NOTE — Patient Instructions (Addendum)
Medication Instructions:   START TAKING AMIODARONE 100 MG  ONCE A DAY    *If you need a refill on your cardiac medications before your next appointment, please call your pharmacy*  Lab Work:  LFT AND TSH TODAY    If you have labs (blood work) drawn today and your tests are completely normal, you will receive your results only by: Marland Kitchen MyChart Message (if you have MyChart) OR . A paper copy in the mail If you have any lab test that is abnormal or we need to change your treatment, we will call you to review the results.  Testing/Procedures: NONE ORDERED  TODAY   Follow-Up: At [facility], you and your health needs are our priority.  As part of our continuing mission to provide you with exceptional heart care, we have created designated Provider Care Teams.  These Care Teams include your primary Cardiologist (physician) and Advanced Practice Providers (APPs -  Physician Assistants and Nurse Practitioners) who all work together to provide you with the care you need, when you need it.  Your next appointment:   4 month(s)  The format for your next appointment:   In Person  Provider:    You may see  Dr. Lovena Le    Other Instructions

## 2019-05-16 NOTE — Patient Instructions (Signed)
Description   Continue taking 2 tablets daily. Recheck INR in 4 weeks. Coumadin Clinic [phone]

## 2019-05-17 LAB — HEPATIC FUNCTION PANEL
ALT: 28 IU/L (ref 0–44)
AST: 21 IU/L (ref 0–40)
Albumin: 4.2 g/dL (ref 3.8–4.8)
Alkaline Phosphatase: 72 IU/L (ref 39–117)
Bilirubin Total: 0.2 mg/dL (ref 0.0–1.2)
Bilirubin, Direct: 0.1 mg/dL (ref 0.00–0.40)
Total Protein: 6.9 g/dL (ref 6.0–8.5)

## 2019-05-17 LAB — TSH: TSH: 1.33 u[IU]/mL (ref 0.450–4.500)

## 2019-05-21 ENCOUNTER — Emergency Department (HOSPITAL_COMMUNITY)
Admission: EM | Admit: 2019-05-21 | Discharge: 2019-05-21 | Disposition: A | Discharge status: Home / Self Care | Payer: BC Managed Care – PPO | Attending: Emergency Medicine | Admitting: Emergency Medicine

## 2019-05-21 DIAGNOSIS — Z85828 Personal history of other malignant neoplasm of skin: Secondary | ICD-10-CM | POA: Diagnosis not present

## 2019-05-21 DIAGNOSIS — Z87891 Personal history of nicotine dependence: Secondary | ICD-10-CM | POA: Insufficient documentation

## 2019-05-21 DIAGNOSIS — Z96651 Presence of right artificial knee joint: Secondary | ICD-10-CM | POA: Insufficient documentation

## 2019-05-21 DIAGNOSIS — R5381 Other malaise: Secondary | ICD-10-CM | POA: Diagnosis not present

## 2019-05-21 DIAGNOSIS — I10 Essential (primary) hypertension: Secondary | ICD-10-CM | POA: Diagnosis not present

## 2019-05-21 NOTE — Discharge Instructions (Signed)
You were evaluated in the Emergency Department and after careful evaluation, we did not find any emergent condition requiring admission or further testing in the hospital.  Your exam/testing today is overall reassuring.  As discussed, please mention these high blood pressures to your primary care doctor.  Please return to the Emergency Department if you experience any worsening of your condition.  We encourage you to follow up with a primary care provider.  Thank you for allowing [place] to be a part of your care.
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
## 2019-05-21 NOTE — ED Triage Notes (Signed)
Transported by [employer] from work for hypertension-- initial BP of 191/100 other vital signs stable. Hx of Hx and Afib. Denies any chest pain, shob, abdominal pain, n/v or HA. Takes Metoprolol for HTN. Has a cardiologist.

## 2019-05-22 DIAGNOSIS — R338 Other retention of urine: Secondary | ICD-10-CM | POA: Diagnosis not present

## 2019-05-25 DIAGNOSIS — R310 Gross hematuria: Secondary | ICD-10-CM | POA: Diagnosis not present

## 2019-05-29 DIAGNOSIS — R338 Other retention of urine: Secondary | ICD-10-CM | POA: Diagnosis not present

## 2019-05-29 DIAGNOSIS — N390 Urinary tract infection, site not specified: Secondary | ICD-10-CM | POA: Diagnosis not present

## 2019-05-29 DIAGNOSIS — R310 Gross hematuria: Secondary | ICD-10-CM | POA: Diagnosis not present

## 2019-05-29 DIAGNOSIS — B9689 Other specified bacterial agents as the cause of diseases classified elsewhere: Secondary | ICD-10-CM | POA: Diagnosis not present

## 2019-06-04 ENCOUNTER — Telehealth: Payer: Self-pay | Admitting: Internal Medicine

## 2019-06-04 NOTE — Telephone Encounter (Signed)
We are recommending the COVID-19 vaccine to all of our patients. Cardiac medications (including blood thinners) should not deter anyone from being vaccinated and there is no need to hold any of those medications prior to vaccine administration.     Currently, there is a hotline to call (active 04/27/19) to schedule vaccination appointments as no walk-ins will be accepted.   Number: [phone].    If an appointment is not available please go to [email or web address] to sign up for notification when additional vaccine appointments are available.   If you have further questions or concerns about the vaccine process, please visit [email or web address] or contact your primary care physician.   Pt was read the above informartion, he said he understood it.

## 2019-06-06 ENCOUNTER — Telehealth: Payer: Self-pay | Admitting: Interventional Cardiology

## 2019-06-06 ENCOUNTER — Other Ambulatory Visit: Payer: Self-pay | Admitting: Urology

## 2019-06-06 NOTE — Telephone Encounter (Signed)
New message     [facility] Pre-operative Risk Assessment    Request for surgical clearance:  1. What type of surgery is being performed? Prostatectomy  2. When is this surgery scheduled? 07/13/19   3. What type of clearance is required (medical clearance vs. Pharmacy clearance to hold med vs. Both)?Both  4. Are there any medications that need to be held prior to surgery and how long?Aspirin and Coumadin 5 days prior to surgery   5. Practice name and name of physician performing surgery? Dr. Alexis Frock, [facility]  6. What is your office phone [phone] ext [phone]    7.   What is your office fax number [phone]  8.   Anesthesia type (None, local, MAC, general) ? General    Philip Jackson 06/06/2019, 11:16 AM  _________________________________________________________________   (provider comments below)

## 2019-06-07 NOTE — Telephone Encounter (Signed)
Please comment on coumadin. 

## 2019-06-07 NOTE — Telephone Encounter (Signed)
   Primary Cardiologist: Sinclair Grooms, MD  Chart reviewed as part of pre-operative protocol coverage. Patient was contacted 06/07/2019 in reference to pre-operative risk assessment for pending surgery as outlined below.  Philip Jackson was last seen on 05/16/19 by Tommye Standard.  Since that day, Philip Jackson has done well.  He does not have a history of CAD or MI. He can complete 4.0 METS without angina.   Per our clinical pharmacist: Pt takes warfarin for afib with CHADS2VASc score of 4 (age, HTN, VTE) and hx of bilateral DVT and bilateral PE in Sept 2020.  He will require Lovenox bridging in order to hold warfarin for 5 days prior to procedure. Will coordinate this at [facility] Coumadin clinic where pt is followed.  Therefore, based on ACC/AHA guidelines, the patient would be at acceptable risk for the planned procedure without further cardiovascular testing.   I will route this recommendation to the requesting party via Epic fax function and remove from pre-op pool.  Please call with questions.  [place], PA 06/07/2019, 3:08 PM

## 2019-06-07 NOTE — Telephone Encounter (Signed)
Pt takes warfarin for afib with CHADS2VASc score of 4 (age, HTN, VTE) and hx of bilateral DVT and bilateral PE in Sept 2020.  He will require Lovenox bridging in order to hold warfarin for 5 days prior to procedure. Will coordinate this at [facility] Cou[facility] clinic where pt is followed.

## 2019-06-13 ENCOUNTER — Ambulatory Visit (INDEPENDENT_AMBULATORY_CARE_PROVIDER_SITE_OTHER): Payer: BC Managed Care – PPO | Admitting: *Deleted

## 2019-06-13 ENCOUNTER — Other Ambulatory Visit: Payer: Self-pay

## 2019-06-13 DIAGNOSIS — I480 Paroxysmal atrial fibrillation: Secondary | ICD-10-CM

## 2019-06-13 DIAGNOSIS — I82403 Acute embolism and thrombosis of unspecified deep veins of lower extremity, bilateral: Secondary | ICD-10-CM

## 2019-06-13 DIAGNOSIS — Z5181 Encounter for therapeutic drug level monitoring: Secondary | ICD-10-CM | POA: Diagnosis not present

## 2019-06-13 LAB — POCT INR: INR: 1.8 — AB (ref 2.0–3.0)

## 2019-06-13 NOTE — Patient Instructions (Signed)
Description   Today take 3 tablets then continue taking 2 tablets daily. Recheck INR in 3 weeks to prepare for Lovenox Injection. Coumadin Clinic [phone]

## 2019-06-19 NOTE — Telephone Encounter (Signed)
Closing encounter

## 2019-06-25 DIAGNOSIS — H524 Presbyopia: Secondary | ICD-10-CM | POA: Diagnosis not present

## 2019-06-25 DIAGNOSIS — H25013 Cortical age-related cataract, bilateral: Secondary | ICD-10-CM | POA: Diagnosis not present

## 2019-06-25 DIAGNOSIS — H40013 Open angle with borderline findings, low risk, bilateral: Secondary | ICD-10-CM | POA: Diagnosis not present

## 2019-06-25 DIAGNOSIS — H2513 Age-related nuclear cataract, bilateral: Secondary | ICD-10-CM | POA: Diagnosis not present

## 2019-06-25 DIAGNOSIS — H35033 Hypertensive retinopathy, bilateral: Secondary | ICD-10-CM | POA: Diagnosis not present

## 2019-07-03 DIAGNOSIS — R338 Other retention of urine: Secondary | ICD-10-CM | POA: Diagnosis not present

## 2019-07-03 DIAGNOSIS — R310 Gross hematuria: Secondary | ICD-10-CM | POA: Diagnosis not present

## 2019-07-04 ENCOUNTER — Ambulatory Visit (INDEPENDENT_AMBULATORY_CARE_PROVIDER_SITE_OTHER): Payer: BC Managed Care – PPO | Admitting: Pharmacist

## 2019-07-04 ENCOUNTER — Other Ambulatory Visit: Payer: Self-pay

## 2019-07-04 DIAGNOSIS — Z5181 Encounter for therapeutic drug level monitoring: Secondary | ICD-10-CM | POA: Diagnosis not present

## 2019-07-04 DIAGNOSIS — I82403 Acute embolism and thrombosis of unspecified deep veins of lower extremity, bilateral: Secondary | ICD-10-CM

## 2019-07-04 DIAGNOSIS — I480 Paroxysmal atrial fibrillation: Secondary | ICD-10-CM | POA: Diagnosis not present

## 2019-07-04 LAB — POCT INR: INR: 2.6 (ref 2.0–3.0)

## 2019-07-04 MED ORDER — ENOXAPARIN SODIUM 150 MG/ML ~~LOC~~ SOLN: 150.0000 mg | SUBCUTANEOUS | 0 refills | Status: DC

## 2019-07-04 NOTE — Patient Instructions (Signed)
3/20: Last dose of Coumadin.  3/21: No Coumadin or Lovenox.  3/22: Inject Lovenox 150 mg in the fatty abdominal tissue at least 2 inches from the belly button once daily at 9pm rotate sites. No Coumadin.  3/23: Inject Lovenox in the fatty tissue at 9pm. No Coumadin.  3/24: Inject Lovenox in the fatty tissue at 9pm. No Coumadin.  3/25: No Lovenox. No Coumadin.  3/26: Procedure Day - No Lovenox - Resume Coumadin in the evening or as directed by doctor (take an extra half tablet with usual dose for 2 days then resume normal dose).  3/27: Resume Lovenox inject in the fatty tissue every 24 hours in the AM and take Coumadin.  3/28: Inject Lovenox in the fatty tissue every 24 hours and take Coumadin.  3/29: Inject Lovenox in the fatty tissue every 24 hours and take Coumadin.  3/30: Inject Lovenox in the fatty tissue every 24 hours and take Coumadin.  3/31: Inject Lovenox in the fatty tissue every 24 hours and take Coumadin.  4/1: Coumadin appt to check INR.

## 2019-07-04 NOTE — Patient Instructions (Addendum)
DUE TO COVID-19 ONLY ONE VISITOR IS ALLOWED TO COME WITH YOU AND STAY IN THE WAITING ROOM ONLY DURING PRE OP AND PROCEDURE DAY OF SURGERY. THE 1 VISITOR MAY VISIT WITH YOU AFTER SURGERY IN YOUR PRIVATE ROOM DURING VISITING HOURS ONLY!  YOU NEED TO HAVE A COVID 19 TEST ON 07-10-19 @ 2:50PM , THIS TEST MUST BE DONE BEFORE SURGERY, COME  [address], [place] , [address].  ([place]) ONCE YOUR COVID TEST IS COMPLETED, PLEASE BEGIN THE QUARANTINE INSTRUCTIONS AS OUTLINED IN YOUR HANDOUT.                Philip Jackson  07/04/2019   Your procedure is scheduled on: 07-13-19   Report to [facility] Main  Entrance    Report to Admitting at 9:00 AM     Call this number if you have problems the morning of surgery [phone]    Remember: Please consume a Clear Liquid Diet the Day Before Surgery.  Do not eat food or drink liquids after Midnight.     Take these medicines the morning of surgery with A SIP OF WATER: Amiodarone (Pacerone), Finasteride (Proscar), and Metoprolol succinate  BRUSH YOUR TEETH MORNING OF SURGERY AND RINSE YOUR MOUTH OUT, NO CHEWING GUM CANDY OR MINTS.   CLEAR LIQUID DIET   Foods Allowed                                                                     Foods Excluded  Coffee and tea, regular and decaf                             liquids that you cannot  Plain Jell-O any favor except red or purple                                           see through such as: Fruit ices (not with fruit pulp)                                     milk, soups, orange juice  Iced Popsicles                                    All solid food Carbonated beverages, regular and diet                                    Cranberry, grape and apple juices Sports drinks like Gatorade Lightly seasoned clear broth or consume(fat free) Sugar, honey syrup  Sample Menu Breakfast                                Lunch  Supper Cranberry juice                     Beef broth                            Chicken broth Jell-O                                     Grape juice                           Apple juice Coffee or tea                        Jell-O                                      Popsicle                                                Coffee or tea                        Coffee or tea  _____________________________________________________________________                                 Philip Jackson may not have any metal on your body including hair pins and              piercings     Do not wear jewelry, cologne, lotions, powders or deodorant                        Men may shave face and neck.   Do not bring valuables to the hospital. [place].  Contacts, dentures or bridgework may not be worn into surgery.       Patients discharged the day of surgery will not be allowed to drive home. IF YOU ARE HAVING SURGERY AND GOING HOME THE SAME DAY, YOU MUST HAVE AN ADULT TO DRIVE YOU HOME AND BE WITH YOU FOR 24 HOURS. YOU MAY GO HOME BY TAXI OR UBER OR ORTHERWISE, BUT AN ADULT MUST ACCOMPANY YOU HOME AND STAY WITH YOU FOR 24 HOURS.  Name and phone number of your driver: Philip Jackson [phone]  Special Instructions: N/A              Please read over the following fact sheets you were given: _____________________________________________________________________             [facility] - Preparing for Surgery Before surgery, you can play an important role.  Because skin is not sterile, your skin needs to be as free of germs as possible.  You can reduce the number of germs on your skin by washing with CHG (chlorahexidine gluconate) soap before surgery.  CHG is an antiseptic cleaner which kills germs and bonds with the skin to continue killing germs even after washing. Please  DO NOT use if you have an allergy to CHG or antibacterial soaps.  If your skin becomes reddened/irritated stop  using the CHG and inform your nurse when you arrive at Short Stay. Do not shave (including legs and underarms) for at least 48 hours prior to the first CHG shower.  You may shave your face/neck. Please follow these instructions carefully:  1.  Shower with CHG Soap the night before surgery and the  morning of Surgery.  2.  If you choose to wash your hair, wash your hair first as usual with your  normal  shampoo.  3.  After you shampoo, rinse your hair and body thoroughly to remove the  shampoo.                           4.  Use CHG as you would any other liquid soap.  You can apply chg directly  to the skin and wash                       Gently with a scrungie or clean washcloth.  5.  Apply the CHG Soap to your body ONLY FROM THE NECK DOWN.   Do not use on face/ open                           Wound or open sores. Avoid contact with eyes, ears mouth and genitals (private parts).                       Wash face,  Genitals (private parts) with your normal soap.             6.  Wash thoroughly, paying special attention to the area where your surgery  will be performed.  7.  Thoroughly rinse your body with warm water from the neck down.  8.  DO NOT shower/wash with your normal soap after using and rinsing off  the CHG Soap.                9.  Pat yourself dry with a clean towel.            10.  Wear clean pajamas.            11.  Place clean sheets on your bed the night of your first shower and do not  sleep with pets. Day of Surgery : Do not apply any lotions/deodorants the morning of surgery.  Please wear clean clothes to the hospital/surgery center.  FAILURE TO FOLLOW THESE INSTRUCTIONS MAY RESULT IN THE CANCELLATION OF YOUR SURGERY PATIENT SIGNATURE_________________________________  NURSE SIGNATURE__________________________________  ________________________________________________________________________

## 2019-07-04 NOTE — Progress Notes (Addendum)
PCP -  Cardiologist - Daneen Schick w/Cardiac Clearance from [facility], [place] date 06-06-19  Chest x-ray - 12-22-18  EKG - 01-06-19 Stress Test -  ECHO - 01-06-19  Cardiac Cath -   Sleep Study -  CPAP -   Fasting Blood Sugar -  Checks Blood Sugar _____ times a day  Blood Thinner Instructions: COUMADIN, with instructions to hold x 5 days, bridge with Lovenox Aspirin Instructions: Last Dose:  Anesthesia review:   Patient denies shortness of breath, fever, cough and chest pain at PAT appointment   Patient verbalized understanding of instructions that were given to them at the PAT appointment. Patient was also instructed that they will need to review over the PAT instructions again at home before surgery.

## 2019-07-04 NOTE — Progress Notes (Signed)
Reviewed how to inject lovenox with patient and reviewed instructions in detail with patient  3/20: Last dose of Coumadin.  3/21: No Coumadin or Lovenox.  3/22: Inject Lovenox 150 mg in the fatty abdominal tissue at least 2 inches from the belly button once daily at 9pm rotate sites. No Coumadin.  3/23: Inject Lovenox in the fatty tissue at 9pm. No Coumadin.  3/24: Inject Lovenox in the fatty tissue at 9pm. No Coumadin.  3/25: No Lovenox. No Coumadin.  3/26: Procedure Day - No Lovenox - Resume Coumadin in the evening or as directed by doctor (take an extra half tablet with usual dose for 2 days then resume normal dose).  3/27: Resume Lovenox inject in the fatty tissue every 24 hours in the AM and take Coumadin.  3/28: Inject Lovenox in the fatty tissue every 24 hours and take Coumadin.  3/29: Inject Lovenox in the fatty tissue every 24 hours and take Coumadin.  3/30: Inject Lovenox in the fatty tissue every 24 hours and take Coumadin.  3/31: Inject Lovenox in the fatty tissue every 24 hours and take Coumadin.  4/1: Coumadin appt to check INR.

## 2019-07-06 ENCOUNTER — Encounter (HOSPITAL_COMMUNITY)
Admission: RE | Admit: 2019-07-06 | Discharge: 2019-07-06 | Disposition: A | Discharge status: Home / Self Care | Payer: BC Managed Care – PPO | Source: Ambulatory Visit | Attending: Urology | Admitting: Urology

## 2019-07-06 ENCOUNTER — Other Ambulatory Visit: Payer: Self-pay

## 2019-07-06 ENCOUNTER — Encounter (HOSPITAL_COMMUNITY): Payer: Self-pay

## 2019-07-06 DIAGNOSIS — Z01812 Encounter for preprocedural laboratory examination: Secondary | ICD-10-CM | POA: Insufficient documentation

## 2019-07-06 HISTORY — DX: Other complications of anesthesia, initial encounter: T88.59XA

## 2019-07-06 LAB — BASIC METABOLIC PANEL
Anion gap: 9 (ref 5–15)
BUN: 14 mg/dL (ref 8–23)
CO2: 27 mmol/L (ref 22–32)
Calcium: 8.7 mg/dL — ABNORMAL LOW (ref 8.9–10.3)
Chloride: 106 mmol/L (ref 98–111)
Creatinine, Ser: 0.84 mg/dL (ref 0.61–1.24)
GFR calc Af Amer: >60 mL/min (ref >60)
GFR calc non Af Amer: >60 mL/min (ref >60)
Glucose, Bld: 124 mg/dL — ABNORMAL HIGH (ref 70–99)
Potassium: 4.1 mmol/L (ref 3.5–5.1)
Sodium: 142 mmol/L (ref 135–145)

## 2019-07-06 LAB — CBC
HCT: 44.5 % (ref 39.0–52.0)
Hemoglobin: 13.9 g/dL (ref 13.0–17.0)
MCH: 28.3 pg (ref 26.0–34.0)
MCHC: 31.2 g/dL (ref 30.0–36.0)
MCV: 90.6 fL (ref 80.0–100.0)
Platelets: 280 10*3/uL (ref 150–400)
RBC: 4.91 MIL/uL (ref 4.22–5.81)
RDW: 15.4 % (ref 11.5–15.5)
WBC: 8 10*3/uL (ref 4.0–10.5)
nRBC: 0 % (ref 0.0–0.2)

## 2019-07-10 ENCOUNTER — Other Ambulatory Visit (HOSPITAL_COMMUNITY)
Admission: RE | Admit: 2019-07-10 | Discharge: 2019-07-10 | Disposition: A | Discharge status: Home / Self Care | Payer: BC Managed Care – PPO | Source: Ambulatory Visit | Attending: Urology | Admitting: Urology

## 2019-07-10 DIAGNOSIS — Z01812 Encounter for preprocedural laboratory examination: Secondary | ICD-10-CM | POA: Insufficient documentation

## 2019-07-10 DIAGNOSIS — Z20822 Contact with and (suspected) exposure to covid-19: Secondary | ICD-10-CM | POA: Diagnosis not present

## 2019-07-10 LAB — SARS CORONAVIRUS 2 (TAT 6-24 HRS): SARS Coronavirus 2: NEGATIVE

## 2019-07-13 ENCOUNTER — Encounter (HOSPITAL_COMMUNITY): Payer: Self-pay | Admitting: Urology

## 2019-07-13 ENCOUNTER — Encounter (HOSPITAL_COMMUNITY)
Admission: RE | Disposition: A | Discharge status: Home / Self Care | Payer: Self-pay | Source: Home / Self Care | Attending: Urology

## 2019-07-13 ENCOUNTER — Inpatient Hospital Stay (HOSPITAL_COMMUNITY): Payer: BC Managed Care – PPO | Admitting: Anesthesiology

## 2019-07-13 ENCOUNTER — Other Ambulatory Visit: Payer: Self-pay

## 2019-07-13 ENCOUNTER — Inpatient Hospital Stay (HOSPITAL_COMMUNITY)
Admission: RE | Admit: 2019-07-13 | Discharge: 2019-07-14 | DRG: 707 | Disposition: A | Discharge status: Home / Self Care | Payer: BC Managed Care – PPO | Attending: Urology | Admitting: Urology

## 2019-07-13 DIAGNOSIS — K219 Gastro-esophageal reflux disease without esophagitis: Secondary | ICD-10-CM | POA: Diagnosis not present

## 2019-07-13 DIAGNOSIS — Z85828 Personal history of other malignant neoplasm of skin: Secondary | ICD-10-CM

## 2019-07-13 DIAGNOSIS — G4730 Sleep apnea, unspecified: Secondary | ICD-10-CM | POA: Diagnosis present

## 2019-07-13 DIAGNOSIS — R338 Other retention of urine: Secondary | ICD-10-CM | POA: Diagnosis not present

## 2019-07-13 DIAGNOSIS — I480 Paroxysmal atrial fibrillation: Secondary | ICD-10-CM | POA: Diagnosis present

## 2019-07-13 DIAGNOSIS — M19049 Primary osteoarthritis, unspecified hand: Secondary | ICD-10-CM | POA: Diagnosis not present

## 2019-07-13 DIAGNOSIS — I10 Essential (primary) hypertension: Secondary | ICD-10-CM | POA: Diagnosis present

## 2019-07-13 DIAGNOSIS — Z6831 Body mass index (BMI) 31.0-31.9, adult: Secondary | ICD-10-CM | POA: Diagnosis not present

## 2019-07-13 DIAGNOSIS — Z96651 Presence of right artificial knee joint: Secondary | ICD-10-CM | POA: Diagnosis present

## 2019-07-13 DIAGNOSIS — Z888 Allergy status to other drugs, medicaments and biological substances status: Secondary | ICD-10-CM

## 2019-07-13 DIAGNOSIS — Z87891 Personal history of nicotine dependence: Secondary | ICD-10-CM

## 2019-07-13 DIAGNOSIS — N401 Enlarged prostate with lower urinary tract symptoms: Secondary | ICD-10-CM | POA: Diagnosis not present

## 2019-07-13 DIAGNOSIS — N138 Other obstructive and reflux uropathy: Secondary | ICD-10-CM | POA: Diagnosis not present

## 2019-07-13 DIAGNOSIS — E785 Hyperlipidemia, unspecified: Secondary | ICD-10-CM | POA: Diagnosis not present

## 2019-07-13 DIAGNOSIS — E669 Obesity, unspecified: Secondary | ICD-10-CM | POA: Diagnosis not present

## 2019-07-13 DIAGNOSIS — Z8249 Family history of ischemic heart disease and other diseases of the circulatory system: Secondary | ICD-10-CM

## 2019-07-13 DIAGNOSIS — N400 Enlarged prostate without lower urinary tract symptoms: Secondary | ICD-10-CM | POA: Diagnosis not present

## 2019-07-13 HISTORY — PX: XI ROBOTIC ASSISTED SIMPLE PROSTATECTOMY: SHX6713

## 2019-07-13 LAB — TYPE AND SCREEN
ABO/RH(D): O POS
Antibody Screen: NEGATIVE

## 2019-07-13 LAB — CREATININE, SERUM
Creatinine, Ser: 0.91 mg/dL (ref 0.61–1.24)
GFR calc Af Amer: >60 mL/min (ref >60)
GFR calc non Af Amer: >60 mL/min (ref >60)

## 2019-07-13 LAB — HEMOGLOBIN AND HEMATOCRIT, BLOOD
HCT: 41.7 % (ref 39.0–52.0)
Hemoglobin: 13.2 g/dL (ref 13.0–17.0)

## 2019-07-13 SURGERY — PROSTATECTOMY, SIMPLE, ROBOT-ASSISTED
Anesthesia: General

## 2019-07-13 MED ORDER — ENOXAPARIN SODIUM 40 MG/0.4ML ~~LOC~~ SOLN
40.0000 mg | SUBCUTANEOUS | Status: DC
  Administered 2019-07-14: 40 mg via SUBCUTANEOUS
  Filled 2019-07-13: qty 0.4

## 2019-07-13 MED ORDER — MIDAZOLAM HCL 2 MG/2ML IJ SOLN
INTRAMUSCULAR | Status: DC | PRN
  Administered 2019-07-13: 2 mg via INTRAVENOUS

## 2019-07-13 MED ORDER — CHLORHEXIDINE GLUCONATE CLOTH 2 % EX PADS
6.0000 | MEDICATED_PAD | Freq: Every day | CUTANEOUS | Status: DC
  Administered 2019-07-14: 6 via TOPICAL

## 2019-07-13 MED ORDER — SODIUM CHLORIDE (PF) 0.9 % IJ SOLN
INTRAMUSCULAR | Status: DC | PRN
  Administered 2019-07-13: 20 mL

## 2019-07-13 MED ORDER — SUGAMMADEX SODIUM 200 MG/2ML IV SOLN
INTRAVENOUS | Status: DC | PRN
  Administered 2019-07-13: 200 mg via INTRAVENOUS

## 2019-07-13 MED ORDER — SODIUM CHLORIDE 0.9 % IV BOLUS
1000.0000 mL | Freq: Once | INTRAVENOUS | Status: AC
  Administered 2019-07-13: 1000 mL via INTRAVENOUS

## 2019-07-13 MED ORDER — MIDAZOLAM HCL 2 MG/2ML IJ SOLN
INTRAMUSCULAR | Status: AC
  Filled 2019-07-13: qty 2

## 2019-07-13 MED ORDER — METOPROLOL SUCCINATE ER 50 MG PO TB24
50.0000 mg | ORAL_TABLET | Freq: Every day | ORAL | Status: DC
  Administered 2019-07-14: 50 mg via ORAL
  Filled 2019-07-13: qty 1

## 2019-07-13 MED ORDER — SUFENTANIL CITRATE 50 MCG/ML IV SOLN
INTRAVENOUS | Status: AC
  Filled 2019-07-13: qty 1

## 2019-07-13 MED ORDER — LACTATED RINGERS IV SOLN: INTRAVENOUS | Status: DC

## 2019-07-13 MED ORDER — DEXTROSE-NACL 5-0.45 % IV SOLN: INTRAVENOUS | Status: DC

## 2019-07-13 MED ORDER — ACETAMINOPHEN 10 MG/ML IV SOLN: 1000.0000 mg | Freq: Once | INTRAVENOUS | Status: DC | PRN

## 2019-07-13 MED ORDER — CEFAZOLIN SODIUM-DEXTROSE 2-4 GM/100ML-% IV SOLN
2.0000 g | INTRAVENOUS | Status: AC
  Administered 2019-07-13: 2 g via INTRAVENOUS
  Filled 2019-07-13: qty 100

## 2019-07-13 MED ORDER — PROMETHAZINE HCL 25 MG/ML IJ SOLN: 6.2500 mg | INTRAMUSCULAR | Status: DC | PRN

## 2019-07-13 MED ORDER — ONDANSETRON HCL 4 MG/2ML IJ SOLN: 4.0000 mg | INTRAMUSCULAR | Status: DC | PRN

## 2019-07-13 MED ORDER — BACITRACIN-NEOMYCIN-POLYMYXIN 400-5-5000 EX OINT: 1.0000 "application " | TOPICAL_OINTMENT | Freq: Three times a day (TID) | CUTANEOUS | Status: DC | PRN

## 2019-07-13 MED ORDER — FINASTERIDE 5 MG PO TABS
5.0000 mg | ORAL_TABLET | Freq: Every day | ORAL | Status: DC
  Administered 2019-07-14: 5 mg via ORAL
  Filled 2019-07-13: qty 1

## 2019-07-13 MED ORDER — HYDROCHLOROTHIAZIDE 25 MG PO TABS
12.5000 mg | ORAL_TABLET | Freq: Every day | ORAL | Status: DC
  Administered 2019-07-14: 12.5 mg via ORAL
  Filled 2019-07-13: qty 1

## 2019-07-13 MED ORDER — LIDOCAINE 2% (20 MG/ML) 5 ML SYRINGE
INTRAMUSCULAR | Status: DC | PRN
  Administered 2019-07-13: 100 mg via INTRAVENOUS

## 2019-07-13 MED ORDER — ONDANSETRON HCL 4 MG/2ML IJ SOLN
INTRAMUSCULAR | Status: DC | PRN
  Administered 2019-07-13: 4 mg via INTRAVENOUS

## 2019-07-13 MED ORDER — STERILE WATER FOR IRRIGATION IR SOLN
Status: DC | PRN
  Administered 2019-07-13: 1000 mL

## 2019-07-13 MED ORDER — HYDROMORPHONE HCL 1 MG/ML IJ SOLN
INTRAMUSCULAR | Status: AC
  Administered 2019-07-13: 0.25 mg via INTRAVENOUS
  Filled 2019-07-13: qty 1

## 2019-07-13 MED ORDER — DEXAMETHASONE SODIUM PHOSPHATE 10 MG/ML IJ SOLN
INTRAMUSCULAR | Status: DC | PRN
  Administered 2019-07-13: 10 mg via INTRAVENOUS

## 2019-07-13 MED ORDER — MEPERIDINE HCL 50 MG/ML IJ SOLN: 6.2500 mg | INTRAMUSCULAR | Status: DC | PRN

## 2019-07-13 MED ORDER — PROPOFOL 10 MG/ML IV BOLUS
INTRAVENOUS | Status: AC
  Filled 2019-07-13: qty 20

## 2019-07-13 MED ORDER — ACETAMINOPHEN 160 MG/5ML PO SOLN: 325.0000 mg | Freq: Once | ORAL | Status: DC | PRN

## 2019-07-13 MED ORDER — LACTATED RINGERS IR SOLN
Status: DC | PRN
  Administered 2019-07-13: 1000 mL

## 2019-07-13 MED ORDER — SODIUM CHLORIDE (PF) 0.9 % IJ SOLN
INTRAMUSCULAR | Status: AC
  Filled 2019-07-13: qty 20

## 2019-07-13 MED ORDER — ACETAMINOPHEN 325 MG PO TABS: 325.0000 mg | ORAL_TABLET | Freq: Once | ORAL | Status: DC | PRN

## 2019-07-13 MED ORDER — ONDANSETRON HCL 4 MG/2ML IJ SOLN
INTRAMUSCULAR | Status: AC
  Filled 2019-07-13: qty 2

## 2019-07-13 MED ORDER — CIPROFLOXACIN HCL 250 MG PO TABS: 250.0000 mg | ORAL_TABLET | Freq: Two times a day (BID) | ORAL | 0 refills | Status: DC

## 2019-07-13 MED ORDER — HYDROMORPHONE HCL 1 MG/ML IJ SOLN
0.2500 mg | INTRAMUSCULAR | Status: DC | PRN
  Administered 2019-07-13: 0.5 mg via INTRAVENOUS
  Administered 2019-07-13: 0.25 mg via INTRAVENOUS
  Administered 2019-07-13: 0.5 mg via INTRAVENOUS

## 2019-07-13 MED ORDER — BUPIVACAINE LIPOSOME 1.3 % IJ SUSP
20.0000 mL | Freq: Once | INTRAMUSCULAR | Status: AC
  Administered 2019-07-13: 20 mL
  Filled 2019-07-13: qty 20

## 2019-07-13 MED ORDER — OXYCODONE HCL 5 MG PO TABS: 5.0000 mg | ORAL_TABLET | ORAL | Status: DC | PRN

## 2019-07-13 MED ORDER — CHLORHEXIDINE GLUCONATE 0.12 % MT SOLN
15.0000 mL | Freq: Two times a day (BID) | OROMUCOSAL | Status: DC
  Administered 2019-07-13 – 2019-07-14 (×2): 15 mL via OROMUCOSAL
  Filled 2019-07-13 (×2): qty 15

## 2019-07-13 MED ORDER — LIDOCAINE 2% (20 MG/ML) 5 ML SYRINGE
INTRAMUSCULAR | Status: AC
  Filled 2019-07-13: qty 5

## 2019-07-13 MED ORDER — ORAL CARE MOUTH RINSE
15.0000 mL | Freq: Two times a day (BID) | OROMUCOSAL | Status: DC
  Administered 2019-07-14: 15 mL via OROMUCOSAL

## 2019-07-13 MED ORDER — DIPHENHYDRAMINE HCL 50 MG/ML IJ SOLN: 12.5000 mg | Freq: Four times a day (QID) | INTRAMUSCULAR | Status: DC | PRN

## 2019-07-13 MED ORDER — HYDROMORPHONE HCL 1 MG/ML IJ SOLN
0.5000 mg | INTRAMUSCULAR | Status: DC | PRN
  Administered 2019-07-13 – 2019-07-14 (×3): 1 mg via INTRAVENOUS
  Filled 2019-07-13 (×3): qty 1

## 2019-07-13 MED ORDER — PANTOPRAZOLE SODIUM 40 MG PO TBEC
80.0000 mg | DELAYED_RELEASE_TABLET | Freq: Every day | ORAL | Status: DC
  Administered 2019-07-13: 80 mg via ORAL
  Filled 2019-07-13 (×2): qty 2

## 2019-07-13 MED ORDER — MAGNESIUM CITRATE PO SOLN
1.0000 | Freq: Once | ORAL | Status: DC
  Filled 2019-07-13: qty 296

## 2019-07-13 MED ORDER — PROPOFOL 10 MG/ML IV BOLUS: INTRAVENOUS | Status: DC | PRN

## 2019-07-13 MED ORDER — DIPHENHYDRAMINE HCL 12.5 MG/5ML PO ELIX: 12.5000 mg | ORAL_SOLUTION | Freq: Four times a day (QID) | ORAL | Status: DC | PRN

## 2019-07-13 MED ORDER — POLYVINYL ALCOHOL 1.4 % OP SOLN
1.0000 [drp] | Freq: Every day | OPHTHALMIC | Status: DC | PRN
  Filled 2019-07-13: qty 15

## 2019-07-13 MED ORDER — DEXAMETHASONE SODIUM PHOSPHATE 10 MG/ML IJ SOLN
INTRAMUSCULAR | Status: AC
  Filled 2019-07-13: qty 1

## 2019-07-13 MED ORDER — SODIUM CHLORIDE 0.9 % IR SOLN
3000.0000 mL | Status: DC
  Administered 2019-07-13 – 2019-07-14 (×3): 3000 mL

## 2019-07-13 MED ORDER — PROPOFOL 10 MG/ML IV BOLUS
INTRAVENOUS | Status: DC | PRN
  Administered 2019-07-13: 120 mg via INTRAVENOUS

## 2019-07-13 MED ORDER — HYDROCODONE-ACETAMINOPHEN 5-325 MG PO TABS: 1.0000 | ORAL_TABLET | Freq: Four times a day (QID) | ORAL | 0 refills | Status: DC | PRN

## 2019-07-13 MED ORDER — CIPROFLOXACIN IN D5W 400 MG/200ML IV SOLN
400.0000 mg | Freq: Once | INTRAVENOUS | Status: AC
  Administered 2019-07-13: 400 mg via INTRAVENOUS
  Filled 2019-07-13: qty 200

## 2019-07-13 MED ORDER — AMIODARONE HCL 200 MG PO TABS
100.0000 mg | ORAL_TABLET | Freq: Every day | ORAL | Status: DC
  Administered 2019-07-14: 100 mg via ORAL
  Filled 2019-07-13: qty 1

## 2019-07-13 MED ORDER — FERROUS SULFATE 325 (65 FE) MG PO TABS: 325.0000 mg | ORAL_TABLET | ORAL | Status: DC

## 2019-07-13 MED ORDER — EPHEDRINE SULFATE-NACL 50-0.9 MG/10ML-% IV SOSY
PREFILLED_SYRINGE | INTRAVENOUS | Status: DC | PRN
  Administered 2019-07-13: 10 mg via INTRAVENOUS

## 2019-07-13 MED ORDER — SUFENTANIL CITRATE 50 MCG/ML IV SOLN
INTRAVENOUS | Status: DC | PRN
  Administered 2019-07-13: 10 ug via INTRAVENOUS
  Administered 2019-07-13 (×2): 15 ug via INTRAVENOUS

## 2019-07-13 MED ORDER — HYDROMORPHONE HCL 1 MG/ML IJ SOLN
INTRAMUSCULAR | Status: AC
  Administered 2019-07-13: 0.5 mg via INTRAVENOUS
  Filled 2019-07-13: qty 1

## 2019-07-13 MED ORDER — ROCURONIUM BROMIDE 10 MG/ML (PF) SYRINGE
PREFILLED_SYRINGE | INTRAVENOUS | Status: DC | PRN
  Administered 2019-07-13: 5 mg via INTRAVENOUS
  Administered 2019-07-13: 55 mg via INTRAVENOUS
  Administered 2019-07-13: 20 mg via INTRAVENOUS

## 2019-07-13 MED ORDER — EPHEDRINE 5 MG/ML INJ
INTRAVENOUS | Status: AC
  Filled 2019-07-13: qty 10

## 2019-07-13 MED ORDER — BELLADONNA ALKALOIDS-OPIUM 16.2-60 MG RE SUPP: 1.0000 | Freq: Four times a day (QID) | RECTAL | Status: DC | PRN

## 2019-07-13 MED ORDER — ROCURONIUM BROMIDE 10 MG/ML (PF) SYRINGE
PREFILLED_SYRINGE | INTRAVENOUS | Status: AC
  Filled 2019-07-13: qty 10

## 2019-07-13 MED ORDER — ACETAMINOPHEN 500 MG PO TABS
1000.0000 mg | ORAL_TABLET | Freq: Four times a day (QID) | ORAL | Status: AC
  Administered 2019-07-13 – 2019-07-14 (×4): 1000 mg via ORAL
  Filled 2019-07-13 (×4): qty 2

## 2019-07-13 MED ORDER — ROSUVASTATIN CALCIUM 20 MG PO TABS
20.0000 mg | ORAL_TABLET | Freq: Every day | ORAL | Status: DC
  Administered 2019-07-13: 20 mg via ORAL
  Filled 2019-07-13: qty 1

## 2019-07-13 MED ORDER — SENNOSIDES-DOCUSATE SODIUM 8.6-50 MG PO TABS
1.0000 | ORAL_TABLET | Freq: Two times a day (BID) | ORAL | Status: DC
  Administered 2019-07-13 – 2019-07-14 (×2): 1 via ORAL
  Filled 2019-07-13 (×2): qty 1

## 2019-07-13 SURGICAL SUPPLY — 67 items
APPLICATOR COTTON TIP 6 STRL (MISCELLANEOUS) ×1 IMPLANT
APPLICATOR COTTON TIP 6IN STRL (MISCELLANEOUS) ×2
CATH FOLEY 2WAY SLVR 18FR 30CC (CATHETERS) ×2 IMPLANT
CATH FOLEY 3WAY 30CC 24FR (CATHETERS) ×1
CATH TIEMANN FOLEY 18FR 5CC (CATHETERS) IMPLANT
CATH URTH STD 24FR FL 3W 2 (CATHETERS) ×1 IMPLANT
CHLORAPREP W/TINT 26 (MISCELLANEOUS) ×2 IMPLANT
CLIP VESOLOCK LG 6/CT PURPLE (CLIP) IMPLANT
CLOTH BEACON ORANGE TIMEOUT ST (SAFETY) ×2 IMPLANT
COVER SURGICAL LIGHT HANDLE (MISCELLANEOUS) ×2 IMPLANT
COVER TIP SHEARS 8 DVNC (MISCELLANEOUS) ×1 IMPLANT
COVER TIP SHEARS 8MM DA VINCI (MISCELLANEOUS) ×1
COVER WAND RF STERILE (DRAPES) IMPLANT
CUTTER ECHEON FLEX ENDO 45 340 (ENDOMECHANICALS) IMPLANT
DECANTER SPIKE VIAL GLASS SM (MISCELLANEOUS) ×2 IMPLANT
DERMABOND ADVANCED (GAUZE/BANDAGES/DRESSINGS) ×1
DERMABOND ADVANCED .7 DNX12 (GAUZE/BANDAGES/DRESSINGS) ×1 IMPLANT
DRAIN CHANNEL RND F F (WOUND CARE) IMPLANT
DRAPE ARM DVNC X/XI (DISPOSABLE) ×4 IMPLANT
DRAPE COLUMN DVNC XI (DISPOSABLE) ×1 IMPLANT
DRAPE DA VINCI XI ARM (DISPOSABLE) ×4
DRAPE DA VINCI XI COLUMN (DISPOSABLE) ×1
DRAPE SURG IRRIG POUCH 19X23 (DRAPES) ×2 IMPLANT
DRSG TEGADERM 4X4.75 (GAUZE/BANDAGES/DRESSINGS) ×4 IMPLANT
ELECT REM PT RETURN 15FT ADLT (MISCELLANEOUS) ×2 IMPLANT
GAUZE 4X4 16PLY RFD (DISPOSABLE) IMPLANT
GAUZE SPONGE 2X2 8PLY STRL LF (GAUZE/BANDAGES/DRESSINGS) ×1 IMPLANT
GLOVE BIO SURGEON STRL SZ 6.5 (GLOVE) ×2 IMPLANT
GLOVE BIOGEL M STRL SZ7.5 (GLOVE) ×4 IMPLANT
GLOVE BIOGEL PI IND STRL 7.5 (GLOVE) ×1 IMPLANT
GLOVE BIOGEL PI INDICATOR 7.5 (GLOVE) ×1
GOWN STRL REUS W/TWL LRG LVL3 (GOWN DISPOSABLE) ×6 IMPLANT
HOLDER FOLEY CATH W/STRAP (MISCELLANEOUS) ×2 IMPLANT
IRRIG SUCT STRYKERFLOW 2 WTIP (MISCELLANEOUS) ×2
IRRIGATION SUCT STRKRFLW 2 WTP (MISCELLANEOUS) ×1 IMPLANT
IV LACTATED RINGERS 1000ML (IV SOLUTION) ×2 IMPLANT
KIT TURNOVER KIT A (KITS) IMPLANT
NEEDLE INSUFFLATION 14GA 120MM (NEEDLE) ×2 IMPLANT
PACK ROBOT UROLOGY CUSTOM (CUSTOM PROCEDURE TRAY) ×2 IMPLANT
PAD POSITIONING PINK XL (MISCELLANEOUS) ×2 IMPLANT
PENCIL SMOKE EVACUATOR (MISCELLANEOUS) IMPLANT
PORT ACCESS TROCAR AIRSEAL 12 (TROCAR) ×1 IMPLANT
PORT ACCESS TROCAR AIRSEAL 5M (TROCAR) ×1
SEAL CANN UNIV 5-8 DVNC XI (MISCELLANEOUS) ×4 IMPLANT
SEAL XI 5MM-8MM UNIVERSAL (MISCELLANEOUS) ×4
SET TRI-LUMEN FLTR TB AIRSEAL (TUBING) ×2 IMPLANT
SOLUTION ELECTROLUBE (MISCELLANEOUS) ×2 IMPLANT
SPONGE GAUZE 2X2 STER 10/PKG (GAUZE/BANDAGES/DRESSINGS) ×1
SPONGE LAP 4X18 RFD (DISPOSABLE) ×2 IMPLANT
STAPLE RELOAD 45 GRN (STAPLE) IMPLANT
STAPLE RELOAD 45MM GREEN (STAPLE)
SUT ETHILON 3 0 PS 1 (SUTURE) ×2 IMPLANT
SUT MNCRL AB 4-0 PS2 18 (SUTURE) ×4 IMPLANT
SUT PDS AB 1 CT1 27 (SUTURE) ×4 IMPLANT
SUT V-LOC BARB 180 2/0GR6 GS22 (SUTURE) ×4
SUT VIC AB 0 CT1 27 (SUTURE) ×3
SUT VIC AB 0 CT1 27XBRD ANTBC (SUTURE) ×3 IMPLANT
SUT VIC AB 2-0 SH 27 (SUTURE) ×1
SUT VIC AB 2-0 SH 27X BRD (SUTURE) ×1 IMPLANT
SUT VICRYL 0 UR6 27IN ABS (SUTURE) ×2 IMPLANT
SUT VLOC BARB 180 ABS3/0GR12 (SUTURE) ×4
SUTURE V-LC BRB 180 2/0GR6GS22 (SUTURE) ×2 IMPLANT
SUTURE VLOC BRB 180 ABS3/0GR12 (SUTURE) ×2 IMPLANT
TOWEL OR NON WOVEN STRL DISP B (DISPOSABLE) ×2 IMPLANT
TROCAR BLADELESS OPT 5 100 (ENDOMECHANICALS) IMPLANT
TROCAR XCEL NON-BLD 5MMX100MML (ENDOMECHANICALS) IMPLANT
WATER STERILE IRR 1000ML POUR (IV SOLUTION) ×2 IMPLANT

## 2019-07-13 NOTE — Brief Op Note (Signed)
07/13/2019  1:46 PM  PATIENT:  Philip Jackson  69 y.o. male  PRE-OPERATIVE DIAGNOSIS:  MASSIVE PROSTATE WITH URINARY RETENTION  POST-OPERATIVE DIAGNOSIS:  MASSIVE PROSTATE WITH URINARY RETENTION  PROCEDURE:  Procedure(s) with comments: XI ROBOTIC ASSISTED SIMPLE PROSTATECTOMY (N/A) - 3 HRS  SURGEON:  Surgeon(s) and Role:    Alexis Frock, MD - Primary  PHYSICIAN ASSISTANT:   ASSISTANTS: Debbrah Alar PA   ANESTHESIA:   general  EBL:  150 mL   BLOOD ADMINISTERED:none  DRAINS: 1 - JP to bulb; 2 - Foley to NS irrigation   LOCAL MEDICATIONS USED:  MARCAINE     SPECIMEN:  Source of Specimen:  1 - prostate adenoma  DISPOSITION OF SPECIMEN:  PATHOLOGY  COUNTS:  YES  TOURNIQUET:  * No tourniquets in log *  DICTATION: .Other Dictation: Dictation Number  [id number]  PLAN OF CARE: Admit to inpatient   PATIENT DISPOSITION:  PACU - hemodynamically stable.   Delay start of Pharmacological VTE agent (>24hrs) due to surgical blood loss or risk of bleeding: yes

## 2019-07-13 NOTE — Discharge Instructions (Signed)
1. Activity:  You are encouraged to ambulate frequently (about every hour during waking hours) to help prevent blood clots from forming in your legs or lungs.  However, you should not engage in any heavy lifting (> 10-15 lbs), strenuous activity, or straining. 2. Diet: You should continue a clear liquid diet until passing gas from below.  Once this occurs, you may advance your diet to a soft diet that would be easy to digest (i.e soups, scrambled eggs, mashed potatoes, etc.) for 24 hours just as you would if getting over a bad stomach flu.  If tolerating this diet well for 24 hours, you may then begin eating regular food.  It will be normal to have some amount of bloating, nausea, and abdominal discomfort intermittently. 3. Prescriptions:  You will be provided a prescription for pain medication to take as needed.  If your pain is not severe enough to require the prescription pain medication, you may take Tylenol instead.  You should also take an over the counter stool softener (Colace 100 mg twice daily) to avoid straining with bowel movements as the pain medication may constipate you. Finally, you will also be provided a prescription for an antibiotic to begin the day prior to your return visit in the office for catheter removal. 4. Catheter care: You will be taught how to take care of the catheter by the nursing staff prior to discharge from the hospital.  You may use both a leg bag and the larger bedside bag but it is recommended to at least use the bigger bedside bag at nighttime as the leg bag is small and will fill up overnight and also does not drain as well when lying flat. You may periodically feel a strong urge to void with the catheter in place.  This is a bladder spasm and most often can occur when having a bowel movement or when you are moving around. It is typically self-limited and usually will stop after a few minutes.  You may use some Vaseline or Neosporin around the tip of the catheter to  reduce friction at the tip of the penis. 5. Incisions: You may remove your dressing bandages the 2nd day after surgery.  You most likely will have a few small staples in each of the incisions and once the bandages are removed, the incisions may stay open to air.  You may start showering (not soaking or bathing in water) 48 hours after surgery and the incisions simply need to be patted dry after the shower.  No additional care is needed. 6. What to call us about: You should call the office [phone]) if you develop fever > 101, persistent vomiting, or the catheter stops draining. Also, feel free to call with any other questions you may have and remember the handout that was provided to you as a reference preoperatively which answers many of the common questions that arise after surgery. 7. You may resume aspirin, advil, aleve, vitamins, and supplements 7 days after surgery.  Resume coumadin and Lovenox as instructed by your primary care doctor.

## 2019-07-13 NOTE — Progress Notes (Signed)
Offered CPAP to patient who stated that he brought his own unit from home.  Offered to set close to bed for patient.  Patient refused and stated that he was not going to use CPAP tonight.  Will be available if patient changes his mind.

## 2019-07-13 NOTE — Anesthesia Postprocedure Evaluation (Signed)
Anesthesia Post Note  Patient: AKIR WILMES  Procedure(s) Performed: XI ROBOTIC ASSISTED SIMPLE PROSTATECTOMY (N/A )     Patient location during evaluation: PACU Anesthesia Type: General Level of consciousness: awake and alert Pain management: pain level controlled Vital Signs Assessment: post-procedure vital signs reviewed and stable Respiratory status: spontaneous breathing, nonlabored ventilation, respiratory function stable and patient connected to nasal cannula oxygen Cardiovascular status: blood pressure returned to baseline and stable Postop Assessment: no apparent nausea or vomiting Anesthetic complications: no    Last Vitals:  Vitals:   07/13/19 1545 07/13/19 1600  BP: (!) 148/85 (!) 153/88  Pulse: (!) 57 (!) 58  Resp: 13 13  Temp:  (!) 36.3 C  SpO2: 95% 96%    Last Pain:  Vitals:   07/13/19 1600  PainSc: Asleep                 Effie Berkshire

## 2019-07-13 NOTE — Anesthesia Procedure Notes (Signed)
Procedure Name: Intubation Date/Time: 07/13/2019 11:31 AM Performed by: Sharlette Dense, CRNA Patient Re-evaluated:Patient Re-evaluated prior to induction Oxygen Delivery Method: Circle system utilized Preoxygenation: Pre-oxygenation with 100% oxygen Induction Type: IV induction Ventilation: Mask ventilation without difficulty and Oral airway inserted - appropriate to patient size Laryngoscope Size: Miller and 3 Grade View: Grade I Tube type: Oral Tube size: 8.0 mm Number of attempts: 1 Airway Equipment and Method: Stylet Placement Confirmation: ETT inserted through vocal cords under direct vision,  positive ETCO2 and breath sounds checked- equal and bilateral Secured at: 22 cm Tube secured with: Tape Dental Injury: Teeth and Oropharynx as per pre-operative assessment

## 2019-07-13 NOTE — Anesthesia Preprocedure Evaluation (Addendum)
Anesthesia Evaluation  Patient identified by MRN, date of birth, ID band Patient awake    Reviewed: Allergy & Precautions, NPO status , Patient's Chart, lab work & pertinent test results  Airway Mallampati: II  TM Distance: >3 FB Neck ROM: Full    Dental  (+) Teeth Intact, Dental Advisory Given   Pulmonary sleep apnea and Continuous Positive Airway Pressure Ventilation , former smoker,    breath sounds clear to auscultation       Cardiovascular hypertension, + dysrhythmias Atrial Fibrillation  Rhythm:Regular Rate:Normal     Neuro/Psych negative neurological ROS  negative psych ROS   GI/Hepatic Neg liver ROS, GERD  ,  Endo/Other  negative endocrine ROS  Renal/GU      Musculoskeletal  (+) Arthritis ,   Abdominal Normal abdominal exam  (+)   Peds  Hematology negative hematology ROS (+)   Anesthesia Other Findings   Reproductive/Obstetrics                            Echo:  1. Left ventricular ejection fraction, by visual estimation, is 60 to  65%. The left ventricle has normal function. Normal left ventricular size.  There is mildly increased left ventricular hypertrophy.  2. Left ventricular diastolic Doppler parameters are consistent with  impaired relaxation pattern of LV diastolic filling.  3. Global right ventricle has moderately reduced systolic function.The  right ventricular size is severely enlarged. No increase in right  ventricular wall thickness.  4. Left atrial size was mild-moderately dilated.  5. Right atrial size was normal.  6. The mitral valve is normal in structure. Mild mitral valve  regurgitation. No evidence of mitral stenosis.  7. The tricuspid valve is normal in structure. Tricuspid valve  regurgitation is mild.  8. The aortic valve is normal in structure. Aortic valve regurgitation  was not visualized by color flow Doppler. Structurally normal aortic   valve, with no evidence of sclerosis or stenosis.  9. The pulmonic valve was normal in structure. Pulmonic valve  regurgitation is mild by color flow Doppler.  10. The inferior vena cava is normal in size with greater than 50%  respiratory variability, suggesting right atrial pressure of 3 mmHg.  11. Since the prior study on 12/26/2018 the right ventricle now has new  severe dilatation and moderate dysfunction. RVSP appears normal but can be  underestimated secondary to RV dysfunction.   Anesthesia Physical Anesthesia Plan  ASA: III  Anesthesia Plan: General   Post-op Pain Management:    Induction: Intravenous  PONV Risk Score and Plan: 3 and Ondansetron, Dexamethasone and Midazolam  Airway Management Planned: Oral ETT  Additional Equipment: None  Intra-op Plan:   Post-operative Plan: Extubation in OR  Informed Consent: I have reviewed the patients History and Physical, chart, labs and discussed the procedure including the risks, benefits and alternatives for the proposed anesthesia with the patient or authorized representative who has indicated his/her understanding and acceptance.     Dental advisory given  Plan Discussed with: CRNA  Anesthesia Plan Comments:        Anesthesia Quick Evaluation

## 2019-07-13 NOTE — Op Note (Signed)
NAME: Philip Jackson, Philip Jackson MEDICAL RECORD [id number] ACCOUNT [id number] DATE OF BIRTH:Aug 16, 1950 FACILITY: [facility] LOCATION: [facility] PHYSICIAN:Ephram Kornegay Tresa Moore, MD  OPERATIVE REPORT  DATE OF PROCEDURE:  07/13/2019  SURGEON:  Alexis Frock, MD  PREOPERATIVE DIAGNOSIS:  Enlarged prostate with recurrent urinary retention.  PROCEDURE:  Robotic-assisted laparoscopic simple prostatectomy.  ESTIMATED BLOOD LOSS:  200 mL.  COMPLICATIONS:  None.  SPECIMENS:  Prostate adenoma for pathology.  FINDINGS:  A very large median lobe, orientation prostate.  DRAINS:   1.  Jackson-Pratt drain bulb suction. 2.  Three-way Foley catheter to normal saline irrigation, 20 mL around the balloon.  INDICATIONS:  The patient is a pleasant 69 year old man with a recent somewhat complex history.  He developed urinary retention from known very large prostate and unfortunately also developed some significant hematuria.  He was admitted for acute renal  failure and anemia requiring multiple transfusions.  Unfortunately, during this time, he also developed blood clots requiring anticoagulation.  He was temporized with cystoscopic fulguration, finasteride and after several months of anticoagulation, felt  to be stable to proceed with definitive management for his prostate with simple prostatectomy.  He presents for this today.  He has had a Lovenox bridge as per primary care.  DESCRIPTION OF PROCEDURE:  The patient being identified, the procedure being robotic simple prostatectomy was confirmed.  Procedure timeout was performed.  Intravenous antibiotics administered.  General endotracheal anesthesia induced.  The patient was  placed into a low lithotomy position.  The in situ Foley catheter was removed.  A sterile field was created, prepping and draping his penis, perineum and proximal thighs using iodine and his infra-xiphoid abdomen using chlorhexidine gluconate.  He was  further fastened to the operative table using  3-inch tape over foam padding across the supraxiphoid chest.  A test of steep Trendelenburg positioning was performed and found to be suitably positioned.  Next, high-flow, low-pressure pneumoperitoneum was  obtained using Veress technique in the supraumbilical midline having passed the aspiration and drop test.  Next, an 8 mm robotic camera port was placed in same location.  Laparoscopic examination of the peritoneal cavity revealed no significant  adhesions, no visceral injury.  Distal ports were placed as follows:  Right paramedian 8 mm robotic port, right paramedian 5 mm suction port, right far lateral 12 mm AirSeal assist port, left 8 mm paramedian robotic port, and left far lateral 8 mm  robotic port.  Robot was docked and passed electronic checks.  Initial attention was directed at developing the space of Retzius.  Incision was made lateral to the right medial umbilical ligament from the area of the midline towards the area of the  internal ring, coursing along the iliac vessels towards the area of the right ureter.  The right vas deferens was encountered and not ligated.  The right bladder wall was swept away from the pelvic sidewall towards the area of the endopelvic fascia on  the right side.  A mirror image dissection was performed on the left side.  Anterior attachments were taken down with cautery scissors to expose the anterior base of the prostate, which was defatted to better denote the bladder neck-prostate junction.   Next, the endopelvic fascia was carefully swept away from the anterior surface of the prostate just lateral to the dorsal venous complex just enough to place a green load stapler which controlled the venous complex was excellent hemostasis.  The bladder  neck was then identified and an inverted U-shaped incision was made approximately 2 cm proximal to  the bladder neck junction.  This exposed a very large median lobe prostate adenoma as anticipated.  The ureteral orifices were  visualized.  Four stay  sutures were applied of 2-0 Vicryl in the very large median lobe and 1 each in each lateral lobe.  Attention was directed at posterior dissection.  Incision was made on the posterior surface of the large median lobe approximately 2 cm distal to the  ureteral orifices.  The adenoma plane was entered and then very carefully traced towards the area of the apex of the prostate.  This was then extended laterally and then superiorly from the area of approximately the 4 o'clock to 8 o'clock positions.   Next, using alternating traction of the lateral lobes sutures, this plane of dissection was carried further superiorly all the way to the apex of the prostate using a combination of blunt and sharp robotic scissor dissection and point coagulation  current.  Final apical dissection was performed by incising the anterior aspect along the Foley catheter in a butterfly-type fashion, which allowed better visualization of the apex and  final apical incision of the adenoma.  This completely released the  adenoma which was placed in the EndoCatch bag for later retrieval.  Additional point coagulation current was applied to the prostatic fossa, which resulted in quite good hemostasis.  Next, a double arm 3-0 V-Loc suture was used to perform a mucosal  advancement from the 4 o'clock to the 8 o'clock position, reapproximating the bladder mucosa to the posterior urethral mucosa to further improve hemostasis and prevent undermining of the bladder neck.  Exquisite care was taken to avoid injury to ureteral  orifices, which did not occur.  They remained visibly patent.  The cystotomy was then reapproximated using 2 separate running suture lines of 2-0 V-Loc.  A new 22-French three-way Foley catheter was placed per urethra, 30 mL around the balloon.  This  was irrigated quantitatively.  Sponge and needle counts were correct.  Hemostasis appeared good.  A closed suction drain was brought out the previous left  lateral most robotic port site near the peritoneal cavity.  Robot was undocked.  Specimen was  retrieved by extending the previous camera port site inferiorly for a total distance of approximately 3 cm, removing the prostate adenoma specimens and setting aside for pathology.  Extraction site was closed at the fascia using figure-of-eight PDS x3,  followed by reapproximation of Scarpa's with a running Vicryl.  All incision sites were infiltrated with dilute lipolyzed Marcaine and closed at the level of the skin using subcuticular Monocryl and Dermabond.  Procedure as then terminated.  The patient  tolerated the procedure well.  No immediate perioperative complications.  The patient was taken to postanesthesia care unit in stable condition.  Plan for observation admission and likely discharge home in 1-2 days.  Please note, first assistant Debbrah Alar was crucial for all portions of the procedure today.  She provided invaluable vascular stapling, specimen manipulation, retraction and general first assistance.  VN/NUANCE  D:07/13/2019 T:07/13/2019 JOB:[id number]

## 2019-07-13 NOTE — Transfer of Care (Signed)
Immediate Anesthesia Transfer of Care Note  Patient: JADARRIAN BRUZEK  Procedure(s) Performed: XI ROBOTIC ASSISTED SIMPLE PROSTATECTOMY (N/A )  Patient Location: PACU  Anesthesia Type:General  Level of Consciousness: awake and alert   Airway & Oxygen Therapy: Patient Spontanous Breathing and Patient connected to face mask oxygen  Post-op Assessment: Report given to RN and Post -op Vital signs reviewed and stable  Post vital signs: Reviewed and stable  Last Vitals:  Vitals Value Taken Time  BP 154/91 07/13/19 1402  Temp    Pulse 66 07/13/19 1404  Resp 10 07/13/19 1404  SpO2 97 % 07/13/19 1404  Vitals shown include unvalidated device data.  Last Pain:  Vitals:   07/13/19 0938  PainSc: 0-No pain         Complications: No apparent anesthesia complications
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
## 2019-07-14 LAB — BASIC METABOLIC PANEL
Anion gap: 9 (ref 5–15)
BUN: 24 mg/dL — ABNORMAL HIGH (ref 8–23)
CO2: 23 mmol/L (ref 22–32)
Calcium: 7.9 mg/dL — ABNORMAL LOW (ref 8.9–10.3)
Chloride: 105 mmol/L (ref 98–111)
Creatinine, Ser: 1.08 mg/dL (ref 0.61–1.24)
GFR calc Af Amer: >60 mL/min (ref >60)
GFR calc non Af Amer: >60 mL/min (ref >60)
Glucose, Bld: 169 mg/dL — ABNORMAL HIGH (ref 70–99)
Potassium: 4.7 mmol/L (ref 3.5–5.1)
Sodium: 137 mmol/L (ref 135–145)

## 2019-07-14 LAB — HEMOGLOBIN AND HEMATOCRIT, BLOOD
HCT: 36.2 % — ABNORMAL LOW (ref 39.0–52.0)
Hemoglobin: 11.2 g/dL — ABNORMAL LOW (ref 13.0–17.0)

## 2019-07-14 MED ORDER — PANTOPRAZOLE SODIUM 40 MG PO TBEC: 80.0000 mg | DELAYED_RELEASE_TABLET | Freq: Every day | ORAL | Status: DC

## 2019-07-14 NOTE — Progress Notes (Signed)
Patient discharging home with wife.  AVS and medications reviewed.  MD noted to resume warfarin and lovenox per PCP discretion - patient states his coumadin MD has already given instructions for use after surgery and has them at home.  Patient came in with foley as well and already knowledgeable on how to switch bags and take care of foley.  Reinforced importance of handwashing and catheter care to prevent UTI.  JP drain removed - covered with gauze dressing.  IV removed - WNL.  All questions answered satisfactorily.  Patient assisted off unit in NAD by RN

## 2019-07-14 NOTE — Discharge Summary (Signed)
Physician Discharge Summary  Patient ID: Philip Jackson MRN: [id number] DOB/AGE: Sep 15, 1950 69 y.o.  Admit date: 07/13/2019 Discharge date: 07/14/2019  Admission Diagnoses:  Discharge Diagnoses:  Active Problems:   BPH with obstruction/lower urinary tract symptoms   Discharged Condition: good  Hospital Course: Patient underwent a robotic simple prostatectomy on 07/13/2019.  He had gentle CBI overnight.  The following day, urine was clear.  JP was discontinued and the patient was discharged home with Foley catheter in stable condition.  Consults: None  Significant Diagnostic Studies: None  Treatments: surgery: As above  Discharge Exam: Blood pressure (!) 138/97, pulse 83, temperature 97.9 F (36.6 C), temperature source Oral, resp. rate 18, height 5\' 8"  (1.727 m), weight 92.5 kg, SpO2 94 %. General appearance: alert no acute distress Adequate perfusion of extremities Nonlabored respiration Abdomen soft, appropriately tender, incisions clean dry and intact, nondistended Foley catheter draining clear yellow urine JP serosanguineous  Disposition: Discharge disposition: 01-Home or Self Care        Allergies as of 07/14/2019      Reactions   Bee Pollen Anaphylaxis   Allergic to bees   Nsaids    Duodenal ulcer.  anticoagulated      Medication List    STOP taking these medications   cholecalciferol 25 MCG (1000 UNIT) tablet Commonly known as: VITAMIN D   Fish Oil 1200 MG Caps   Vitamin B-12 5000 MCG Tbdp     TAKE these medications   acetaminophen 500 MG tablet Commonly known as: TYLENOL Take 1,000 mg by mouth every 6 (six) hours as needed for moderate pain.   amiodarone 100 MG tablet Commonly known as: PACERONE Take 1 tablet (100 mg total) by mouth daily.   ciprofloxacin 250 MG tablet Commonly known as: Cipro Take 1 tablet (250 mg total) by mouth 2 (two) times daily. Start day prior to foley removal   clobetasol cream 0.05 % Commonly known as: TEMOVATE Apply  1 application topically 2 (two) times daily as needed (psoriasis (elbows)).   enoxaparin 150 MG/ML injection Commonly known as: Lovenox Inject 1 mL (150 mg total) into the skin daily.   ferrous sulfate 325 (65 FE) MG EC tablet Take 325 mg by mouth 2 (two) times a week.   finasteride 5 MG tablet Commonly known as: PROSCAR Take 1 tablet (5 mg total) by mouth daily.   hydrochlorothiazide 25 MG tablet Commonly known as: HYDRODIURIL Take 12.5 mg by mouth daily.   HYDROcodone-acetaminophen 5-325 MG tablet Commonly known as: Norco Take 1-2 tablets by mouth every 6 (six) hours as needed for moderate pain.   Lubricant Eye Drops 0.4-0.3 % Soln Generic drug: Polyethyl Glycol-Propyl Glycol Place 1 drop into both eyes daily as needed (dry/irritated eyes.).   metoprolol succinate 50 MG 24 hr tablet Commonly known as: TOPROL-XL Take 1 tablet (50 mg total) by mouth daily. Take with or immediately following a meal.   omeprazole 40 MG capsule Commonly known as: PRILOSEC Take 40 mg by mouth at bedtime.   rosuvastatin 20 MG tablet Commonly known as: CRESTOR Take 20 mg by mouth at bedtime.   senna-docusate 8.6-50 MG tablet Commonly known as: Senokot-S Take 1 tablet by mouth 2 (two) times daily. What changed: when to take this   warfarin 2.5 MG tablet Commonly known as: COUMADIN Take as directed. If you are unsure how to take this medication, talk to your nurse or doctor. Original instructions: Take 2 tablets by mouth once daily or as directed. What changed:   how  much to take  how to take this  when to take this  additional instructions      Follow-up Information    Alexis Frock, MD On 07/23/2019.   Specialty: Urology Why: at 9 AM for MD visit and catheter removal.  Contact information: [address] [place] [address] [phone]           Signed: Marton Redwood, III 07/14/2019, 11:45 AM

## 2019-07-16 ENCOUNTER — Other Ambulatory Visit: Payer: Self-pay

## 2019-07-16 LAB — SURGICAL PATHOLOGY

## 2019-07-16 MED ORDER — WARFARIN SODIUM 2.5 MG PO TABS: ORAL_TABLET | ORAL | 1 refills | Status: DC

## 2019-07-18 ENCOUNTER — Telehealth: Payer: Self-pay | Admitting: Internal Medicine

## 2019-07-18 NOTE — Telephone Encounter (Signed)
Updated preferred pharmacy per request

## 2019-07-18 NOTE — Telephone Encounter (Signed)
New message:    Patient calling to up date his pharmarcy [facility].

## 2019-07-19 ENCOUNTER — Other Ambulatory Visit: Payer: Self-pay

## 2019-07-19 ENCOUNTER — Ambulatory Visit (INDEPENDENT_AMBULATORY_CARE_PROVIDER_SITE_OTHER): Payer: BC Managed Care – PPO | Admitting: *Deleted

## 2019-07-19 DIAGNOSIS — Z5181 Encounter for therapeutic drug level monitoring: Secondary | ICD-10-CM

## 2019-07-19 DIAGNOSIS — I480 Paroxysmal atrial fibrillation: Secondary | ICD-10-CM

## 2019-07-19 DIAGNOSIS — I82403 Acute embolism and thrombosis of unspecified deep veins of lower extremity, bilateral: Secondary | ICD-10-CM

## 2019-07-19 LAB — POCT INR: INR: 1.5 — AB (ref 2.0–3.0)

## 2019-07-19 MED ORDER — ENOXAPARIN SODIUM 150 MG/ML ~~LOC~~ SOLN: 150.0000 mg | SUBCUTANEOUS | 0 refills | Status: DC

## 2019-07-19 NOTE — Patient Instructions (Addendum)
Description   Take 3 tablets today and tomorrow, then continue to take 2 tablet daily. Continue lovenox injections 150 mg daily. Recheck INR on Monday 07/23/2019. Call [phone] for any questions, concerns or upcoming procedures.

## 2019-07-23 ENCOUNTER — Ambulatory Visit (INDEPENDENT_AMBULATORY_CARE_PROVIDER_SITE_OTHER): Payer: BC Managed Care – PPO | Admitting: *Deleted

## 2019-07-23 ENCOUNTER — Other Ambulatory Visit: Payer: Self-pay

## 2019-07-23 DIAGNOSIS — Z5181 Encounter for therapeutic drug level monitoring: Secondary | ICD-10-CM

## 2019-07-23 DIAGNOSIS — I82403 Acute embolism and thrombosis of unspecified deep veins of lower extremity, bilateral: Secondary | ICD-10-CM

## 2019-07-23 DIAGNOSIS — I480 Paroxysmal atrial fibrillation: Secondary | ICD-10-CM | POA: Diagnosis not present

## 2019-07-23 LAB — POCT INR: INR: 1.7 — AB (ref 2.0–3.0)

## 2019-07-23 MED ORDER — ENOXAPARIN SODIUM 150 MG/ML ~~LOC~~ SOLN: 150.0000 mg | SUBCUTANEOUS | 1 refills | Status: DC

## 2019-07-23 NOTE — Patient Instructions (Addendum)
Description   Continue Lovenox 150mg  daily until appt. Take 3 tablets today and tomorrow, then continue taking 2 tablet daily. Recheck INR on 07/27/2019. Call [phone] for any questions, concerns or upcoming procedures.
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
## 2019-07-27 ENCOUNTER — Other Ambulatory Visit: Payer: Self-pay

## 2019-07-27 ENCOUNTER — Ambulatory Visit (INDEPENDENT_AMBULATORY_CARE_PROVIDER_SITE_OTHER): Payer: BC Managed Care – PPO | Admitting: Pharmacist

## 2019-07-27 ENCOUNTER — Encounter: Payer: Self-pay | Admitting: Interventional Cardiology

## 2019-07-27 ENCOUNTER — Ambulatory Visit: Payer: BC Managed Care – PPO | Admitting: Interventional Cardiology

## 2019-07-27 VITALS — BP 128/82 | HR 59 | Ht 68.0 in | Wt 199.4 lb

## 2019-07-27 DIAGNOSIS — I480 Paroxysmal atrial fibrillation: Secondary | ICD-10-CM | POA: Diagnosis not present

## 2019-07-27 DIAGNOSIS — Z7189 Other specified counseling: Secondary | ICD-10-CM

## 2019-07-27 DIAGNOSIS — E785 Hyperlipidemia, unspecified: Secondary | ICD-10-CM

## 2019-07-27 DIAGNOSIS — G4733 Obstructive sleep apnea (adult) (pediatric): Secondary | ICD-10-CM | POA: Diagnosis not present

## 2019-07-27 DIAGNOSIS — Z79899 Other long term (current) drug therapy: Secondary | ICD-10-CM

## 2019-07-27 DIAGNOSIS — I2699 Other pulmonary embolism without acute cor pulmonale: Secondary | ICD-10-CM

## 2019-07-27 DIAGNOSIS — I10 Essential (primary) hypertension: Secondary | ICD-10-CM

## 2019-07-27 DIAGNOSIS — Z5181 Encounter for therapeutic drug level monitoring: Secondary | ICD-10-CM

## 2019-07-27 DIAGNOSIS — I82403 Acute embolism and thrombosis of unspecified deep veins of lower extremity, bilateral: Secondary | ICD-10-CM

## 2019-07-27 LAB — POCT INR: INR: 2.5 (ref 2.0–3.0)

## 2019-07-27 NOTE — Patient Instructions (Signed)
Stop Lovenox. Continue taking 2 tablet daily. Recheck INR in 3 weeks. Call [phone] for any questions, concerns or upcoming procedures.

## 2019-07-27 NOTE — Patient Instructions (Signed)

## 2019-08-13 ENCOUNTER — Other Ambulatory Visit: Payer: Self-pay

## 2019-08-13 MED ORDER — WARFARIN SODIUM 2.5 MG PO TABS: ORAL_TABLET | ORAL | 2 refills | Status: DC

## 2019-08-13 NOTE — Telephone Encounter (Signed)
Pt calling requesting his medication be sent to his new pharmacy [facility] pharmacy. Please address

## 2019-08-17 ENCOUNTER — Other Ambulatory Visit: Payer: Self-pay

## 2019-08-17 ENCOUNTER — Ambulatory Visit (INDEPENDENT_AMBULATORY_CARE_PROVIDER_SITE_OTHER): Payer: BC Managed Care – PPO | Admitting: *Deleted

## 2019-08-17 DIAGNOSIS — Z5181 Encounter for therapeutic drug level monitoring: Secondary | ICD-10-CM | POA: Diagnosis not present

## 2019-08-17 DIAGNOSIS — I82403 Acute embolism and thrombosis of unspecified deep veins of lower extremity, bilateral: Secondary | ICD-10-CM

## 2019-08-17 DIAGNOSIS — I480 Paroxysmal atrial fibrillation: Secondary | ICD-10-CM | POA: Diagnosis not present

## 2019-08-17 LAB — POCT INR: INR: 2.6 (ref 2.0–3.0)

## 2019-08-17 NOTE — Patient Instructions (Signed)
Description   Continue taking 2 tablet daily. Recheck INR in 2 weeks. Call [phone] for any questions, concerns or upcoming procedures.

## 2019-08-28 ENCOUNTER — Ambulatory Visit (INDEPENDENT_AMBULATORY_CARE_PROVIDER_SITE_OTHER): Payer: BC Managed Care – PPO | Admitting: *Deleted

## 2019-08-28 ENCOUNTER — Other Ambulatory Visit: Payer: Self-pay

## 2019-08-28 ENCOUNTER — Ambulatory Visit: Payer: BC Managed Care – PPO | Admitting: Internal Medicine

## 2019-08-28 ENCOUNTER — Encounter: Payer: Self-pay | Admitting: Internal Medicine

## 2019-08-28 VITALS — BP 136/76 | HR 49 | Ht 69.0 in | Wt 201.0 lb

## 2019-08-28 DIAGNOSIS — I471 Supraventricular tachycardia: Secondary | ICD-10-CM

## 2019-08-28 DIAGNOSIS — Z5181 Encounter for therapeutic drug level monitoring: Secondary | ICD-10-CM

## 2019-08-28 DIAGNOSIS — I82403 Acute embolism and thrombosis of unspecified deep veins of lower extremity, bilateral: Secondary | ICD-10-CM

## 2019-08-28 DIAGNOSIS — I480 Paroxysmal atrial fibrillation: Secondary | ICD-10-CM

## 2019-08-28 LAB — POCT INR: INR: 2.3 (ref 2.0–3.0)

## 2019-08-28 MED ORDER — METOPROLOL SUCCINATE ER 25 MG PO TB24: 25.0000 mg | ORAL_TABLET | Freq: Every day | ORAL | 3 refills | Status: DC

## 2019-08-28 NOTE — Patient Instructions (Addendum)
Medication Instructions:  Your physician has recommended you make the following change in your medication:   1.   REDUCE your metoprolol succinate ---Take 25 mg by mouth daily  Labwork: None ordered.  Testing/Procedures: None ordered.  Follow-Up: Your physician wants you to follow-up in: one year with Dr. Lovena Le.   You will receive a reminder letter in the mail two months in advance. If you don't receive a letter, please call our office to schedule the follow-up appointment.   Any Other Special Instructions Will Be Listed Below (If Applicable).  If you need a refill on your cardiac medications before your next appointment, please call your pharmacy.

## 2019-08-28 NOTE — Patient Instructions (Signed)
Description   Continue taking 2 tablet daily. Recheck INR in 4 weeks. Call [phone] for any questions, concerns or upcoming procedures.
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
## 2019-09-23 ENCOUNTER — Telehealth: Payer: Self-pay | Admitting: Physician Assistant

## 2019-09-23 DIAGNOSIS — I10 Essential (primary) hypertension: Secondary | ICD-10-CM

## 2019-09-23 NOTE — Telephone Encounter (Signed)
   The patient called the answering service after-hours today. He has a wrist device that shows him his HR periodically and it has been running low 40s at times, like 41. When he is up and moving around it's in the low 60s. He feels fine without acute complaint. BP is stable, sometimes elevated in 150 range. HR was 49 at recent OV, prompting reduction in metoprolol to 25mg  daily. I've asked him to hold metoprolol for now; will route to Dr. Lovena Le and his nurse to review and advised further on medication recs/plan. Warning sx reviewed. The patient verbalized understanding and gratitude.  Charlie Pitter, PA-C

## 2019-09-24 NOTE — Addendum Note (Signed)
Addended by: Willeen Cass A on: 09/24/2019 09:46 AM   Modules accepted: Orders

## 2019-09-24 NOTE — Telephone Encounter (Signed)
Discussed with Dr. Lovena Le.  Per Dr. Sherron Ales Pt stop metoprolol succinate.  Will forward to Dr. Tamala Julian for advisement regarding need for a different blood pressure medication?  Last OV note by Dr. Tamala Julian mentions adding amlodipine.  Will forward to see if Dr. Tamala Julian would like Pt to start amlodipine since Toprol XL has been stopped completely.  Pt aware of above advisement.  Pt indicates understanding.  Aware will call back with additional instruction.

## 2019-09-24 NOTE — Telephone Encounter (Signed)
Agree 

## 2019-09-25 ENCOUNTER — Ambulatory Visit (INDEPENDENT_AMBULATORY_CARE_PROVIDER_SITE_OTHER): Payer: BC Managed Care – PPO | Admitting: *Deleted

## 2019-09-25 ENCOUNTER — Other Ambulatory Visit: Payer: Self-pay

## 2019-09-25 DIAGNOSIS — I82403 Acute embolism and thrombosis of unspecified deep veins of lower extremity, bilateral: Secondary | ICD-10-CM | POA: Diagnosis not present

## 2019-09-25 DIAGNOSIS — I480 Paroxysmal atrial fibrillation: Secondary | ICD-10-CM | POA: Diagnosis not present

## 2019-09-25 DIAGNOSIS — Z5181 Encounter for therapeutic drug level monitoring: Secondary | ICD-10-CM

## 2019-09-25 LAB — POCT INR: INR: 2.6 (ref 2.0–3.0)

## 2019-09-25 MED ORDER — LOSARTAN POTASSIUM 25 MG PO TABS: 25.0000 mg | ORAL_TABLET | Freq: Every day | ORAL | 3 refills | Status: DC

## 2019-09-25 NOTE — Telephone Encounter (Signed)
Spoke with pt and went over recommendations.  Pt agreeable to plan.  He will come 6/16 for labs.  Pt denies taking K+ supplements.

## 2019-09-25 NOTE — Addendum Note (Signed)
Addended by: Loren Racer on: 09/25/2019 12:05 PM   Modules accepted: Orders

## 2019-09-25 NOTE — Telephone Encounter (Signed)
Agree to stopping Metoprolol. Did not see Amlodipine reference. No to amlodipine. Start Losartan 25 mg daily and check BMET in 7-10 days. Make sure he is not on supplemental potassium.

## 2019-09-25 NOTE — Patient Instructions (Signed)
Description   Continue taking 2 tablet daily. Recheck INR in 5 weeks. Call [phone] for any questions, concerns or upcoming procedures.

## 2019-10-03 ENCOUNTER — Other Ambulatory Visit: Payer: Self-pay

## 2019-10-03 ENCOUNTER — Other Ambulatory Visit: Payer: BC Managed Care – PPO | Admitting: *Deleted

## 2019-10-03 DIAGNOSIS — I10 Essential (primary) hypertension: Secondary | ICD-10-CM | POA: Diagnosis not present

## 2019-10-03 LAB — BASIC METABOLIC PANEL
BUN/Creatinine Ratio: 14 (ref 10–24)
BUN: 13 mg/dL (ref 8–27)
CO2: 25 mmol/L (ref 20–29)
Calcium: 9 mg/dL (ref 8.6–10.2)
Chloride: 97 mmol/L (ref 96–106)
Creatinine, Ser: 0.94 mg/dL (ref 0.76–1.27)
GFR calc Af Amer: 96 mL/min/{1.73_m2} (ref >59)
GFR calc non Af Amer: 83 mL/min/{1.73_m2} (ref >59)
Glucose: 109 mg/dL — ABNORMAL HIGH (ref 65–99)
Potassium: 4.1 mmol/L (ref 3.5–5.2)
Sodium: 141 mmol/L (ref 134–144)

## 2019-10-23 DIAGNOSIS — R310 Gross hematuria: Secondary | ICD-10-CM | POA: Diagnosis not present

## 2019-10-23 DIAGNOSIS — R338 Other retention of urine: Secondary | ICD-10-CM | POA: Diagnosis not present

## 2019-10-23 DIAGNOSIS — R972 Elevated prostate specific antigen [PSA]: Secondary | ICD-10-CM | POA: Diagnosis not present

## 2019-10-30 ENCOUNTER — Ambulatory Visit (INDEPENDENT_AMBULATORY_CARE_PROVIDER_SITE_OTHER): Payer: BC Managed Care – PPO | Admitting: *Deleted

## 2019-10-30 ENCOUNTER — Other Ambulatory Visit: Payer: Self-pay

## 2019-10-30 DIAGNOSIS — Z5181 Encounter for therapeutic drug level monitoring: Secondary | ICD-10-CM | POA: Diagnosis not present

## 2019-10-30 DIAGNOSIS — I480 Paroxysmal atrial fibrillation: Secondary | ICD-10-CM | POA: Diagnosis not present

## 2019-10-30 DIAGNOSIS — I82403 Acute embolism and thrombosis of unspecified deep veins of lower extremity, bilateral: Secondary | ICD-10-CM | POA: Diagnosis not present

## 2019-10-30 LAB — POCT INR: INR: 2.5 (ref 2.0–3.0)

## 2019-10-30 NOTE — Patient Instructions (Signed)
Description   Continue taking 2 tablet daily. Recheck INR in 6 weeks. Call [phone] for any questions, concerns or upcoming procedures.

## 2019-10-31 ENCOUNTER — Telehealth: Payer: Self-pay | Admitting: Pharmacist

## 2019-10-31 NOTE — Telephone Encounter (Signed)
Patient called and reported he has a clinic at his work that can test and monitor his INR and wanted to know if that would be allowed.  Asked patient to have his provider there (Dr Laural Benes) fax over a referral for Dr. Tamala Julian to sign to authorize

## 2019-11-02 NOTE — Telephone Encounter (Signed)
Spoke with pt to clarify if Dr. Laural Benes was planning to adjust Coumadin dose as well or just obtain INR.  Pt states Dr. Laural Benes would be doing labs and adjusting medication.  Advised we would not need a form sent to our office then. Advised I will cancel upcoming coumadin appt and to contact the office if he ever decides to have our office monitor his Coumadin again.  Reminded pt of when he is due for a Coumadin check again. He states he is going to see Dr. Laural Benes a little sooner than that and have other labs as well.  Pt appreciative for assistance.

## 2019-11-02 NOTE — Telephone Encounter (Signed)
Noted, thank you

## 2019-11-08 DIAGNOSIS — L814 Other melanin hyperpigmentation: Secondary | ICD-10-CM | POA: Diagnosis not present

## 2019-11-08 DIAGNOSIS — L578 Other skin changes due to chronic exposure to nonionizing radiation: Secondary | ICD-10-CM | POA: Diagnosis not present

## 2019-11-08 DIAGNOSIS — Z85820 Personal history of malignant melanoma of skin: Secondary | ICD-10-CM | POA: Diagnosis not present

## 2019-11-08 DIAGNOSIS — L821 Other seborrheic keratosis: Secondary | ICD-10-CM | POA: Diagnosis not present

## 2019-11-08 DIAGNOSIS — L570 Actinic keratosis: Secondary | ICD-10-CM | POA: Diagnosis not present

## 2019-11-13 DIAGNOSIS — Z20828 Contact with and (suspected) exposure to other viral communicable diseases: Secondary | ICD-10-CM | POA: Diagnosis not present

## 2019-11-15 ENCOUNTER — Other Ambulatory Visit: Payer: Self-pay | Admitting: Internal Medicine

## 2019-11-15 MED ORDER — WARFARIN SODIUM 2.5 MG PO TABS: ORAL_TABLET | ORAL | 0 refills | Status: DC

## 2019-11-15 NOTE — Telephone Encounter (Signed)
*  STAT* If patient is at the pharmacy, call can be transferred to refill team.   1. Which medications need to be refilled? (please list name of each medication and dose if known)  warfarin (COUMADIN) 2.5 MG tablet  2. Which pharmacy/location (including street and city if local pharmacy) is medication to be sent to? [place], [place]  3. Do they need a 30 day or 90 day supply? 30 with refills   Pt is out of medication

## 2019-11-15 NOTE — Telephone Encounter (Signed)
Pt has transferred INR management and monitoring to Dr Laural Benes, who is located at his work. Future refills will need to come from him, but since pt was seen by [place] on 10/30/19 and is not due for recheck by Dr Laural Benes until 12/11/19 will send in a 30 day supply of Warfarin to pharmacy requested. Called pt and notified him rx called into pharmacy as requested, and future refills should come from Dr Laural Benes since she is the one now managing and monitoring pt's INRs.  Pt verbalized understanding.

## 2019-11-26 LAB — PROTIME-INR: INR: 2.1 — AB (ref 0.9–1.1)

## 2019-12-03 LAB — PROTIME-INR

## 2019-12-10 ENCOUNTER — Other Ambulatory Visit: Payer: Self-pay | Admitting: Interventional Cardiology

## 2019-12-10 NOTE — Telephone Encounter (Signed)
Pt is being managed by Dr. Laural Benes; will defer refill to Dr. Laural Benes

## 2019-12-14 ENCOUNTER — Other Ambulatory Visit: Payer: Self-pay | Admitting: Obstetrics and Gynecology

## 2020-01-02 ENCOUNTER — Other Ambulatory Visit: Payer: Self-pay | Admitting: Obstetrics and Gynecology
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
## 2020-01-23 ENCOUNTER — Other Ambulatory Visit: Payer: Self-pay

## 2020-01-23 ENCOUNTER — Encounter: Payer: Self-pay | Admitting: Interventional Cardiology

## 2020-01-23 ENCOUNTER — Ambulatory Visit: Payer: BC Managed Care – PPO | Admitting: Interventional Cardiology

## 2020-01-23 VITALS — BP 142/72 | HR 42 | Ht 69.0 in | Wt 204.0 lb

## 2020-01-23 DIAGNOSIS — G4733 Obstructive sleep apnea (adult) (pediatric): Secondary | ICD-10-CM | POA: Diagnosis not present

## 2020-01-23 DIAGNOSIS — I480 Paroxysmal atrial fibrillation: Secondary | ICD-10-CM | POA: Diagnosis not present

## 2020-01-23 DIAGNOSIS — I10 Essential (primary) hypertension: Secondary | ICD-10-CM | POA: Diagnosis not present

## 2020-01-23 DIAGNOSIS — E785 Hyperlipidemia, unspecified: Secondary | ICD-10-CM

## 2020-01-23 DIAGNOSIS — Z7189 Other specified counseling: Secondary | ICD-10-CM

## 2020-01-23 DIAGNOSIS — Z7901 Long term (current) use of anticoagulants: Secondary | ICD-10-CM

## 2020-01-23 MED ORDER — LOSARTAN POTASSIUM 50 MG PO TABS: 50.0000 mg | ORAL_TABLET | Freq: Every day | ORAL | 3 refills | Status: DC

## 2020-01-23 MED ORDER — AMIODARONE HCL 100 MG PO TABS: 100.0000 mg | ORAL_TABLET | Freq: Every day | ORAL | 3 refills | Status: DC

## 2020-01-23 NOTE — Patient Instructions (Signed)
Medication Instructions:  1) INCREASE Losartan to 50mg  once daily  *If you need a refill on your cardiac medications before your next appointment, please call your pharmacy*   Lab Work: BMET 2 weeks  If you have labs (blood work) drawn today and your tests are completely normal, you will receive your results only by: Marland Kitchen MyChart Message (if you have MyChart) OR . A paper copy in the mail If you have any lab test that is abnormal or we need to change your treatment, we will call you to review the results.   Testing/Procedures: None   Follow-Up:  Your physician recommends that you schedule a follow-up appointment in: 4 weeks with the Hypertension Clinic.  At [facility], you and your health needs are our priority.  As part of our continuing mission to provide you with exceptional heart care, we have created designated Provider Care Teams.  These Care Teams include your primary Cardiologist (physician) and Advanced Practice Providers (APPs -  Physician Assistants and Nurse Practitioners) who all work together to provide you with the care you need, when you need it.  We recommend signing up for the patient portal called "MyChart".  Sign up information is provided on this After Visit Summary.  MyChart is used to connect with patients for Virtual Visits (Telemedicine).  Patients are able to view lab/test results, encounter notes, upcoming appointments, etc.  Non-urgent messages can be sent to your provider as well.   To learn more about what you can do with MyChart, go to [email or web address].    Your next appointment:   12 month(s)  The format for your next appointment:   In Person  Provider:   You may see Sinclair Grooms, MD or one of the following Advanced Practice Providers on your designated Care Team:    Truitt Merle, NP  Cecilie Kicks, NP  Kathyrn Drown, NP    Other Instructions

## 2020-01-28 ENCOUNTER — Other Ambulatory Visit: Payer: Self-pay

## 2020-01-28 MED ORDER — AMIODARONE HCL 100 MG PO TABS: 100.0000 mg | ORAL_TABLET | Freq: Every day | ORAL | 3 refills | Status: DC

## 2020-02-07 ENCOUNTER — Other Ambulatory Visit: Payer: Self-pay

## 2020-02-07 ENCOUNTER — Other Ambulatory Visit: Payer: BC Managed Care – PPO | Admitting: *Deleted

## 2020-02-07 DIAGNOSIS — I10 Essential (primary) hypertension: Secondary | ICD-10-CM

## 2020-02-07 LAB — BASIC METABOLIC PANEL
BUN/Creatinine Ratio: 10 (ref 10–24)
BUN: 11 mg/dL (ref 8–27)
CO2: 25 mmol/L (ref 20–29)
Calcium: 8.8 mg/dL (ref 8.6–10.2)
Chloride: 100 mmol/L (ref 96–106)
Creatinine, Ser: 1.05 mg/dL (ref 0.76–1.27)
GFR calc Af Amer: 83 mL/min/{1.73_m2} (ref >59)
GFR calc non Af Amer: 72 mL/min/{1.73_m2} (ref >59)
Glucose: 107 mg/dL — ABNORMAL HIGH (ref 65–99)
Potassium: 3.8 mmol/L (ref 3.5–5.2)
Sodium: 139 mmol/L (ref 134–144)

## 2020-02-18 ENCOUNTER — Telehealth: Payer: Self-pay | Admitting: *Deleted

## 2020-02-18 ENCOUNTER — Encounter: Payer: Self-pay | Admitting: Gastroenterology

## 2020-02-18 ENCOUNTER — Ambulatory Visit: Payer: BC Managed Care – PPO | Admitting: Gastroenterology

## 2020-02-18 VITALS — BP 126/80 | HR 86 | Ht 68.0 in | Wt 210.0 lb

## 2020-02-18 DIAGNOSIS — Z86010 Personal history of colonic polyps: Secondary | ICD-10-CM | POA: Diagnosis not present

## 2020-02-18 DIAGNOSIS — Z7901 Long term (current) use of anticoagulants: Secondary | ICD-10-CM

## 2020-02-18 DIAGNOSIS — K219 Gastro-esophageal reflux disease without esophagitis: Secondary | ICD-10-CM | POA: Diagnosis not present

## 2020-02-18 DIAGNOSIS — R935 Abnormal findings on diagnostic imaging of other abdominal regions, including retroperitoneum: Secondary | ICD-10-CM | POA: Diagnosis not present

## 2020-02-18 MED ORDER — SUTAB 1479-225-188 MG PO TABS: 1.0000 | ORAL_TABLET | ORAL | 0 refills | Status: DC

## 2020-02-18 NOTE — Telephone Encounter (Signed)
Request for surgical clearance:     Endoscopy Procedure  What type of surgery is being performed?     colonoscopy  When is this surgery scheduled?     03/21/20  What type of clearance is required ?   Pharmacy  Are there any medications that need to be held prior to surgery and how long? Coumadin, 5 days  Practice name and name of physician performing surgery?      [facility]  What is your office phone and fax number?      Phone- [phone]  [phone]  Anesthesia type (None, local, MAC, general) ?       MAC

## 2020-02-18 NOTE — Telephone Encounter (Signed)
Clinical pharmacist to review coumadin 
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
## 2020-02-18 NOTE — Telephone Encounter (Signed)
Patient with diagnosis of a fib/DVT/PE on warfarin for anticoagulation.    Procedure: colonoscopy Date of procedure: 03/21/20   CHA2DS2-VASc Score = 4  This indicates a 4.8% annual risk of stroke. The patient's score is based upon: CHF History: 0 HTN History: 1 Diabetes History: 0 Stroke History: 2 (DVT and PE) Vascular Disease History: 0 Age Score: 1 Gender Score: 0   CrCl 74.23 mL/min  Per office protocol, patient can hold warfarin for 5 days prior to procedure.    Patient WILL need bridging with Lovenox (enoxaparin) around procedure.  However, patient's warfarin and INR are now managed by PCP.  Managing provider should organize Lovenox bridge

## 2020-02-18 NOTE — Telephone Encounter (Signed)
   Primary Cardiologist: Sinclair Grooms, MD  Chart reviewed as part of pre-operative protocol coverage. Patient was contacted 02/18/2020 in reference to pre-operative risk assessment for pending surgery as outlined below.  Philip Jackson was last seen on 01/23/2020 by Dr. Tamala Julian.  Since that day, Philip Jackson has done well without chest pain, DOE, dizziness or feeling of passing out. His heart rate will need to be monitored during the procedure closely given h/o bradycardia.   Therefore, based on ACC/AHA guidelines, the patient would be at acceptable risk for the planned procedure without further cardiovascular testing.   The patient was advised that if he develops new symptoms prior to surgery to contact our office to arrange for a follow-up visit, and he verbalized understanding.  I will route this recommendation to the requesting party via Epic fax function and remove from pre-op pool. Please call with questions.  See recommendation by our clinical pharmacist, recommend PCP who manage the coumadin to handle the lovenox bridge during perioperative period.   [place], [place] 02/18/2020, 1:53 PM

## 2020-02-18 NOTE — Patient Instructions (Addendum)
You have been scheduled for a colonoscopy. Please follow written instructions given to you at your visit today.  Please pick up your prep supplies at the pharmacy within the next 1-3 days. If you use inhalers (even only as needed), please bring them with you on the day of your procedure.  You will be contaced by our office prior to your procedure for directions on holding your Coumadin/Warfarin.  If you do not hear from our office 1 week prior to your scheduled procedure, please call [phone] to discuss.  If you are age 53 or older, your body mass index should be between 23-30. Your Body mass index is 31.93 kg/m. If this is out of the aforementioned range listed, please consider follow up with your Primary Care Provider.  Due to recent changes in healthcare laws, you may see the results of your imaging and laboratory studies on MyChart before your provider has had a chance to review them.  We understand that in some cases there may be results that are confusing or concerning to you. Not all laboratory results come back in the same time frame and the provider may be waiting for multiple results in order to interpret others.  Please give [place] 48 hours in order for your provider to thoroughly review all the results before contacting the office for clarification of your results.

## 2020-02-20 NOTE — Telephone Encounter (Addendum)
Patient sees Dr Geannie Risen @ [facility] for day to day management of his coumadin per recent documentation. As per [facility], patient may hold coumadin 5 days prior to upcoming procedure, but WILL need lovenox bridging. It will be up to Dr Laural Benes to complete lovenox bridging for patient since she maintains his day to day coumadin care. I will send her this correspondence as well as a request that she let me know that this is being addressed.   I have also spoken to the patient to make him aware that Dr Thompson Caul office advises he may hold warfarin but should have lovenox bridging by Dr Laural Benes. Patient verbalizes understanding of this information and states he has also been in contact with Dr Laural Benes.  Dr Geannie Risen  Phone [phone] Fax [phone]

## 2020-02-21 ENCOUNTER — Ambulatory Visit (INDEPENDENT_AMBULATORY_CARE_PROVIDER_SITE_OTHER): Payer: BC Managed Care – PPO | Admitting: Pharmacist

## 2020-02-21 ENCOUNTER — Other Ambulatory Visit: Payer: Self-pay

## 2020-02-21 ENCOUNTER — Telehealth: Payer: Self-pay | Admitting: Interventional Cardiology

## 2020-02-21 VITALS — BP 140/76 | HR 57

## 2020-02-21 DIAGNOSIS — I10 Essential (primary) hypertension: Secondary | ICD-10-CM | POA: Diagnosis not present

## 2020-02-21 MED ORDER — LOSARTAN POTASSIUM 100 MG PO TABS: 100.0000 mg | ORAL_TABLET | Freq: Every day | ORAL | 3 refills | Status: DC

## 2020-02-21 NOTE — Telephone Encounter (Signed)
Information provided to Dr Laural Benes

## 2020-02-21 NOTE — Telephone Encounter (Signed)
Dr Laural Benes called wanting to speak to D.O.D concerning mutual patient of Dr Tamala Julian . Dr Laural Benes spoke To Dr Ellyn Hack. Per Dr Ellyn Hack ,He stated that Dr Laural Benes  Is managing patient 's warfarin . Patient will be having surgery in Dec. 2021.  Dr Laural Benes wanted some assistance in whether the patient needs to be bridge for surgery/colonoscopy. Dr Ellyn Hack informed Dr Laural Benes that the [facility] had anticoagulant clinic that handles the management for the group . Dr Ellyn Hack informed Dr Laural Benes he would contact team to assist  Dr Laural Benes with the decision making for this patient.   forward to [facility] Pharmacist to contact Dr Laural Benes.

## 2020-02-21 NOTE — Telephone Encounter (Signed)
LMOM; Dr Laural Benes to call back   *Bridging instructions per [facility] policy*  JME/26: Last dose of warfarin. Nov/28: No warfarin or enoxaparin (Lovenox). Nov/29: Inject enoxaparin 150mg  in the fatty abdominal tissue at least 2 inches from the belly button every morning (no later than 8 am). No warfarin. Nov/30: Inject enoxaparin in the fatty tissue every morning. No warfarin. Dec/1: Inject enoxaparin in the fatty tissue every morning. No warfarin. Dec/2: Inject enoxaparin in the fatty tissue in the morning . No warfarin. Dec/3: Procedure Day - No enoxaparin - Resume warfarin in the evening or as directed by doctor. Dec/4: Resume enoxaparin inject in the fatty tissue every morning and take warfarin 7.5mg  Dec/5: Inject enoxaparin inject in the fatty tissue every morning and take warfarin 7.5mg  Dec/6:Inject enoxaparin inject in the fatty tissue every morning and take warfarin 5mg  Dec/7: Inject enoxaparin inject in the fatty tissue every morning and take warfarin 5mg  Dec/8: Inject enoxaparin inject in the fatty tissue every morning and take warfarin 5mg  Dec/9: warfarin appt to check INR
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
## 2020-02-21 NOTE — Patient Instructions (Addendum)
It was a pleasure to meet you!  Please increase your losartan to 100mg  daily. You may take 2 of the 50mg  tablets to equal 100mg . I will also call in a new prescription for losartan 100mg  daily (you will only take 1 tablet of the 100mg  tablets)  Continue taking hydrochlorothiazide as you have been. Please bring your medication bottle with you to your next visit.  Call us at [phone] with any questions  Please write down your blood pressures and bring the paper and blood pressure machine with you to your next visit.  Please call your primary care doctor about your wheezing and shortness of breath/mold exposure.  Try to replace your snacks with healthy snacks such as unsalted nuts/fruits/vegetables. Try to limit fries.

## 2020-02-22 ENCOUNTER — Ambulatory Visit: Payer: BC Managed Care – PPO

## 2020-02-22 ENCOUNTER — Encounter: Payer: Self-pay | Admitting: *Deleted

## 2020-02-22 NOTE — Telephone Encounter (Signed)
Dr Geannie Risen has sent a faxed note indicating that she is providing lovenox bridging for patient's procedure (see original under "media" tab in Epic)   In addition, I have spoken to patient who confirms that Dr Laural Benes has spoken with him regarding the lovenox bridge.  We have scheduled endoscopy/colonoscopy for 03/24/20 and cancelled colonoscopy for 03/21/20 so he will not have to come off coumadin and bridge with lovenox for two different procedures (originally we did not have any availabilities for endoscopy/colonoscopy prior to the end of the year and patient wanted to go ahead and get at least one procedure completed).   Patient states he will follow with Dr Laural Benes to adjust dates of lovenox bridge.

## 2020-02-25 NOTE — Telephone Encounter (Signed)
Thanks

## 2020-02-27 NOTE — Telephone Encounter (Signed)
I have spoken to Dr Laural Benes herself who indicates that she is aware patient's procedure date has been moved to 03-24-20 and she has moved his lovenox bridge to reflect this. In addition, she notes that she saw patient today and has drawn labs to insure that all of his levels are within range.

## 2020-03-05 ENCOUNTER — Telehealth: Payer: Self-pay

## 2020-03-05 NOTE — Telephone Encounter (Signed)
Received message from PCP advising Pt is taking HCTZ 25 mg daily.  Medication updated in Pt's chart.
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
## 2020-03-07 ENCOUNTER — Ambulatory Visit (INDEPENDENT_AMBULATORY_CARE_PROVIDER_SITE_OTHER): Payer: BC Managed Care – PPO | Admitting: Pharmacist

## 2020-03-07 ENCOUNTER — Other Ambulatory Visit: Payer: Self-pay

## 2020-03-07 VITALS — BP 144/64 | HR 41

## 2020-03-07 DIAGNOSIS — I10 Essential (primary) hypertension: Secondary | ICD-10-CM | POA: Diagnosis not present

## 2020-03-07 LAB — BASIC METABOLIC PANEL
BUN/Creatinine Ratio: 14 (ref 10–24)
BUN: 18 mg/dL (ref 8–27)
CO2: 25 mmol/L (ref 20–29)
Calcium: 8.8 mg/dL (ref 8.6–10.2)
Chloride: 101 mmol/L (ref 96–106)
Creatinine, Ser: 1.29 mg/dL — ABNORMAL HIGH (ref 0.76–1.27)
GFR calc Af Amer: 65 mL/min/{1.73_m2} (ref >59)
GFR calc non Af Amer: 56 mL/min/{1.73_m2} — ABNORMAL LOW (ref >59)
Glucose: 111 mg/dL — ABNORMAL HIGH (ref 65–99)
Potassium: 4.1 mmol/L (ref 3.5–5.2)
Sodium: 140 mmol/L (ref 134–144)

## 2020-03-07 MED ORDER — AMLODIPINE BESYLATE 2.5 MG PO TABS: 2.5000 mg | ORAL_TABLET | Freq: Every day | ORAL | 11 refills | Status: DC

## 2020-03-07 NOTE — Patient Instructions (Addendum)
It was a pleasure seeing you today!   Start taking amlodipine 2.5 mg daily.   Continue taking losartan 100 mg daily and HCTZ 25 mg daily   Continue to monitor your blood pressure on your left arm with your arm resting on an elevated surface.   Try to eat snacks like carrots, unsalted peanuts or cashews, or greek yogurt with fruit to avoid salt and extra sugar.   Hypertension "High blood pressure"  Hypertension is often called "The Silent Killer." It rarely causes symptoms until it is extremely  high or has done damage to other organs in the body. For this reason, you should have your  blood pressure checked regularly by your physician. We will check your blood pressure  every time you see a provider at one of our offices.   Your blood pressure reading consists of two numbers. Ideally, blood pressure should be  below 120/80. The first ("top") number is called the systolic pressure. It measures the  pressure in your arteries as your heart beats. The second ("bottom") number is called the diastolic pressure. It measures the pressure in your arteries as the heart relaxes between beats.  The benefits of getting your blood pressure under control are enormous. A 10-point  reduction in systolic blood pressure can reduce your risk of stroke by 27% and heart failure by 28%  Your blood pressure goal is <130/80  To check your pressure at home you will need to:  1. Sit up in a chair, with feet flat on the floor and back supported. Do not cross your ankles or legs. 2. Rest your left arm so that the cuff is about heart level. If the cuff goes on your upper arm,  then just relax the arm on the table, arm of the chair or your lap. If you have a wrist cuff, we  suggest relaxing your wrist against your chest (think of it as Pledging the Flag with the  wrong arm).  3. Place the cuff snugly around your arm, about 1 inch above the crook of your elbow. The  cords should be inside the groove of your  elbow.  4. Sit quietly, with the cuff in place, for about 5 minutes. After that 5 minutes press the power  button to start a reading. 5. Do not talk or move while the reading is taking place.  6. Record your readings on a sheet of paper. Although most cuffs have a memory, it is often  easier to see a pattern developing when the numbers are all in front of you.  7. You can repeat the reading after 1-3 minutes if it is recommended  Make sure your bladder is empty and you have not had caffeine or tobacco within the last 30 min  Always bring your blood pressure log with you to your appointments. If you have not brought your monitor in to be double checked for accuracy, please bring it to your next appointment.  You can find a list of validated (accurate) blood pressure cuffs at PopPath.it  Healthy Diet  SALT  What is the big deal with sodium? Why the need to limit our intake? What is the connection to  blood pressure? And what is the difference between salt and sodium? Sodium attracts water. Think about it. When you eat an overly salty snack, you tend to become  thirsty and need more water. If you have too much sodium in your bloodstream, your body will  then pull water into the bloodstream  as well, trying to correct the imbalance. When you have  more volume in the bloodstream, your blood pressure goes up. Your heart must work harder to  pump the extra volume, and the increase in pressure can wear out blood vessels faster. You may  also notice bloating and weight gain. Hypertension is one of the leading risk factors for heart  disease. By limiting sodium intake throughout life you are helping decrease your risk of heart  disease later on.   Salt is made up of two minerals. Sodium and chloride. A teaspoon of salt contains about 40%  sodium and 60% chloride. One teaspoon of salt has 2,300 mg of sodium. While the current  USDA guideline states you should consume no more than 2,300 mg per  day, both they and the  American Heart Association recommend that you limit this to 1,500 mg to stay healthy. Sea salt  or Alpha pink salt may have a slightly different taste, but they still have almost the same  percent of sodium per teaspoon. So feel free to use them instead of table salt, but don't use  more.  A common myth is that if you don't add salt to your food, you are following a low sodium diet.  However, 75% of the sodium consumed in the American diet is from processed foods, NOT the  salt shaker. We all know that chips and crackers are high in sodium, but there are many other  foods that we may not think of when limiting our sodium. Below are the "salty six" foods that  the American Heart Association wants you to be aware of. 1. Cold cuts - even the healthy sliced [place] can have over 1,000 mg of sodium per slice.   Compare different brands to see which has less sodium if you eat these regularly 2. Pizza - depending on your toppings, a slice of pizza can have up to 760 mg of   sodium. Put more veggies on it or just have a slice with a side salad and still enjoy. 3. Soup - yes even that old home remedy of chicken soup is loaded with sodium. Look   for low sodium versions. Or add a bunch of frozen veggies when heating it, this will   give you less sodium per serving 4. Breads - they may not taste salty, but a single slice of bread can have up to 230 mg of   sodium. Toast for breakfast, a sandwich at lunch and a dinner roll can quickly add up   to over 900 mg in just one day.  5. Chicken - some fresh or frozen chicken is injected with a sodium solution before it   reaches the store. A 4 oz serving should have no more than 100 mg sodium. And   watch for breaded frozen chicken nuggets, strips and tenders. They may seem like a   quick and easy "healthy" meal, but they have high amounts of sodium as well. 6. Burritos/tacos - just 2 teaspoons of taco seasoning can have over 400 mg  sodium.   Try making your own with equal parts of cumin, oregano, chili powder and garlic powder.   SUGAR  Sugar is a huge problem in the modern day diet. Sugar is a HUGE contributor to heart disease, diabetes, high triglyceride levels, fatty liver diease and obesity. Sugar is hidden in almost all packaged foods/beverages. It adds no nutritional benefit to your body and can cause major harm. Added sugar is extra  sugar that is added beyond what is naturally found. The American Heart Association recommends limiting added sugars to no more than 25g for women and 36 grams for men per day.  There are many names for sugar maltose, sucrose (names ending in "ose"), high fructose corn syrup, molasses, cane sugar, corn sweetener, raw sugar, syrup, honey or fruit juice concentrate.   One of the best ways to limit your added sugars is to stop drinking sweetened beverages such as soda, sweet tea, fruit juice or fancy coffee's. There is 65g of added sugars in one 20oz bottle of Coke!! That is equal to 6 donuts.   Pay attention and read all nutrition facts labels. Below is an examples of a nutrition facts label. The #1 is showing you the total sugars where the # 2 is showing you the added sugars. This one severing has almost the max amount of added sugars per day!  Watch out for items that say "low fat" or "no added sugar" as these products are typically very high in sugar. The food industry uses these terms to fool you into thinking they are healthy.  For more information on the dangers of sugar watch WHY Sugar is as Bad as Alcohol (Fructose, The Liver Toxin) on YouTube.    EXERCISE  Exercise can help lower your blood pressure ~5 points systolic (top #) and 8 points diastolic (bottom #)  Exercise is good. We've all heard that. In an ideal world, we would all have time and resources to  get plenty of it. When you are active your heart pumps more efficiently and you will feel better.  Multiple studies show  that even walking regularly has benefits that include living a longer life.  The American Heart Association recommends 90-150 minutes per week of exercise (30 minutes  per day most days of the week). You can do this in any increment you wish. Nine or more  10-minute walks count. So does an hour-long exercise class. Break the time apart into what will  work in your life. Some of the best things you can do include walking briskly, jogging, cycling or  swimming laps. Not everyone is ready to "exercise." Sometimes we need to start with just getting active. Here  are some easy ways to be more active throughout the day: Marland Kitchen Take the stairs instead of the elevator . Go for a 10-15 minute walk during your lunch break (find a friend to make it more enjoyable) . When shopping, park at the back of the parking lot . If you take public transportation, get off one stop early and walk the extra distance . Pace around while making phone calls (most of [place] are not attached to phone cords any longer!) Check with your doctor if you aren't sure what your limitations may be. Always remember to drink plenty of water when doing any type of exercise. Don't feel like a failure if you're not getting the 90-150 minutes per week. If you started by being  a couch potato, then just a 10-minute walk each day is a huge improvement. Start with little  victories and work your way up.   Healthy Eating Tips  When looking to improve your eating habits, whether to lose weight, lower blood pressure or just be healthier, it helps to know what a serving size is.   Grains 1 slice of bread,  bagel,  cup pasta or rice  Vegetables 1 cup fresh or raw vegetables,  cup cooked or canned Fruits 1  piece of medium sized fruit,  cup canned,   Meats/Proteins  cup dried       1 oz meat, 1 egg,  cup cooked beans, nuts or seeds  Dairy        Fats Individual yogurt container, 1 cup (8oz)    1 teaspoon margarine/butter or vegetable   milk or milk alternative, 1 slice of cheese          oil; 1 tablespoon mayonnaise or salad dressing                  Plan ahead: make a menu of the meals for a week then create a grocery list to go with  that menu. Consider meals that easily stretch into a night of leftovers, such as stews or  casseroles. Or consider making two of your favorite meal and put one in the freezer for  another night. Try a night or two each week that is "meatless" or "no cook" such as salads.  When you get home from the grocery store wash and prepare your vegetables and fruits.  Then when you need them they are ready to go.  Tips for going to the grocery store: . Buy store or generic brands . Check the weekly ad from your store on-line or in their in-store flyer . Look at the unit price on the shelf tag to compare/contrast the costs of different items . Buy fruits/vegetables in season . Carrots, bananas and apples are low-cost, naturally healthy items . If meats or frozen vegetables are on sale, buy some extras and put in your freezer . Limit buying prepared or "ready to eat" items, even if they are pre-made salads or fruit snacks . Do not shop when you're hungry . Foods at eye level tend to be more expensive. Look on the high and low shelves for deals. . Consider shopping at the farmer's market for fresh foods in season. . Choose canned tuna or salmon instead of fresh . Avoid the cookie and chip aisles (these are expensive, high in calories and low in  nutritional value) Healthy food preparations: . If you can't get lean hamburger, be sure to drain the fat when cooking . Steam, saut (in olive oil), grill or bake foods . Experiment with different seasonings to avoid adding salt to your foods. Kosher salt, sea salt and [place] salt are all still salt and should be avoided         Resources: American Heart Association - InstantFinish.fi Go to the Healthy Living tab to get more  information American Diabetes Association - www.diabetes.org You don't have to be diabetic - check out the Food and Fitness tab  DASH diet - https://wilson-eaton.com/ Health topics - or just search on their home page for DASH Quit for Life - www.cancer.org Follow the Stay Healthy tab to learn more about smoking cessatio

## 2020-03-10 ENCOUNTER — Telehealth: Payer: Self-pay

## 2020-03-10 NOTE — Telephone Encounter (Signed)
Called patient to inform them of their lab results. Patient's Scr slightly increased, <30% increase, will repeat BMET at next appointment. K WNL. Patient asked about their glucose level. I informed him that glucose was steady but this was a random glucose level and we cannot gather a great picture of diabetes control from this. Patient verbalized understanding.

## 2020-03-17 LAB — PROTIME-INR

## 2020-03-19 NOTE — Telephone Encounter (Signed)
Dr. Laural Benes called in requesting to speak with pre op or a pharmacist to confirm the lovenox bridge plan due to a change in the time the procedure will take place. She states it is just a quick question. She can be reached at [phone]  Thank you!

## 2020-03-20 NOTE — Telephone Encounter (Signed)
Talked to Dr Sena Hitch,  Procedure was changed from 10am to 2pm on the same day.   No need to change bridging if the procedure is only 4 hours later than expected.

## 2020-03-20 NOTE — Telephone Encounter (Signed)
Can you please help with his lovenox bridge?

## 2020-03-21 ENCOUNTER — Encounter: Payer: BC Managed Care – PPO | Admitting: Gastroenterology

## 2020-03-24 ENCOUNTER — Ambulatory Visit (AMBULATORY_SURGERY_CENTER): Payer: BC Managed Care – PPO | Admitting: Gastroenterology

## 2020-03-24 ENCOUNTER — Other Ambulatory Visit: Payer: Self-pay

## 2020-03-24 ENCOUNTER — Encounter: Payer: Self-pay | Admitting: Gastroenterology

## 2020-03-24 VITALS — BP 132/78 | HR 50 | Temp 97.1°F | Resp 18 | Ht 68.0 in | Wt 210.0 lb

## 2020-03-24 DIAGNOSIS — D125 Benign neoplasm of sigmoid colon: Secondary | ICD-10-CM | POA: Diagnosis not present

## 2020-03-24 DIAGNOSIS — K449 Diaphragmatic hernia without obstruction or gangrene: Secondary | ICD-10-CM | POA: Diagnosis not present

## 2020-03-24 DIAGNOSIS — K2970 Gastritis, unspecified, without bleeding: Secondary | ICD-10-CM

## 2020-03-24 DIAGNOSIS — K319 Disease of stomach and duodenum, unspecified: Secondary | ICD-10-CM | POA: Diagnosis not present

## 2020-03-24 DIAGNOSIS — K219 Gastro-esophageal reflux disease without esophagitis: Secondary | ICD-10-CM | POA: Diagnosis not present

## 2020-03-24 DIAGNOSIS — Z86010 Personal history of colonic polyps: Secondary | ICD-10-CM

## 2020-03-24 DIAGNOSIS — Z1211 Encounter for screening for malignant neoplasm of colon: Secondary | ICD-10-CM | POA: Diagnosis not present

## 2020-03-24 DIAGNOSIS — D126 Benign neoplasm of colon, unspecified: Secondary | ICD-10-CM

## 2020-03-24 DIAGNOSIS — K3189 Other diseases of stomach and duodenum: Secondary | ICD-10-CM | POA: Diagnosis not present

## 2020-03-24 DIAGNOSIS — R933 Abnormal findings on diagnostic imaging of other parts of digestive tract: Secondary | ICD-10-CM

## 2020-03-24 MED ORDER — SODIUM CHLORIDE 0.9 % IV SOLN: 500.0000 mL | INTRAVENOUS | Status: DC

## 2020-03-24 NOTE — Progress Notes (Signed)
Reviewed patient's medical/surgical history,  States changes since office visit

## 2020-03-24 NOTE — Patient Instructions (Signed)
Information on gastritis, polyps, hemorrhoids and diverticulosis given to you today.  Await pathology results.  Resume Coumadin tonight and Lovenox tomorrow. - Follow up at Coumadin clinic.  Resume previous diet and medications.  YOU HAD AN ENDOSCOPIC PROCEDURE TODAY AT [facility]:   Refer to the procedure report that was given to you for any specific questions about what was found during the examination.  If the procedure report does not answer your questions, please call your gastroenterologist to clarify.  If you requested that your care partner not be given the details of your procedure findings, then the procedure report has been included in a sealed envelope for you to review at your convenience later.  YOU SHOULD EXPECT: Some feelings of bloating in the abdomen. Passage of more gas than usual.  Walking can help get rid of the air that was put into your GI tract during the procedure and reduce the bloating. If you had a lower endoscopy (such as a colonoscopy or flexible sigmoidoscopy) you may notice spotting of blood in your stool or on the toilet paper. If you underwent a bowel prep for your procedure, you may not have a normal bowel movement for a few days.  Please Note:  You might notice some irritation and congestion in your nose or some drainage.  This is from the oxygen used during your procedure.  There is no need for concern and it should clear up in a day or so.  SYMPTOMS TO REPORT IMMEDIATELY:   Following lower endoscopy (colonoscopy or flexible sigmoidoscopy):  Excessive amounts of blood in the stool  Significant tenderness or worsening of abdominal pains  Swelling of the abdomen that is new, acute  Fever of 100F or higher   Following upper endoscopy (EGD)  Vomiting of blood or coffee ground material  New chest pain or pain under the shoulder blades  Painful or persistently difficult swallowing  New shortness of breath  Fever of 100F or  higher  Black, tarry-looking stools  For urgent or emergent issues, a gastroenterologist can be reached at any hour by calling [phone]. Do not use MyChart messaging for urgent concerns.    DIET:  We do recommend a small meal at first, but then you may proceed to your regular diet.  Drink plenty of fluids but you should avoid alcoholic beverages for 24 hours.  ACTIVITY:  You should plan to take it easy for the rest of today and you should NOT DRIVE or use heavy machinery until tomorrow (because of the sedation medicines used during the test).    FOLLOW UP: Our staff will call the number listed on your records 48-72 hours following your procedure to check on you and address any questions or concerns that you may have regarding the information given to you following your procedure. If we do not reach you, we will leave a message.  We will attempt to reach you two times.  During this call, we will ask if you have developed any symptoms of COVID 19. If you develop any symptoms (ie: fever, flu-like symptoms, shortness of breath, cough etc.) before then, please call [phone].  If you test positive for Covid 19 in the 2 weeks post procedure, please call and report this information to [place].    If any biopsies were taken you will be contacted by phone or by letter within the next 1-3 weeks.  Please call us at [phone] if you have not heard about the biopsies in 3  weeks.    SIGNATURES/CONFIDENTIALITY: You and/or your care partner have signed paperwork which will be entered into your electronic medical record.  These signatures attest to the fact that that the information above on your After Visit Summary has been reviewed and is understood.  Full responsibility of the confidentiality of this discharge information lies with you and/or your care-partner.

## 2020-03-24 NOTE — Op Note (Signed)
[facility] Patient Name: Philip Jackson Procedure Date: 03/24/2020 2:40 PM MRN: [id number] Endoscopist: [name] , MD Age: 69 Referring MD:  Date of Birth: 1951-03-07 Gender: Male Account #: [id number] Procedure:                Colonoscopy Indications:              High risk colon cancer surveillance: Personal                            history of colonic polyps (history of multiple                            polyps in 11/2015 including TVA) Medicines:                Monitored Anesthesia Care Procedure:                Pre-Anesthesia Assessment:                           - Prior to the procedure, a History and Physical                            was performed, and patient medications and                            allergies were reviewed. The patient's tolerance of                            previous anesthesia was also reviewed. The risks                            and benefits of the procedure and the sedation                            options and risks were discussed with the patient.                            All questions were answered, and informed consent                            was obtained. Prior Anticoagulants: The patient has                            taken Lovenox (enoxaparin), last dose was 1 day                            prior to procedure. Coumadin last taken 5 days                            prior to the procedure. ASA Grade Assessment: III -                            A patient with severe systemic disease. After  reviewing the risks and benefits, the patient was                            deemed in satisfactory condition to undergo the                            procedure.                           After obtaining informed consent, the colonoscope                            was passed under direct vision. Throughout the                            procedure, the patient's blood pressure, pulse, and                             oxygen saturations were monitored continuously. The                            Colonoscope was introduced through the anus and                            advanced to the the cecum, identified by                            appendiceal orifice and ileocecal valve. The                            colonoscopy was performed without difficulty. The                            patient tolerated the procedure well. The quality                            of the bowel preparation was good. The ileocecal                            valve, appendiceal orifice, and rectum were                            photographed. Scope In: 2:56:58 PM Scope Out: 3:12:30 PM Scope Withdrawal Time: 0 hours 13 minutes 30 seconds  Total Procedure Duration: 0 hours 15 minutes 32 seconds  Findings:                 The perianal and digital rectal examinations were                            normal.                           Multiple medium-mouthed diverticula were found in  the entire colon.                           A 5 to 6 mm polyp was found in the sigmoid colon.                            The polyp was flat. The polyp was removed with a                            cold snare. Resection and retrieval were complete.                           Internal hemorrhoids were found during                            retroflexion. The hemorrhoids were small.                           The exam was otherwise without abnormality. Complications:            No immediate complications. Estimated blood loss:                            Minimal. Estimated Blood Loss:     Estimated blood loss was minimal. Impression:               - Diverticulosis in the entire examined colon.                           - One 5 to 6 mm polyp in the sigmoid colon, removed                            with a cold snare. Resected and retrieved.                           - Internal hemorrhoids.                           - The  examination was otherwise normal. Recommendation:           - Patient has a contact number available for                            emergencies. The signs and symptoms of potential                            delayed complications were discussed with the                            patient. Return to normal activities tomorrow.                            Written discharge instructions were provided to the  patient.                           - Resume previous diet.                           - Continue present medications.                           - Await pathology results.                           - Resume Coumadin tonight                           - Resume Lovenox tomorrow, follow up with coumadin                            clinic or practice managing your coumadin / lovenox                            for instructions on how long to continue lovenox Halyn Flaugher P. Jersee Winiarski, MD 03/24/2020 3:19:20 PM This report has been signed electronically.

## 2020-03-24 NOTE — Op Note (Signed)
[facility] Patient Name: Philip Jackson Procedure Date: 03/24/2020 2:41 PM MRN: [id number] Endoscopist: Remo Lipps P. Nicolis Boody , MD Age: 69 Referring MD:  Date of Birth: [birth year] Gender: Male Account #: [id number] Procedure:                Upper GI endoscopy Indications:              Screening for Barrett's esophagus - longstanding                            GERD, on omeprazole 40mg  / day, Abnormal CT of the                            GI tract - reported ? of contained duodenal                            perforation/ ulceration on CT August 2021, follow                            up CT Sept 2021 showed no evidence of that Medicines:                Monitored Anesthesia Care Procedure:                Pre-Anesthesia Assessment:                           - Prior to the procedure, a History and Physical                            was performed, and patient medications and                            allergies were reviewed. The patient's tolerance of                            previous anesthesia was also reviewed. The risks                            and benefits of the procedure and the sedation                            options and risks were discussed with the patient.                            All questions were answered, and informed consent                            was obtained. Prior Anticoagulants: The patient has                            taken Lovenox (enoxaparin), last dose was 1 day                            prior to procedure. ASA Grade Assessment: III - A  patient with severe systemic disease. After                            reviewing the risks and benefits, the patient was                            deemed in satisfactory condition to undergo the                            procedure.                           After obtaining informed consent, the endoscope was                            passed under direct vision. Throughout the                             procedure, the patient's blood pressure, pulse, and                            oxygen saturations were monitored continuously. The                            Endoscope was introduced through the mouth, and                            advanced to the second part of duodenum. The upper                            GI endoscopy was accomplished without difficulty.                            The patient tolerated the procedure well. Scope In: Scope Out: Findings:                 Esophagogastric landmarks were identified: the                            Z-line was found at 39 cm, the gastroesophageal                            junction was found at 39 cm and the upper extent of                            the gastric folds was found at 40 cm from the                            incisors. Z line very slightly irregular by a few                            mm but did not meet criteria for Barrett's biopsies.  A 1 cm hiatal hernia was present.                           The exam of the esophagus was otherwise normal.                           Patchy mildly erythematous mucosa was found in the                            gastric body.                           The exam of the stomach was otherwise normal.                           Biopsies were taken with a cold forceps in the                            gastric body, at the incisura and in the gastric                            antrum for Helicobacter pylori testing.                           The duodenal bulb and second portion of the                            duodenum were normal. Complications:            No immediate complications. Estimated blood loss:                            Minimal. Estimated Blood Loss:     Estimated blood loss was minimal. Impression:               - Esophagogastric landmarks identified.                           - 1 cm hiatal hernia.                           - No evidence of  Barrett's esophagus                           - Erythematous mucosa in the gastric body -                            biopsies taken to rule out H pylori                           - Normal duodenal bulb and second portion of the                            duodenum.  No abnormality obvious on EGD to correlate to prior                            CT scan findings - no evidence of PUD or scarring                            from prior ulcer. No evidence of Barrett's Recommendation:           - Patient has a contact number available for                            emergencies. The signs and symptoms of potential                            delayed complications were discussed with the                            patient. Return to normal activities tomorrow.                            Written discharge instructions were provided to the                            patient.                           - Resume previous diet.                           - Continue present medications.                           - Await pathology results.                           - Per colonoscopy note, given polyp removed, okay                            to resume Coumadin today and resume Lovenox tomorrow [name], MD 03/24/2020 3:28:26 PM This report has been signed electronically.

## 2020-03-24 NOTE — Progress Notes (Signed)
Report given to PACU, vss 

## 2020-03-24 NOTE — Progress Notes (Signed)
Called to room to assist during endoscopic procedure.  Patient ID and intended procedure confirmed with present staff. Received instructions for my participation in the procedure from the performing physician.  

## 2020-03-26 ENCOUNTER — Telehealth: Payer: Self-pay | Admitting: *Deleted

## 2020-03-26 NOTE — Telephone Encounter (Signed)
  Follow up Call-  Call back number 03/24/2020  Post procedure Call Back phone  # [phone]  Permission to leave phone message Yes  Some recent data might be hidden     Patient questions:  Do you have a fever, pain , or abdominal swelling? No. Pain Score  0 *  Have you tolerated food without any problems? Yes.    Have you been able to return to your normal activities? Yes.    Do you have any questions about your discharge instructions: Diet   No. Medications  No. Follow up visit  No.  Do you have questions or concerns about your Care? No.  Actions: * If pain score is 4 or above: 1. No action needed, pain <4.Have you developed a fever since your procedure? no  2.   Have you had an respiratory symptoms (SOB or cough) since your procedure? no  3.   Have you tested positive for COVID 19 since your procedure no  4.   Have you had any family members/close contacts diagnosed with the COVID 19 since your procedure?  no   If yes to any of these questions please route to Joylene John, RN and Joella Prince, RN

## 2020-04-01 ENCOUNTER — Encounter: Payer: Self-pay | Admitting: Gastroenterology

## 2020-04-03 ENCOUNTER — Other Ambulatory Visit: Payer: Self-pay

## 2020-04-03 ENCOUNTER — Ambulatory Visit (INDEPENDENT_AMBULATORY_CARE_PROVIDER_SITE_OTHER): Payer: BC Managed Care – PPO | Admitting: Pharmacist

## 2020-04-03 VITALS — BP 122/70 | HR 60

## 2020-04-03 DIAGNOSIS — I10 Essential (primary) hypertension: Secondary | ICD-10-CM

## 2020-04-03 LAB — BASIC METABOLIC PANEL
BUN/Creatinine Ratio: 15 (ref 10–24)
BUN: 16 mg/dL (ref 8–27)
CO2: 24 mmol/L (ref 20–29)
Calcium: 9.2 mg/dL (ref 8.6–10.2)
Chloride: 99 mmol/L (ref 96–106)
Creatinine, Ser: 1.06 mg/dL (ref 0.76–1.27)
GFR calc Af Amer: 82 mL/min/{1.73_m2} (ref >59)
GFR calc non Af Amer: 71 mL/min/{1.73_m2} (ref >59)
Glucose: 107 mg/dL — ABNORMAL HIGH (ref 65–99)
Potassium: 4.6 mmol/L (ref 3.5–5.2)
Sodium: 138 mmol/L (ref 134–144)

## 2020-04-03 NOTE — Patient Instructions (Signed)
It was nice to see you again today!  Please continue losartan 100mg  daily, HCTZ 25 mg daily and amlodipine 2.5mg  daily  Continue to check your blood pressure a few times a week.  I will call you in 1 month to check in.  Call me at [phone] with any questions
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
## 2020-06-26 DIAGNOSIS — H2513 Age-related nuclear cataract, bilateral: Secondary | ICD-10-CM | POA: Diagnosis not present

## 2020-06-26 DIAGNOSIS — H25013 Cortical age-related cataract, bilateral: Secondary | ICD-10-CM | POA: Diagnosis not present

## 2020-06-26 DIAGNOSIS — H35033 Hypertensive retinopathy, bilateral: Secondary | ICD-10-CM | POA: Diagnosis not present

## 2020-06-26 DIAGNOSIS — H40013 Open angle with borderline findings, low risk, bilateral: Secondary | ICD-10-CM | POA: Diagnosis not present

## 2020-07-31 DIAGNOSIS — H2511 Age-related nuclear cataract, right eye: Secondary | ICD-10-CM | POA: Diagnosis not present

## 2020-07-31 DIAGNOSIS — H25013 Cortical age-related cataract, bilateral: Secondary | ICD-10-CM | POA: Diagnosis not present

## 2020-07-31 DIAGNOSIS — H2513 Age-related nuclear cataract, bilateral: Secondary | ICD-10-CM | POA: Diagnosis not present

## 2020-07-31 DIAGNOSIS — H40013 Open angle with borderline findings, low risk, bilateral: Secondary | ICD-10-CM | POA: Diagnosis not present

## 2020-08-13 DIAGNOSIS — H52221 Regular astigmatism, right eye: Secondary | ICD-10-CM | POA: Diagnosis not present

## 2020-08-13 DIAGNOSIS — H2511 Age-related nuclear cataract, right eye: Secondary | ICD-10-CM | POA: Diagnosis not present

## 2020-08-13 DIAGNOSIS — H25811 Combined forms of age-related cataract, right eye: Secondary | ICD-10-CM | POA: Diagnosis not present

## 2020-09-16 ENCOUNTER — Other Ambulatory Visit: Payer: Self-pay

## 2020-09-16 ENCOUNTER — Encounter (INDEPENDENT_AMBULATORY_CARE_PROVIDER_SITE_OTHER): Payer: Self-pay

## 2020-09-16 ENCOUNTER — Encounter: Payer: Self-pay | Admitting: Internal Medicine

## 2020-09-16 ENCOUNTER — Ambulatory Visit: Payer: BC Managed Care – PPO | Admitting: Internal Medicine

## 2020-09-16 VITALS — BP 110/64 | HR 99 | Ht 69.0 in | Wt 195.0 lb

## 2020-09-16 DIAGNOSIS — Z79899 Other long term (current) drug therapy: Secondary | ICD-10-CM

## 2020-09-16 DIAGNOSIS — I480 Paroxysmal atrial fibrillation: Secondary | ICD-10-CM

## 2020-09-16 DIAGNOSIS — I495 Sick sinus syndrome: Secondary | ICD-10-CM

## 2020-09-16 DIAGNOSIS — I10 Essential (primary) hypertension: Secondary | ICD-10-CM

## 2020-09-16 LAB — HEPATIC FUNCTION PANEL
ALT: 21 IU/L (ref 0–44)
AST: 21 IU/L (ref 0–40)
Albumin: 4.6 g/dL (ref 3.8–4.8)
Alkaline Phosphatase: 56 IU/L (ref 44–121)
Bilirubin Total: 0.5 mg/dL (ref 0.0–1.2)
Bilirubin, Direct: 0.15 mg/dL (ref 0.00–0.40)
Total Protein: 7 g/dL (ref 6.0–8.5)

## 2020-09-16 LAB — TSH: TSH: 1.49 u[IU]/mL (ref 0.450–4.500)

## 2020-09-16 NOTE — Patient Instructions (Addendum)
Medication Instructions:  Your physician recommends that you continue on your current medications as directed. Please refer to the Current Medication list given to you today.  Labwork: You will get lab work today:  TSH and liver panel  Testing/Procedures: None ordered.  Follow-Up: Your physician wants you to follow-up in: one year with Cristopher Peru, MD or one of the following Advanced Practice Providers on your designated Care Team:    Chanetta Marshall, NP  Tommye Standard, PA-C  Legrand Como "Jonni Sanger" Chalmers Cater, [place]   Any Other Special Instructions Will Be Listed Below (If Applicable).  If you need a refill on your cardiac medications before your next appointment, please call your pharmacy.
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
## 2020-09-22 ENCOUNTER — Telehealth: Payer: Self-pay | Admitting: Internal Medicine

## 2020-09-22 NOTE — Telephone Encounter (Signed)
Evans Lance, MD  09/18/2020 7:35 PM EDT      Labs are normal.    The patient has been notified of the result and verbalized understanding.  All questions (if any) were answered. Darrell Jewel, RN 09/22/2020 8:53 AM

## 2020-09-22 NOTE — Telephone Encounter (Signed)
Patient returning a call to go over lab results. Please call back

## 2020-09-23 IMAGING — CT CT ABD-PELV W/ CM
2 of 5 series · 16 of 46 positions shown, 18 images · IV contrast (omnipaque)
Comparison: 12/03/2018

CLINICAL DATA: Abdominal distention

EXAM:
CT ABDOMEN AND PELVIS WITH CONTRAST
TECHNIQUE: Multidetector CT imaging of the abdomen and pelvis was performed
using the standard protocol following bolus administration of
intravenous contrast.
CONTRAST:  100mL OMNIPAQUE IOHEXOL 300 MG/ML  SOLN

[Series 2: axial st · axial · 0.88mm/px · z∈[+1086,+1491]mm · 13 of 93 slices shown, 15 images]
[im 6/93  soft-tissue]
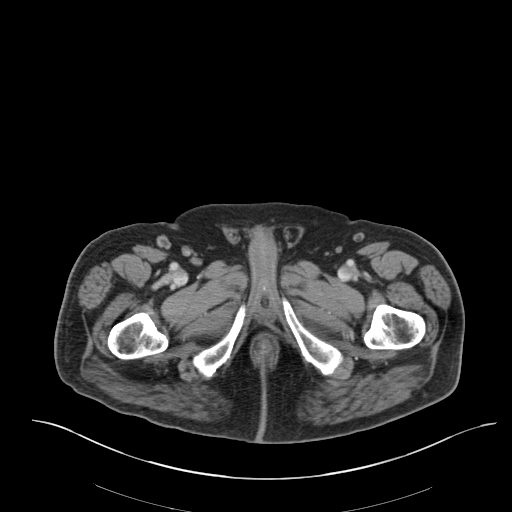
[im 6/93  bone]
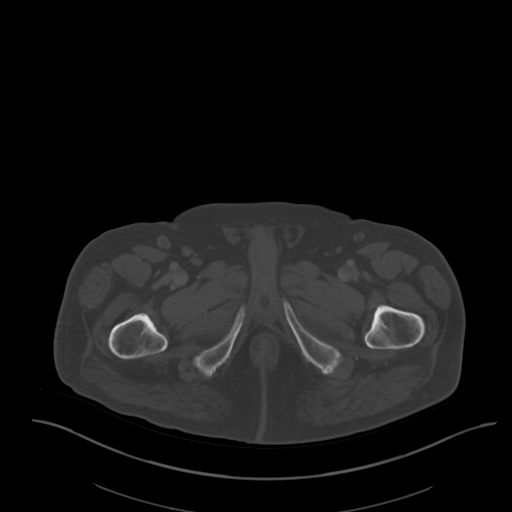
[im 12/93  soft-tissue]
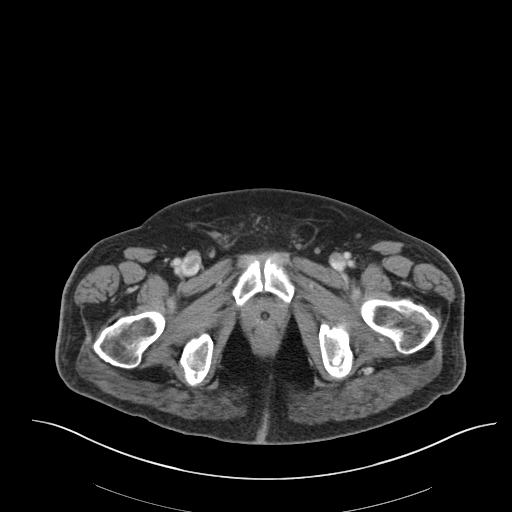
[im 18/93  soft-tissue]
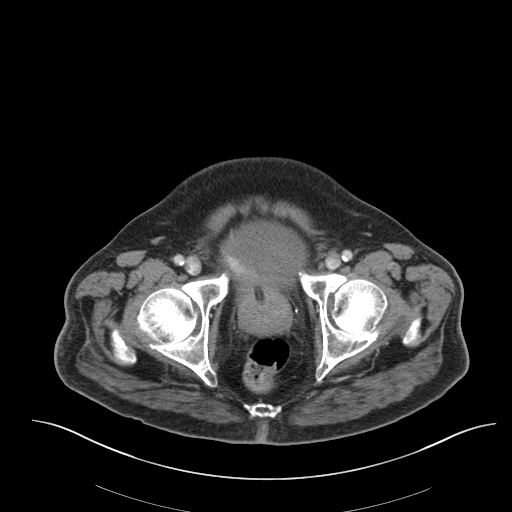
[im 29/93  soft-tissue]
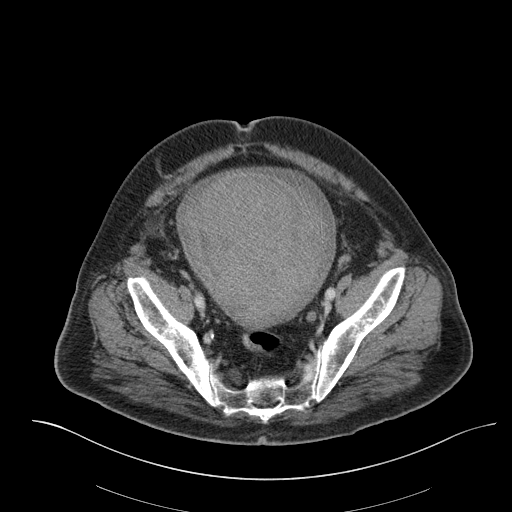
[im 35/93  soft-tissue]
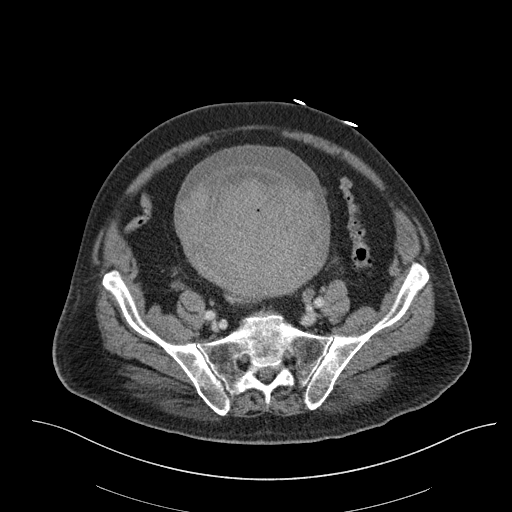
[im 41/93  soft-tissue]
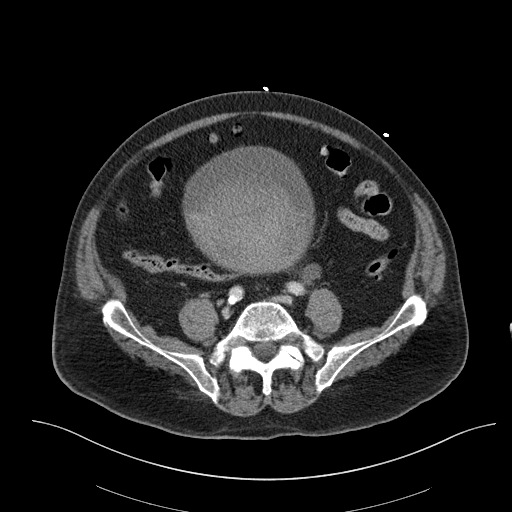
[im 47/93  soft-tissue]
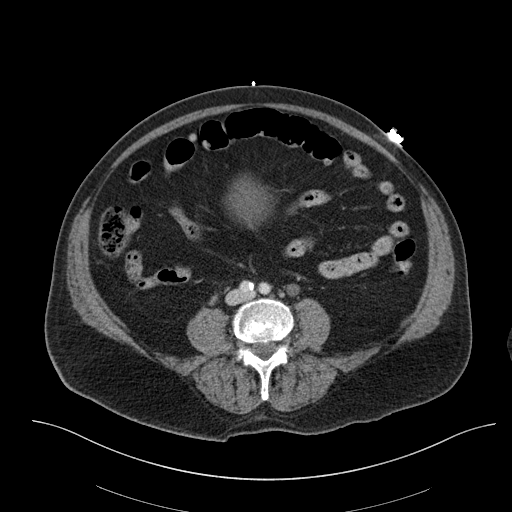
[im 52/93  soft-tissue]
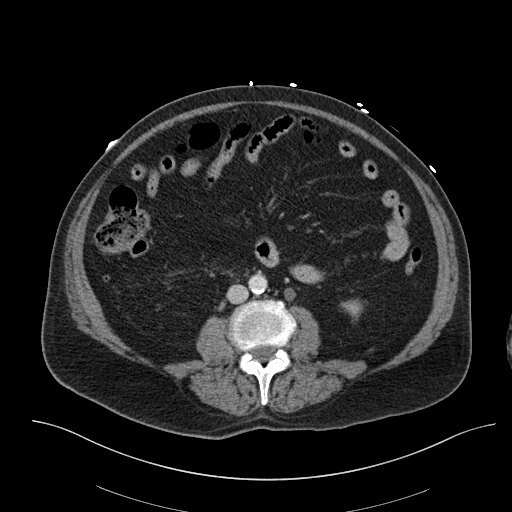
[im 58/93  soft-tissue]
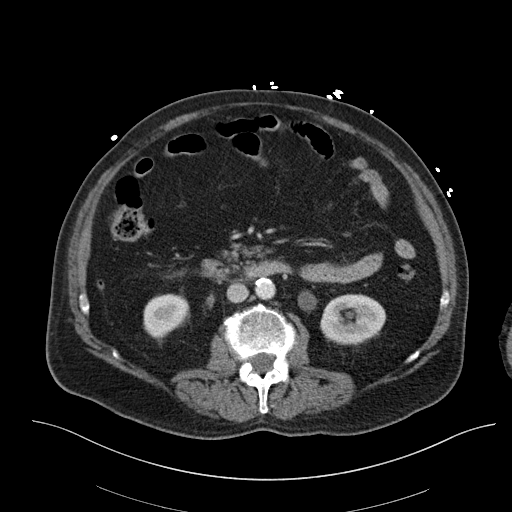
[im 58/93  bone]
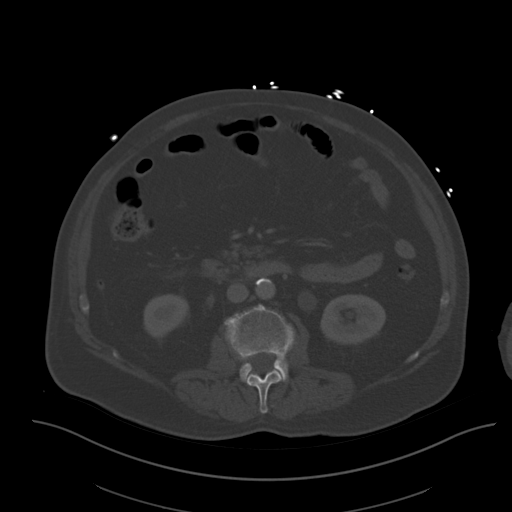
[im 64/93  soft-tissue]
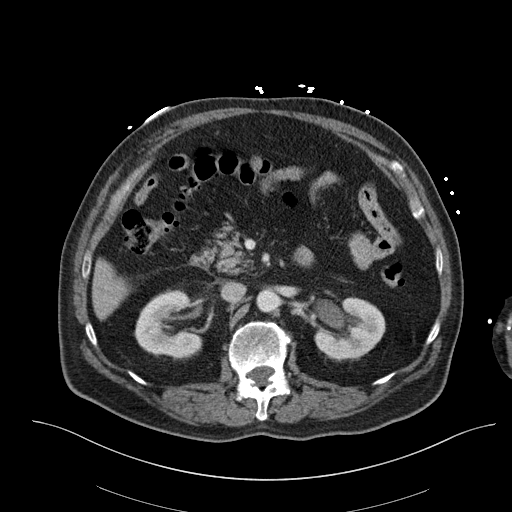
[im 75/93  soft-tissue]
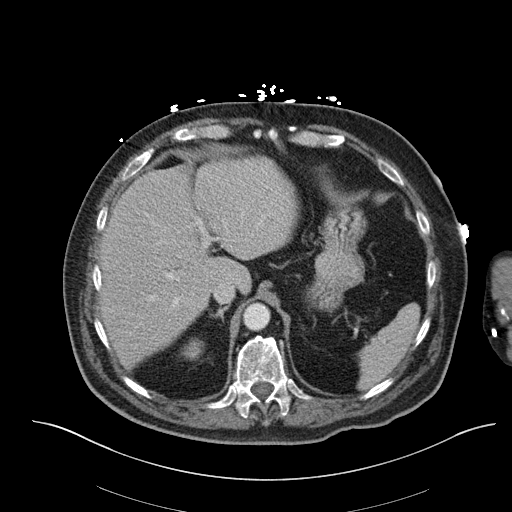
[im 81/93  soft-tissue]
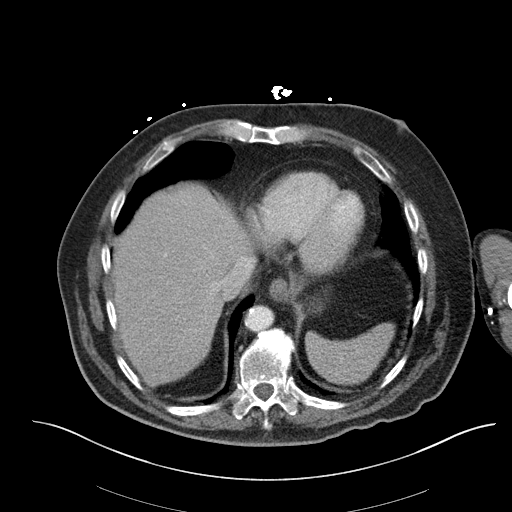
[im 87/93  soft-tissue]
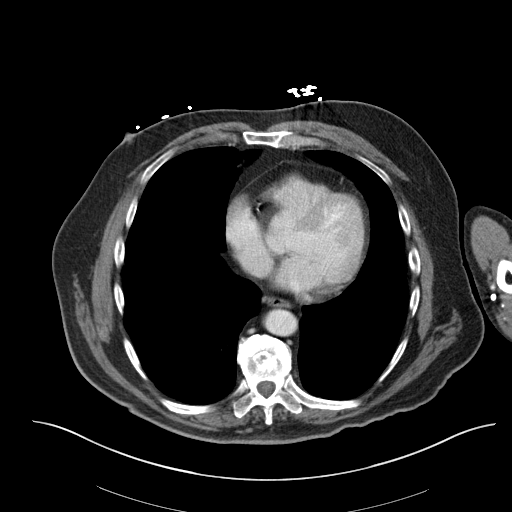

[Series 5: coronal st · coronal · 0.76mm/px · 3 of 161 slices shown]
[im 54/161  soft-tissue]
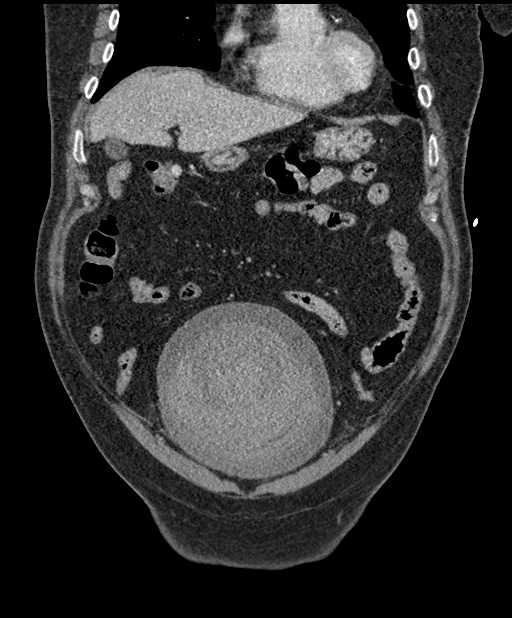
[im 72/161  soft-tissue]
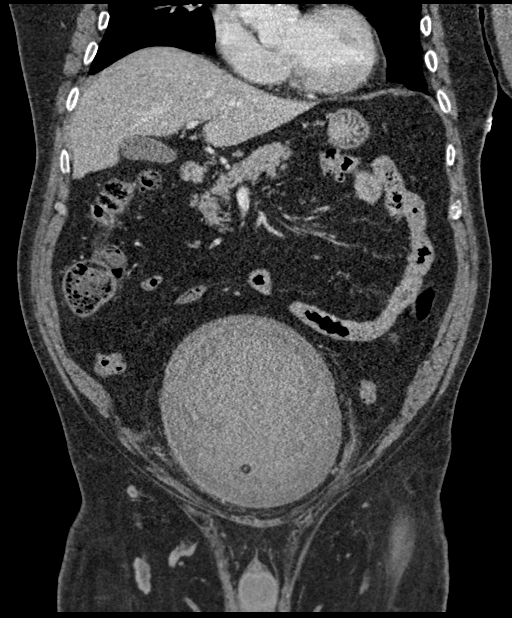
[im 89/161  soft-tissue]
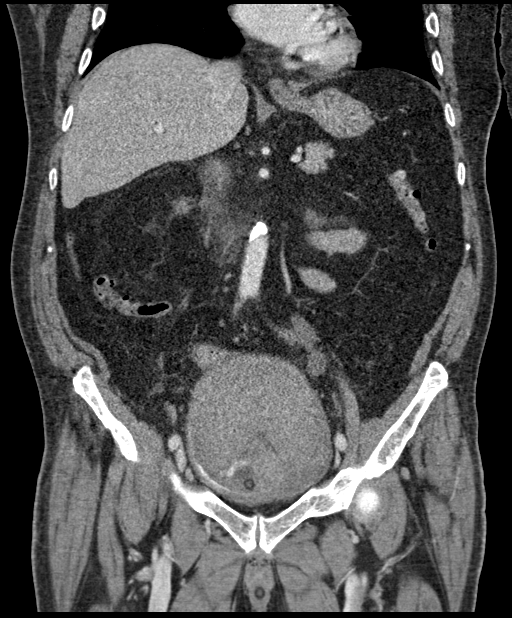

[16 of 46 positions shown; findings below may reference images not displayed]

FINDINGS: Lower chest: Coronary artery calcifications.  No acute abnormality.

Hepatobiliary: No focal hepatic abnormality. Gallbladder
unremarkable.

Pancreas: No focal abnormality or ductal dilatation.

Spleen: No focal abnormality.  Normal size.

Adrenals/Urinary Tract: Mild left hydronephrosis. No hydronephrosis
on the right. Adrenal glands unremarkable. There is a large abnormal
filling defect filling much of the urinary bladder. This could
reflect mass or blood clot. Foley catheter is present within the
bladder.

Stomach/Bowel: Normal appendix. Scattered colonic diverticulosis. No
active diverticulitis. Stomach and small bowel decompressed,
unremarkable.

Vascular/Lymphatic: Aortic atherosclerosis. No enlarged abdominal or
pelvic lymph nodes.

Reproductive: Prostate enlargement.

Other: No free fluid or free air.

Musculoskeletal: No acute bony abnormality.
IMPRESSION: Large irregular filling deflect within the urinary bladder which
could reflect large bladder wall mass or blood clot. There is mild
left hydroureteronephrosis. This could be further evaluated with
cystoscopy.

Aortic atherosclerosis.  Coronary artery disease.

Colonic diverticulosis.

## 2020-09-25 IMAGING — DX DG ABD PORTABLE 1V
1 series · 1 of 1 positions shown · non-contrast
Comparison: CT of the abdomen pelvis dated 12/22/2018.

CLINICAL DATA: 68-year-old male with abdominal distention.

EXAM:
PORTABLE ABDOMEN - 1 VIEW

[abdomen kub]
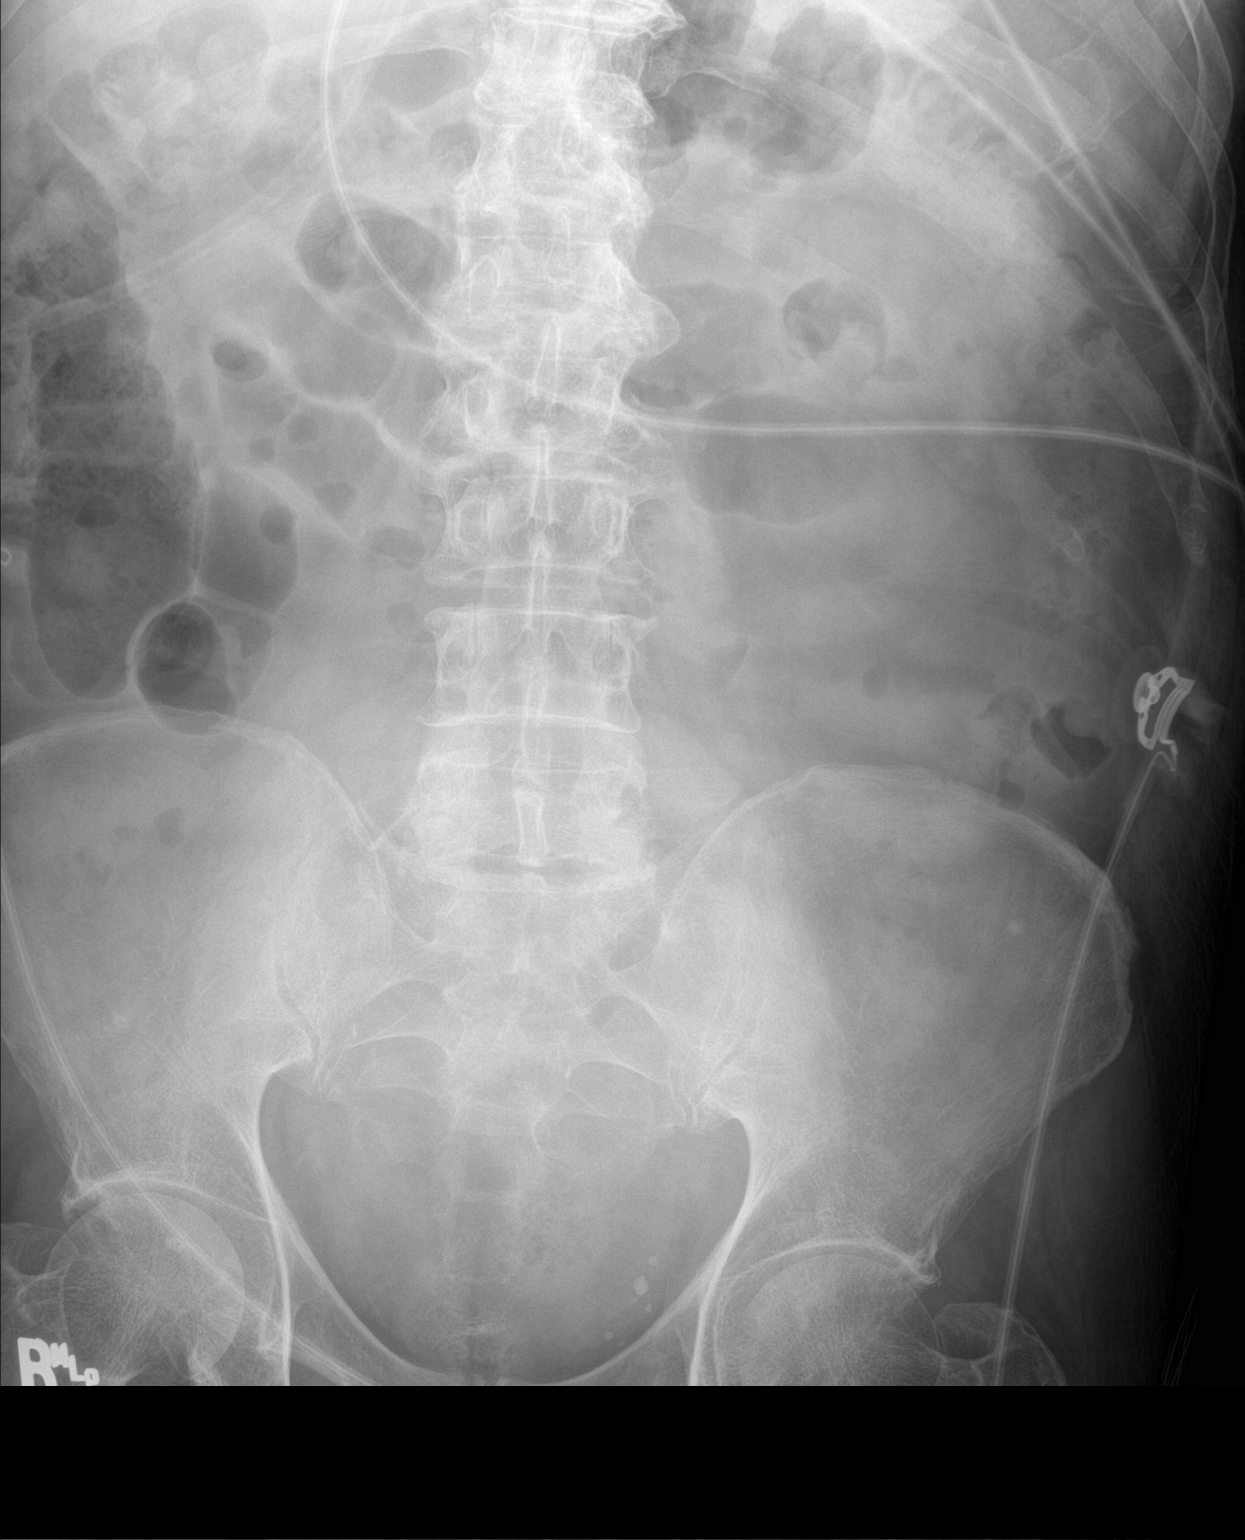

[1 of 1 positions shown; findings below may reference images not displayed]

FINDINGS: Borderline dilated air-filled loops of small bowel in the upper
abdomen may represent mild ileus. Clinical correlation is
recommended. Stool noted throughout the colon. No free air
identified. There is degenerative changes of the spine. Rounded
density over the pelvis most consistent with blood clot full urinary
bladder seen on the recent CT.
IMPRESSION: 1. Borderline dilated air-filled loops of small bowel in the upper
abdomen may represent mild ileus.
2. Density over the pelvis in keeping with blood filled urinary
bladder.

## 2020-09-30 ENCOUNTER — Other Ambulatory Visit: Payer: Self-pay | Admitting: Obstetrics and Gynecology

## 2020-10-23 DIAGNOSIS — R338 Other retention of urine: Secondary | ICD-10-CM | POA: Diagnosis not present

## 2020-10-23 DIAGNOSIS — N400 Enlarged prostate without lower urinary tract symptoms: Secondary | ICD-10-CM | POA: Diagnosis not present

## 2020-10-30 ENCOUNTER — Other Ambulatory Visit: Payer: Self-pay | Admitting: Obstetrics and Gynecology

## 2020-10-30 DIAGNOSIS — I739 Peripheral vascular disease, unspecified: Secondary | ICD-10-CM

## 2020-11-05 ENCOUNTER — Other Ambulatory Visit: Payer: Self-pay | Admitting: Obstetrics and Gynecology

## 2020-11-05 ENCOUNTER — Ambulatory Visit
Admission: RE | Admit: 2020-11-05 | Discharge: 2020-11-05 | Disposition: A | Discharge status: Home / Self Care | Payer: BC Managed Care – PPO | Source: Ambulatory Visit | Attending: Obstetrics and Gynecology | Admitting: Obstetrics and Gynecology

## 2020-11-05 DIAGNOSIS — I10 Essential (primary) hypertension: Secondary | ICD-10-CM | POA: Diagnosis not present

## 2020-11-05 DIAGNOSIS — E785 Hyperlipidemia, unspecified: Secondary | ICD-10-CM | POA: Diagnosis not present

## 2020-11-05 DIAGNOSIS — I739 Peripheral vascular disease, unspecified: Secondary | ICD-10-CM | POA: Diagnosis not present

## 2020-11-05 DIAGNOSIS — I6523 Occlusion and stenosis of bilateral carotid arteries: Secondary | ICD-10-CM | POA: Diagnosis not present

## 2020-11-07 DIAGNOSIS — L570 Actinic keratosis: Secondary | ICD-10-CM | POA: Diagnosis not present

## 2020-11-07 DIAGNOSIS — D2239 Melanocytic nevi of other parts of face: Secondary | ICD-10-CM | POA: Diagnosis not present

## 2020-11-07 DIAGNOSIS — L821 Other seborrheic keratosis: Secondary | ICD-10-CM | POA: Diagnosis not present

## 2020-11-07 DIAGNOSIS — L409 Psoriasis, unspecified: Secondary | ICD-10-CM | POA: Diagnosis not present

## 2020-11-07 DIAGNOSIS — D485 Neoplasm of uncertain behavior of skin: Secondary | ICD-10-CM | POA: Diagnosis not present

## 2020-11-07 DIAGNOSIS — Z85828 Personal history of other malignant neoplasm of skin: Secondary | ICD-10-CM | POA: Diagnosis not present

## 2020-11-07 DIAGNOSIS — L578 Other skin changes due to chronic exposure to nonionizing radiation: Secondary | ICD-10-CM | POA: Diagnosis not present

## 2021-01-22 DIAGNOSIS — H524 Presbyopia: Secondary | ICD-10-CM | POA: Diagnosis not present

## 2021-01-22 DIAGNOSIS — H35033 Hypertensive retinopathy, bilateral: Secondary | ICD-10-CM | POA: Diagnosis not present

## 2021-01-22 DIAGNOSIS — H40013 Open angle with borderline findings, low risk, bilateral: Secondary | ICD-10-CM | POA: Diagnosis not present

## 2021-01-22 DIAGNOSIS — H25012 Cortical age-related cataract, left eye: Secondary | ICD-10-CM | POA: Diagnosis not present

## 2021-01-22 DIAGNOSIS — H2512 Age-related nuclear cataract, left eye: Secondary | ICD-10-CM | POA: Diagnosis not present

## 2021-03-23 ENCOUNTER — Telehealth: Payer: Self-pay | Admitting: Interventional Cardiology

## 2021-03-23 MED ORDER — AMIODARONE HCL 100 MG PO TABS: 100.0000 mg | ORAL_TABLET | Freq: Every day | ORAL | 0 refills | Status: DC

## 2021-03-23 NOTE — Telephone Encounter (Signed)
Pt's medication was sent to pt's pharmacy as requested. Confirmation received.  °

## 2021-03-23 NOTE — Telephone Encounter (Signed)
*  STAT* If patient is at the pharmacy, call can be transferred to refill team.   1. Which medications need to be refilled? (please list name of each medication and dose if known) amiodarone (PACERONE) 100 MG tablet  2. Which pharmacy/location (including street and city if local pharmacy) is medication to be sent to? [place]  3. Do they need a 30 day or 90 day supply? [place]

## 2021-04-14 ENCOUNTER — Telehealth: Payer: Self-pay | Admitting: Interventional Cardiology

## 2021-04-14 NOTE — Telephone Encounter (Signed)
FYI-This pt does not have INR's managed or monitored by our [facility].  Per telephone note 10/31/19 pt was switching to Dr Zenaida Deed office for INR monitoring and management.

## 2021-04-14 NOTE — Telephone Encounter (Signed)
Called and left message for [facility] - Dr Zenaida Deed office monitors pt's INRs and they should contact that office to report his recent INR.

## 2021-04-14 NOTE — Telephone Encounter (Signed)
[facility] was calling in to let us know that the patient INR has drop to 1.8. his warfarin dosgae 5mg  Tuesday-Sunday and Monday 6.25ng Please advise
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
## 2021-04-22 ENCOUNTER — Encounter: Payer: Self-pay | Admitting: Interventional Cardiology

## 2021-04-22 ENCOUNTER — Other Ambulatory Visit: Payer: Self-pay

## 2021-04-22 ENCOUNTER — Ambulatory Visit: Payer: BC Managed Care – PPO | Admitting: Interventional Cardiology

## 2021-04-22 ENCOUNTER — Encounter (INDEPENDENT_AMBULATORY_CARE_PROVIDER_SITE_OTHER): Payer: Self-pay

## 2021-04-22 VITALS — BP 124/74 | HR 81 | Ht 68.0 in | Wt 201.0 lb

## 2021-04-22 DIAGNOSIS — I480 Paroxysmal atrial fibrillation: Secondary | ICD-10-CM

## 2021-04-22 DIAGNOSIS — Z79899 Other long term (current) drug therapy: Secondary | ICD-10-CM

## 2021-04-22 DIAGNOSIS — G4733 Obstructive sleep apnea (adult) (pediatric): Secondary | ICD-10-CM

## 2021-04-22 DIAGNOSIS — I10 Essential (primary) hypertension: Secondary | ICD-10-CM | POA: Diagnosis not present

## 2021-04-22 DIAGNOSIS — Z7901 Long term (current) use of anticoagulants: Secondary | ICD-10-CM

## 2021-04-22 DIAGNOSIS — E785 Hyperlipidemia, unspecified: Secondary | ICD-10-CM | POA: Diagnosis not present

## 2021-04-22 MED ORDER — AMLODIPINE BESYLATE 2.5 MG PO TABS: 2.5000 mg | ORAL_TABLET | Freq: Every day | ORAL | 3 refills | Status: DC

## 2021-04-22 MED ORDER — AMIODARONE HCL 100 MG PO TABS: 100.0000 mg | ORAL_TABLET | Freq: Every day | ORAL | 0 refills | Status: DC

## 2021-04-22 MED ORDER — HYDROCHLOROTHIAZIDE 25 MG PO TABS: 25.0000 mg | ORAL_TABLET | Freq: Every day | ORAL | 3 refills | Status: DC

## 2021-04-22 MED ORDER — ROSUVASTATIN CALCIUM 20 MG PO TABS: 20.0000 mg | ORAL_TABLET | Freq: Every day | ORAL | 3 refills | Status: DC

## 2021-04-22 MED ORDER — LOSARTAN POTASSIUM 100 MG PO TABS: 100.0000 mg | ORAL_TABLET | Freq: Every day | ORAL | 3 refills | Status: DC

## 2021-04-22 NOTE — Patient Instructions (Signed)
Medication Instructions:  ?Your physician recommends that you continue on your current medications as directed. Please refer to the Current Medication list given to you today. ? ?*If you need a refill on your cardiac medications before your next appointment, please call your pharmacy* ? ? ?Lab Work: ?None ?If you have labs (blood work) drawn today and your tests are completely normal, you will receive your results only by: ?MyChart Message (if you have MyChart) OR ?A paper copy in the mail ?If you have any lab test that is abnormal or we need to change your treatment, we will call you to review the results. ? ? ?Testing/Procedures: ?None ? ? ?Follow-Up: ?At [facility], you and your health needs are our priority.  As part of our continuing mission to provide you with exceptional heart care, we have created designated Provider Care Teams.  These Care Teams include your primary Cardiologist (physician) and Advanced Practice Providers (APPs -  Physician Assistants and Nurse Practitioners) who all work together to provide you with the care you need, when you need it. ? ?We recommend signing up for the patient portal called "MyChart".  Sign up information is provided on this After Visit Summary.  MyChart is used to connect with patients for Virtual Visits (Telemedicine).  Patients are able to view lab/test results, encounter notes, upcoming appointments, etc.  Non-urgent messages can be sent to your provider as well.   ?To learn more about what you can do with MyChart, go to [email or web address].   ? ?Your next appointment:   ?6 month(s) ? ?The format for your next appointment:   ?In Person ? ?Provider:   ?Henry W Smith III, MD  ? ? ?Other Instructions ?  ?

## 2021-05-21 ENCOUNTER — Telehealth: Payer: Self-pay | Admitting: Interventional Cardiology

## 2021-05-21 NOTE — Telephone Encounter (Signed)
Patient reports his FitBit has alerted him twice that his heart rate has dropped to the 40s while he was sitting down. He states that heart rate will go back up when he stands up and moves around. Patient states his current heart rate at time of call was 58, and that it ranges 50-81 per his FitBit. Patient states his blood pressure yesterday was 127/67. Patient denies any chest pain, denies SOB, denies dizziness. Patient reports taking all medications as prescribed, no missed doses. Asked patient if he felt like he was in a-fib, patient states "No."   Will send to Dr. Lovena Le and his RN for review.

## 2021-05-21 NOTE — Telephone Encounter (Signed)
STAT if HR is under 50 or over 120 (normal HR is 60-100 beats per minute)  What is your heart rate?  40 at rest high 50's-60's any other time.   Do you have a log of your heart rate readings (document readings)? No   Do you have any other symptoms? No    Believes it's been 3 weeks since he noticed this starting. Would like a callback to discuss possible medication adjustments to relay to the patient. Was not with the patient at the time of call. Contact number is her cell, states she will step out of a room with a patient to answer callback if necessary.

## 2021-05-21 NOTE — Telephone Encounter (Signed)
Patient reports his FitBit has alerted him twice that his heart rate has dropped to the 40s while he was sitting down. He states that heart rate will go back up when he stands up and moves around. Patient states his current heart rate at time of call was 58, and that it ranges 50-81 per his FitBit. Patient states his blood pressure yesterday was 127/67. Patient denies any chest pain, denies SOB, denies dizziness. Patient reports taking all medications as prescribed, no missed doses. Asked patient if he felt like he was in a-fib, patient states "No."   Will send to Dr. Lovena Le for review.

## 2021-05-21 NOTE — Telephone Encounter (Signed)
Returned call to [place].  Pt is asymptomatic with lower heart rates.  Heart rates are low at rest only and come up as appropriate with exercise.  Langley Gauss will call and reassure Pt that low heart rates are ok as long as he remains asymptomatic.

## 2021-06-26 DIAGNOSIS — H25812 Combined forms of age-related cataract, left eye: Secondary | ICD-10-CM | POA: Diagnosis not present

## 2021-06-26 DIAGNOSIS — H40013 Open angle with borderline findings, low risk, bilateral: Secondary | ICD-10-CM | POA: Diagnosis not present

## 2021-09-28 ENCOUNTER — Other Ambulatory Visit: Payer: Self-pay | Admitting: Interventional Cardiology

## 2021-09-29 ENCOUNTER — Telehealth: Payer: Self-pay | Admitting: Interventional Cardiology

## 2021-09-29 ENCOUNTER — Other Ambulatory Visit: Payer: Self-pay

## 2021-09-29 MED ORDER — AMIODARONE HCL 100 MG PO TABS: 100.0000 mg | ORAL_TABLET | Freq: Every day | ORAL | 2 refills | Status: DC

## 2021-09-29 NOTE — Telephone Encounter (Signed)
Returned call to patient. Patient reports HR was 150 earlier on his Fitbit but is now 108. BP 125/78. No CP or SOB. Reports feeling light headed earlier when HR was in 150s.  Scheduled with DOD for 6/14 for acute visit.
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
## 2021-09-29 NOTE — Telephone Encounter (Signed)
Pt's medication was sent to pt's pharmacy as requested. Confirmation received.  °

## 2021-09-29 NOTE — Telephone Encounter (Signed)
  Patient c/o Palpitations:  High priority if patient c/o lightheadedness, shortness of breath, or chest pain  How long have you had palpitations/irregular HR/ Afib? Are you having the symptoms now? Elevated HR  Are you currently experiencing lightheadedness, SOB or CP? A little lightheadedness   Do you have a history of afib (atrial fibrillation) or irregular heart rhythm? yes  Have you checked your BP or HR? (document readings if available): 150 HR  Are you experiencing any other symptoms? Pt said, he's been experiencing elevated HR and afib, he said, he gets a little lightheaded when his in Afib. He would like to see Dr. Tamala Julian sooner. He said, he is driving and if he unable to answer call to leave him a detailed message

## 2021-09-30 ENCOUNTER — Encounter: Payer: Self-pay | Admitting: Interventional Cardiology

## 2021-09-30 ENCOUNTER — Ambulatory Visit: Payer: BC Managed Care – PPO | Admitting: Interventional Cardiology

## 2021-09-30 VITALS — BP 112/76 | HR 75 | Ht 68.0 in | Wt 199.8 lb

## 2021-09-30 DIAGNOSIS — I10 Essential (primary) hypertension: Secondary | ICD-10-CM

## 2021-09-30 DIAGNOSIS — I495 Sick sinus syndrome: Secondary | ICD-10-CM

## 2021-09-30 DIAGNOSIS — I480 Paroxysmal atrial fibrillation: Secondary | ICD-10-CM

## 2021-09-30 DIAGNOSIS — G4733 Obstructive sleep apnea (adult) (pediatric): Secondary | ICD-10-CM

## 2021-09-30 DIAGNOSIS — Z7901 Long term (current) use of anticoagulants: Secondary | ICD-10-CM

## 2021-09-30 DIAGNOSIS — E785 Hyperlipidemia, unspecified: Secondary | ICD-10-CM

## 2021-09-30 DIAGNOSIS — Z79899 Other long term (current) drug therapy: Secondary | ICD-10-CM

## 2021-09-30 MED ORDER — METOPROLOL SUCCINATE ER 25 MG PO TB24: 12.5000 mg | ORAL_TABLET | Freq: Every day | ORAL | 3 refills | Status: DC

## 2021-09-30 NOTE — Patient Instructions (Signed)
Medication Instructions:  Your physician has recommended you make the following change in your medication:   1) START Metoprolol succinate (Toprol XL) 12.'5mg'$  every morning  *If you need a refill on your cardiac medications before your next appointment, please call your pharmacy*   Lab Work: NONE  Testing/Procedures: NONE  Follow-Up: At [facility], you and your health needs are our priority.  As part of our continuing mission to provide you with exceptional heart care, we have created designated Provider Care Teams.  These Care Teams include your primary Cardiologist (physician) and Advanced Practice Providers (APPs -  Physician Assistants and Nurse Practitioners) who all work together to provide you with the care you need, when you need it.  Your next appointment:   3 week(s) **keep appointment with Dr. Tamala Julian on 10/23/21 at 09:00 AM**  The format for your next appointment:   In Person  Provider:   Sinclair Grooms, MD {   Important Information About Sugar
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
## 2021-10-23 ENCOUNTER — Encounter: Payer: Self-pay | Admitting: Interventional Cardiology

## 2021-10-23 ENCOUNTER — Ambulatory Visit: Payer: BC Managed Care – PPO | Admitting: Interventional Cardiology

## 2021-10-23 VITALS — BP 118/68 | HR 64 | Ht 68.0 in | Wt 202.6 lb

## 2021-10-23 DIAGNOSIS — I480 Paroxysmal atrial fibrillation: Secondary | ICD-10-CM

## 2021-10-23 DIAGNOSIS — E785 Hyperlipidemia, unspecified: Secondary | ICD-10-CM | POA: Diagnosis not present

## 2021-10-23 DIAGNOSIS — Z7901 Long term (current) use of anticoagulants: Secondary | ICD-10-CM

## 2021-10-23 DIAGNOSIS — I10 Essential (primary) hypertension: Secondary | ICD-10-CM

## 2021-10-23 DIAGNOSIS — G4733 Obstructive sleep apnea (adult) (pediatric): Secondary | ICD-10-CM

## 2021-10-23 DIAGNOSIS — Z79899 Other long term (current) drug therapy: Secondary | ICD-10-CM

## 2021-10-23 DIAGNOSIS — Z1329 Encounter for screening for other suspected endocrine disorder: Secondary | ICD-10-CM

## 2021-10-23 MED ORDER — APIXABAN 5 MG PO TABS: 5.0000 mg | ORAL_TABLET | Freq: Two times a day (BID) | ORAL | 11 refills | Status: DC

## 2021-10-23 NOTE — Patient Instructions (Addendum)
Medication Instructions:  Your physician has recommended you make the following change in your medication:   1) STOP Warfarin (Coumadin) 2) START Eliquis '5mg'$  twice daily (start taking this on Monday October 26, 2021)  *If you need a refill on your cardiac medications before your next appointment, please call your pharmacy*  Lab Work: In 1 month: CBC, CMET, TSH If you have labs (blood work) drawn today and your tests are completely normal, you will receive your results only by: [place] (if you have MyChart) OR A paper copy in the mail If you have any lab test that is abnormal or we need to change your treatment, we will call you to review the results.  Testing/Procedures: NONE  Follow-Up: At [facility], you and your health needs are our priority.  As part of our continuing mission to provide you with exceptional heart care, we have created designated Provider Care Teams.  These Care Teams include your primary Cardiologist (physician) and Advanced Practice Providers (APPs -  Physician Assistants and Nurse Practitioners) who all work together to provide you with the care you need, when you need it.  Your next appointment:   6 month(s)  The format for your next appointment:   In Person  Provider:   Sinclair Grooms, MD {  Important Information About Sugar

## 2021-11-09 DIAGNOSIS — L814 Other melanin hyperpigmentation: Secondary | ICD-10-CM | POA: Diagnosis not present

## 2021-11-09 DIAGNOSIS — L578 Other skin changes due to chronic exposure to nonionizing radiation: Secondary | ICD-10-CM | POA: Diagnosis not present

## 2021-11-09 DIAGNOSIS — L409 Psoriasis, unspecified: Secondary | ICD-10-CM | POA: Diagnosis not present

## 2021-11-09 DIAGNOSIS — L570 Actinic keratosis: Secondary | ICD-10-CM | POA: Diagnosis not present

## 2021-11-09 DIAGNOSIS — L821 Other seborrheic keratosis: Secondary | ICD-10-CM | POA: Diagnosis not present

## 2021-11-10 DIAGNOSIS — R338 Other retention of urine: Secondary | ICD-10-CM | POA: Diagnosis not present

## 2021-11-23 ENCOUNTER — Other Ambulatory Visit: Payer: BC Managed Care – PPO

## 2021-11-23 DIAGNOSIS — I10 Essential (primary) hypertension: Secondary | ICD-10-CM

## 2021-11-23 DIAGNOSIS — I480 Paroxysmal atrial fibrillation: Secondary | ICD-10-CM

## 2021-11-23 DIAGNOSIS — Z7901 Long term (current) use of anticoagulants: Secondary | ICD-10-CM | POA: Diagnosis not present

## 2021-11-23 DIAGNOSIS — Z1329 Encounter for screening for other suspected endocrine disorder: Secondary | ICD-10-CM

## 2021-11-24 LAB — TSH: TSH: 0.547 u[IU]/mL (ref 0.450–4.500)

## 2021-11-24 LAB — COMPREHENSIVE METABOLIC PANEL
ALT: 21 IU/L (ref 0–44)
AST: 16 IU/L (ref 0–40)
Albumin/Globulin Ratio: 2 (ref 1.2–2.2)
Albumin: 4.3 g/dL (ref 3.8–4.8)
Alkaline Phosphatase: 51 IU/L (ref 44–121)
BUN/Creatinine Ratio: 18 (ref 10–24)
BUN: 24 mg/dL (ref 8–27)
Bilirubin Total: 0.4 mg/dL (ref 0.0–1.2)
CO2: 24 mmol/L (ref 20–29)
Calcium: 9 mg/dL (ref 8.6–10.2)
Chloride: 102 mmol/L (ref 96–106)
Creatinine, Ser: 1.32 mg/dL — ABNORMAL HIGH (ref 0.76–1.27)
Globulin, Total: 2.2 g/dL (ref 1.5–4.5)
Glucose: 107 mg/dL — ABNORMAL HIGH (ref 70–99)
Potassium: 4 mmol/L (ref 3.5–5.2)
Sodium: 139 mmol/L (ref 134–144)
Total Protein: 6.5 g/dL (ref 6.0–8.5)
eGFR: 58 mL/min/{1.73_m2} — ABNORMAL LOW (ref >59)

## 2021-11-24 LAB — CBC
Hematocrit: 44 % (ref 37.5–51.0)
Hemoglobin: 14.6 g/dL (ref 13.0–17.7)
MCH: 29.6 pg (ref 26.6–33.0)
MCHC: 33.2 g/dL (ref 31.5–35.7)
MCV: 89 fL (ref 79–97)
Platelets: 308 10*3/uL (ref 150–450)
RBC: 4.93 x10E6/uL (ref 4.14–5.80)
RDW: 13.1 % (ref 11.6–15.4)
WBC: 8.7 10*3/uL (ref 3.4–10.8)

## 2022-01-26 DIAGNOSIS — H04123 Dry eye syndrome of bilateral lacrimal glands: Secondary | ICD-10-CM | POA: Diagnosis not present

## 2022-01-26 DIAGNOSIS — H02831 Dermatochalasis of right upper eyelid: Secondary | ICD-10-CM | POA: Diagnosis not present

## 2022-01-26 DIAGNOSIS — H524 Presbyopia: Secondary | ICD-10-CM | POA: Diagnosis not present

## 2022-01-26 DIAGNOSIS — H25812 Combined forms of age-related cataract, left eye: Secondary | ICD-10-CM | POA: Diagnosis not present

## 2022-01-26 DIAGNOSIS — H40013 Open angle with borderline findings, low risk, bilateral: Secondary | ICD-10-CM | POA: Diagnosis not present

## 2022-02-03 ENCOUNTER — Telehealth: Payer: Self-pay | Admitting: Interventional Cardiology

## 2022-02-03 NOTE — Telephone Encounter (Signed)
Spoke with pt who states he has noticed his heart rate has been higher with exercise (90's-lower 100's) since starting Eliquis.  He is not certain if this has anything to do with elevated HR.  He denies current CP, SOB or dizziness.  He states he is usually unaware of when he is Afib so he is not certain if he has had more recently.  He is taking medication as prescribed.   Pt advised he is past due follow up with Dr Lovena Le.  Appointment scheduled with Oda Kilts, PA-C for 03/08/2022.  Will forward to Dr Lovena Le and his RN to make them aware.

## 2022-02-03 NOTE — Telephone Encounter (Signed)
STAT if HR is under 50 or over 120 (normal HR is 60-100 beats per minute)  What is your heart rate?  10/18: 113 (currently while walking)   Do you have a log of your heart rate readings (document readings)?    Do you have any other symptoms?  No, but patient states HR has been more elevated than usual recently.

## 2022-02-11 NOTE — Telephone Encounter (Signed)
It has nothing to do with eliquis.

## 2022-02-16 ENCOUNTER — Encounter: Payer: Self-pay | Admitting: Cardiology

## 2022-02-16 ENCOUNTER — Ambulatory Visit: Payer: BC Managed Care – PPO | Attending: Cardiology | Admitting: Cardiology

## 2022-02-16 ENCOUNTER — Telehealth: Payer: Self-pay | Admitting: Interventional Cardiology

## 2022-02-16 VITALS — BP 108/72 | HR 71 | Ht 68.5 in | Wt 193.0 lb

## 2022-02-16 DIAGNOSIS — I480 Paroxysmal atrial fibrillation: Secondary | ICD-10-CM | POA: Diagnosis not present

## 2022-02-16 MED ORDER — METOPROLOL SUCCINATE ER 50 MG PO TB24: 50.0000 mg | ORAL_TABLET | Freq: Every day | ORAL | 3 refills | Status: DC

## 2022-02-16 NOTE — Telephone Encounter (Signed)
Calling to get patient seen today. His thyroid is causing his HR to increase. Please advise

## 2022-02-16 NOTE — Patient Instructions (Signed)
Medication Instructions:  Please discontinue your Amiodarone and increase Metoprolol Succinate to 50 mg a day. Continue all other medications as listed.  *If you need a refill on your cardiac medications before your next appointment, please call your pharmacy*  Follow-Up: At [facility], you and your health needs are our priority.  As part of our continuing mission to provide you with exceptional heart care, we have created designated Provider Care Teams.  These Care Teams include your primary Cardiologist (physician) and Advanced Practice Providers (APPs -  Physician Assistants and Nurse Practitioners) who all work together to provide you with the care you need, when you need it.  We recommend signing up for the patient portal called "MyChart".  Sign up information is provided on this After Visit Summary.  MyChart is used to connect with patients for Virtual Visits (Telemedicine).  Patients are able to view lab/test results, encounter notes, upcoming appointments, etc.  Non-urgent messages can be sent to your provider as well.   To learn more about what you can do with MyChart, go to [email or web address].    Your next appointment:   1 month(s)  The format for your next appointment:   In Person  Provider:   Nicholes Rough, PA-C, Melina Copa, PA-C, Ambrose Pancoast, NP, Ermalinda Barrios, PA-C, Christen Bame, NP, or Richardson Dopp, PA-C          Important Information About Sugar

## 2022-02-16 NOTE — Telephone Encounter (Signed)
Spoke to Dan Europe at PCP office.  She reports pt came in w/ elevated HRs, avg 105-115. Blood work drawn: Mag, K+ and TSH.   TSH/T3/T4 showed some abnormality. They increased Toprol to 37.5 mg and states pt is tolerating ok, including BPs. Pt has already been scheduled to be seen by DOD today. Aware I will update DOD with this information.
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
## 2022-02-17 ENCOUNTER — Ambulatory Visit: Payer: BC Managed Care – PPO | Admitting: Physician Assistant

## 2022-02-22 ENCOUNTER — Telehealth: Payer: Self-pay | Admitting: Cardiology

## 2022-02-22 MED ORDER — METOPROLOL SUCCINATE ER 50 MG PO TB24: 100.0000 mg | ORAL_TABLET | Freq: Every day | ORAL | 3 refills | Status: DC

## 2022-02-22 NOTE — Telephone Encounter (Signed)
Spoke with [place], NP and advised per Dr Marlou Porch have pt increase Metoprolol to '100mg'$  by mouth daily and follow up in clinic in 2 days.  Will contact pt regarding appointment.  Continue Eliquis. Reviewed ED precautions and she will relay to pt.  She will have pt take Metoprolol Succinate '50mg'$  bid as he needs some additional HR control tonight.  NP verbalizes understanding and agrees with current plan.

## 2022-02-22 NOTE — Telephone Encounter (Signed)
She states that pt amiodarone was discontinued and his hr is still high between 100-120. She states pt has has some pressure and would like to speak to someone because pt is in the office there.

## 2022-02-22 NOTE — Telephone Encounter (Signed)
Spoke with provider who states pt is currently in the office stating he feels "off today"  Current HR 100-120 and irregular.  Pt denies current CP, SOB or dizziness but does report chest pressure on exertion.  She would like for Dr Marlou Porch to give guidance regarding if she should increase his Metoprolol, ED or office appointment tomorrow 02/23/2022. Advised will forward to Dr Marlou Porch.  Providers cell number is [phone].

## 2022-02-22 NOTE — Telephone Encounter (Signed)
Spoke with Dr Marlou Porch who recommends pt increase his Metoprolol Succinate to '100mg'$  by mouth daily and RTC in 2 days.

## 2022-02-22 NOTE — Telephone Encounter (Signed)
Spoke with pt and advised appointment scheduled with Ambrose Pancoast, NP on 02/25/2022 at 1030am.  Pt verbalizes understanding and agrees with current plan.
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
## 2022-02-25 ENCOUNTER — Ambulatory Visit: Payer: BC Managed Care – PPO | Attending: Nurse Practitioner | Admitting: Nurse Practitioner

## 2022-02-25 ENCOUNTER — Encounter: Payer: Self-pay | Admitting: Nurse Practitioner

## 2022-02-25 VITALS — BP 122/80 | HR 58 | Ht 68.5 in | Wt 187.2 lb

## 2022-02-25 DIAGNOSIS — I10 Essential (primary) hypertension: Secondary | ICD-10-CM

## 2022-02-25 DIAGNOSIS — E0590 Thyrotoxicosis, unspecified without thyrotoxic crisis or storm: Secondary | ICD-10-CM

## 2022-02-25 DIAGNOSIS — E041 Nontoxic single thyroid nodule: Secondary | ICD-10-CM | POA: Diagnosis not present

## 2022-02-25 DIAGNOSIS — Z86718 Personal history of other venous thrombosis and embolism: Secondary | ICD-10-CM

## 2022-02-25 DIAGNOSIS — I480 Paroxysmal atrial fibrillation: Secondary | ICD-10-CM | POA: Diagnosis not present

## 2022-02-25 MED ORDER — METOPROLOL SUCCINATE ER 50 MG PO TB24: 100.0000 mg | ORAL_TABLET | Freq: Every day | ORAL | 0 refills | Status: DC

## 2022-02-25 NOTE — Patient Instructions (Signed)
Medication Instructions:  You can take an extra '25mg'$  of Metoprolol if your heart rate is over 100.  *If you need a refill on your cardiac medications before your next appointment, please call your pharmacy*   Lab Work: None ordered   Testing/Procedures: None Ordered   Follow-Up: At [facility], you and your health needs are our priority.  As part of our continuing mission to provide you with exceptional heart care, we have created designated Provider Care Teams.  These Care Teams include your primary Cardiologist (physician) and Advanced Practice Providers (APPs -  Physician Assistants and Nurse Practitioners) who all work together to provide you with the care you need, when you need it.  We recommend signing up for the patient portal called "MyChart".  Sign up information is provided on this After Visit Summary.  MyChart is used to connect with patients for Virtual Visits (Telemedicine).  Patients are able to view lab/test results, encounter notes, upcoming appointments, etc.  Non-urgent messages can be sent to your provider as well.   To learn more about what you can do with MyChart, go to [email or web address].    Your next appointment:   As scheduled   The format for your next appointment:   In Person  Provider:   Richardson Dopp, PA-C       Other Instructions   Important Information About Sugar
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
## 2022-03-08 ENCOUNTER — Ambulatory Visit: Payer: BC Managed Care – PPO | Attending: Nurse Practitioner

## 2022-03-08 ENCOUNTER — Telehealth: Payer: Self-pay | Admitting: Interventional Cardiology

## 2022-03-08 ENCOUNTER — Ambulatory Visit: Payer: BC Managed Care – PPO | Admitting: Student

## 2022-03-08 ENCOUNTER — Encounter: Payer: Self-pay | Admitting: *Deleted

## 2022-03-08 DIAGNOSIS — I471 Supraventricular tachycardia, unspecified: Secondary | ICD-10-CM

## 2022-03-08 DIAGNOSIS — I480 Paroxysmal atrial fibrillation: Secondary | ICD-10-CM

## 2022-03-08 NOTE — Telephone Encounter (Signed)
Pt called back per Mr. Jaquelyn Bitter NP orders received below: Please order 14-day ZIO monitor due to elevated heart rate and to quantify AF burden.  Please advise patient to continue as needed dose of Toprol for rate control.  Please let me know if you have any additional questions.  Ambrose Pancoast, NP    Pt understood instructions per breakthrough tachycardia, and only taking 25 Mg of Toprol XL for HR greater than 100.    Pt also understood why Mr. Jaquelyn Bitter NP ordered 14 Day Zio Monitor.  Pt educated on what to expect, will receive and place monitor at home, and we will follow up with him when the monitor data is received and complete.  Pt told to call [facility] if he has any questions about the monitor when he receives it in the mail.  Pt understood Plan of Care, and will call with questions or concerns.

## 2022-03-08 NOTE — Telephone Encounter (Signed)
Pt called back regarding smart watch readings of his HR rate being greater than 100 BPM.    Pt states he is asymptomatic, but HR this morning was elevated 117 to 120 BPM.  At time of call his HR was upper 80's to 90 BPM.    After increasing Pt Toprol XL to 50 mg BID, Pt was advised by Mr. Jaquelyn Bitter NP to take an additional 25 mg Toprol XL for breakthrough tachycardia.    Mr. Jaquelyn Bitter NP also stated in his note, that if his A-Fib related tachycardia continues, that an Zio event monitor may be recommended.    Since Pt HR decreased after taking his morning Toprol XL, Pt advised to do the following:  If Atrial tachycardia continues 2-3 hours after morning Toprol XL, to take the 25 mg Toprol XL for breakthrough tachycardia as advised by provider.  Call [facility] and let us know. Mr. Jaquelyn Bitter NP will be made aware of intermittent Tachycardia, that only occurs every few days.... ( Per the reason for this call to [facility]. )  Find out if a Zio event monitor is now recommended, by messaging Mr. Jaquelyn Bitter NP? Pt told to contact [facility] if the Atrial Tachycardia occurs again this week, to call prior to Thanksgiving break.     Pt understood recommendations, and note will be forwarded to Mr. Jaquelyn Bitter NP.

## 2022-03-08 NOTE — Telephone Encounter (Signed)
STAT if HR is under 50 or over 120 (normal HR is 60-100 beats per minute)  What is your heart rate?  Running in the 117-120 range at rest this morning  Do you have a log of your heart rate readings (document readings)?  Patient has an Apple watch  Do you have any other symptoms?   Caller stated the patient says he has not been having any SOB but can feel his heart rate increasing in his head when his heart rate is going up.  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  Caller stated can call her on her cell - [phone].

## 2022-03-08 NOTE — Progress Notes (Signed)
Enrolled for [employer] to mail a ZIO XT long term holter monitor to the patients address on file.   Letter with instructions mailed to patient.  Dr. Tamala Julian to read.

## 2022-03-08 NOTE — Progress Notes (Unsigned)
Enrolled for [employer] to mail a ZIO XT long term holter monitor to the patients address on file.  Letter with instructions mailed to patient.  Dr. Tamala Julian to read.

## 2022-03-12 DIAGNOSIS — I480 Paroxysmal atrial fibrillation: Secondary | ICD-10-CM | POA: Diagnosis not present

## 2022-03-12 DIAGNOSIS — I471 Supraventricular tachycardia, unspecified: Secondary | ICD-10-CM | POA: Diagnosis not present

## 2022-03-18 DIAGNOSIS — G4733 Obstructive sleep apnea (adult) (pediatric): Secondary | ICD-10-CM | POA: Diagnosis not present
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
## 2022-03-19 ENCOUNTER — Ambulatory Visit: Payer: BC Managed Care – PPO | Attending: Physician Assistant | Admitting: Physician Assistant

## 2022-03-19 ENCOUNTER — Encounter: Payer: Self-pay | Admitting: Physician Assistant

## 2022-03-19 VITALS — BP 110/62 | HR 86 | Ht 68.0 in | Wt 184.4 lb

## 2022-03-19 DIAGNOSIS — G4733 Obstructive sleep apnea (adult) (pediatric): Secondary | ICD-10-CM | POA: Diagnosis not present

## 2022-03-19 DIAGNOSIS — I480 Paroxysmal atrial fibrillation: Secondary | ICD-10-CM | POA: Diagnosis not present

## 2022-03-19 DIAGNOSIS — I10 Essential (primary) hypertension: Secondary | ICD-10-CM | POA: Diagnosis not present

## 2022-03-19 DIAGNOSIS — E0590 Thyrotoxicosis, unspecified without thyrotoxic crisis or storm: Secondary | ICD-10-CM | POA: Insufficient documentation

## 2022-03-19 MED ORDER — METOPROLOL SUCCINATE ER 50 MG PO TB24: 75.0000 mg | ORAL_TABLET | Freq: Two times a day (BID) | ORAL | 3 refills | Status: DC

## 2022-03-19 NOTE — Assessment & Plan Note (Signed)
He wears CPAP nightly.

## 2022-03-19 NOTE — Assessment & Plan Note (Signed)
Blood pressure is well-controlled.  I have asked him to notify me if he has low blood pressures with increasing his Toprol.  If so, I will reduce his dose of losartan.  For now, continue amlodipine 2.5 mg daily, HCTZ 25 mg daily, losartan 100 mg daily.  Adjust Toprol as noted.

## 2022-03-19 NOTE — Assessment & Plan Note (Addendum)
Overall, he is feeling better.  We did check a rhythm strip on his Apple Watch today.  He remains in atrial fibrillation.  His heart rate is much better controlled.  He still does have some fluctuations in his HR.  He is currently wearing a Zio patch monitor.  I have recommended that we increase his Toprol-XL to 75 mg twice daily.  Continue Eliquis 5 mg twice daily.  Obtain follow-up labs from primary care regarding his thyroid.  Once his thyroid levels are normal, we can likely proceed with cardioversion.  We discussed the risks and benefits of a cardioversion today.  Plan follow-up in 1 month with an EKG.  If he remains in atrial fibrillation at that time, we will arrange cardioversion.

## 2022-03-19 NOTE — Patient Instructions (Signed)
Medication Instructions:   INCREASE Toprol one and one half tablet (75 mg) by mouth twice daily.   *If you need a refill on your cardiac medications before your next appointment, please call your pharmacy*   Lab Work:  None ordered.  If you have labs (blood work) drawn today and your tests are completely normal, you will receive your results only by: [place] (if you have MyChart) OR A paper copy in the mail If you have any lab test that is abnormal or we need to change your treatment, we will call you to review the results.   Testing/Procedures:  None ordered.   Follow-Up: At [facility], you and your health needs are our priority.  As part of our continuing mission to provide you with exceptional heart care, we have created designated Provider Care Teams.  These Care Teams include your primary Cardiologist (physician) and Advanced Practice Providers (APPs -  Physician Assistants and Nurse Practitioners) who all work together to provide you with the care you need, when you need it.  We recommend signing up for the patient portal called "MyChart".  Sign up information is provided on this After Visit Summary.  MyChart is used to connect with patients for Virtual Visits (Telemedicine).  Patients are able to view lab/test results, encounter notes, upcoming appointments, etc.  Non-urgent messages can be sent to your provider as well.   To learn more about what you can do with MyChart, go to [email or web address].    Your next appointment:   5 week(s)  The format for your next appointment:   In Person  Provider:   Richardson Dopp, PA-C         Other Instructions  Requested labs from PCP at [facility] with Victorino December, [place] # [phone].  Important Information About Sugar

## 2022-03-19 NOTE — Assessment & Plan Note (Signed)
Managed by primary care.  He is on methimazole.  Obtain recent labs.

## 2022-03-31 ENCOUNTER — Telehealth: Payer: Self-pay | Admitting: Physician Assistant

## 2022-03-31 DIAGNOSIS — I471 Supraventricular tachycardia, unspecified: Secondary | ICD-10-CM | POA: Diagnosis not present

## 2022-03-31 DIAGNOSIS — I480 Paroxysmal atrial fibrillation: Secondary | ICD-10-CM | POA: Diagnosis not present

## 2022-03-31 NOTE — Telephone Encounter (Signed)
Philip Jackson., NP  You59 minutes ago (12:50 PM)   Thank  you for the update. Can you see if Mr. Corti was symptomatic during this episode and if he is using his PRN dose oh Toprol XL?   Called patient. Patient stated he had no symptoms at that time. Patient stated he never feels it when he is in A. FIB and his metoprolol was changed from PRN to Metoprolol 75 mg BID.

## 2022-03-31 NOTE — Telephone Encounter (Signed)
Reference number- [id number]  March Rummage with [employer] called to report, Rapid A.  FIB at 180 bpm for 60 seconds on 03/16/22 3:21 PM. Report has been uploaded. Will forward to [employer], [employer].

## 2022-03-31 NOTE — Telephone Encounter (Signed)
[employer] called to talk with triage about an abnormal zio monitor

## 2022-04-05 ENCOUNTER — Telehealth: Payer: Self-pay | Admitting: Interventional Cardiology

## 2022-04-05 NOTE — Telephone Encounter (Signed)
Left message for the patient to contact the office. 

## 2022-04-05 NOTE — Telephone Encounter (Signed)
Patient is returning call to discuss Zio monitor results.

## 2022-04-05 NOTE — Telephone Encounter (Signed)
Patient notified directly and voiced understanding.  

## 2022-04-17 DIAGNOSIS — G4733 Obstructive sleep apnea (adult) (pediatric): Secondary | ICD-10-CM | POA: Diagnosis not present

## 2022-04-20 ENCOUNTER — Other Ambulatory Visit: Payer: Self-pay | Admitting: Interventional Cardiology

## 2022-04-21 ENCOUNTER — Ambulatory Visit: Payer: BC Managed Care – PPO | Admitting: Physician Assistant

## 2022-04-21 NOTE — Progress Notes (Addendum)
Cardiology Office Note:    Date:  04/26/2022   ID:  Philip Jackson, DOB 1950/08/22, MRN [id number]  PCP:  [facility], [place] [facility] Cardiologist:  Candee Furbish, MD Electrophysiologist:  Cristopher Peru, MD     Referring MD: [facility]   Chief Complaint:  F/u for AFib    Patient Profile: Paroxysmal AFib CHADS2-VASc=2 (HTN, age x 1) Monitor 09/2018: 44% burden; NSVT (17 beats) Hematuria on Coumadin >> Eliquis Amiodarone Rx >> DCd 01/2022 2/2 ? TSH Monitor 04/03/2022: Continuous A-fib with avg HR 92; PVC burden 3% PSVT Sleep Apnea Hypertension  Hyperlipidemia  Hyperthyroidism  GERD Hx of pulmonary embolism in 12/2018 (RLE DVT) ETT 01/07/15: normal  TTE 06/20/14: Mild LVH, EF 55-60, Gr 1 DD  TTE 12/26/2018: EF 55-60, mild LVH, GR 2 DD, aortic root dilation (3.1 cm), trivial MR, PASP 36, mild LAE TTE 01/06/2019: EF 60-65, mild LVH, GR 1 DD, severe RVE, moderately reduced RVSF, mild to moderate LAE, mild MR, mild TR, RAP 3 Carotid US 11/05/20: bilat plaque; no hemodynamically sig stenosis  ABI 11/05/2020: Left 0.93; right 1.17     History of Present Illness:   Philip Jackson is a 72 y.o. male with the above problem list.  He was seen 03/19/2022.  He has recently been followed for recurrent atrial fibrillation and hypothyroidism.  His amiodarone was discontinued.  It was felt that if he remains in atrial fibrillation after his thyroid is corrected, cardioversion should be considered.  He returns for follow-up.  He is here alone.  Overall, he has been doing well without shortness of breath, syncope, orthopnea.  He has noted some right-sided chest tightness recently.  He has right shoulder arthritis.  He was taken off of meloxicam several weeks ago.  His shoulder pain has gotten worse since then.  His chest feels worse with certain positional changes.  He really has not had exertional chest pain.  He has some lower extremity edema that is dependent.  This is unchanged.      EKG:  Atrial fibrillation, HR 109, left axis deviation, nonspecific ST-T wave changes, QTc 47    Reviewed and updated this encounter:  Tobacco  Allergies  Meds  Problems  Med Hx  Surg Hx  Fam Hx     Review of Systems  Gastrointestinal:  Negative for hematochezia.  Genitourinary:  Negative for hematuria.    Labs/Other Test Reviewed:   Recent Labs: 11/23/2021: ALT 21; BUN 24; Creatinine, Ser 1.32; Hemoglobin 14.6; Platelets 308; Potassium 4.0; Sodium 139; TSH 0.547  Recent Lipid Panel No results for input(s): "CHOL", "TRIG", "HDL", "VLDL", "LDLCALC", "LDLDIRECT" in the last 8760 hours.  Risk Assessment/Calculations/Metrics:    CHA2DS2-VASc Score = 2   This indicates a 2.2% annual risk of stroke. The patient's score is based upon: CHF History: 0 HTN History: 1 Diabetes History: 0 Stroke History: 0 Vascular Disease History: 0 Age Score: 1 Gender Score: 0            Physical Exam:   VS:  BP 118/82   Pulse (!) 109   Ht '5\' 9"'$  (1.753 m)   Wt 189 lb 3.2 oz (85.8 kg)   SpO2 98%   BMI 27.94 kg/m    Wt Readings from Last 3 Encounters:  04/26/22 189 lb 3.2 oz (85.8 kg)  03/19/22 184 lb 6.4 oz (83.6 kg)  02/25/22 187 lb 3.2 oz (84.9 kg)    Constitutional:      Appearance: Healthy appearance. Not in  distress.  Neck:     Vascular: JVD normal.  Pulmonary:     Effort: Pulmonary effort is normal.     Breath sounds: No wheezing. No rales.  Cardiovascular:     Tachycardia present. Irregularly irregular rhythm. Normal S1. Normal S2.      Murmurs: There is no murmur.  Edema:    Peripheral edema absent.  Abdominal:     Palpations: There is no hepatomegaly.         ASSESSMENT & PLAN:   PAF (paroxysmal atrial fibrillation) ([place]) He remains in atrial fibrillation. His HR is a little high today. But his recent monitor showed avg HR of 92. His thyroid is much better controlled now. His most recent free T4 was just above the upper limits of normal (1.78 on 04/16/22 - range is  0.82-1.77). He has been having chest pain recently. This sounds more MSK than ischemic. I reviewed his case with Dr. Marlou Porch today. We discussed the patient's chest pain and feel this is more MSK than ischemic. No further testing is needed at this time. We agree with attempting DCCV now to see if we can restore NSR.  -Proceed with DCCV. -Continue Eliquis 5 mg twice daily, Toprol-XL 75 mg twice daily -BMET, CBC -Refer to EP for f/u after DCCV (?ablation candidate) -Pt will f/u with Dr. Marlou Porch in light of Dr. Thompson Caul retirement  Precordial chest pain As noted, his chest pain seems to be more musculoskeletal.  Proceed with cardioversion.  If symptoms continue over time, consider stress testing.  Hyperthyroidism Patient was taken off of amiodarone in October.  His most recent free T4 was improved at 1.78.  He has consultation with endocrinology at [facility] at the beginning of February.  HTN (hypertension) Blood pressure is controlled.  Continue amlodipine 2.5 mg daily, HCTZ 25 mg daily, losartan 100 mg daily, Toprol-XL 75 mg twice daily.        Shared Decision Making/Informed Consent The risks (stroke, cardiac arrhythmias rarely resulting in the need for a temporary or permanent pacemaker, skin irritation or burns and complications associated with conscious sedation including aspiration, arrhythmia, respiratory failure and death), benefits (restoration of normal sinus rhythm) and alternatives of a direct current cardioversion were explained in detail to Mr. Virgin and he agrees to proceed.    Dispo:  Return in about 3 months (around 07/26/2022) for Routine Follow Up w/ Dr. Marlou Porch.  Medication Adjustments/Labs and Tests Ordered: Current medicines are reviewed at length with the patient today.  Concerns regarding medicines are outlined above.  Tests Ordered: Orders Placed This Encounter  Procedures   Basic metabolic panel   CBC   Ambulatory referral to Cardiac Electrophysiology   EKG 12-Lead    Medication Changes: No orders of the defined types were placed in this encounter.  Signed, Richardson Dopp, PA-C  04/26/2022 9:34 AM    [facility] [place], [place], [place]  [address] Phone: [phone]; Fax: [phone]

## 2022-04-21 NOTE — H&P (View-Only) (Signed)
Cardiology Office Note:    Date:  04/26/2022   ID:  Philip Jackson, DOB 1950/11/07, MRN [id number]  PCP:  [facility], [facility] Cardiologist:  Candee Furbish, MD Electrophysiologist:  Cristopher Peru, MD     Referring MD: [facility]   Chief Complaint:  F/u for AFib    Patient Profile: Paroxysmal AFib CHADS2-VASc=2 (HTN, age x 1) Monitor 09/2018: 44% burden; NSVT (17 beats) Hematuria on Coumadin >> Eliquis Amiodarone Rx >> DCd 01/2022 2/2 ? TSH Monitor 04/03/2022: Continuous A-fib with avg HR 92; PVC burden 3% PSVT Sleep Apnea Hypertension  Hyperlipidemia  Hyperthyroidism  GERD Hx of pulmonary embolism in 12/2018 (RLE DVT) ETT 01/07/15: normal  TTE 06/20/14: Mild LVH, EF 55-60, Gr 1 DD  TTE 12/26/2018: EF 55-60, mild LVH, GR 2 DD, aortic root dilation (3.1 cm), trivial MR, PASP 36, mild LAE TTE 01/06/2019: EF 60-65, mild LVH, GR 1 DD, severe RVE, moderately reduced RVSF, mild to moderate LAE, mild MR, mild TR, RAP 3 Carotid US 11/05/20: bilat plaque; no hemodynamically sig stenosis  ABI 11/05/2020: Left 0.93; right 1.17     History of Present Illness:   Philip Jackson is a 72 y.o. male with the above problem list.  He was seen 03/19/2022.  He has recently been followed for recurrent atrial fibrillation and hypothyroidism.  His amiodarone was discontinued.  It was felt that if he remains in atrial fibrillation after his thyroid is corrected, cardioversion should be considered.  He returns for follow-up.  He is here alone.  Overall, he has been doing well without shortness of breath, syncope, orthopnea.  He has noted some right-sided chest tightness recently.  He has right shoulder arthritis.  He was taken off of meloxicam several weeks ago.  His shoulder pain has gotten worse since then.  His chest feels worse with certain positional changes.  He really has not had exertional chest pain.  He has some lower extremity edema that is dependent.  This is unchanged.      EKG:  Atrial fibrillation, HR 109, left axis deviation, nonspecific ST-T wave changes, QTc 47    Reviewed and updated this encounter:  Tobacco  Allergies  Meds  Problems  Med Hx  Surg Hx  Fam Hx     Review of Systems  Gastrointestinal:  Negative for hematochezia.  Genitourinary:  Negative for hematuria.    Labs/Other Test Reviewed:   Recent Labs: 11/23/2021: ALT 21; BUN 24; Creatinine, Ser 1.32; Hemoglobin 14.6; Platelets 308; Potassium 4.0; Sodium 139; TSH 0.547  Recent Lipid Panel No results for input(s): "CHOL", "TRIG", "HDL", "VLDL", "LDLCALC", "LDLDIRECT" in the last 8760 hours.  Risk Assessment/Calculations/Metrics:    CHA2DS2-VASc Score = 2   This indicates a 2.2% annual risk of stroke. The patient's score is based upon: CHF History: 0 HTN History: 1 Diabetes History: 0 Stroke History: 0 Vascular Disease History: 0 Age Score: 1 Gender Score: 0            Physical Exam:   VS:  BP 118/82   Pulse (!) 109   Ht '5\' 9"'$  (1.753 m)   Wt 189 lb 3.2 oz (85.8 kg)   SpO2 98%   BMI 27.94 kg/m    Wt Readings from Last 3 Encounters:  04/26/22 189 lb 3.2 oz (85.8 kg)  03/19/22 184 lb 6.4 oz (83.6 kg)  02/25/22 187 lb 3.2 oz (84.9 kg)    Constitutional:      Appearance: Healthy appearance. Not in  distress.  Neck:     Vascular: JVD normal.  Pulmonary:     Effort: Pulmonary effort is normal.     Breath sounds: No wheezing. No rales.  Cardiovascular:     Tachycardia present. Irregularly irregular rhythm. Normal S1. Normal S2.      Murmurs: There is no murmur.  Edema:    Peripheral edema absent.  Abdominal:     Palpations: There is no hepatomegaly.         ASSESSMENT & PLAN:   PAF (paroxysmal atrial fibrillation) ([place]) He remains in atrial fibrillation. His HR is a little high today. But his recent monitor showed avg HR of 92. His thyroid is much better controlled now. His most recent free T4 was just above the upper limits of normal (1.78 on 04/16/22 - range is  0.82-1.77). He has been having chest pain recently. This sounds more MSK than ischemic. I reviewed his case with Dr. Marlou Porch today. We discussed the patient's chest pain and feel this is more MSK than ischemic. No further testing is needed at this time. We agree with attempting DCCV now to see if we can restore NSR.  -Proceed with DCCV. -Continue Eliquis 5 mg twice daily, Toprol-XL 75 mg twice daily -BMET, CBC -Refer to EP for f/u after DCCV (?ablation candidate) -Pt will f/u with Dr. Marlou Porch in light of Dr. Thompson Caul retirement  Precordial chest pain As noted, his chest pain seems to be more musculoskeletal.  Proceed with cardioversion.  If symptoms continue over time, consider stress testing.  Hyperthyroidism Patient was taken off of amiodarone in October.  His most recent free T4 was improved at 1.78.  He has consultation with endocrinology at [facility] at the beginning of February.  HTN (hypertension) Blood pressure is controlled.  Continue amlodipine 2.5 mg daily, HCTZ 25 mg daily, losartan 100 mg daily, Toprol-XL 75 mg twice daily.        Shared Decision Making/Informed Consent The risks (stroke, cardiac arrhythmias rarely resulting in the need for a temporary or permanent pacemaker, skin irritation or burns and complications associated with conscious sedation including aspiration, arrhythmia, respiratory failure and death), benefits (restoration of normal sinus rhythm) and alternatives of a direct current cardioversion were explained in detail to [name] and he agrees to proceed.    Dispo:  Return in about 3 months (around 07/26/2022) for Routine Follow Up w/ Dr. Marlou Porch.  Medication Adjustments/Labs and Tests Ordered: Current medicines are reviewed at length with the patient today.  Concerns regarding medicines are outlined above.  Tests Ordered: Orders Placed This Encounter  Procedures   Basic metabolic panel   CBC   Ambulatory referral to Cardiac Electrophysiology   EKG 12-Lead    Medication Changes: No orders of the defined types were placed in this encounter.  Signed, Richardson Dopp, PA-C  04/26/2022 9:34 AM    [facility] [place], [place], [place]  [address] Phone: [phone]; Fax: [phone]

## 2022-04-26 ENCOUNTER — Ambulatory Visit: Payer: BC Managed Care – PPO | Attending: Physician Assistant | Admitting: Physician Assistant

## 2022-04-26 ENCOUNTER — Encounter: Payer: Self-pay | Admitting: Physician Assistant

## 2022-04-26 VITALS — BP 118/82 | HR 109 | Ht 69.0 in | Wt 189.2 lb

## 2022-04-26 DIAGNOSIS — I10 Essential (primary) hypertension: Secondary | ICD-10-CM | POA: Diagnosis not present

## 2022-04-26 DIAGNOSIS — E0590 Thyrotoxicosis, unspecified without thyrotoxic crisis or storm: Secondary | ICD-10-CM

## 2022-04-26 DIAGNOSIS — R072 Precordial pain: Secondary | ICD-10-CM | POA: Insufficient documentation

## 2022-04-26 DIAGNOSIS — I480 Paroxysmal atrial fibrillation: Secondary | ICD-10-CM

## 2022-04-26 NOTE — Assessment & Plan Note (Signed)
Blood pressure is controlled.  Continue amlodipine 2.5 mg daily, HCTZ 25 mg daily, losartan 100 mg daily, Toprol-XL 75 mg twice daily.

## 2022-04-26 NOTE — Assessment & Plan Note (Signed)
Patient was taken off of amiodarone in October.  His most recent free T4 was improved at 1.78.  He has consultation with endocrinology at [facility] at the beginning of February.

## 2022-04-26 NOTE — Assessment & Plan Note (Signed)
As noted, his chest pain seems to be more musculoskeletal.  Proceed with cardioversion.  If symptoms continue over time, consider stress testing.

## 2022-04-26 NOTE — Patient Instructions (Addendum)
Medication Instructions:  Your physician recommends that you continue on your current medications as directed. Please refer to the Current Medication list given to you today.  *If you need a refill on your cardiac medications before your next appointment, please call your pharmacy*   Lab Work: 05/03/22:  COME TO THE LAB, ANYTIME AFTER 7:15 FOR:  BMET & CBC  If you have labs (blood work) drawn today and your tests are completely normal, you will receive your results only by: [place] (if you have MyChart) OR A paper copy in the mail If you have any lab test that is abnormal or we need to change your treatment, we will call you to review the results.   Testing/Procedures: Your physician has recommended that you have a Cardioversion (DCCV). Electrical Cardioversion uses a jolt of electricity to your heart either through paddles or wired patches attached to your chest. This is a controlled, usually prescheduled, procedure. Defibrillation is done under light anesthesia in the hospital, and you usually go home the day of the procedure. This is done to get your heart back into a normal rhythm. You are not awake for the procedure. Please see the instruction sheet given to BELOW:      Dear Philip Jackson  You are scheduled for a Cardioversion on Thursday, January 18 with Dr. Harrington Challenger.  Please arrive at the [facility] (Main Entrance A) at [facility]: [address] [place], [place] [address] at 10:30 AM.   DIET:  Nothing to eat or drink after midnight except a sip of water with medications (see medication instructions below)  MEDICATION INSTRUCTIONS: HOLD THE HYDROCHLOROTHIAZIDE ON THE MORNING OF THE PROCEDURE Continue taking your anticoagulant (blood thinner): Apixaban (Eliquis).  You will need to continue this after your procedure until you are told by your provider that it is safe to stop.    LABS:   Come to the lab at [facility] at [address] between the hours  of 7:15 am and 5:00 pm. You do NOT have to be fasting.   FYI:  For your safety, and to allow [place] to monitor your vital signs accurately during the surgery/procedure we request: If you have artificial nails, gel coating, SNS etc, please have those removed prior to your surgery/procedure. Not having the nail coverings /polish removed may result in cancellation or delay of your surgery/procedure.  You must have a responsible person to drive you home and stay in the waiting area during your procedure. Failure to do so could result in cancellation.  Bring your insurance cards.  *Special Note: Every effort is made to have your procedure done on time. Occasionally there are emergencies that occur at the hospital that may cause delays. Please be patient if a delay does occur.    You have been referred to Dr. Myles Gip, EP, to discuss Ablation  Follow-Up: At [facility], you and your health needs are our priority.  As part of our continuing mission to provide you with exceptional heart care, we have created designated Provider Care Teams.  These Care Teams include your primary Cardiologist (physician) and Advanced Practice Providers (APPs -  Physician Assistants and Nurse Practitioners) who all work together to provide you with the care you need, when you need it.  We recommend signing up for the patient portal called "MyChart".  Sign up information is provided on this After Visit Summary.  MyChart is used to connect with patients for Virtual Visits (Telemedicine).  Patients are  able to view lab/test results, encounter notes, upcoming appointments, etc.  Non-urgent messages can be sent to your provider as well.   To learn more about what you can do with MyChart, go to [email or web address].    Your next appointment:   3 MONTHS  The format for your next appointment:   In Person  Provider:   Candee Furbish, MD   Other Instructions   Important Information About [place]

## 2022-04-26 NOTE — Assessment & Plan Note (Addendum)
He remains in atrial fibrillation. His HR is a little high today. But his recent monitor showed avg HR of 92. His thyroid is much better controlled now. His most recent free T4 was just above the upper limits of normal (1.78 on 04/16/22 - range is 0.82-1.77). He has been having chest pain recently. This sounds more MSK than ischemic. I reviewed his case with Dr. Marlou Porch today. We discussed the patient's chest pain and feel this is more MSK than ischemic. No further testing is needed at this time. We agree with attempting DCCV now to see if we can restore NSR.  -Proceed with DCCV. -Continue Eliquis 5 mg twice daily, Toprol-XL 75 mg twice daily -BMET, CBC -Refer to EP for f/u after DCCV (?ablation candidate) -Pt will f/u with Dr. Marlou Porch in light of Dr. Thompson Caul retirement

## 2022-05-03 ENCOUNTER — Ambulatory Visit: Payer: BC Managed Care – PPO | Attending: Physician Assistant

## 2022-05-03 DIAGNOSIS — I480 Paroxysmal atrial fibrillation: Secondary | ICD-10-CM

## 2022-05-03 LAB — CBC
Hematocrit: 41.8 % (ref 37.5–51.0)
Hemoglobin: 13.9 g/dL (ref 13.0–17.7)
MCH: 28.2 pg (ref 26.6–33.0)
MCHC: 33.3 g/dL (ref 31.5–35.7)
MCV: 85 fL (ref 79–97)
Platelets: 305 10*3/uL (ref 150–450)
RBC: 4.93 x10E6/uL (ref 4.14–5.80)
RDW: 15.2 % (ref 11.6–15.4)
WBC: 9.6 10*3/uL (ref 3.4–10.8)

## 2022-05-03 LAB — BASIC METABOLIC PANEL
BUN/Creatinine Ratio: 15 (ref 10–24)
BUN: 17 mg/dL (ref 8–27)
CO2: 27 mmol/L (ref 20–29)
Calcium: 9.4 mg/dL (ref 8.6–10.2)
Chloride: 104 mmol/L (ref 96–106)
Creatinine, Ser: 1.16 mg/dL (ref 0.76–1.27)
Glucose: 123 mg/dL — ABNORMAL HIGH (ref 70–99)
Potassium: 4.2 mmol/L (ref 3.5–5.2)
Sodium: 139 mmol/L (ref 134–144)
eGFR: 67 mL/min/{1.73_m2} (ref >59)

## 2022-05-06 ENCOUNTER — Encounter (HOSPITAL_COMMUNITY)
Admission: RE | Disposition: A | Discharge status: Home / Self Care | Payer: Self-pay | Source: Home / Self Care | Attending: Internal Medicine

## 2022-05-06 ENCOUNTER — Ambulatory Visit (HOSPITAL_COMMUNITY)
Admission: RE | Admit: 2022-05-06 | Discharge: 2022-05-06 | Disposition: A | Discharge status: Home / Self Care | Payer: BC Managed Care – PPO | Attending: Internal Medicine | Admitting: Internal Medicine

## 2022-05-06 ENCOUNTER — Ambulatory Visit (HOSPITAL_COMMUNITY): Payer: BC Managed Care – PPO | Admitting: Certified Registered"

## 2022-05-06 ENCOUNTER — Other Ambulatory Visit: Payer: Self-pay

## 2022-05-06 ENCOUNTER — Encounter (HOSPITAL_COMMUNITY): Payer: Self-pay | Admitting: Internal Medicine

## 2022-05-06 DIAGNOSIS — E785 Hyperlipidemia, unspecified: Secondary | ICD-10-CM | POA: Insufficient documentation

## 2022-05-06 DIAGNOSIS — E039 Hypothyroidism, unspecified: Secondary | ICD-10-CM | POA: Diagnosis not present

## 2022-05-06 DIAGNOSIS — Z7901 Long term (current) use of anticoagulants: Secondary | ICD-10-CM | POA: Diagnosis not present

## 2022-05-06 DIAGNOSIS — Z86711 Personal history of pulmonary embolism: Secondary | ICD-10-CM | POA: Diagnosis not present

## 2022-05-06 DIAGNOSIS — Z86718 Personal history of other venous thrombosis and embolism: Secondary | ICD-10-CM | POA: Insufficient documentation

## 2022-05-06 DIAGNOSIS — Z79899 Other long term (current) drug therapy: Secondary | ICD-10-CM | POA: Insufficient documentation

## 2022-05-06 DIAGNOSIS — Z87891 Personal history of nicotine dependence: Secondary | ICD-10-CM | POA: Diagnosis not present

## 2022-05-06 DIAGNOSIS — G4730 Sleep apnea, unspecified: Secondary | ICD-10-CM | POA: Diagnosis not present

## 2022-05-06 DIAGNOSIS — G4733 Obstructive sleep apnea (adult) (pediatric): Secondary | ICD-10-CM | POA: Diagnosis not present

## 2022-05-06 DIAGNOSIS — K219 Gastro-esophageal reflux disease without esophagitis: Secondary | ICD-10-CM | POA: Insufficient documentation

## 2022-05-06 DIAGNOSIS — I4891 Unspecified atrial fibrillation: Secondary | ICD-10-CM

## 2022-05-06 DIAGNOSIS — M19011 Primary osteoarthritis, right shoulder: Secondary | ICD-10-CM | POA: Insufficient documentation

## 2022-05-06 DIAGNOSIS — I10 Essential (primary) hypertension: Secondary | ICD-10-CM | POA: Insufficient documentation

## 2022-05-06 DIAGNOSIS — I480 Paroxysmal atrial fibrillation: Secondary | ICD-10-CM

## 2022-05-06 DIAGNOSIS — E0590 Thyrotoxicosis, unspecified without thyrotoxic crisis or storm: Secondary | ICD-10-CM | POA: Insufficient documentation

## 2022-05-06 HISTORY — PX: CARDIOVERSION: SHX1299

## 2022-05-06 SURGERY — CARDIOVERSION
Anesthesia: General

## 2022-05-06 MED ORDER — SODIUM CHLORIDE 0.9 % IV SOLN: INTRAVENOUS | Status: DC

## 2022-05-06 MED ORDER — HYDROCORTISONE 1 % EX CREA: 1.0000 | TOPICAL_CREAM | Freq: Three times a day (TID) | CUTANEOUS | Status: DC | PRN

## 2022-05-06 MED ORDER — PROPOFOL 10 MG/ML IV BOLUS
INTRAVENOUS | Status: DC | PRN
  Administered 2022-05-06: 50 mg via INTRAVENOUS

## 2022-05-06 MED ORDER — LIDOCAINE 2% (20 MG/ML) 5 ML SYRINGE
INTRAMUSCULAR | Status: DC | PRN
  Administered 2022-05-06: 50 mg via INTRAVENOUS

## 2022-05-06 NOTE — CV Procedure (Signed)
CARDIOVERSION  Indication:   Atrial fibrillation  Patient sedated by anesthesia with 50 mg  propofol and 50 mg lidocaine intravenously     With pads in AP position, patient cardioverted to SR with 200 J synchronized biphasic energy  Procedure was without complication  12 lead EKG pending  Philip Carnes MD

## 2022-05-06 NOTE — Interval H&P Note (Signed)
History and Physical Interval Note:  05/06/2022 10:29 AM  Philip Jackson  has presented today for surgery, with the diagnosis of AFIB.  The various methods of treatment have been discussed with the patient and family. After consideration of risks, benefits and other options for treatment, the patient has consented to  Procedure(s): CARDIOVERSION (N/A) as a surgical intervention.  The patient's history has been reviewed, patient examined, no change in status, stable for surgery.  I have reviewed the patient's chart and labs.  Questions were answered to the patient's satisfaction.     [name]

## 2022-05-06 NOTE — Transfer of Care (Signed)
Immediate Anesthesia Transfer of Care Note  Patient: Philip Jackson  Procedure(s) Performed: CARDIOVERSION  Patient Location: Endoscopy Unit  Anesthesia Type:General  Level of Consciousness: awake, drowsy, and patient cooperative  Airway & Oxygen Therapy: Patient Spontanous Breathing  Post-op Assessment: Report given to RN, Post -op Vital signs reviewed and stable, and Patient moving all extremities X 4  Post vital signs: Reviewed and stable  Last Vitals:  Vitals Value Taken Time  BP    Temp    Pulse    Resp    SpO2      Last Pain:  Vitals:   05/06/22 1008  TempSrc: Temporal  PainSc: 0-No pain         Complications: No notable events documented.

## 2022-05-06 NOTE — Anesthesia Postprocedure Evaluation (Signed)
Anesthesia Post Note  Patient: Philip Jackson  Procedure(s) Performed: CARDIOVERSION     Patient location during evaluation: PACU Anesthesia Type: General Level of consciousness: awake and alert and oriented Pain management: pain level controlled Vital Signs Assessment: post-procedure vital signs reviewed and stable Respiratory status: spontaneous breathing, nonlabored ventilation and respiratory function stable Cardiovascular status: blood pressure returned to baseline and stable Postop Assessment: no apparent nausea or vomiting Anesthetic complications: no   No notable events documented.  Last Vitals:  Vitals:   05/06/22 1008  BP: 113/79  Resp: 18  Temp: (!) 36 C  SpO2: 95%    Last Pain:  Vitals:   05/06/22 1008  TempSrc: Temporal  PainSc: 0-No pain                 Philip Jackson A.

## 2022-05-06 NOTE — Interval H&P Note (Signed)
History and Physical Interval Note:  05/06/2022 10:23 AM  Philip Jackson  has presented today for surgery, with the diagnosis of AFIB.  The various methods of treatment have been discussed with the patient and family. After consideration of risks, benefits and other options for treatment, the patient has consented to  Procedure(s): CARDIOVERSION (N/A) as a surgical intervention.  The patient's history has been reviewed, patient examined, no change in status, stable for surgery.  I have reviewed the patient's chart and labs.  Questions were answered to the patient's satisfaction.     [name]

## 2022-05-06 NOTE — Discharge Instructions (Signed)
Electrical Cardioversion  Electrical cardioversion is the delivery of a jolt of electricity to restore a normal rhythm to the heart. A rhythm that is too fast or is not regular keeps the heart from pumping well. In this procedure, sticky patches or metal paddles are placed on the chest to deliver electricity to the heart from a device.  If this information does not answer your questions, please call [facility] office at [phone] to clarify.   Follow these instructions at home: You may have some redness on the skin where the shocks were given.  You may apply over-the-counter hydrocortisone cream or aloe vera to alleviate skin irritation. YOU SHOULD NOT DRIVE, use power tools, machinery or perform tasks that involve climbing or major physical exertion for 24 hours (because of the sedation medicines used during the test).  Take over-the-counter and prescription medicines only as told by your health care provider. Ask your health care provider how to check your pulse. Check it often. Rest for 48 hours after the procedure or as told by your health care provider. Avoid or limit your caffeine use as told by your health care provider. Keep all follow-up visits as told by your health care provider. This is important.  FOLLOW UP:  Please also call with any specific questions about appointments or follow up tests. Electrical Cardioversion  Electrical cardioversion is the delivery of a jolt of electricity to restore a normal rhythm to the heart. A rhythm that is too fast or is not regular keeps the heart from pumping well. In this procedure, sticky patches or metal paddles are placed on the chest to deliver electricity to the heart from a device.  If this information does not answer your questions, please call [facility] office at [phone] to clarify.   Follow these instructions at home: You may have some redness on the skin where the shocks were  given.  You may apply over-the-counter hydrocortisone cream or aloe vera to alleviate skin irritation. YOU SHOULD NOT DRIVE, use power tools, machinery or perform tasks that involve climbing or major physical exertion for 24 hours (because of the sedation medicines used during the test).  Take over-the-counter and prescription medicines only as told by your health care provider. Ask your health care provider how to check your pulse. Check it often. Rest for 48 hours after the procedure or as told by your health care provider. Avoid or limit your caffeine use as told by your health care provider. Keep all follow-up visits as told by your health care provider. This is important.  FOLLOW UP:  Please also call with any specific questions about appointments or follow up tests.  

## 2022-05-06 NOTE — Anesthesia Preprocedure Evaluation (Signed)
Anesthesia Evaluation  Patient identified by MRN, date of birth, ID band Patient awake    Reviewed: Allergy & Precautions, NPO status , Patient's Chart, lab work & pertinent test results, reviewed documented beta blocker date and time   History of Anesthesia Complications (+) history of anesthetic complications  Airway Mallampati: II  TM Distance: >3 FB Neck ROM: Full    Dental  (+) Teeth Intact, Caps, Dental Advisory Given   Pulmonary sleep apnea and Continuous Positive Airway Pressure Ventilation , former smoker   Pulmonary exam normal breath sounds clear to auscultation       Cardiovascular hypertension, Pt. on medications and Pt. on home beta blockers + dysrhythmias Atrial Fibrillation  Rhythm:Irregular Rate:Normal     Neuro/Psych  negative psych ROS   GI/Hepatic ,GERD  Medicated and Controlled,,  Endo/Other   Hyperthyroidism Hyperlipidemia  Renal/GU Renal disease   BPH    Musculoskeletal  (+) Arthritis , Osteoarthritis,    Abdominal   Peds  Hematology  (+) Blood dyscrasia, anemia Eliquis therapy   Anesthesia Other Findings   Reproductive/Obstetrics                              Anesthesia Physical Anesthesia Plan  ASA: 3  Anesthesia Plan: General   Post-op Pain Management: Minimal or no pain anticipated   Induction: Intravenous  PONV Risk Score and Plan: 2 and Treatment may vary due to age or medical condition and Propofol infusion  Airway Management Planned: Natural Airway and Mask  Additional Equipment: None  Intra-op Plan:   Post-operative Plan:   Informed Consent: I have reviewed the patients History and Physical, chart, labs and discussed the procedure including the risks, benefits and alternatives for the proposed anesthesia with the patient or authorized representative who has indicated his/her understanding and acceptance.     Dental advisory given  Plan  Discussed with: CRNA and Anesthesiologist  Anesthesia Plan Comments:          Anesthesia Quick Evaluation

## 2022-05-09 ENCOUNTER — Encounter (HOSPITAL_COMMUNITY): Payer: Self-pay | Admitting: Internal Medicine

## 2022-05-10 ENCOUNTER — Other Ambulatory Visit: Payer: Self-pay | Admitting: Interventional Cardiology

## 2022-05-18 DIAGNOSIS — G4733 Obstructive sleep apnea (adult) (pediatric): Secondary | ICD-10-CM | POA: Diagnosis not present

## 2022-05-21 DIAGNOSIS — E782 Mixed hyperlipidemia: Secondary | ICD-10-CM | POA: Diagnosis not present

## 2022-05-21 DIAGNOSIS — I10 Essential (primary) hypertension: Secondary | ICD-10-CM | POA: Diagnosis not present

## 2022-05-21 DIAGNOSIS — I4891 Unspecified atrial fibrillation: Secondary | ICD-10-CM | POA: Diagnosis not present

## 2022-05-21 DIAGNOSIS — E0590 Thyrotoxicosis, unspecified without thyrotoxic crisis or storm: Secondary | ICD-10-CM | POA: Diagnosis not present

## 2022-06-07 ENCOUNTER — Encounter: Payer: Self-pay | Admitting: Cardiovascular Disease

## 2022-06-07 ENCOUNTER — Ambulatory Visit: Payer: BC Managed Care – PPO | Attending: Cardiovascular Disease | Admitting: Cardiovascular Disease

## 2022-06-07 VITALS — BP 140/72 | HR 52 | Ht 68.0 in | Wt 200.6 lb

## 2022-06-07 DIAGNOSIS — I4819 Other persistent atrial fibrillation: Secondary | ICD-10-CM | POA: Diagnosis not present

## 2022-06-07 NOTE — Patient Instructions (Signed)
Medication Instructions:  Your physician recommends that you continue on your current medications as directed. Please refer to the Current Medication list given to you today.   *If you need a refill on your cardiac medications before your next appointment, please call your pharmacy*   Lab Work: None   Testing/Procedures: Your physician has requested that you have cardiac CT. Cardiac computed tomography (CT) is a painless test that uses an x-ray machine to take clear, detailed pictures of your heart. For further information please visit [email or web address]. Please follow instruction sheet as given.   Your physician has recommended that you have an ablation. Catheter ablation is a medical procedure used to treat some cardiac arrhythmias (irregular heartbeats). During catheter ablation, a long, thin, flexible tube is put into a blood vessel in your groin (upper thigh), or neck. This tube is called an ablation catheter. It is then guided to your heart through the blood vessel. Radio frequency waves destroy small areas of heart tissue where abnormal heartbeats may cause an arrhythmia to start.    The EP scheduler, April G, will call you to schedule your ablation and review instructions with you.     Follow-Up: At [facility], you and your health needs are our priority.  As part of our continuing mission to provide you with exceptional heart care, we have created designated Provider Care Teams.  These Care Teams include your primary Cardiologist (physician) and Advanced Practice Providers (APPs -  Physician Assistants and Nurse Practitioners) who all work together to provide you with the care you need, when you need it.   Your next appointment:   4 week(s) after your ablation   Provider:   You will follow up in the [facility] located at [facility]. Your provider will be: Roderic Palau, NP or Clint R. Fenton, PA-C
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
## 2022-06-28 ENCOUNTER — Telehealth: Payer: Self-pay

## 2022-06-28 DIAGNOSIS — I480 Paroxysmal atrial fibrillation: Secondary | ICD-10-CM

## 2022-06-28 NOTE — Telephone Encounter (Signed)
Pt is scheduled for 6/4 @ 10:30.   Lab appt: 09/07/22..  Once everything is scheduled I will mail instruction letters.

## 2022-07-20 DIAGNOSIS — E0590 Thyrotoxicosis, unspecified without thyrotoxic crisis or storm: Secondary | ICD-10-CM | POA: Diagnosis not present

## 2022-07-20 DIAGNOSIS — Z79899 Other long term (current) drug therapy: Secondary | ICD-10-CM | POA: Diagnosis not present

## 2022-07-20 DIAGNOSIS — D649 Anemia, unspecified: Secondary | ICD-10-CM | POA: Diagnosis not present

## 2022-07-20 DIAGNOSIS — R7303 Prediabetes: Secondary | ICD-10-CM | POA: Diagnosis not present

## 2022-07-20 DIAGNOSIS — I4891 Unspecified atrial fibrillation: Secondary | ICD-10-CM | POA: Diagnosis not present

## 2022-07-28 ENCOUNTER — Ambulatory Visit: Payer: BC Managed Care – PPO | Attending: Cardiology | Admitting: Cardiology

## 2022-07-28 ENCOUNTER — Encounter: Payer: Self-pay | Admitting: Cardiology

## 2022-07-28 VITALS — BP 124/70 | HR 50 | Ht 68.0 in | Wt 200.4 lb

## 2022-07-28 DIAGNOSIS — Z86718 Personal history of other venous thrombosis and embolism: Secondary | ICD-10-CM

## 2022-07-28 DIAGNOSIS — E0590 Thyrotoxicosis, unspecified without thyrotoxic crisis or storm: Secondary | ICD-10-CM | POA: Diagnosis not present

## 2022-07-28 DIAGNOSIS — I480 Paroxysmal atrial fibrillation: Secondary | ICD-10-CM | POA: Diagnosis not present

## 2022-07-28 DIAGNOSIS — Z7901 Long term (current) use of anticoagulants: Secondary | ICD-10-CM

## 2022-07-28 MED ORDER — AMLODIPINE BESYLATE 2.5 MG PO TABS: 2.5000 mg | ORAL_TABLET | Freq: Every day | ORAL | 3 refills | Status: AC

## 2022-07-28 NOTE — Patient Instructions (Signed)
Medication Instructions:  Please continue current medications as listed.  *If you need a refill on your cardiac medications before your next appointment, please call your pharmacy*  Follow-Up: At [facility], you and your health needs are our priority.  As part of our continuing mission to provide you with exceptional heart care, we have created designated Provider Care Teams.  These Care Teams include your primary Cardiologist (physician) and Advanced Practice Providers (APPs -  Physician Assistants and Nurse Practitioners) who all work together to provide you with the care you need, when you need it.  We recommend signing up for the patient portal called "MyChart".  Sign up information is provided on this After Visit Summary.  MyChart is used to connect with patients for Virtual Visits (Telemedicine).  Patients are able to view lab/test results, encounter notes, upcoming appointments, etc.  Non-urgent messages can be sent to your provider as well.   To learn more about what you can do with MyChart, go to [email or web address].    Your next appointment:   6 month(s)  Provider:   Jari Favre, PA-C, Robin Searing, NP, Jacolyn Reedy, PA-C, Eligha Bridegroom, NP, or Tereso Newcomer, PA-C     Then, Donato Schultz, MD will plan to see you again in 1 year(s).
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
## 2022-07-29 DIAGNOSIS — H25812 Combined forms of age-related cataract, left eye: Secondary | ICD-10-CM | POA: Diagnosis not present

## 2022-07-29 DIAGNOSIS — H40013 Open angle with borderline findings, low risk, bilateral: Secondary | ICD-10-CM | POA: Diagnosis not present

## 2022-08-25 ENCOUNTER — Encounter: Payer: Self-pay | Admitting: Cardiovascular Disease

## 2022-09-07 ENCOUNTER — Ambulatory Visit: Payer: BC Managed Care – PPO | Attending: Cardiovascular Disease

## 2022-09-07 DIAGNOSIS — I480 Paroxysmal atrial fibrillation: Secondary | ICD-10-CM

## 2022-09-08 LAB — BASIC METABOLIC PANEL
BUN/Creatinine Ratio: 16 (ref 10–24)
BUN: 18 mg/dL (ref 8–27)
CO2: 23 mmol/L (ref 20–29)
Calcium: 9.2 mg/dL (ref 8.6–10.2)
Chloride: 102 mmol/L (ref 96–106)
Creatinine, Ser: 1.11 mg/dL (ref 0.76–1.27)
Glucose: 113 mg/dL — ABNORMAL HIGH (ref 70–99)
Potassium: 3.5 mmol/L (ref 3.5–5.2)
Sodium: 141 mmol/L (ref 134–144)
eGFR: 71 mL/min/{1.73_m2} (ref >59)

## 2022-09-08 LAB — CBC
Hematocrit: 39.7 % (ref 37.5–51.0)
Hemoglobin: 13.4 g/dL (ref 13.0–17.7)
MCH: 30.2 pg (ref 26.6–33.0)
MCHC: 33.8 g/dL (ref 31.5–35.7)
MCV: 90 fL (ref 79–97)
Platelets: 317 10*3/uL (ref 150–450)
RBC: 4.43 x10E6/uL (ref 4.14–5.80)
RDW: 12.3 % (ref 11.6–15.4)
WBC: 10.1 10*3/uL (ref 3.4–10.8)

## 2022-09-09 ENCOUNTER — Telehealth (HOSPITAL_COMMUNITY): Payer: Self-pay | Admitting: *Deleted

## 2022-09-09 NOTE — Telephone Encounter (Signed)
Reaching out to patient to offer assistance regarding upcoming cardiac imaging study; pt verbalizes understanding of appt date/time, parking situation and where to check in, pre-test NPO status, and verified current allergies; name and call back number provided for further questions should they arise  Larey Brick RN Navigator Cardiac Imaging [facility] [phone] office [phone] cell  Patient to take daily medications and is aware to arrive at 11am.

## 2022-09-10 ENCOUNTER — Ambulatory Visit (HOSPITAL_COMMUNITY)
Admission: RE | Admit: 2022-09-10 | Discharge: 2022-09-10 | Disposition: A | Discharge status: Home / Self Care | Payer: BC Managed Care – PPO | Source: Ambulatory Visit | Attending: Cardiovascular Disease | Admitting: Cardiovascular Disease

## 2022-09-10 DIAGNOSIS — I480 Paroxysmal atrial fibrillation: Secondary | ICD-10-CM | POA: Insufficient documentation

## 2022-09-10 MED ORDER — IOHEXOL 350 MG/ML SOLN
100.0000 mL | Freq: Once | INTRAVENOUS | Status: AC | PRN
  Administered 2022-09-10: 100 mL via INTRAVENOUS

## 2022-09-14 ENCOUNTER — Telehealth: Payer: Self-pay | Admitting: Cardiovascular Disease

## 2022-09-14 NOTE — Telephone Encounter (Signed)
Spoke with patient and reviewed pre-ablation instruction letter. Patient aware to not take any medication the morning of procedure. No further questions at this time.

## 2022-09-14 NOTE — Pre-Procedure Instructions (Signed)
Instructed patient on the following items: Arrival time 1230 Nothing to eat or drink after midnight No meds AM of procedure Responsible person to drive you home and stay with you for 24 hrs  Have you missed any doses of anti-coagulant Eliquis- takes twice a day, hasn't missed any doses.  Don't take dose in the morning.

## 2022-09-14 NOTE — Telephone Encounter (Signed)
Pt c/o medication issue:  1. Name of Medication: Eliquis  2. How are you currently taking this medication (dosage and times per day)?2 times a dayt  3. Are you having a reaction (difficulty breathing--STAT)?   4. What is your medication issue? Patient wants to know if he takes his Eliquis before his procedure tomorrow?

## 2022-09-15 ENCOUNTER — Ambulatory Visit (HOSPITAL_COMMUNITY): Payer: BC Managed Care – PPO | Admitting: Anesthesiology

## 2022-09-15 ENCOUNTER — Encounter (HOSPITAL_COMMUNITY)
Admission: RE | Disposition: A | Discharge status: Home / Self Care | Payer: Self-pay | Source: Ambulatory Visit | Attending: Cardiovascular Disease

## 2022-09-15 ENCOUNTER — Ambulatory Visit (HOSPITAL_COMMUNITY)
Admission: RE | Admit: 2022-09-15 | Discharge: 2022-09-15 | Disposition: A | Discharge status: Home / Self Care | Payer: BC Managed Care – PPO | Source: Ambulatory Visit | Attending: Cardiovascular Disease | Admitting: Cardiovascular Disease

## 2022-09-15 DIAGNOSIS — G4730 Sleep apnea, unspecified: Secondary | ICD-10-CM | POA: Insufficient documentation

## 2022-09-15 DIAGNOSIS — Z87891 Personal history of nicotine dependence: Secondary | ICD-10-CM | POA: Insufficient documentation

## 2022-09-15 DIAGNOSIS — Z8249 Family history of ischemic heart disease and other diseases of the circulatory system: Secondary | ICD-10-CM | POA: Diagnosis not present

## 2022-09-15 DIAGNOSIS — D6869 Other thrombophilia: Secondary | ICD-10-CM | POA: Diagnosis not present

## 2022-09-15 DIAGNOSIS — I4891 Unspecified atrial fibrillation: Secondary | ICD-10-CM | POA: Diagnosis not present

## 2022-09-15 DIAGNOSIS — Z79899 Other long term (current) drug therapy: Secondary | ICD-10-CM | POA: Insufficient documentation

## 2022-09-15 DIAGNOSIS — I4819 Other persistent atrial fibrillation: Secondary | ICD-10-CM | POA: Diagnosis not present

## 2022-09-15 DIAGNOSIS — J449 Chronic obstructive pulmonary disease, unspecified: Secondary | ICD-10-CM | POA: Diagnosis not present

## 2022-09-15 DIAGNOSIS — Z7901 Long term (current) use of anticoagulants: Secondary | ICD-10-CM | POA: Insufficient documentation

## 2022-09-15 DIAGNOSIS — I10 Essential (primary) hypertension: Secondary | ICD-10-CM | POA: Insufficient documentation

## 2022-09-15 HISTORY — PX: ATRIAL FIBRILLATION ABLATION: EP1191

## 2022-09-15 LAB — POCT ACTIVATED CLOTTING TIME
Activated Clotting Time: 298 seconds
Activated Clotting Time: 309 seconds

## 2022-09-15 SURGERY — ATRIAL FIBRILLATION ABLATION
Anesthesia: General

## 2022-09-15 MED ORDER — SODIUM CHLORIDE 0.9 % IV SOLN: INTRAVENOUS | Status: DC

## 2022-09-15 MED ORDER — ONDANSETRON HCL 4 MG/2ML IJ SOLN: 4.0000 mg | Freq: Four times a day (QID) | INTRAMUSCULAR | Status: DC | PRN

## 2022-09-15 MED ORDER — DOBUTAMINE INFUSION FOR EP/ECHO/NUC (1000 MCG/ML)
INTRAVENOUS | Status: DC | PRN
  Administered 2022-09-15: 20 ug/kg/min via INTRAVENOUS

## 2022-09-15 MED ORDER — HEPARIN SODIUM (PORCINE) 1000 UNIT/ML IJ SOLN
INTRAMUSCULAR | Status: DC | PRN
  Administered 2022-09-15: 1000 [IU] via INTRAVENOUS

## 2022-09-15 MED ORDER — ROCURONIUM BROMIDE 10 MG/ML (PF) SYRINGE
PREFILLED_SYRINGE | INTRAVENOUS | Status: DC | PRN
  Administered 2022-09-15: 20 mg via INTRAVENOUS
  Administered 2022-09-15: 60 mg via INTRAVENOUS

## 2022-09-15 MED ORDER — SUGAMMADEX SODIUM 200 MG/2ML IV SOLN
INTRAVENOUS | Status: DC | PRN
  Administered 2022-09-15: 200 mg via INTRAVENOUS

## 2022-09-15 MED ORDER — ACETAMINOPHEN 325 MG PO TABS: 650.0000 mg | ORAL_TABLET | ORAL | Status: DC | PRN

## 2022-09-15 MED ORDER — COLCHICINE 0.6 MG PO TABS: 0.6000 mg | ORAL_TABLET | Freq: Two times a day (BID) | ORAL | 0 refills | Status: DC

## 2022-09-15 MED ORDER — ACETAMINOPHEN 500 MG PO TABS
ORAL_TABLET | ORAL | Status: AC
  Administered 2022-09-15: 1000 mg via ORAL
  Filled 2022-09-15: qty 2

## 2022-09-15 MED ORDER — FENTANYL CITRATE (PF) 100 MCG/2ML IJ SOLN
INTRAMUSCULAR | Status: DC | PRN
  Administered 2022-09-15 (×2): 50 ug via INTRAVENOUS

## 2022-09-15 MED ORDER — PHENYLEPHRINE HCL-NACL 20-0.9 MG/250ML-% IV SOLN
INTRAVENOUS | Status: DC | PRN
  Administered 2022-09-15: 25 ug/min via INTRAVENOUS

## 2022-09-15 MED ORDER — PHENYLEPHRINE 80 MCG/ML (10ML) SYRINGE FOR IV PUSH (FOR BLOOD PRESSURE SUPPORT)
PREFILLED_SYRINGE | INTRAVENOUS | Status: DC | PRN
  Administered 2022-09-15: 80 ug via INTRAVENOUS

## 2022-09-15 MED ORDER — DOBUTAMINE INFUSION FOR EP/ECHO/NUC (1000 MCG/ML)
INTRAVENOUS | Status: AC
  Filled 2022-09-15: qty 250

## 2022-09-15 MED ORDER — HEPARIN (PORCINE) IN NACL 1000-0.9 UT/500ML-% IV SOLN
INTRAVENOUS | Status: DC | PRN
  Administered 2022-09-15 (×4): 500 mL

## 2022-09-15 MED ORDER — ONDANSETRON HCL 4 MG/2ML IJ SOLN
INTRAMUSCULAR | Status: DC | PRN
  Administered 2022-09-15: 4 mg via INTRAVENOUS

## 2022-09-15 MED ORDER — PROPOFOL 10 MG/ML IV BOLUS
INTRAVENOUS | Status: DC | PRN
  Administered 2022-09-15: 160 ug via INTRAVENOUS

## 2022-09-15 MED ORDER — PROTAMINE SULFATE 10 MG/ML IV SOLN
INTRAVENOUS | Status: DC | PRN
  Administered 2022-09-15: 50 mg via INTRAVENOUS

## 2022-09-15 MED ORDER — HEPARIN SODIUM (PORCINE) 1000 UNIT/ML IJ SOLN
INTRAMUSCULAR | Status: AC
  Filled 2022-09-15: qty 10

## 2022-09-15 MED ORDER — LIDOCAINE 2% (20 MG/ML) 5 ML SYRINGE
INTRAMUSCULAR | Status: DC | PRN
  Administered 2022-09-15: 30 mg via INTRAVENOUS

## 2022-09-15 MED ORDER — DEXAMETHASONE SODIUM PHOSPHATE 10 MG/ML IJ SOLN
INTRAMUSCULAR | Status: DC | PRN
  Administered 2022-09-15: 5 mg via INTRAVENOUS

## 2022-09-15 MED ORDER — HEPARIN SODIUM (PORCINE) 1000 UNIT/ML IJ SOLN
INTRAMUSCULAR | Status: DC | PRN
  Administered 2022-09-15: 16000 [IU] via INTRAVENOUS
  Administered 2022-09-15: 2000 [IU] via INTRAVENOUS

## 2022-09-15 MED ORDER — ACETAMINOPHEN 500 MG PO TABS: 1000.0000 mg | ORAL_TABLET | Freq: Once | ORAL | Status: AC

## 2022-09-15 SURGICAL SUPPLY — 19 items
CATH 8FR REPROCESSED SOUNDSTAR (CATHETERS) ×1 IMPLANT
CATH 8FR SOUNDSTAR REPROCESSED (CATHETERS) IMPLANT
CATH ABLAT QDOT MICRO BI TC DF (CATHETERS) IMPLANT
CATH OCTARAY 2.0 F 3-3-3-3-3 (CATHETERS) IMPLANT
CATH PIGTAIL STEERABLE D1 8.7 (WIRE) IMPLANT
CATH S-M CIRCA TEMP PROBE (CATHETERS) IMPLANT
CATH WEBSTER BI DIR CS D-F CRV (CATHETERS) IMPLANT
CLOSURE PERCLOSE PROSTYLE (VASCULAR PRODUCTS) IMPLANT
COVER SWIFTLINK CONNECTOR (BAG) ×1 IMPLANT
DEVICE CLOSURE MYNXGRIP 6/7F (Vascular Products) IMPLANT
PACK EP LATEX FREE (CUSTOM PROCEDURE TRAY) ×1
PACK EP LF (CUSTOM PROCEDURE TRAY) ×1 IMPLANT
PAD DEFIB RADIO PHYSIO CONN (PAD) ×1 IMPLANT
PATCH CARTO3 (PAD) IMPLANT
SHEATH CARTO VIZIGO SM CVD (SHEATH) IMPLANT
SHEATH PINNACLE 8F 10CM (SHEATH) IMPLANT
SHEATH PINNACLE 9F 10CM (SHEATH) IMPLANT
SHEATH PROBE COVER 6X72 (BAG) IMPLANT
TUBING SMART ABLATE COOLFLOW (TUBING) IMPLANT

## 2022-09-15 NOTE — Discharge Instructions (Signed)
Cardiac Ablation, Care After  This sheet gives you information about how to care for yourself after your procedure. Your health care provider may also give you more specific instructions. If you have problems or questions, contact your health care provider. What can I expect after the procedure? After the procedure, it is common to have: Bruising around your puncture site. Tenderness around your puncture site. Skipped heartbeats. If you had an atrial fibrillation ablation, you may have atrial fibrillation during the first several months after your procedure.  Tiredness (fatigue).  Follow these instructions at home: Puncture site care  Follow instructions from your health care provider about how to take care of your puncture site. Make sure you: If present, leave stitches (sutures), skin glue, or adhesive strips in place. These skin closures may need to stay in place for up to 2 weeks. If adhesive strip edges start to loosen and curl up, you may trim the loose edges. Do not remove adhesive strips completely unless your health care provider tells you to do that. If a large square bandage is present, this may be removed 24 hours after surgery.  Check your puncture site every day for signs of infection. Check for: Redness, swelling, or pain. Fluid or blood. If your puncture site starts to bleed, lie down on your back, apply firm pressure to the area, and contact your health care provider. Warmth. Pus or a bad smell. A pea or small marble sized lump at the site is normal and can take up to three months to resolve.  Driving Do not drive for at least 4 days after your procedure or however long your health care provider recommends. (Do not resume driving if you have previously been instructed not to drive for other health reasons.) Do not drive or use heavy machinery while taking prescription pain medicine. Activity Avoid activities that take a lot of effort for at least 7 days after your  procedure. Do not lift anything that is heavier than 5 lb (4.5 kg) for one week.  No sexual activity for 1 week.  Return to your normal activities as told by your health care provider. Ask your health care provider what activities are safe for you. General instructions Take over-the-counter and prescription medicines only as told by your health care provider. Do not use any products that contain nicotine or tobacco, such as cigarettes and e-cigarettes. If you need help quitting, ask your health care provider. You may shower after 24 hours, but Do not take baths, swim, or use a hot tub for 1 week.  Do not drink alcohol for 24 hours after your procedure. Keep all follow-up visits as told by your health care provider. This is important. Contact a health care provider if: You have redness, mild swelling, or pain around your puncture site. You have fluid or blood coming from your puncture site that stops after applying firm pressure to the area. Your puncture site feels warm to the touch. You have pus or a bad smell coming from your puncture site. You have a fever. You have chest pain or discomfort that spreads to your neck, jaw, or arm. You have chest pain that is worse with lying on your back or taking a deep breath. You are sweating a lot. You feel nauseous. You have a fast or irregular heartbeat. You have shortness of breath. You are dizzy or light-headed and feel the need to lie down. You have pain or numbness in the arm or leg closest to your puncture   site. Get help right away if: Your puncture site suddenly swells. Your puncture site is bleeding and the bleeding does not stop after applying firm pressure to the area. These symptoms may represent a serious problem that is an emergency. Do not wait to see if the symptoms will go away. Get medical help right away. Call your local emergency services (911 in the [place] Do not drive yourself to the hospital. Summary After the procedure, it  is normal to have bruising and tenderness at the puncture site in your groin, neck, or forearm. Check your puncture site every day for signs of infection. Get help right away if your puncture site is bleeding and the bleeding does not stop after applying firm pressure to the area. This is a medical emergency. This information is not intended to replace advice given to you by your health care provider. Make sure you discuss any questions you have with your health care provider.   You have an appointment set up with the [facility].  Multiple studies have shown that being followed by a dedicated atrial fibrillation clinic in addition to the standard care you receive from your other physicians improves health. We believe that enrollment in the atrial fibrillation clinic will allow us to better care for you.   The phone number to the [facility] is [phone]. The clinic is staffed Monday through Friday from 8:30am to 5pm.  Directions: The clinic is located in the [facility], 6TH FLOOR Enter the hospital at the MAIN ENTRANCE "A", use North Tower Elevators to the 6th floor.  Registration desk to the right of elevators on 6th floor  If you have any trouble locating the clinic, please don't hesitate to call [phone].   

## 2022-09-15 NOTE — Anesthesia Procedure Notes (Signed)
Procedure Name: Intubation Date/Time: 09/15/2022 2:34 PM  Performed by: De Nurse, CRNAPre-anesthesia Checklist: Patient identified, Emergency Drugs available, Suction available and Patient being monitored Patient Re-evaluated:Patient Re-evaluated prior to induction Oxygen Delivery Method: Circle System Utilized Preoxygenation: Pre-oxygenation with 100% oxygen Induction Type: IV induction Ventilation: Mask ventilation without difficulty Laryngoscope Size: Mac and 4 Grade View: Grade I Tube type: Oral Tube size: 7.5 mm Number of attempts: 1 Airway Equipment and Method: Stylet and Oral airway Placement Confirmation: ETT inserted through vocal cords under direct vision, positive ETCO2 and breath sounds checked- equal and bilateral Secured at: 23 cm Tube secured with: Tape Dental Injury: Teeth and Oropharynx as per pre-operative assessment

## 2022-09-15 NOTE — Anesthesia Preprocedure Evaluation (Addendum)
Anesthesia Evaluation  Patient identified by MRN, date of birth, ID band Patient awake    Reviewed: Allergy & Precautions, NPO status , Patient's Chart, lab work & pertinent test results, reviewed documented beta blocker date and time   History of Anesthesia Complications Negative for: history of anesthetic complications  Airway Mallampati: II  TM Distance: >3 FB Neck ROM: Full    Dental  (+) Dental Advisory Given   Pulmonary sleep apnea and Continuous Positive Airway Pressure Ventilation , COPD,  COPD inhaler, former smoker   breath sounds clear to auscultation       Cardiovascular hypertension, Pt. on medications and Pt. on home beta blockers (-) angina + dysrhythmias Atrial Fibrillation and Supra Ventricular Tachycardia  Rhythm:Regular Rate:Normal  '20 ECHO:   1. Left ventricular EF 60-65%. The left ventricle has normal function. Normal left ventricular size. There is mildly increased LVH.   2. Left ventricular diastolic Doppler parameters are consistent with impaired relaxation pattern of LV diastolic filling.   3. Global RV has moderately reduced systolic function.The RV size is severely enlarged. No increase in right ventricular wall thickness.   4. Left atrial size was mild-moderately dilated.   5. Right atrial size was normal.   6. The MV is normal in structure. Mild MR. No evidence of MS.   7. The tricuspid valve is normal in structure. TR is mild.   8. The AV is normal in structure. Aortic valve regurgitation was not visualized by color flow Doppler. Structurally normal aortic valve, with no evidence of sclerosis or stenosis.     Neuro/Psych negative neurological ROS     GI/Hepatic Neg liver ROS,GERD  Medicated and Controlled,,  Endo/Other  BMI 29.7  Renal/GU Renal InsufficiencyRenal disease     Musculoskeletal  (+) Arthritis ,    Abdominal   Peds  Hematology Eliquis   Anesthesia Other Findings    Reproductive/Obstetrics                              Anesthesia Physical Anesthesia Plan  ASA: 3  Anesthesia Plan: General   Post-op Pain Management: Tylenol PO (pre-op)*   Induction: Intravenous  PONV Risk Score and Plan: 2 and Ondansetron and Dexamethasone  Airway Management Planned: Oral ETT  Additional Equipment: None  Intra-op Plan:   Post-operative Plan: Extubation in OR  Informed Consent: I have reviewed the patients History and Physical, chart, labs and discussed the procedure including the risks, benefits and alternatives for the proposed anesthesia with the patient or authorized representative who has indicated his/her understanding and acceptance.     Dental advisory given  Plan Discussed with: CRNA and Surgeon  Anesthesia Plan Comments:         Anesthesia Quick Evaluation

## 2022-09-15 NOTE — Anesthesia Postprocedure Evaluation (Signed)
Anesthesia Post Note  Patient: Philip Jackson  Procedure(s) Performed: ATRIAL FIBRILLATION ABLATION     Patient location during evaluation: Phase II Anesthesia Type: General Level of consciousness: awake Pain management: pain level controlled Vital Signs Assessment: post-procedure vital signs reviewed and stable Respiratory status: spontaneous breathing Cardiovascular status: stable Postop Assessment: no apparent nausea or vomiting Anesthetic complications: no  There were no known notable events for this encounter.  Last Vitals:  Vitals:   09/15/22 1655 09/15/22 1656  BP: 116/67   Pulse: 64 64  Resp: 15 14  Temp:  36.7 C  SpO2: 92% 92%    Last Pain:  Vitals:   09/15/22 1656  TempSrc: Temporal  PainSc:                  Caren Macadam
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
## 2022-09-15 NOTE — Transfer of Care (Signed)
Immediate Anesthesia Transfer of Care Note  Patient: Philip Jackson  Procedure(s) Performed: ATRIAL FIBRILLATION ABLATION  Patient Location: Cath Lab  Anesthesia Type:General  Level of Consciousness: awake and alert   Airway & Oxygen Therapy: Patient Spontanous Breathing and Patient connected to nasal cannula oxygen  Post-op Assessment: Report given to RN  Post vital signs: Reviewed and stable  Last Vitals:  Vitals Value Taken Time  BP 110/65 09/15/22 1633  Temp    Pulse 65 09/15/22 1635  Resp 16 09/15/22 1635  SpO2 91 % 09/15/22 1635  Vitals shown include unvalidated device data.  Last Pain:  Vitals:   09/15/22 1304  TempSrc: Temporal  PainSc: 0-No pain         Complications: There were no known notable events for this encounter.

## 2022-09-15 NOTE — Progress Notes (Signed)
Pt ambulated to and from bathroom to void with no signs of oozing form bilateral groin sites 

## 2022-09-16 ENCOUNTER — Other Ambulatory Visit (HOSPITAL_COMMUNITY): Payer: BC Managed Care – PPO

## 2022-09-16 ENCOUNTER — Encounter (HOSPITAL_COMMUNITY): Payer: Self-pay | Admitting: Cardiovascular Disease

## 2022-09-21 ENCOUNTER — Telehealth: Payer: Self-pay | Admitting: Cardiovascular Disease

## 2022-09-21 NOTE — Telephone Encounter (Signed)
Called pt in regards to HR 113.  Reports was working when felt heart speed up to 113 and noticed chest pressure. Rates chest pressure a 2:10 to the left side of chest.  Sat for a little bit and discomfort went away. HR down to 63.   Denies dizziness, SOB. Hasn't checked BP advised to do so when able.  This is the first episode pt has had since ablation.  Advised pt that going into afib this soon after procedure is not uncommon.   Reports takes metoprolol succinate and Eliquis as scheduled without any missed doses.  Advised pt if chest pressure increases or symptoms increase to go to ED for evaluation.  Will send to MD to review.

## 2022-09-21 NOTE — Telephone Encounter (Signed)
Heart rate going up to 113 with exertion -- even if minimal -- and going back down to 63 is probably just sinus rhythm. He needs to let us know if he is definitely having AF though.     Pt had no further concerns.

## 2022-09-21 NOTE — Telephone Encounter (Signed)
New Message:     Patient said he had an ablation last week. He says his heart rate was 113, while he was walking up a little ramp at work. He also had a little painin his chest.     STAT if HR is under 50 or over 120 (normal HR is 60-100 beats per minute)  What is your heart rate? 113 earlier today, now it at 72  Do you have a log of your heart rate readings (document readings)?   Do you have any other symptoms? A little chest pain and tightness when his heart ate was high

## 2022-09-28 DIAGNOSIS — H25812 Combined forms of age-related cataract, left eye: Secondary | ICD-10-CM | POA: Diagnosis not present

## 2022-10-19 DIAGNOSIS — R7303 Prediabetes: Secondary | ICD-10-CM | POA: Diagnosis not present

## 2022-10-19 DIAGNOSIS — E0590 Thyrotoxicosis, unspecified without thyrotoxic crisis or storm: Secondary | ICD-10-CM | POA: Diagnosis not present

## 2022-10-19 DIAGNOSIS — I10 Essential (primary) hypertension: Secondary | ICD-10-CM | POA: Diagnosis not present

## 2022-10-20 ENCOUNTER — Encounter (HOSPITAL_COMMUNITY): Payer: Self-pay | Admitting: Physician Assistant

## 2022-10-20 ENCOUNTER — Ambulatory Visit (HOSPITAL_COMMUNITY)
Admission: RE | Admit: 2022-10-20 | Discharge: 2022-10-20 | Disposition: A | Discharge status: Home / Self Care | Payer: BC Managed Care – PPO | Source: Ambulatory Visit | Attending: Physician Assistant | Admitting: Physician Assistant

## 2022-10-20 VITALS — BP 120/68 | HR 50 | Ht 68.0 in | Wt 194.4 lb

## 2022-10-20 DIAGNOSIS — I2510 Atherosclerotic heart disease of native coronary artery without angina pectoris: Secondary | ICD-10-CM | POA: Insufficient documentation

## 2022-10-20 DIAGNOSIS — I10 Essential (primary) hypertension: Secondary | ICD-10-CM | POA: Diagnosis not present

## 2022-10-20 DIAGNOSIS — Z79899 Other long term (current) drug therapy: Secondary | ICD-10-CM | POA: Insufficient documentation

## 2022-10-20 DIAGNOSIS — Z7901 Long term (current) use of anticoagulants: Secondary | ICD-10-CM | POA: Insufficient documentation

## 2022-10-20 DIAGNOSIS — D6869 Other thrombophilia: Secondary | ICD-10-CM | POA: Diagnosis not present

## 2022-10-20 DIAGNOSIS — R001 Bradycardia, unspecified: Secondary | ICD-10-CM | POA: Insufficient documentation

## 2022-10-20 DIAGNOSIS — I4819 Other persistent atrial fibrillation: Secondary | ICD-10-CM | POA: Diagnosis not present

## 2022-10-20 DIAGNOSIS — G4733 Obstructive sleep apnea (adult) (pediatric): Secondary | ICD-10-CM | POA: Diagnosis not present

## 2022-10-20 DIAGNOSIS — E785 Hyperlipidemia, unspecified: Secondary | ICD-10-CM | POA: Insufficient documentation

## 2022-10-20 NOTE — Progress Notes (Signed)
Primary Care Physician: [facility] Primary Cardiologist: Donato Schultz, MD Electrophysiologist: Lewayne Bunting, MD  Referring Physician: Dr Nelly Laurence   Philip Jackson is a 72 y.o. male with a history of HLD, CAD, HTN, OSA, atrial fibrillation who presents for follow up in the [facility]. The patient was initially diagnosed with atrial fibrillation during a colonoscopy in 2017. He was initially managed with amiodarone but did not tolerate due to side effects. He was seen by Dr Nelly Laurence and underwent afib ablation 09/15/22. Patient is on Eliquis for a CHADS2VASC score of 3.  On follow up today, patient reports that he has done well since his ablation with no symptoms of afib. He states that he has more energy. No chest pain, swallowing pain, or groin issues. No bleeding issues on anticoagulation.   Today, he denies symptoms of palpitations, chest pain, shortness of breath, orthopnea, PND, lower extremity edema, dizziness, presyncope, syncope, snoring, daytime somnolence, bleeding, or neurologic sequela. The patient is tolerating medications without difficulties and is otherwise without complaint today.    Atrial Fibrillation Risk Factors:  he does have symptoms or diagnosis of sleep apnea. he is compliant with CPAP therapy. he does not have a history of rheumatic fever.   Atrial Fibrillation Management history:  Previous antiarrhythmic drugs: amiodarone  Previous cardioversions: 05/06/22 Previous ablations: 09/15/22 Anticoagulation history: Eliquis  ROS- All systems are reviewed and negative except as per the HPI above.   Physical Exam: BP 120/68   Pulse (!) 50   Ht 5\' 8"  (1.727 m)   Wt 88.2 kg   BMI 29.56 kg/m   GEN: Well nourished, well developed in no acute distress NECK: No JVD; No carotid bruits CARDIAC: Regular rate and rhythm, no murmurs, rubs, gallops RESPIRATORY:  Clear to auscultation without rales, wheezing or rhonchi  ABDOMEN: Soft,  non-tender, non-distended EXTREMITIES:  No edema; No deformity   Wt Readings from Last 3 Encounters:  10/20/22 88.2 kg  09/15/22 88.5 kg  07/28/22 90.9 kg     EKG today demonstrates  SB Vent. rate 50 BPM PR interval 172 ms QRS duration 94 ms QT/QTcB 472/430 ms  Echo 01/06/19 demonstrated   1. Left ventricular ejection fraction, by visual estimation, is 60 to  65%. The left ventricle has normal function. Normal left ventricular size.  There is mildly increased left ventricular hypertrophy.   2. Left ventricular diastolic Doppler parameters are consistent with  impaired relaxation pattern of LV diastolic filling.   3. Global right ventricle has moderately reduced systolic function.The  right ventricular size is severely enlarged. No increase in right  ventricular wall thickness.   4. Left atrial size was mild-moderately dilated.   5. Right atrial size was normal.   6. The mitral valve is normal in structure. Mild mitral valve  regurgitation. No evidence of mitral stenosis.   7. The tricuspid valve is normal in structure. Tricuspid valve  regurgitation is mild.   8. The aortic valve is normal in structure. Aortic valve regurgitation  was not visualized by color flow Doppler. Structurally normal aortic  valve, with no evidence of sclerosis or stenosis.   9. The pulmonic valve was normal in structure. Pulmonic valve  regurgitation is mild by color flow Doppler.  10. The inferior vena cava is normal in size with greater than 50%  respiratory variability, suggesting right atrial pressure of 3 mmHg.  11. Since the prior study on 12/26/2018 the right ventricle now has new  severe dilatation and moderate  dysfunction. RVSP appears normal but can be  underestimated secondary to RV dysfunction.    CHA2DS2-VASc Score = 3  The patient's score is based upon: CHF History: 0 HTN History: 1 Diabetes History: 0 Stroke History: 0 Vascular Disease History: 1 Age Score: 1 Gender Score: 0        ASSESSMENT AND PLAN: Persistent Atrial Fibrillation (ICD10:  I48.19) The patient's CHA2DS2-VASc score is 3, indicating a 3.2% annual risk of stroke.   S/p afib ablation 09/15/22 Patient appears to be maintaining SR Continue Eliquis 5 mg BID with no missed doses for 3 months post ablation. Continue Toprol 75 mg BID. Patient is asymptomatic with his bradycardia.  Secondary Hypercoagulable State (ICD10:  D68.69) The patient is at significant risk for stroke/thromboembolism based upon his CHA2DS2-VASc Score of 3.  Continue Apixaban (Eliquis).   CAD CAC score 942  No anginal symptoms  HTN Stable on current regimen   Follow up with Dr Nelly Laurence as scheduled.    Jorja Loa PA-C [facility] [address] [place], [place] [address] [phone]

## 2022-10-25 ENCOUNTER — Other Ambulatory Visit: Payer: Self-pay

## 2022-10-25 MED ORDER — APIXABAN 5 MG PO TABS: 5.0000 mg | ORAL_TABLET | Freq: Two times a day (BID) | ORAL | 11 refills | Status: DC

## 2022-10-25 NOTE — Telephone Encounter (Signed)
Prescription refill request for Eliquis received. Indication:afib Last office visit:7/24 Scr:1.11  5/24 Age: 72 Weight:88.2  kg  Prescription refilled

## 2022-10-27 DIAGNOSIS — H25812 Combined forms of age-related cataract, left eye: Secondary | ICD-10-CM | POA: Diagnosis not present

## 2022-10-27 DIAGNOSIS — H268 Other specified cataract: Secondary | ICD-10-CM | POA: Diagnosis not present

## 2022-11-11 DIAGNOSIS — L570 Actinic keratosis: Secondary | ICD-10-CM | POA: Diagnosis not present

## 2022-11-11 DIAGNOSIS — Z85828 Personal history of other malignant neoplasm of skin: Secondary | ICD-10-CM | POA: Diagnosis not present

## 2022-11-11 DIAGNOSIS — L578 Other skin changes due to chronic exposure to nonionizing radiation: Secondary | ICD-10-CM | POA: Diagnosis not present

## 2022-11-11 DIAGNOSIS — L409 Psoriasis, unspecified: Secondary | ICD-10-CM | POA: Diagnosis not present

## 2022-11-11 DIAGNOSIS — L814 Other melanin hyperpigmentation: Secondary | ICD-10-CM | POA: Diagnosis not present

## 2022-11-11 DIAGNOSIS — L820 Inflamed seborrheic keratosis: Secondary | ICD-10-CM | POA: Diagnosis not present

## 2022-11-12 DIAGNOSIS — N400 Enlarged prostate without lower urinary tract symptoms: Secondary | ICD-10-CM | POA: Diagnosis not present

## 2022-11-22 ENCOUNTER — Encounter: Payer: Self-pay | Admitting: Internal Medicine

## 2022-11-22 ENCOUNTER — Ambulatory Visit: Payer: BC Managed Care – PPO | Admitting: Internal Medicine

## 2022-11-22 VITALS — BP 128/70 | HR 61 | Ht 68.0 in | Wt 198.0 lb

## 2022-11-22 DIAGNOSIS — E0590 Thyrotoxicosis, unspecified without thyrotoxic crisis or storm: Secondary | ICD-10-CM | POA: Diagnosis not present

## 2022-11-22 LAB — TSH: TSH: 4.76 u[IU]/mL (ref 0.35–5.50)

## 2022-11-22 LAB — T4, FREE: Free T4: 0.87 ng/dL (ref 0.60–1.60)
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
## 2022-11-23 ENCOUNTER — Telehealth: Payer: Self-pay | Admitting: Internal Medicine

## 2022-11-23 MED ORDER — METHIMAZOLE 5 MG PO TABS: 5.0000 mg | ORAL_TABLET | Freq: Every day | ORAL | 3 refills | Status: AC

## 2022-11-23 NOTE — Telephone Encounter (Signed)
Please let the patient know that his thyroid test results are within normal range, but we do have room to decrease his methimazole.   He is currently on 10 mg of methimazole daily, please asked the patient to decrease the dose to 5 mg daily.  He may take half a tablet of the current bottle until finished, the new prescription will be 5 mg tablets so he will take 1 tablet daily of the new bottle

## 2022-11-23 NOTE — Telephone Encounter (Signed)
Patient aware and will make medication changes.

## 2022-12-14 DIAGNOSIS — L409 Psoriasis, unspecified: Secondary | ICD-10-CM | POA: Diagnosis not present

## 2022-12-31 ENCOUNTER — Ambulatory Visit: Payer: BC Managed Care – PPO | Admitting: Cardiovascular Disease

## 2023-01-14 ENCOUNTER — Encounter: Payer: Self-pay | Admitting: Cardiovascular Disease

## 2023-01-14 ENCOUNTER — Ambulatory Visit: Payer: BC Managed Care – PPO | Attending: Cardiovascular Disease | Admitting: Cardiovascular Disease

## 2023-01-14 VITALS — BP 112/68 | HR 53 | Ht 68.0 in | Wt 198.0 lb

## 2023-01-14 DIAGNOSIS — I4819 Other persistent atrial fibrillation: Secondary | ICD-10-CM | POA: Diagnosis not present

## 2023-01-14 MED ORDER — METOPROLOL SUCCINATE ER 50 MG PO TB24: 50.0000 mg | ORAL_TABLET | Freq: Every day | ORAL | 3 refills | Status: DC

## 2023-01-14 NOTE — Patient Instructions (Addendum)
Medication Instructions:  Your physician has recommended you make the following change in your medication:  1) DECREASE metoprolol to 50 mg daily   *If you need a refill on your cardiac medications before your next appointment, please call your pharmacy*  Follow-Up: At [facility], you and your health needs are our priority.  As part of our continuing mission to provide you with exceptional heart care, we have created designated Provider Care Teams.  These Care Teams include your primary Cardiologist (physician) and Advanced Practice Providers (APPs -  Physician Assistants and Nurse Practitioners) who all work together to provide you with the care you need, when you need it.  Your next appointment:   6 month(s)  Provider:   You will follow up in the Atrial Fibrillation Clinic located at [facility]. Your provider will be: Clint R. Fenton, PA-C or Lake Bells, PA-C   Follow up with Dr. Nelly Laurence in one year

## 2023-01-14 NOTE — Progress Notes (Signed)
Electrophysiology Office Note:    Date:  01/14/2023   ID:  STEVENS Jackson, DOB June 09, 1950, MRN [id number]  PCP:  [facility]   [facility] Providers Cardiologist:  Donato Schultz, MD Electrophysiologist:  Maurice Small, MD     Referring MD: [facility]   History of Present Illness:    Philip Jackson is a 72 y.o. male with a hx listed below, significant for sleep apnea, SVT, AF, referred for arrhythmia management.  He was diagnosed with atrial fibrillation at the time of colonoscopy a few years. He was paroxysmal and managed with amiodarone. He reports that he did not tolerate the amiodarone well. AF became persistent, so he underwent DCCV in January, 2024. He feels much better now that he is in sinus rhythm -- fatigue and shortness of breath have resolved.  He underwent A-fib ablation in May 2024.  He reports that he has been doing well since.  He has not had any symptoms to suggest recurrence of atrial fibrillation.    EKGs/Labs/Other Studies Reviewed Today:     TTE 06/20/14: Mild LVH, EF 55-60, Gr 1 DD  TTE 12/26/2018: EF 55-60, mild LVH, GR 2 DD, aortic root dilation (3.1 cm), trivial MR, PASP 36, mild LAE TTE 01/06/2019: EF 60-65, mild LVH, GR 1 DD, severe RVE, moderately reduced RVSF, mild to moderate LAE, mild MR, mild TR, RAP 3  EKG:  Last EKG results: today - sinus bradycardia HR 52 bpm   Recent Labs: 09/07/2022: BUN 18; Creatinine, Ser 1.11; Hemoglobin 13.4; Platelets 317; Potassium 3.5; Sodium 141 11/22/2022: TSH 4.76     Physical Exam:    VS:  BP 112/68   Pulse (!) 53   Ht 5\' 8"  (1.727 m)   Wt 198 lb (89.8 kg)   SpO2 97%   BMI 30.11 kg/m     Wt Readings from Last 3 Encounters:  01/14/23 198 lb (89.8 kg)  11/22/22 198 lb (89.8 kg)  10/20/22 194 lb 6.4 oz (88.2 kg)     GEN: Well nourished, well developed in no acute distress CARDIAC: RRR, no murmurs, rubs, gallops RESPIRATORY:  Normal work of breathing MUSCULOSKELETAL: no edema    ASSESSMENT &  PLAN:    Atrial fibrillation:  Persistent symptomatic with fatigue and shortness of breath.  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  Concerns regarding medicines are outlined above.  Orders Placed This Encounter  Procedures   EKG 12-Lead   No orders of the defined types were placed in this encounter.    Signed, Maurice Small, MD  01/14/2023 8:47 AM    [facility]

## 2023-01-24 ENCOUNTER — Other Ambulatory Visit: Payer: Self-pay

## 2023-01-24 MED ORDER — ROSUVASTATIN CALCIUM 20 MG PO TABS: 20.0000 mg | ORAL_TABLET | Freq: Every day | ORAL | 3 refills | Status: DC

## 2023-01-26 DIAGNOSIS — Z79899 Other long term (current) drug therapy: Secondary | ICD-10-CM | POA: Diagnosis not present

## 2023-01-26 DIAGNOSIS — E782 Mixed hyperlipidemia: Secondary | ICD-10-CM | POA: Diagnosis not present

## 2023-01-26 DIAGNOSIS — R7303 Prediabetes: Secondary | ICD-10-CM | POA: Diagnosis not present

## 2023-01-26 DIAGNOSIS — I4891 Unspecified atrial fibrillation: Secondary | ICD-10-CM | POA: Diagnosis not present

## 2023-01-26 DIAGNOSIS — I10 Essential (primary) hypertension: Secondary | ICD-10-CM | POA: Diagnosis not present

## 2023-01-26 DIAGNOSIS — E0590 Thyrotoxicosis, unspecified without thyrotoxic crisis or storm: Secondary | ICD-10-CM | POA: Diagnosis not present

## 2023-02-24 ENCOUNTER — Ambulatory Visit: Payer: BC Managed Care – PPO | Admitting: Internal Medicine

## 2023-06-01 DIAGNOSIS — E559 Vitamin D deficiency, unspecified: Secondary | ICD-10-CM | POA: Diagnosis not present

## 2023-06-01 DIAGNOSIS — I4891 Unspecified atrial fibrillation: Secondary | ICD-10-CM | POA: Diagnosis not present

## 2023-06-01 DIAGNOSIS — I10 Essential (primary) hypertension: Secondary | ICD-10-CM | POA: Diagnosis not present

## 2023-06-01 DIAGNOSIS — E0590 Thyrotoxicosis, unspecified without thyrotoxic crisis or storm: Secondary | ICD-10-CM | POA: Diagnosis not present

## 2023-06-01 DIAGNOSIS — R7303 Prediabetes: Secondary | ICD-10-CM | POA: Diagnosis not present

## 2023-06-03 DIAGNOSIS — H04123 Dry eye syndrome of bilateral lacrimal glands: Secondary | ICD-10-CM | POA: Diagnosis not present

## 2023-06-03 DIAGNOSIS — H35032 Hypertensive retinopathy, left eye: Secondary | ICD-10-CM | POA: Diagnosis not present

## 2023-06-03 DIAGNOSIS — H40013 Open angle with borderline findings, low risk, bilateral: Secondary | ICD-10-CM | POA: Diagnosis not present

## 2023-06-03 DIAGNOSIS — H35361 Drusen (degenerative) of macula, right eye: Secondary | ICD-10-CM | POA: Diagnosis not present
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
## 2023-06-09 ENCOUNTER — Ambulatory Visit: Payer: BC Managed Care – PPO | Attending: Nurse Practitioner | Admitting: Nurse Practitioner

## 2023-06-09 ENCOUNTER — Encounter: Payer: Self-pay | Admitting: Nurse Practitioner

## 2023-06-09 VITALS — BP 124/80 | HR 86 | Ht 68.0 in | Wt 210.8 lb

## 2023-06-09 DIAGNOSIS — E0590 Thyrotoxicosis, unspecified without thyrotoxic crisis or storm: Secondary | ICD-10-CM

## 2023-06-09 DIAGNOSIS — I10 Essential (primary) hypertension: Secondary | ICD-10-CM

## 2023-06-09 DIAGNOSIS — G4733 Obstructive sleep apnea (adult) (pediatric): Secondary | ICD-10-CM

## 2023-06-09 DIAGNOSIS — E785 Hyperlipidemia, unspecified: Secondary | ICD-10-CM

## 2023-06-09 DIAGNOSIS — I4819 Other persistent atrial fibrillation: Secondary | ICD-10-CM

## 2023-06-09 NOTE — Patient Instructions (Signed)
Medication Instructions:  Your physician recommends that you continue on your current medications as directed. Please refer to the Current Medication list given to you today.  *If you need a refill on your cardiac medications before your next appointment, please call your pharmacy*   Lab Work: None ordered  If you have labs (blood work) drawn today and your tests are completely normal, you will receive your results only by: MyChart Message (if you have MyChart) OR A paper copy in the mail If you have any lab test that is abnormal or we need to change your treatment, we will call you to review the results.   Testing/Procedures: None ordered   Follow-Up: At [facility], you and your health needs are our priority.  As part of our continuing mission to provide you with exceptional heart care, we have created designated Provider Care Teams.  These Care Teams include your primary Cardiologist (physician) and Advanced Practice Providers (APPs -  Physician Assistants and Nurse Practitioners) who all work together to provide you with the care you need, when you need it.  We recommend signing up for the patient portal called "MyChart".  Sign up information is provided on this After Visit Summary.  MyChart is used to connect with patients for Virtual Visits (Telemedicine).  Patients are able to view lab/test results, encounter notes, upcoming appointments, etc.  Non-urgent messages can be sent to your provider as well.   To learn more about what you can do with MyChart, go to [email or web address].    Your next appointment:   1 year(s)  Provider:   Jove Skains, MD     Other Instructions   

## 2023-07-14 ENCOUNTER — Ambulatory Visit (HOSPITAL_COMMUNITY)
Admission: RE | Admit: 2023-07-14 | Discharge: 2023-07-14 | Disposition: A | Discharge status: Home / Self Care | Payer: BC Managed Care – PPO | Source: Ambulatory Visit | Attending: Physician Assistant | Admitting: Physician Assistant

## 2023-07-14 VITALS — BP 122/70 | HR 61 | Ht 68.0 in | Wt 205.2 lb

## 2023-07-14 DIAGNOSIS — I2510 Atherosclerotic heart disease of native coronary artery without angina pectoris: Secondary | ICD-10-CM | POA: Insufficient documentation

## 2023-07-14 DIAGNOSIS — I10 Essential (primary) hypertension: Secondary | ICD-10-CM | POA: Diagnosis not present

## 2023-07-14 DIAGNOSIS — E785 Hyperlipidemia, unspecified: Secondary | ICD-10-CM | POA: Insufficient documentation

## 2023-07-14 DIAGNOSIS — D6869 Other thrombophilia: Secondary | ICD-10-CM | POA: Diagnosis not present

## 2023-07-14 DIAGNOSIS — G4733 Obstructive sleep apnea (adult) (pediatric): Secondary | ICD-10-CM | POA: Insufficient documentation

## 2023-07-14 DIAGNOSIS — I4819 Other persistent atrial fibrillation: Secondary | ICD-10-CM | POA: Insufficient documentation

## 2023-07-14 DIAGNOSIS — Z7901 Long term (current) use of anticoagulants: Secondary | ICD-10-CM | POA: Insufficient documentation

## 2023-07-14 NOTE — Progress Notes (Signed)
 Primary Care Physician: [facility] Primary Cardiologist: Donato Schultz, MD Electrophysiologist: Maurice Small, MD  Referring Physician: Dr Nelly Laurence   Philip Jackson is a 73 y.o. male with a history of HLD, CAD, HTN, OSA, atrial fibrillation who presents for follow up in the [facility]. The patient was initially diagnosed with atrial fibrillation during a colonoscopy in 2017. He was initially managed with amiodarone but did not tolerate due to side effects. He was seen by Dr Nelly Laurence and underwent afib ablation 09/15/22. Patient is on Eliquis for stroke prevention.   Patient returns for follow up for atrial fibrillation. He states that he has done well since his last visit. He has not had any interim symptoms of afib, no episodes detected on his smart watch. No bleeding issues on anticoagulation.   Today, he denies symptoms of palpitations, chest pain, shortness of breath, orthopnea, PND, lower extremity edema, dizziness, presyncope, syncope, bleeding, or neurologic sequela. The patient is tolerating medications without difficulties and is otherwise without complaint today.    Atrial Fibrillation Risk Factors:  he does have symptoms or diagnosis of sleep apnea. he does not have a history of rheumatic fever.   Atrial Fibrillation Management history:  Previous antiarrhythmic drugs: amiodarone  Previous cardioversions: 05/06/22 Previous ablations: 09/15/22 Anticoagulation history: Eliquis  ROS- All systems are reviewed and negative except as per the HPI above.   Physical Exam: BP 122/70   Pulse 61   Ht 5\' 8"  (1.727 m)   Wt 93.1 kg   BMI 31.20 kg/m   GEN: Well nourished, well developed in no acute distress CARDIAC: Regular rate and rhythm, no murmurs, rubs, gallops RESPIRATORY:  Clear to auscultation without rales, wheezing or rhonchi  ABDOMEN: Soft, non-tender, non-distended EXTREMITIES:  No edema; No deformity    Wt Readings from Last 3  Encounters:  07/14/23 93.1 kg  06/09/23 95.6 kg  01/14/23 89.8 kg     EKG today demonstrates  SR, slow R wave prog Vent. rate 61 BPM PR interval 176 ms QRS duration 92 ms QT/QTcB 404/406 ms   Echo 01/06/19 demonstrated   1. Left ventricular ejection fraction, by visual estimation, is 60 to  65%. The left ventricle has normal function. Normal left ventricular size.  There is mildly increased left ventricular hypertrophy.   2. Left ventricular diastolic Doppler parameters are consistent with  impaired relaxation pattern of LV diastolic filling.   3. Global right ventricle has moderately reduced systolic function.The  right ventricular size is severely enlarged. No increase in right  ventricular wall thickness.   4. Left atrial size was mild-moderately dilated.   5. Right atrial size was normal.   6. The mitral valve is normal in structure. Mild mitral valve  regurgitation. No evidence of mitral stenosis.   7. The tricuspid valve is normal in structure. Tricuspid valve  regurgitation is mild.   8. The aortic valve is normal in structure. Aortic valve regurgitation  was not visualized by color flow Doppler. Structurally normal aortic  valve, with no evidence of sclerosis or stenosis.   9. The pulmonic valve was normal in structure. Pulmonic valve  regurgitation is mild by color flow Doppler.  10. The inferior vena cava is normal in size with greater than 50%  respiratory variability, suggesting right atrial pressure of 3 mmHg.  11. Since the prior study on 12/26/2018 the right ventricle now has new  severe dilatation and moderate dysfunction. RVSP appears normal but can be  underestimated secondary  to RV dysfunction.    CHA2DS2-VASc Score = 3  The patient's score is based upon: CHF History: 0 HTN History: 1 Diabetes History: 0 Stroke History: 0 Vascular Disease History: 1 Age Score: 1 Gender Score: 0       ASSESSMENT AND PLAN: Persistent Atrial Fibrillation (ICD10:   I48.19) The patient's CHA2DS2-VASc score is 3, indicating a 3.2% annual risk of stroke.   S/p afib ablation 09/15/22 Patient appears to be maintaining SR Continue Eliquis 5 mg BID Continue Toprol 50 mg daily Apple watch for home monitoring   Secondary Hypercoagulable State (ICD10:  D68.69) The patient is at significant risk for stroke/thromboembolism based upon his CHA2DS2-VASc Score of 3.  Continue Apixaban (Eliquis). No bleeding issues.   CAD CAC score 942 No anginal symptoms Followed by Dr Anne Fu  HTN Stable on current regimen  OSA  Encouraged nightly CPAP   Follow up with Dr Nelly Laurence per recall.    Jorja Loa PA-C [facility] [address] [place], [place] [address] [phone]

## 2023-07-26 DIAGNOSIS — L570 Actinic keratosis: Secondary | ICD-10-CM | POA: Diagnosis not present

## 2023-07-26 DIAGNOSIS — L409 Psoriasis, unspecified: Secondary | ICD-10-CM | POA: Diagnosis not present

## 2023-08-29 ENCOUNTER — Other Ambulatory Visit: Payer: Self-pay | Admitting: Physician Assistant

## 2023-08-31 DIAGNOSIS — Z8679 Personal history of other diseases of the circulatory system: Secondary | ICD-10-CM | POA: Diagnosis not present

## 2023-08-31 DIAGNOSIS — I10 Essential (primary) hypertension: Secondary | ICD-10-CM | POA: Diagnosis not present

## 2023-08-31 DIAGNOSIS — Z79899 Other long term (current) drug therapy: Secondary | ICD-10-CM | POA: Diagnosis not present

## 2023-08-31 DIAGNOSIS — E0590 Thyrotoxicosis, unspecified without thyrotoxic crisis or storm: Secondary | ICD-10-CM | POA: Diagnosis not present

## 2023-08-31 DIAGNOSIS — R7303 Prediabetes: Secondary | ICD-10-CM | POA: Diagnosis not present

## 2023-10-31 ENCOUNTER — Other Ambulatory Visit: Payer: Self-pay | Admitting: Physician Assistant

## 2023-11-14 DIAGNOSIS — R338 Other retention of urine: Secondary | ICD-10-CM | POA: Diagnosis not present

## 2023-11-21 DIAGNOSIS — L814 Other melanin hyperpigmentation: Secondary | ICD-10-CM | POA: Diagnosis not present

## 2023-11-21 DIAGNOSIS — L578 Other skin changes due to chronic exposure to nonionizing radiation: Secondary | ICD-10-CM | POA: Diagnosis not present

## 2023-11-21 DIAGNOSIS — L821 Other seborrheic keratosis: Secondary | ICD-10-CM | POA: Diagnosis not present

## 2023-11-21 DIAGNOSIS — L409 Psoriasis, unspecified: Secondary | ICD-10-CM | POA: Diagnosis not present

## 2023-11-21 DIAGNOSIS — L570 Actinic keratosis: Secondary | ICD-10-CM | POA: Diagnosis not present

## 2023-11-21 DIAGNOSIS — D485 Neoplasm of uncertain behavior of skin: Secondary | ICD-10-CM | POA: Diagnosis not present

## 2023-11-21 DIAGNOSIS — C4442 Squamous cell carcinoma of skin of scalp and neck: Secondary | ICD-10-CM | POA: Diagnosis not present

## 2023-12-01 DIAGNOSIS — H40013 Open angle with borderline findings, low risk, bilateral: Secondary | ICD-10-CM | POA: Diagnosis not present

## 2023-12-05 DIAGNOSIS — I10 Essential (primary) hypertension: Secondary | ICD-10-CM | POA: Diagnosis not present

## 2023-12-05 DIAGNOSIS — E0590 Thyrotoxicosis, unspecified without thyrotoxic crisis or storm: Secondary | ICD-10-CM | POA: Diagnosis not present

## 2023-12-05 DIAGNOSIS — Z8679 Personal history of other diseases of the circulatory system: Secondary | ICD-10-CM | POA: Diagnosis not present

## 2023-12-05 DIAGNOSIS — R7303 Prediabetes: Secondary | ICD-10-CM | POA: Diagnosis not present

## 2023-12-07 DIAGNOSIS — C4442 Squamous cell carcinoma of skin of scalp and neck: Secondary | ICD-10-CM | POA: Diagnosis not present

## 2024-01-16 ENCOUNTER — Ambulatory Visit: Attending: Cardiovascular Disease | Admitting: Cardiovascular Disease

## 2024-01-16 ENCOUNTER — Encounter: Payer: Self-pay | Admitting: Cardiovascular Disease

## 2024-01-16 ENCOUNTER — Telehealth: Payer: Self-pay | Admitting: Cardiovascular Disease

## 2024-01-16 VITALS — BP 134/76 | HR 58 | Ht 68.0 in | Wt 205.6 lb

## 2024-01-16 DIAGNOSIS — I471 Supraventricular tachycardia, unspecified: Secondary | ICD-10-CM | POA: Diagnosis not present

## 2024-01-16 MED ORDER — METOPROLOL SUCCINATE ER 50 MG PO TB24: 50.0000 mg | ORAL_TABLET | Freq: Every day | ORAL | 3 refills | Status: AC

## 2024-01-16 NOTE — Progress Notes (Signed)
  Electrophysiology Office Note:    Date:  01/16/2024   ID:  DEV DHONDT, DOB November 17, 1950, MRN [id number]  PCP:  [facility]   [facility] Providers Cardiologist:  Oneil Parchment, MD Electrophysiologist:  Philip FORBES Furbish, MD     Referring MD: [facility]   History of Present Illness:    Philip Jackson is a 73 y.o. male with a hx listed below, significant for sleep apnea, SVT, AF, referred for arrhythmia management.  He was diagnosed with atrial fibrillation at the time of colonoscopy a few years. He was paroxysmal and managed with amiodarone . He reports that he did not tolerate the amiodarone  well. AF became persistent, so he underwent DCCV in January, 2024. He feels much better now that he is in sinus rhythm -- fatigue and shortness of breath have resolved.  He underwent A-fib ablation in May 2024.  He reports that he has been doing well since.  He has not had any symptoms to suggest recurrence of atrial fibrillation.    EKGs/Labs/Other Studies Reviewed Today:     TTE 06/20/14: Mild LVH, EF 55-60, Gr 1 DD  TTE 12/26/2018: EF 55-60, mild LVH, GR 2 DD, aortic root dilation (3.1 cm), trivial MR, PASP 36, mild LAE TTE 01/06/2019: EF 60-65, mild LVH, GR 1 DD, severe RVE, moderately reduced RVSF, mild to moderate LAE, mild MR, mild TR, RAP 3  EKG:  Last EKG results: today - sinus bradycardia HR 52 bpm   Recent Labs: No results found for requested labs within last 365 days.     Physical Exam:    VS:  There were no vitals taken for this visit.    Wt Readings from Last 3 Encounters:  07/14/23 205 lb 3.2 oz (93.1 kg)  06/09/23 210 lb 12.8 oz (95.6 kg)  01/14/23 198 lb (89.8 kg)     GEN: Well nourished, well developed in no acute distress CARDIAC: RRR, no murmurs, rubs, gallops RESPIRATORY:  Normal work of breathing MUSCULOSKELETAL: no edema    ASSESSMENT & PLAN:    Atrial fibrillation:  Persistent symptomatic with fatigue and shortness of breath.  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  Medication Adjustments/Labs and Tests Ordered: Current medicines are reviewed at length with the patient today.  Concerns regarding medicines are outlined above.  Orders Placed This Encounter  Procedures   EKG 12-Lead   No orders of the defined types were placed in this encounter.    Signed, Philip FORBES Furbish, MD  01/16/2024 9:18 AM    [facility]

## 2024-01-16 NOTE — Telephone Encounter (Signed)
 Pt c/o medication issue:  1. Name of Medication:   metoprolol  succinate (TOPROL -XL) 50 MG 24 hr tablet    2. How are you currently taking this medication (dosage and times per day)? As written  3. Are you having a reaction (difficulty breathing--STAT)? no  4. What is your medication issue? Pt would like clarification on med he states that he only takes med in the morning please advise

## 2024-01-16 NOTE — Telephone Encounter (Signed)
 Per  [name] NP's office note on 06/09/23, patient was switched to taking Metoprolol  Succinate 50 mg in the morning only.  Philip Jackson was last seen for follow-up on 07/28/2022 and previously underwent a DCCV on 05/06/2022 not tolerating amiodarone  well.  He was evaluated by Dr. Nancey and underwent AF ablation on 09/15/2022.  He is currently being treated by endocrinology for hyperthyroidism and is on methimazole .  He was last seen by Dr. Nancey on 01/14/2023 was maintaining sinus rhythm with improvement of fatigue and shortness of breath. Metoprolol  was decreased to 50 mg daily.  He was advised to continue CPAP therapy to maintain sinus rhythm.   Patient stated he has been taking medication metoprolol  succinate 50 mg every morning with no issues. Patient would like his medication list updated, since he is not taking 1 1/2 pills twice a day. Will update medication prescription and let Dr. Nancey know.

## 2024-01-16 NOTE — Patient Instructions (Signed)
 Medication Instructions:  Your physician recommends that you continue on your current medications as directed. Please refer to the Current Medication list given to you today.  *If you need a refill on your cardiac medications before your next appointment, please call your pharmacy*  Lab Work: None ordered.  If you have labs (blood work) drawn today and your tests are completely normal, you will receive your results only by: MyChart Message (if you have MyChart) OR A paper copy in the mail If you have any lab test that is abnormal or we need to change your treatment, we will call you to review the results.  Testing/Procedures: None ordered.   Follow-Up: At [facility], you and your health needs are our priority.  As part of our continuing mission to provide you with exceptional heart care, our providers are all part of one team.  This team includes your primary Cardiologist (physician) and Advanced Practice Providers or APPs (Physician Assistants and Nurse Practitioners) who all work together to provide you with the care you need, when you need it.  Your next appointment:   12 months with [facility] Clinic

## 2024-01-23 ENCOUNTER — Other Ambulatory Visit: Payer: Self-pay | Admitting: Cardiovascular Disease

## 2024-02-13 DIAGNOSIS — E0590 Thyrotoxicosis, unspecified without thyrotoxic crisis or storm: Secondary | ICD-10-CM | POA: Diagnosis not present

## 2024-02-13 DIAGNOSIS — Z79899 Other long term (current) drug therapy: Secondary | ICD-10-CM | POA: Diagnosis not present

## 2024-02-13 DIAGNOSIS — R7303 Prediabetes: Secondary | ICD-10-CM | POA: Diagnosis not present

## 2024-02-13 DIAGNOSIS — E782 Mixed hyperlipidemia: Secondary | ICD-10-CM | POA: Diagnosis not present

## 2024-02-13 DIAGNOSIS — I10 Essential (primary) hypertension: Secondary | ICD-10-CM | POA: Diagnosis not present
